# Patient Record
Sex: Male | Born: 1950
Health system: Southern US, Community
[De-identification: ages and names within clinical notes are randomized; demographics above are authoritative.]

## PROBLEM LIST (undated history)

## (undated) DIAGNOSIS — M5416 Radiculopathy, lumbar region: Secondary | ICD-10-CM

## (undated) DIAGNOSIS — G2581 Restless legs syndrome: Secondary | ICD-10-CM

## (undated) DIAGNOSIS — R0600 Dyspnea, unspecified: Secondary | ICD-10-CM

## (undated) DIAGNOSIS — R519 Headache, unspecified: Secondary | ICD-10-CM

## (undated) DIAGNOSIS — R5383 Other fatigue: Secondary | ICD-10-CM

## (undated) DIAGNOSIS — M79659 Pain in unspecified thigh: Secondary | ICD-10-CM

## (undated) DIAGNOSIS — K219 Gastro-esophageal reflux disease without esophagitis: Secondary | ICD-10-CM

## (undated) DIAGNOSIS — J42 Unspecified chronic bronchitis: Secondary | ICD-10-CM

## (undated) DIAGNOSIS — E119 Type 2 diabetes mellitus without complications: Secondary | ICD-10-CM

## (undated) DIAGNOSIS — N419 Inflammatory disease of prostate, unspecified: Secondary | ICD-10-CM

## (undated) DIAGNOSIS — R42 Dizziness and giddiness: Secondary | ICD-10-CM

## (undated) DIAGNOSIS — M7500 Adhesive capsulitis of unspecified shoulder: Secondary | ICD-10-CM

## (undated) DIAGNOSIS — I10 Essential (primary) hypertension: Secondary | ICD-10-CM

## (undated) DIAGNOSIS — J449 Chronic obstructive pulmonary disease, unspecified: Secondary | ICD-10-CM

## (undated) DIAGNOSIS — Z720 Tobacco use: Secondary | ICD-10-CM

## (undated) DIAGNOSIS — C801 Malignant (primary) neoplasm, unspecified: Secondary | ICD-10-CM

## (undated) DIAGNOSIS — S060XAA Concussion with loss of consciousness status unknown, initial encounter: Secondary | ICD-10-CM

## (undated) DIAGNOSIS — S060X9A Concussion with loss of consciousness of unspecified duration, initial encounter: Secondary | ICD-10-CM

## (undated) DIAGNOSIS — J439 Emphysema, unspecified: Secondary | ICD-10-CM

## (undated) DIAGNOSIS — H9190 Unspecified hearing loss, unspecified ear: Secondary | ICD-10-CM

## (undated) DIAGNOSIS — Z972 Presence of dental prosthetic device (complete) (partial): Secondary | ICD-10-CM

## (undated) DIAGNOSIS — M1990 Unspecified osteoarthritis, unspecified site: Secondary | ICD-10-CM

## (undated) DIAGNOSIS — N486 Induration penis plastica: Secondary | ICD-10-CM

## (undated) HISTORY — DX: Unspecified chronic bronchitis: J42

## (undated) HISTORY — DX: Inflammatory disease of prostate, unspecified: N41.9

## (undated) HISTORY — DX: Type 2 diabetes mellitus without complications: E11.9

## (undated) HISTORY — DX: Restless legs syndrome: G25.81

## (undated) HISTORY — DX: Tobacco use: Z72.0

## (undated) HISTORY — DX: Concussion with loss of consciousness status unknown, initial encounter: S06.0XAA

## (undated) HISTORY — DX: Radiculopathy, lumbar region: M54.16

## (undated) HISTORY — DX: Concussion with loss of consciousness of unspecified duration, initial encounter: S06.0X9A

## (undated) HISTORY — DX: Chronic obstructive pulmonary disease, unspecified: J44.9

## (undated) HISTORY — DX: Induration penis plastica: N48.6

## (undated) HISTORY — PX: TONSILLECTOMY: SUR1361

## (undated) HISTORY — DX: Malignant (primary) neoplasm, unspecified: C80.1

## (undated) HISTORY — DX: Pain in unspecified thigh: M79.659

## (undated) HISTORY — PX: COLONOSCOPY: SHX174

## (undated) HISTORY — DX: Gastro-esophageal reflux disease without esophagitis: K21.9

## (undated) HISTORY — PX: EYE SURGERY: SHX253

---

## 1998-12-14 ENCOUNTER — Encounter: Admission: RE | Admit: 1998-12-14 | Discharge: 1998-12-14 | Payer: Self-pay | Admitting: Orthopedic Surgery

## 1998-12-14 ENCOUNTER — Encounter: Payer: Self-pay | Admitting: Orthopedic Surgery

## 1999-10-29 ENCOUNTER — Encounter: Payer: Self-pay | Admitting: Orthopedic Surgery

## 1999-10-29 ENCOUNTER — Encounter: Admission: RE | Admit: 1999-10-29 | Discharge: 1999-10-29 | Payer: Self-pay | Admitting: Orthopedic Surgery

## 2000-12-15 ENCOUNTER — Encounter: Payer: Self-pay | Admitting: *Deleted

## 2000-12-15 ENCOUNTER — Encounter: Admission: RE | Admit: 2000-12-15 | Discharge: 2000-12-15 | Payer: Self-pay | Admitting: *Deleted

## 2003-12-14 ENCOUNTER — Encounter: Admission: RE | Admit: 2003-12-14 | Discharge: 2003-12-14 | Payer: Self-pay | Admitting: Internal Medicine

## 2007-03-17 ENCOUNTER — Encounter: Admission: RE | Admit: 2007-03-17 | Discharge: 2007-03-17 | Payer: Self-pay | Admitting: Internal Medicine

## 2007-06-04 ENCOUNTER — Ambulatory Visit (HOSPITAL_COMMUNITY): Admission: RE | Admit: 2007-06-04 | Discharge: 2007-06-04 | Payer: Self-pay | Admitting: Gastroenterology

## 2007-06-04 ENCOUNTER — Encounter (INDEPENDENT_AMBULATORY_CARE_PROVIDER_SITE_OTHER): Payer: Self-pay | Admitting: Gastroenterology

## 2008-01-15 DIAGNOSIS — Z720 Tobacco use: Secondary | ICD-10-CM

## 2008-01-15 HISTORY — DX: Tobacco use: Z72.0

## 2009-01-14 DIAGNOSIS — N419 Inflammatory disease of prostate, unspecified: Secondary | ICD-10-CM

## 2009-01-14 HISTORY — DX: Inflammatory disease of prostate, unspecified: N41.9

## 2011-01-15 DIAGNOSIS — M5416 Radiculopathy, lumbar region: Secondary | ICD-10-CM

## 2011-01-15 HISTORY — DX: Radiculopathy, lumbar region: M54.16

## 2011-10-15 DIAGNOSIS — M79659 Pain in unspecified thigh: Secondary | ICD-10-CM

## 2011-10-15 HISTORY — DX: Pain in unspecified thigh: M79.659

## 2012-04-24 ENCOUNTER — Other Ambulatory Visit: Payer: Self-pay | Admitting: Orthopaedic Surgery

## 2012-04-24 DIAGNOSIS — M545 Low back pain, unspecified: Secondary | ICD-10-CM

## 2012-05-04 ENCOUNTER — Ambulatory Visit
Admission: RE | Admit: 2012-05-04 | Discharge: 2012-05-04 | Disposition: A | Discharge status: Home / Self Care | Payer: BC Managed Care – PPO | Source: Ambulatory Visit | Attending: Orthopaedic Surgery | Admitting: Orthopaedic Surgery

## 2012-05-04 DIAGNOSIS — M545 Low back pain, unspecified: Secondary | ICD-10-CM

## 2012-05-14 ENCOUNTER — Other Ambulatory Visit: Payer: Self-pay | Admitting: Orthopaedic Surgery

## 2012-05-14 DIAGNOSIS — R9389 Abnormal findings on diagnostic imaging of other specified body structures: Secondary | ICD-10-CM

## 2012-05-15 ENCOUNTER — Other Ambulatory Visit: Payer: Self-pay | Admitting: *Deleted

## 2012-05-15 ENCOUNTER — Encounter: Payer: Self-pay | Admitting: Vascular Surgery

## 2012-05-15 DIAGNOSIS — M79609 Pain in unspecified limb: Secondary | ICD-10-CM

## 2012-05-18 ENCOUNTER — Ambulatory Visit
Admission: RE | Admit: 2012-05-18 | Discharge: 2012-05-18 | Disposition: A | Discharge status: Home / Self Care | Payer: BC Managed Care – PPO | Source: Ambulatory Visit | Attending: Orthopaedic Surgery | Admitting: Orthopaedic Surgery

## 2012-05-18 DIAGNOSIS — R9389 Abnormal findings on diagnostic imaging of other specified body structures: Secondary | ICD-10-CM

## 2012-05-28 ENCOUNTER — Encounter: Payer: BC Managed Care – PPO | Admitting: Vascular Surgery

## 2012-06-26 ENCOUNTER — Encounter: Payer: BC Managed Care – PPO | Admitting: Vascular Surgery

## 2012-07-10 ENCOUNTER — Encounter: Payer: Self-pay | Admitting: Surgery

## 2012-07-13 ENCOUNTER — Encounter (INDEPENDENT_AMBULATORY_CARE_PROVIDER_SITE_OTHER): Payer: BC Managed Care – PPO | Admitting: *Deleted

## 2012-07-13 ENCOUNTER — Ambulatory Visit (INDEPENDENT_AMBULATORY_CARE_PROVIDER_SITE_OTHER): Payer: BC Managed Care – PPO | Admitting: Surgery

## 2012-07-13 ENCOUNTER — Encounter: Payer: Self-pay | Admitting: Surgery

## 2012-07-13 VITALS — BP 135/88 | HR 75 | Resp 16 | Ht 66.0 in | Wt 142.0 lb

## 2012-07-13 DIAGNOSIS — R202 Paresthesia of skin: Secondary | ICD-10-CM

## 2012-07-13 DIAGNOSIS — R209 Unspecified disturbances of skin sensation: Secondary | ICD-10-CM

## 2012-07-13 DIAGNOSIS — I70219 Atherosclerosis of native arteries of extremities with intermittent claudication, unspecified extremity: Secondary | ICD-10-CM | POA: Insufficient documentation

## 2012-07-13 DIAGNOSIS — M79609 Pain in unspecified limb: Secondary | ICD-10-CM | POA: Insufficient documentation

## 2012-07-13 NOTE — Progress Notes (Signed)
[facility] [place]   Patient name: Luis [name] MRN: [id number] DOB: 1950/04/18 Sex: male   Referred by: Dr. Durward Fortes  Reason for referral:  Chief Complaint  Patient presents with  . New Evaluation    Bilateral Thigh pain Right > Left, duration 3 yrs.  Pt. has prussure and tingling bilateral legs, duration 3 yrs.    HISTORY OF PRESENT ILLNESS: The patient comes in today for evaluation of bilateral thigh pain, right greater than left. This has been going on for the past 3 years. The patient states that the pain is worse when he is getting out of a car. It does get better after he walks around. He also complains of bilateral groin pain which comes and goes. He will also get cramps at night. He does not endorse cramps with activity.  The patient does not have open wounds on his legs. He has a history of smoking which has caused emphysema. He quit 3 years ago.  Past Medical History  Diagnosis Date  . Thigh pain     Bilateral Thigh  . COPD (chronic obstructive pulmonary disease)     Past Surgical History  Procedure Laterality Date  . Tonsillectomy      History   Social History  . Marital Status: Married    Spouse Name: N/A    Number of Children: N/A  . Years of Education: N/A   Occupational History  . Not on file.   Social History Main Topics  . Smoking status: Former Smoker    Quit date: 01/14/2009  . Smokeless tobacco: Never Used  . Alcohol Use: Yes  . Drug Use: No  . Sexually Active: Not on file   Other Topics Concern  . Not on file   Social History Narrative  . No narrative on file    Family History  Problem Relation Age of Onset  . Diabetes Mother   . Hyperlipidemia Father   . Hypertension Father     Allergies as of 07/13/2012  . (Not on File)    No current outpatient prescriptions on file prior to visit.   No current facility-administered medications on file prior to visit.     REVIEW OF SYSTEMS: Cardiovascular: No  chest pain, chest pressure, palpitations, orthopnea, or dyspnea on exertion. No claudication or rest pain,  No history of DVT or phlebitis. Pulmonary: No productive cough, asthma or wheezing. Neurologic: No weakness, paresthesias, aphasia, or amaurosis. No dizziness. Hematologic: No bleeding problems or clotting disorders. Musculoskeletal: No joint pain or joint swelling. Gastrointestinal: No blood in stool or hematemesis Genitourinary: No dysuria or hematuria. Psychiatric:: No history of major depression. Integumentary: No rashes or ulcers. Constitutional: No fever or chills.  PHYSICAL EXAMINATION: General: The patient appears their stated age.  Vital signs are BP 135/88  Pulse 75  Resp 16  Ht 5\' 6"  (1.676 m)  Wt 142 lb (64.411 kg)  BMI 22.93 kg/m2  SpO2 97% HEENT:  No gross abnormalities Pulmonary: Respirations are non-labored Abdomen: Soft and non-tender. I do not feel an aortic aneurysm Musculoskeletal: There are no major deformities.   Neurologic: No focal weakness or paresthesias are detected, Skin: There are no ulcer or rashes noted. Psychiatric: The patient has normal affect. Cardiovascular: There is a regular rate and rhythm without significant murmur appreciated. No carotid bruits. Palpable bilateral pedal and bilateral femoral pulses  Diagnostic Studies: ABIs were performed today which showed greater than 1 bilaterally with triphasic waveforms   Assessment:  Bilateral thigh pain  Plan: I discussed with the patient that his symptoms do not correlate with findings from arterial insufficiency. In addition he has palpable pedal pulses as well as ankle-brachial indices which are normal. I told him that I do not feel that his symptoms are related to arterial insufficiency. He will followup with Dr. Durward Fortes to further evaluate etiology of his thigh complaints.     Eldridge Abrahams, M.D. [facility] [place] Office: [phone] Pager:   [phone]

## 2013-02-18 ENCOUNTER — Encounter: Payer: Self-pay | Admitting: Cardiology

## 2014-01-12 ENCOUNTER — Other Ambulatory Visit (INDEPENDENT_AMBULATORY_CARE_PROVIDER_SITE_OTHER): Payer: Self-pay

## 2014-01-12 DIAGNOSIS — G8929 Other chronic pain: Secondary | ICD-10-CM

## 2014-01-12 DIAGNOSIS — R1031 Right lower quadrant pain: Principal | ICD-10-CM

## 2014-01-17 ENCOUNTER — Ambulatory Visit
Admission: RE | Admit: 2014-01-17 | Discharge: 2014-01-17 | Disposition: A | Discharge status: Home / Self Care | Payer: BC Managed Care – PPO | Source: Ambulatory Visit | Attending: Surgery | Admitting: Surgery

## 2014-01-17 DIAGNOSIS — R1031 Right lower quadrant pain: Principal | ICD-10-CM

## 2014-01-17 DIAGNOSIS — G8929 Other chronic pain: Secondary | ICD-10-CM

## 2014-01-17 MED ORDER — IOHEXOL 300 MG/ML  SOLN
100.0000 mL | Freq: Once | INTRAMUSCULAR | Status: AC | PRN
  Administered 2014-01-17: 100 mL via INTRAVENOUS

## 2015-05-24 DIAGNOSIS — S61402A Unspecified open wound of left hand, initial encounter: Secondary | ICD-10-CM | POA: Diagnosis not present

## 2015-05-24 DIAGNOSIS — Z23 Encounter for immunization: Secondary | ICD-10-CM | POA: Diagnosis not present

## 2015-08-31 DIAGNOSIS — Z0000 Encounter for general adult medical examination without abnormal findings: Secondary | ICD-10-CM | POA: Diagnosis not present

## 2015-08-31 DIAGNOSIS — R634 Abnormal weight loss: Secondary | ICD-10-CM | POA: Diagnosis not present

## 2015-08-31 DIAGNOSIS — F17200 Nicotine dependence, unspecified, uncomplicated: Secondary | ICD-10-CM | POA: Diagnosis not present

## 2015-08-31 DIAGNOSIS — R197 Diarrhea, unspecified: Secondary | ICD-10-CM | POA: Diagnosis not present

## 2015-08-31 DIAGNOSIS — M79659 Pain in unspecified thigh: Secondary | ICD-10-CM | POA: Diagnosis not present

## 2015-08-31 DIAGNOSIS — J449 Chronic obstructive pulmonary disease, unspecified: Secondary | ICD-10-CM | POA: Diagnosis not present

## 2015-08-31 DIAGNOSIS — Z125 Encounter for screening for malignant neoplasm of prostate: Secondary | ICD-10-CM | POA: Diagnosis not present

## 2015-08-31 DIAGNOSIS — I739 Peripheral vascular disease, unspecified: Secondary | ICD-10-CM | POA: Diagnosis not present

## 2015-08-31 DIAGNOSIS — Z23 Encounter for immunization: Secondary | ICD-10-CM | POA: Diagnosis not present

## 2016-03-15 DIAGNOSIS — S30861A Insect bite (nonvenomous) of abdominal wall, initial encounter: Secondary | ICD-10-CM | POA: Diagnosis not present

## 2016-03-15 DIAGNOSIS — H9202 Otalgia, left ear: Secondary | ICD-10-CM | POA: Diagnosis not present

## 2016-06-28 DIAGNOSIS — J029 Acute pharyngitis, unspecified: Secondary | ICD-10-CM | POA: Diagnosis not present

## 2016-06-28 DIAGNOSIS — J0100 Acute maxillary sinusitis, unspecified: Secondary | ICD-10-CM | POA: Diagnosis not present

## 2016-07-05 DIAGNOSIS — J441 Chronic obstructive pulmonary disease with (acute) exacerbation: Secondary | ICD-10-CM | POA: Diagnosis not present

## 2016-07-16 ENCOUNTER — Ambulatory Visit
Admission: RE | Admit: 2016-07-16 | Discharge: 2016-07-16 | Disposition: A | Discharge status: Home / Self Care | Payer: BLUE CROSS/BLUE SHIELD | Source: Ambulatory Visit | Attending: Internal Medicine | Admitting: Internal Medicine

## 2016-07-16 ENCOUNTER — Other Ambulatory Visit: Payer: Self-pay | Admitting: Internal Medicine

## 2016-07-16 DIAGNOSIS — R05 Cough: Secondary | ICD-10-CM

## 2016-07-16 DIAGNOSIS — R432 Parageusia: Secondary | ICD-10-CM | POA: Diagnosis not present

## 2016-07-16 DIAGNOSIS — R630 Anorexia: Secondary | ICD-10-CM | POA: Diagnosis not present

## 2016-07-16 DIAGNOSIS — J90 Pleural effusion, not elsewhere classified: Secondary | ICD-10-CM | POA: Diagnosis not present

## 2016-07-16 DIAGNOSIS — R059 Cough, unspecified: Secondary | ICD-10-CM

## 2016-07-16 DIAGNOSIS — R61 Generalized hyperhidrosis: Secondary | ICD-10-CM | POA: Diagnosis not present

## 2016-07-16 DIAGNOSIS — R5383 Other fatigue: Secondary | ICD-10-CM | POA: Diagnosis not present

## 2016-07-16 DIAGNOSIS — R300 Dysuria: Secondary | ICD-10-CM | POA: Diagnosis not present

## 2016-07-16 DIAGNOSIS — R634 Abnormal weight loss: Secondary | ICD-10-CM | POA: Diagnosis not present

## 2016-07-24 ENCOUNTER — Other Ambulatory Visit: Payer: Self-pay | Admitting: Nurse Practitioner

## 2016-07-24 DIAGNOSIS — R634 Abnormal weight loss: Secondary | ICD-10-CM

## 2016-07-24 DIAGNOSIS — R5383 Other fatigue: Secondary | ICD-10-CM | POA: Diagnosis not present

## 2016-07-24 DIAGNOSIS — R630 Anorexia: Secondary | ICD-10-CM | POA: Diagnosis not present

## 2016-08-02 ENCOUNTER — Ambulatory Visit
Admission: RE | Admit: 2016-08-02 | Discharge: 2016-08-02 | Disposition: A | Discharge status: Home / Self Care | Payer: BLUE CROSS/BLUE SHIELD | Source: Ambulatory Visit | Attending: Nurse Practitioner | Admitting: Nurse Practitioner

## 2016-08-02 DIAGNOSIS — R634 Abnormal weight loss: Secondary | ICD-10-CM | POA: Diagnosis not present

## 2017-04-10 DIAGNOSIS — J069 Acute upper respiratory infection, unspecified: Secondary | ICD-10-CM | POA: Diagnosis not present

## 2017-08-19 DIAGNOSIS — Z0000 Encounter for general adult medical examination without abnormal findings: Secondary | ICD-10-CM | POA: Diagnosis not present

## 2017-08-19 DIAGNOSIS — R030 Elevated blood-pressure reading, without diagnosis of hypertension: Secondary | ICD-10-CM | POA: Diagnosis not present

## 2017-12-03 DIAGNOSIS — Z23 Encounter for immunization: Secondary | ICD-10-CM | POA: Diagnosis not present

## 2018-04-06 DIAGNOSIS — Z23 Encounter for immunization: Secondary | ICD-10-CM | POA: Diagnosis not present

## 2018-04-06 DIAGNOSIS — Z136 Encounter for screening for cardiovascular disorders: Secondary | ICD-10-CM | POA: Diagnosis not present

## 2018-04-06 DIAGNOSIS — J449 Chronic obstructive pulmonary disease, unspecified: Secondary | ICD-10-CM | POA: Diagnosis not present

## 2018-04-06 DIAGNOSIS — Z1211 Encounter for screening for malignant neoplasm of colon: Secondary | ICD-10-CM | POA: Diagnosis not present

## 2018-04-06 DIAGNOSIS — R634 Abnormal weight loss: Secondary | ICD-10-CM | POA: Diagnosis not present

## 2018-04-06 DIAGNOSIS — I739 Peripheral vascular disease, unspecified: Secondary | ICD-10-CM | POA: Diagnosis not present

## 2018-04-06 DIAGNOSIS — F17200 Nicotine dependence, unspecified, uncomplicated: Secondary | ICD-10-CM | POA: Diagnosis not present

## 2018-04-06 DIAGNOSIS — G2581 Restless legs syndrome: Secondary | ICD-10-CM | POA: Diagnosis not present

## 2018-04-06 DIAGNOSIS — Z13220 Encounter for screening for lipoid disorders: Secondary | ICD-10-CM | POA: Diagnosis not present

## 2018-04-06 DIAGNOSIS — Z125 Encounter for screening for malignant neoplasm of prostate: Secondary | ICD-10-CM | POA: Diagnosis not present

## 2018-04-06 DIAGNOSIS — Z0000 Encounter for general adult medical examination without abnormal findings: Secondary | ICD-10-CM | POA: Diagnosis not present

## 2018-05-22 DIAGNOSIS — H2513 Age-related nuclear cataract, bilateral: Secondary | ICD-10-CM | POA: Diagnosis not present

## 2018-06-13 DIAGNOSIS — B029 Zoster without complications: Secondary | ICD-10-CM

## 2018-06-13 HISTORY — DX: Zoster without complications: B02.9

## 2018-06-25 DIAGNOSIS — H2511 Age-related nuclear cataract, right eye: Secondary | ICD-10-CM | POA: Diagnosis not present

## 2018-06-25 DIAGNOSIS — J449 Chronic obstructive pulmonary disease, unspecified: Secondary | ICD-10-CM | POA: Diagnosis not present

## 2018-07-02 ENCOUNTER — Other Ambulatory Visit: Payer: Self-pay

## 2018-07-02 NOTE — Discharge Instructions (Signed)

## 2018-07-03 ENCOUNTER — Other Ambulatory Visit: Payer: Self-pay

## 2018-07-03 ENCOUNTER — Other Ambulatory Visit
Admission: RE | Admit: 2018-07-03 | Discharge: 2018-07-03 | Disposition: A | Discharge status: Home / Self Care | Payer: Medicare Other | Source: Ambulatory Visit | Attending: Ophthalmology | Admitting: Ophthalmology

## 2018-07-03 DIAGNOSIS — Z1159 Encounter for screening for other viral diseases: Secondary | ICD-10-CM | POA: Diagnosis not present

## 2018-07-04 LAB — NOVEL CORONAVIRUS, NAA (HOSP ORDER, SEND-OUT TO REF LAB; TAT 18-24 HRS): SARS-CoV-2, NAA: NOT DETECTED

## 2018-07-08 ENCOUNTER — Ambulatory Visit
Admission: RE | Admit: 2018-07-08 | Discharge: 2018-07-08 | Disposition: A | Discharge status: Home / Self Care | Payer: Medicare Other | Attending: Ophthalmology | Admitting: Ophthalmology

## 2018-07-08 ENCOUNTER — Ambulatory Visit: Payer: Medicare Other | Admitting: Anesthesiology

## 2018-07-08 ENCOUNTER — Encounter
Admission: RE | Disposition: A | Discharge status: Home / Self Care | Payer: Self-pay | Source: Home / Self Care | Attending: Ophthalmology

## 2018-07-08 DIAGNOSIS — Z87891 Personal history of nicotine dependence: Secondary | ICD-10-CM | POA: Insufficient documentation

## 2018-07-08 DIAGNOSIS — J439 Emphysema, unspecified: Secondary | ICD-10-CM | POA: Diagnosis not present

## 2018-07-08 DIAGNOSIS — H2511 Age-related nuclear cataract, right eye: Secondary | ICD-10-CM | POA: Insufficient documentation

## 2018-07-08 DIAGNOSIS — H25811 Combined forms of age-related cataract, right eye: Secondary | ICD-10-CM | POA: Diagnosis not present

## 2018-07-08 HISTORY — DX: Headache, unspecified: R51.9

## 2018-07-08 HISTORY — PX: CATARACT EXTRACTION W/PHACO: SHX586

## 2018-07-08 HISTORY — DX: Unspecified osteoarthritis, unspecified site: M19.90

## 2018-07-08 HISTORY — DX: Presence of dental prosthetic device (complete) (partial): Z97.2

## 2018-07-08 SURGERY — PHACOEMULSIFICATION, CATARACT, WITH IOL INSERTION
Anesthesia: Monitor Anesthesia Care | Site: Eye | Laterality: Right

## 2018-07-08 MED ORDER — TETRACAINE HCL 0.5 % OP SOLN
1.0000 [drp] | OPHTHALMIC | Status: DC | PRN
  Administered 2018-07-08 (×3): 1 [drp] via OPHTHALMIC

## 2018-07-08 MED ORDER — MOXIFLOXACIN HCL 0.5 % OP SOLN
1.0000 [drp] | OPHTHALMIC | Status: DC | PRN
  Administered 2018-07-08 (×3): 1 [drp] via OPHTHALMIC

## 2018-07-08 MED ORDER — EPINEPHRINE PF 1 MG/ML IJ SOLN
INTRAOCULAR | Status: DC | PRN
  Administered 2018-07-08: 63 mL via OPHTHALMIC

## 2018-07-08 MED ORDER — CEFUROXIME OPHTHALMIC INJECTION 1 MG/0.1 ML
INJECTION | OPHTHALMIC | Status: DC | PRN
  Administered 2018-07-08: 0.1 mL via INTRACAMERAL

## 2018-07-08 MED ORDER — LACTATED RINGERS IV SOLN: INTRAVENOUS | Status: DC

## 2018-07-08 MED ORDER — NA HYALUR & NA CHOND-NA HYALUR 0.4-0.35 ML IO KIT
PACK | INTRAOCULAR | Status: DC | PRN
  Administered 2018-07-08: 1 mL via INTRAOCULAR

## 2018-07-08 MED ORDER — FENTANYL CITRATE (PF) 100 MCG/2ML IJ SOLN
INTRAMUSCULAR | Status: DC | PRN
  Administered 2018-07-08: 50 ug via INTRAVENOUS

## 2018-07-08 MED ORDER — MIDAZOLAM HCL 2 MG/2ML IJ SOLN
INTRAMUSCULAR | Status: DC | PRN
  Administered 2018-07-08: 2 mg via INTRAVENOUS

## 2018-07-08 MED ORDER — ARMC OPHTHALMIC DILATING DROPS
1.0000 "application " | OPHTHALMIC | Status: DC | PRN
  Administered 2018-07-08 (×3): 1 via OPHTHALMIC

## 2018-07-08 MED ORDER — BRIMONIDINE TARTRATE-TIMOLOL 0.2-0.5 % OP SOLN
OPHTHALMIC | Status: DC | PRN
  Administered 2018-07-08: 1 [drp] via OPHTHALMIC

## 2018-07-08 MED ORDER — LIDOCAINE HCL (PF) 2 % IJ SOLN
INTRAOCULAR | Status: DC | PRN
  Administered 2018-07-08: 2 mL

## 2018-07-08 SURGICAL SUPPLY — 21 items
CANNULA ANT/CHMB 27G (MISCELLANEOUS) ×1 IMPLANT
CANNULA ANT/CHMB 27GA (MISCELLANEOUS) ×2 IMPLANT
GLOVE SURG LX 7.5 STRW (GLOVE) ×1
GLOVE SURG LX STRL 7.5 STRW (GLOVE) ×1 IMPLANT
GLOVE SURG TRIUMPH 8.0 PF LTX (GLOVE) ×2 IMPLANT
GOWN STRL REUS W/ TWL LRG LVL3 (GOWN DISPOSABLE) ×2 IMPLANT
GOWN STRL REUS W/TWL LRG LVL3 (GOWN DISPOSABLE) ×2
LENS IOL ACRSF IQ ULTRA 18.5 (Intraocular Lens) IMPLANT
LENS IOL ACRYSOF IQ 18.5 (Intraocular Lens) ×2 IMPLANT
MARKER SKIN DUAL TIP RULER LAB (MISCELLANEOUS) ×2 IMPLANT
NDL FILTER BLUNT 18X1 1/2 (NEEDLE) ×1 IMPLANT
NEEDLE FILTER BLUNT 18X 1/2SAF (NEEDLE) ×1
NEEDLE FILTER BLUNT 18X1 1/2 (NEEDLE) ×1 IMPLANT
PACK CATARACT BRASINGTON (MISCELLANEOUS) ×2 IMPLANT
PACK EYE AFTER SURG (MISCELLANEOUS) ×2 IMPLANT
PACK OPTHALMIC (MISCELLANEOUS) ×2 IMPLANT
SYR 3ML LL SCALE MARK (SYRINGE) ×2 IMPLANT
SYR 5ML LL (SYRINGE) ×2 IMPLANT
SYR TB 1ML LUER SLIP (SYRINGE) ×2 IMPLANT
WATER STERILE IRR 500ML POUR (IV SOLUTION) ×2 IMPLANT
WIPE NON LINTING 3.25X3.25 (MISCELLANEOUS) ×2 IMPLANT

## 2018-07-08 NOTE — Anesthesia Preprocedure Evaluation (Addendum)
Anesthesia Evaluation  Patient identified by MRN, date of birth, ID band Patient awake    Reviewed: Allergy & Precautions, NPO status   Airway Mallampati: II  TM Distance: >3 FB     Dental   Pulmonary COPD, former smoker,    breath sounds clear to auscultation       Cardiovascular negative cardio ROS   Rhythm:Regular Rate:Normal     Neuro/Psych  Headaches, RLS Hx concussion    GI/Hepatic GERD  ,  Endo/Other    Renal/GU      Musculoskeletal  (+) Arthritis ,   Abdominal   Peds  Hematology   Anesthesia Other Findings   Reproductive/Obstetrics                             Anesthesia Physical Anesthesia Plan  ASA: II  Anesthesia Plan: MAC   Post-op Pain Management:    Induction: Intravenous  PONV Risk Score and Plan:   Airway Management Planned: Natural Airway and Nasal Cannula  Additional Equipment:   Intra-op Plan:   Post-operative Plan:   Informed Consent: I have reviewed the patients History and Physical, chart, labs and discussed the procedure including the risks, benefits and alternatives for the proposed anesthesia with the patient or authorized representative who has indicated his/her understanding and acceptance.       Plan Discussed with: CRNA  Anesthesia Plan Comments:        Anesthesia Quick Evaluation

## 2018-07-08 NOTE — Op Note (Signed)
LOCATION:  [place]   PREOPERATIVE DIAGNOSIS:    Nuclear sclerotic cataract right eye. H25.11   POSTOPERATIVE DIAGNOSIS:  Nuclear sclerotic cataract right eye.     PROCEDURE:  Phacoemusification with posterior chamber intraocular lens placement of the right eye   LENS:   Implant Name Type Inv. Item Serial No. Manufacturer Lot No. LRB No. Used Action  LENS IOL ACRYSOF IQ 18.5 - [id number] Intraocular Lens LENS IOL ACRYSOF IQ 18.5 [id number] ALCON  Right 1 Implanted        ULTRASOUND TIME: 10 % of 0 minutes, 58 seconds.  CDE 5.9   SURGEON:  Wyonia Hough, MD   ANESTHESIA:  Topical with tetracaine drops and 2% Xylocaine jelly, augmented with 1% preservative-free intracameral lidocaine.    COMPLICATIONS:  None.   DESCRIPTION OF PROCEDURE:  The patient was identified in the holding room and transported to the operating room and placed in the supine position under the operating microscope.  The right eye was identified as the operative eye and it was prepped and draped in the usual sterile ophthalmic fashion.   A 1 millimeter clear-corneal paracentesis was made at the 12:00 position.  0.5 ml of preservative-free 1% lidocaine was injected into the anterior chamber. The anterior chamber was filled with Viscoat viscoelastic.  A 2.4 millimeter keratome was used to make a near-clear corneal incision at the 9:00 position.  A curvilinear capsulorrhexis was made with a cystotome and capsulorrhexis forceps.  Balanced salt solution was used to hydrodissect and hydrodelineate the nucleus.   Phacoemulsification was then used in stop and chop fashion to remove the lens nucleus and epinucleus.  The remaining cortex was then removed using the irrigation and aspiration handpiece. Provisc was then placed into the capsular bag to distend it for lens placement.  A lens was then injected into the capsular bag.  The remaining viscoelastic was aspirated.   Wounds were hydrated with balanced  salt solution.  The anterior chamber was inflated to a physiologic pressure with balanced salt solution.  No wound leaks were noted. Cefuroxime 0.1 ml of a 10mg /ml solution was injected into the anterior chamber for a dose of 1 mg of intracameral antibiotic at the completion of the case.   Timolol and Brimonidine drops were applied to the eye.  The patient was taken to the recovery room in stable condition without complications of anesthesia or surgery.   Charise Leinbach 07/08/2018, 10:12 AM

## 2018-07-08 NOTE — H&P (Signed)

## 2018-07-08 NOTE — Transfer of Care (Signed)
Immediate Anesthesia Transfer of Care Note  Patient: Luis [name]  Procedure(s) Performed: CATARACT EXTRACTION PHACO AND INTRAOCULAR LENS PLACEMENT (IOC) RIGHT (Right Eye)  Patient Location: PACU  Anesthesia Type: MAC  Level of Consciousness: awake, alert  and patient cooperative  Airway and Oxygen Therapy: Patient Spontanous Breathing and Patient connected to supplemental oxygen  Post-op Assessment: Post-op Vital signs reviewed, Patient's Cardiovascular Status Stable, Respiratory Function Stable, Patent Airway and No signs of Nausea or vomiting  Post-op Vital Signs: Reviewed and stable  Complications: No apparent anesthesia complications

## 2018-07-08 NOTE — Anesthesia Postprocedure Evaluation (Signed)
Anesthesia Post Note  Patient: Luis [name]  Procedure(s) Performed: CATARACT EXTRACTION PHACO AND INTRAOCULAR LENS PLACEMENT (IOC) RIGHT (Right Eye)  Patient location during evaluation: PACU Anesthesia Type: MAC Level of consciousness: awake and alert Pain management: pain level controlled Vital Signs Assessment: post-procedure vital signs reviewed and stable Respiratory status: spontaneous breathing, nonlabored ventilation, respiratory function stable and patient connected to nasal cannula oxygen Cardiovascular status: stable and blood pressure returned to baseline Postop Assessment: no apparent nausea or vomiting Anesthetic complications: no    Veda Canning

## 2018-07-08 NOTE — Anesthesia Procedure Notes (Signed)
Procedure Name: MAC Performed by: Keyara Ent M, CRNA Pre-anesthesia Checklist: Timeout performed, Patient being monitored, Suction available, Emergency Drugs available and Patient identified Patient Re-evaluated:Patient Re-evaluated prior to induction Oxygen Delivery Method: Nasal cannula       

## 2018-07-09 ENCOUNTER — Encounter: Payer: Self-pay | Admitting: Ophthalmology

## 2018-07-16 DIAGNOSIS — H2512 Age-related nuclear cataract, left eye: Secondary | ICD-10-CM | POA: Diagnosis not present

## 2018-07-21 NOTE — Discharge Instructions (Signed)

## 2018-07-23 ENCOUNTER — Encounter: Payer: Self-pay | Admitting: *Deleted

## 2018-07-23 ENCOUNTER — Other Ambulatory Visit: Payer: Self-pay

## 2018-07-24 ENCOUNTER — Other Ambulatory Visit
Admission: RE | Admit: 2018-07-24 | Discharge: 2018-07-24 | Disposition: A | Discharge status: Home / Self Care | Payer: Medicare Other | Source: Ambulatory Visit | Attending: Ophthalmology | Admitting: Ophthalmology

## 2018-07-24 DIAGNOSIS — Z01812 Encounter for preprocedural laboratory examination: Secondary | ICD-10-CM | POA: Diagnosis not present

## 2018-07-24 DIAGNOSIS — Z1159 Encounter for screening for other viral diseases: Secondary | ICD-10-CM | POA: Insufficient documentation

## 2018-07-25 LAB — SARS CORONAVIRUS 2 (TAT 6-24 HRS): SARS Coronavirus 2: NEGATIVE

## 2018-07-29 ENCOUNTER — Ambulatory Visit
Admission: RE | Admit: 2018-07-29 | Discharge: 2018-07-29 | Disposition: A | Discharge status: Home / Self Care | Payer: Medicare Other | Attending: Ophthalmology | Admitting: Ophthalmology

## 2018-07-29 ENCOUNTER — Ambulatory Visit: Payer: Medicare Other | Admitting: Anesthesiology

## 2018-07-29 ENCOUNTER — Encounter
Admission: RE | Disposition: A | Discharge status: Home / Self Care | Payer: Self-pay | Source: Home / Self Care | Attending: Ophthalmology

## 2018-07-29 ENCOUNTER — Other Ambulatory Visit: Payer: Self-pay

## 2018-07-29 DIAGNOSIS — J439 Emphysema, unspecified: Secondary | ICD-10-CM | POA: Diagnosis not present

## 2018-07-29 DIAGNOSIS — R51 Headache: Secondary | ICD-10-CM | POA: Insufficient documentation

## 2018-07-29 DIAGNOSIS — M1990 Unspecified osteoarthritis, unspecified site: Secondary | ICD-10-CM | POA: Diagnosis not present

## 2018-07-29 DIAGNOSIS — H2512 Age-related nuclear cataract, left eye: Secondary | ICD-10-CM | POA: Insufficient documentation

## 2018-07-29 DIAGNOSIS — K219 Gastro-esophageal reflux disease without esophagitis: Secondary | ICD-10-CM | POA: Insufficient documentation

## 2018-07-29 DIAGNOSIS — Z881 Allergy status to other antibiotic agents status: Secondary | ICD-10-CM | POA: Insufficient documentation

## 2018-07-29 DIAGNOSIS — H25812 Combined forms of age-related cataract, left eye: Secondary | ICD-10-CM | POA: Diagnosis not present

## 2018-07-29 DIAGNOSIS — Z87891 Personal history of nicotine dependence: Secondary | ICD-10-CM | POA: Insufficient documentation

## 2018-07-29 DIAGNOSIS — Z8669 Personal history of other diseases of the nervous system and sense organs: Secondary | ICD-10-CM | POA: Insufficient documentation

## 2018-07-29 HISTORY — PX: CATARACT EXTRACTION W/PHACO: SHX586

## 2018-07-29 SURGERY — PHACOEMULSIFICATION, CATARACT, WITH IOL INSERTION
Anesthesia: Topical | Site: Eye | Laterality: Left

## 2018-07-29 MED ORDER — ARMC OPHTHALMIC DILATING DROPS
1.0000 "application " | OPHTHALMIC | Status: DC | PRN
  Administered 2018-07-29 (×3): 1 via OPHTHALMIC

## 2018-07-29 MED ORDER — LIDOCAINE HCL (PF) 2 % IJ SOLN
INTRAOCULAR | Status: DC | PRN
  Administered 2018-07-29: 2 mL

## 2018-07-29 MED ORDER — MOXIFLOXACIN HCL 0.5 % OP SOLN
1.0000 [drp] | OPHTHALMIC | Status: DC | PRN
  Administered 2018-07-29 (×3): 1 [drp] via OPHTHALMIC

## 2018-07-29 MED ORDER — CEFUROXIME OPHTHALMIC INJECTION 1 MG/0.1 ML
INJECTION | OPHTHALMIC | Status: DC | PRN
  Administered 2018-07-29: 0.1 mL via INTRACAMERAL

## 2018-07-29 MED ORDER — TETRACAINE HCL 0.5 % OP SOLN
1.0000 [drp] | OPHTHALMIC | Status: DC | PRN
  Administered 2018-07-29 (×3): 1 [drp] via OPHTHALMIC

## 2018-07-29 MED ORDER — NA HYALUR & NA CHOND-NA HYALUR 0.4-0.35 ML IO KIT
PACK | INTRAOCULAR | Status: DC | PRN
  Administered 2018-07-29: 1 mL via INTRAOCULAR

## 2018-07-29 MED ORDER — MIDAZOLAM HCL 2 MG/2ML IJ SOLN
INTRAMUSCULAR | Status: DC | PRN
  Administered 2018-07-29: 2 mg via INTRAVENOUS

## 2018-07-29 MED ORDER — BRIMONIDINE TARTRATE-TIMOLOL 0.2-0.5 % OP SOLN
OPHTHALMIC | Status: DC | PRN
  Administered 2018-07-29: 1 [drp] via OPHTHALMIC

## 2018-07-29 MED ORDER — FENTANYL CITRATE (PF) 100 MCG/2ML IJ SOLN
INTRAMUSCULAR | Status: DC | PRN
  Administered 2018-07-29 (×2): 50 ug via INTRAVENOUS

## 2018-07-29 MED ORDER — EPINEPHRINE PF 1 MG/ML IJ SOLN
INTRAOCULAR | Status: DC | PRN
  Administered 2018-07-29: 67 mL via OPHTHALMIC

## 2018-07-29 SURGICAL SUPPLY — 19 items
CANNULA ANT/CHMB 27GA (MISCELLANEOUS) ×2 IMPLANT
GLOVE SURG LX 7.5 STRW (GLOVE) ×1
GLOVE SURG LX STRL 7.5 STRW (GLOVE) ×1 IMPLANT
GLOVE SURG TRIUMPH 8.0 PF LTX (GLOVE) ×2 IMPLANT
GOWN STRL REUS W/ TWL LRG LVL3 (GOWN DISPOSABLE) ×2 IMPLANT
GOWN STRL REUS W/TWL LRG LVL3 (GOWN DISPOSABLE) ×2
LENS IOL ACRSF IQ ULTRA 19.0 (Intraocular Lens) ×1 IMPLANT
LENS IOL ACRYSOF IQ 19.0 (Intraocular Lens) ×2 IMPLANT
MARKER SKIN DUAL TIP RULER LAB (MISCELLANEOUS) ×2 IMPLANT
NEEDLE FILTER BLUNT 18X 1/2SAF (NEEDLE) ×1
NEEDLE FILTER BLUNT 18X1 1/2 (NEEDLE) ×1 IMPLANT
PACK CATARACT BRASINGTON (MISCELLANEOUS) ×2 IMPLANT
PACK EYE AFTER SURG (MISCELLANEOUS) ×2 IMPLANT
PACK OPTHALMIC (MISCELLANEOUS) ×2 IMPLANT
SYR 3ML LL SCALE MARK (SYRINGE) ×2 IMPLANT
SYR 5ML LL (SYRINGE) ×2 IMPLANT
SYR TB 1ML LUER SLIP (SYRINGE) ×2 IMPLANT
WATER STERILE IRR 500ML POUR (IV SOLUTION) ×2 IMPLANT
WIPE NON LINTING 3.25X3.25 (MISCELLANEOUS) ×2 IMPLANT

## 2018-07-29 NOTE — H&P (Signed)

## 2018-07-29 NOTE — Anesthesia Preprocedure Evaluation (Signed)
Anesthesia Evaluation  Patient identified by MRN, date of birth, ID band Patient awake    Reviewed: Allergy & Precautions, H&P , NPO status , Patient's Chart, lab work & pertinent test results  History of Anesthesia Complications Negative for: history of anesthetic complications  Airway Mallampati: II   Neck ROM: full    Dental no notable dental hx.    Pulmonary COPD, former smoker,    Pulmonary exam normal breath sounds clear to auscultation       Cardiovascular negative cardio ROS Normal cardiovascular exam Rhythm:regular Rate:Normal     Neuro/Psych  Headaches,    GI/Hepatic Neg liver ROS, GERD  ,  Endo/Other  negative endocrine ROS  Renal/GU negative Renal ROS  negative genitourinary   Musculoskeletal   Abdominal   Peds  Hematology negative hematology ROS (+)   Anesthesia Other Findings   Reproductive/Obstetrics                             Anesthesia Physical Anesthesia Plan  ASA: II  Anesthesia Plan:    Post-op Pain Management:    Induction:   PONV Risk Score and Plan:   Airway Management Planned:   Additional Equipment:   Intra-op Plan:   Post-operative Plan:   Informed Consent: I have reviewed the patients History and Physical, chart, labs and discussed the procedure including the risks, benefits and alternatives for the proposed anesthesia with the patient or authorized representative who has indicated his/her understanding and acceptance.       Plan Discussed with:   Anesthesia Plan Comments:         Anesthesia Quick Evaluation

## 2018-07-29 NOTE — Op Note (Signed)
LOCATION:  [place]   PREOPERATIVE DIAGNOSIS:  Nuclear sclerotic cataract of the left eye.  H25.12  POSTOPERATIVE DIAGNOSIS:  Nuclear sclerotic cataract of the left eye.   PROCEDURE:  Phacoemulsification with Toric posterior chamber intraocular lens placement of the left eye.   LENS:  Implant Name Type Inv. Item Serial No. Manufacturer Lot No. LRB No. Used Action  LENS IOL ACRYSOF IQ 19.0 - [id number] Intraocular Lens LENS IOL ACRYSOF IQ 19.0 [id number] ALCON  Left 1 Implanted   [id number] 19.0 Toric intraocular lens with 1.5 diopters of cylindrical power with axis orientation at 172 degrees.   ULTRASOUND TIME: 10 % of 0 minutes, 51 seconds.  CDE 5.3   SURGEON:  Wyonia Hough, MD   ANESTHESIA:  Topical with tetracaine drops and 2% Xylocaine jelly, augmented with 1% preservative-free intracameral lidocaine.  COMPLICATIONS:  None.   DESCRIPTION OF PROCEDURE:  The patient was identified in the holding room and transported to the operating suite and placed in the supine position under the operating microscope.  The left eye was identified as the operative eye, and it was prepped and draped in the usual sterile ophthalmic fashion.    A clear-corneal paracentesis incision was made at the 1:30 position.  0.5 ml of preservative-free 1% lidocaine was injected into the anterior chamber. The anterior chamber was filled with Viscoat.  A 2.4 millimeter near clear corneal incision was then made at the 10:30 position.  A cystotome and capsulorrhexis forceps were then used to make a curvilinear capsulorrhexis.  Hydrodissection and hydrodelineation were then performed using balanced salt solution.   Phacoemulsification was then used in stop and chop fashion to remove the lens, nucleus and epinucleus.  The remaining cortex was aspirated using the irrigation and aspiration handpiece.  Provisc viscoelastic was then placed into the capsular bag to distend it for lens placement.  The Verion  digital marker was used to align the implant at the intended axis.   A 19.0 diopter lens was then injected into the capsular bag.  It was rotated clockwise until the axis marks on the lens were approximately 15 degrees in the counterclockwise direction to the intended alignment.  The viscoelastic was aspirated from the eye using the irrigation aspiration handpiece.  Then, a Koch spatula through the sideport incision was used to rotate the lens in a clockwise direction until the axis markings of the intraocular lens were lined up with the Verion alignment.  Balanced salt solution was then used to hydrate the wounds. Cefuroxime 0.1 ml of a 10mg /ml solution was injected into the anterior chamber for a dose of 1 mg of intracameral antibiotic at the completion of the case.    The eye was noted to have a physiologic pressure and there was no wound leak noted.   Timolol and Brimonidine drops were applied to the eye.  The patient was taken to the recovery room in stable condition having had no complications of anesthesia or surgery.  Graceland Wachter 07/29/2018, 1:54 PM

## 2018-07-29 NOTE — Transfer of Care (Signed)
Immediate Anesthesia Transfer of Care Note  Patient: Luis [name]  Procedure(s) Performed: CATARACT EXTRACTION PHACO AND INTRAOCULAR LENS PLACEMENT (IOC) LEFT TORIC LENS (Left Eye)  Patient Location: PACU  Anesthesia Type: No value filed.  Level of Consciousness: awake, alert  and patient cooperative  Airway and Oxygen Therapy: Patient Spontanous Breathing and Patient connected to supplemental oxygen  Post-op Assessment: Post-op Vital signs reviewed, Patient's Cardiovascular Status Stable, Respiratory Function Stable, Patent Airway and No signs of Nausea or vomiting  Post-op Vital Signs: Reviewed and stable  Complications: No apparent anesthesia complications

## 2018-07-29 NOTE — Anesthesia Postprocedure Evaluation (Signed)
Anesthesia Post Note  Patient: Luis [name]  Procedure(s) Performed: CATARACT EXTRACTION PHACO AND INTRAOCULAR LENS PLACEMENT (IOC) LEFT TORIC LENS (Left Eye)  Patient location during evaluation: PACU Anesthesia Type: MAC Level of consciousness: awake and alert Pain management: pain level controlled Vital Signs Assessment: post-procedure vital signs reviewed and stable Respiratory status: spontaneous breathing Cardiovascular status: stable Anesthetic complications: no    Jaci Standard, III,  Jaanvi Fizer D

## 2018-07-30 ENCOUNTER — Encounter: Payer: Self-pay | Admitting: Ophthalmology

## 2018-09-02 DIAGNOSIS — M7502 Adhesive capsulitis of left shoulder: Secondary | ICD-10-CM | POA: Diagnosis not present

## 2018-09-08 DIAGNOSIS — M7502 Adhesive capsulitis of left shoulder: Secondary | ICD-10-CM | POA: Diagnosis not present

## 2018-09-22 DIAGNOSIS — M7502 Adhesive capsulitis of left shoulder: Secondary | ICD-10-CM | POA: Diagnosis not present

## 2018-09-25 DIAGNOSIS — M7502 Adhesive capsulitis of left shoulder: Secondary | ICD-10-CM | POA: Diagnosis not present

## 2018-10-05 DIAGNOSIS — M7502 Adhesive capsulitis of left shoulder: Secondary | ICD-10-CM | POA: Diagnosis not present

## 2018-10-07 DIAGNOSIS — M7502 Adhesive capsulitis of left shoulder: Secondary | ICD-10-CM | POA: Diagnosis not present

## 2018-10-12 DIAGNOSIS — M7502 Adhesive capsulitis of left shoulder: Secondary | ICD-10-CM | POA: Diagnosis not present

## 2018-10-14 DIAGNOSIS — M7502 Adhesive capsulitis of left shoulder: Secondary | ICD-10-CM | POA: Diagnosis not present

## 2018-11-09 DIAGNOSIS — Z23 Encounter for immunization: Secondary | ICD-10-CM | POA: Diagnosis not present

## 2019-04-01 DIAGNOSIS — Z1211 Encounter for screening for malignant neoplasm of colon: Secondary | ICD-10-CM | POA: Diagnosis not present

## 2019-04-01 DIAGNOSIS — R109 Unspecified abdominal pain: Secondary | ICD-10-CM | POA: Diagnosis not present

## 2019-04-01 DIAGNOSIS — R634 Abnormal weight loss: Secondary | ICD-10-CM | POA: Diagnosis not present

## 2019-04-01 DIAGNOSIS — Z79899 Other long term (current) drug therapy: Secondary | ICD-10-CM | POA: Diagnosis not present

## 2019-04-01 DIAGNOSIS — R972 Elevated prostate specific antigen [PSA]: Secondary | ICD-10-CM | POA: Diagnosis not present

## 2019-04-01 DIAGNOSIS — R05 Cough: Secondary | ICD-10-CM | POA: Diagnosis not present

## 2019-04-01 DIAGNOSIS — J029 Acute pharyngitis, unspecified: Secondary | ICD-10-CM | POA: Diagnosis not present

## 2019-04-01 DIAGNOSIS — J449 Chronic obstructive pulmonary disease, unspecified: Secondary | ICD-10-CM | POA: Diagnosis not present

## 2019-04-01 DIAGNOSIS — R519 Headache, unspecified: Secondary | ICD-10-CM | POA: Diagnosis not present

## 2019-04-01 DIAGNOSIS — I739 Peripheral vascular disease, unspecified: Secondary | ICD-10-CM | POA: Diagnosis not present

## 2019-04-01 DIAGNOSIS — F17201 Nicotine dependence, unspecified, in remission: Secondary | ICD-10-CM | POA: Diagnosis not present

## 2019-04-15 DIAGNOSIS — C801 Malignant (primary) neoplasm, unspecified: Secondary | ICD-10-CM

## 2019-04-15 HISTORY — DX: Malignant (primary) neoplasm, unspecified: C80.1

## 2019-04-21 ENCOUNTER — Other Ambulatory Visit: Payer: Self-pay | Admitting: Internal Medicine

## 2019-04-21 DIAGNOSIS — R519 Headache, unspecified: Secondary | ICD-10-CM

## 2019-04-21 DIAGNOSIS — F17201 Nicotine dependence, unspecified, in remission: Secondary | ICD-10-CM

## 2019-04-21 DIAGNOSIS — G44009 Cluster headache syndrome, unspecified, not intractable: Secondary | ICD-10-CM

## 2019-04-21 DIAGNOSIS — G8929 Other chronic pain: Secondary | ICD-10-CM

## 2019-04-26 ENCOUNTER — Ambulatory Visit
Admission: RE | Admit: 2019-04-26 | Discharge: 2019-04-26 | Disposition: A | Discharge status: Home / Self Care | Payer: Medicare Other | Source: Ambulatory Visit | Attending: Internal Medicine | Admitting: Internal Medicine

## 2019-04-26 DIAGNOSIS — R972 Elevated prostate specific antigen [PSA]: Secondary | ICD-10-CM | POA: Diagnosis not present

## 2019-04-26 DIAGNOSIS — R519 Headache, unspecified: Secondary | ICD-10-CM

## 2019-04-26 DIAGNOSIS — Z0001 Encounter for general adult medical examination with abnormal findings: Secondary | ICD-10-CM | POA: Diagnosis not present

## 2019-04-26 DIAGNOSIS — I739 Peripheral vascular disease, unspecified: Secondary | ICD-10-CM | POA: Diagnosis not present

## 2019-04-26 DIAGNOSIS — R634 Abnormal weight loss: Secondary | ICD-10-CM | POA: Diagnosis not present

## 2019-04-26 DIAGNOSIS — G8929 Other chronic pain: Secondary | ICD-10-CM

## 2019-04-26 DIAGNOSIS — R05 Cough: Secondary | ICD-10-CM | POA: Diagnosis not present

## 2019-04-26 DIAGNOSIS — Z1211 Encounter for screening for malignant neoplasm of colon: Secondary | ICD-10-CM | POA: Diagnosis not present

## 2019-04-26 DIAGNOSIS — Z1389 Encounter for screening for other disorder: Secondary | ICD-10-CM | POA: Diagnosis not present

## 2019-04-26 DIAGNOSIS — Z8042 Family history of malignant neoplasm of prostate: Secondary | ICD-10-CM | POA: Diagnosis not present

## 2019-04-26 DIAGNOSIS — Z87891 Personal history of nicotine dependence: Secondary | ICD-10-CM | POA: Diagnosis not present

## 2019-04-26 DIAGNOSIS — J449 Chronic obstructive pulmonary disease, unspecified: Secondary | ICD-10-CM | POA: Diagnosis not present

## 2019-04-26 DIAGNOSIS — F17201 Nicotine dependence, unspecified, in remission: Secondary | ICD-10-CM

## 2019-05-03 DIAGNOSIS — R634 Abnormal weight loss: Secondary | ICD-10-CM | POA: Diagnosis not present

## 2019-05-03 DIAGNOSIS — J449 Chronic obstructive pulmonary disease, unspecified: Secondary | ICD-10-CM | POA: Diagnosis not present

## 2019-05-03 DIAGNOSIS — Z8042 Family history of malignant neoplasm of prostate: Secondary | ICD-10-CM | POA: Diagnosis not present

## 2019-05-03 DIAGNOSIS — R05 Cough: Secondary | ICD-10-CM | POA: Diagnosis not present

## 2019-05-03 DIAGNOSIS — I700 Atherosclerosis of aorta: Secondary | ICD-10-CM | POA: Diagnosis not present

## 2019-05-03 DIAGNOSIS — R972 Elevated prostate specific antigen [PSA]: Secondary | ICD-10-CM | POA: Diagnosis not present

## 2019-05-03 DIAGNOSIS — R911 Solitary pulmonary nodule: Secondary | ICD-10-CM | POA: Diagnosis not present

## 2019-05-03 DIAGNOSIS — R519 Headache, unspecified: Secondary | ICD-10-CM | POA: Diagnosis not present

## 2019-05-03 DIAGNOSIS — F17201 Nicotine dependence, unspecified, in remission: Secondary | ICD-10-CM | POA: Diagnosis not present

## 2019-05-05 ENCOUNTER — Ambulatory Visit: Payer: Medicare Other

## 2019-05-06 ENCOUNTER — Other Ambulatory Visit: Payer: Self-pay

## 2019-05-06 ENCOUNTER — Ambulatory Visit (INDEPENDENT_AMBULATORY_CARE_PROVIDER_SITE_OTHER): Payer: Medicare Other | Admitting: Internal Medicine

## 2019-05-06 ENCOUNTER — Encounter: Payer: Self-pay | Admitting: Internal Medicine

## 2019-05-06 ENCOUNTER — Institutional Professional Consult (permissible substitution): Payer: Medicare Other | Admitting: Internal Medicine

## 2019-05-06 VITALS — BP 134/80 | HR 69 | Temp 98.5°F | Ht 66.0 in | Wt 133.6 lb

## 2019-05-06 DIAGNOSIS — J449 Chronic obstructive pulmonary disease, unspecified: Secondary | ICD-10-CM | POA: Diagnosis not present

## 2019-05-06 DIAGNOSIS — R911 Solitary pulmonary nodule: Secondary | ICD-10-CM | POA: Diagnosis not present

## 2019-05-06 MED ORDER — STIOLTO RESPIMAT 2.5-2.5 MCG/ACT IN AERS: 2.0000 | INHALATION_SPRAY | Freq: Every day | RESPIRATORY_TRACT | 0 refills | Status: DC

## 2019-05-06 MED ORDER — STIOLTO RESPIMAT 2.5-2.5 MCG/ACT IN AERS: 2.0000 | INHALATION_SPRAY | Freq: Every day | RESPIRATORY_TRACT | 11 refills | Status: DC

## 2019-05-06 NOTE — Patient Instructions (Addendum)
Stiolto 2 puffs each am - first thing in am - think of it like  high octane fuel   PFTs as soon as we can schedule them - take the stiolto the am of the test   We will call to schedule a PET scan and see if your need lung surgery ([place]) .

## 2019-05-06 NOTE — Assessment & Plan Note (Addendum)
Quit smoking 2011 -  See LDSCT  04/26/19 :  RUL  10.5 spiculated is suspicious > PET ordered   CT results reviewed with pt >>> Too small for PET or bx, not suspicious enough for excisional bx > really only option for now is follow the Fleischner society guidelines as rec by radiology>  Either 3 m f/u or PET now with the latter not as sensitive at the smaller size and false negatives a concern/ reviewed.  Discussed in detail all the  indications, usual  risks and alternatives  relative to the benefits with patient who agrees to proceed with w/u with PET first but in meanwhile work on his copd to improve his chances of being a surgical candidate if need be          Each maintenance medication was reviewed in detail including emphasizing most importantly the difference between maintenance and prns and under what circumstances the prns are to be triggered using an action plan format where appropriate.  Total time for H and P, chart review, counseling, teaching device and generating customized AVS unique to this office visit / charting = 60 min

## 2019-05-06 NOTE — Progress Notes (Signed)
Luis [name], male    DOB: 1950-05-01, 68 y.o.   MRN: [id number]   Brief patient profile:  23 yowm quit smoking 2011 with doe then and about the same since on advair helped some then quit due to [facility] and back to baseline doe since but referred to PUlmonary clinic in [place] as first avail consult slot at request of Dr Mertha Finders for new SPN on LCSCT / expedited w/u req by pt's wife who did not come to ov with pt per Covid 19 restrictions.      History of Present Illness  05/06/2019  Pulmonary/ 1st office eval/Tige Meas  Chief Complaint  Patient presents with  . Pulmonary Consult    Referred by Dr. [name] for eval of pulmonary nodules.   Dyspnea:  MMRC2 = can't walk a nl pace on a flat grade s sob but does fine slow and flat / lifting is a problem / stops at top of steps to catch breath Cough: rattles each am white mucus , never bloody  Sleep: flat/ on back one pillow SABA use: none   No obvious day to day or daytime variability or assoc  purulent sputum or mucus plugs or hemoptysis or cp or chest tightness, subjective wheeze or overt sinus or hb symptoms.   Sleeping fine flat without nocturnal    exacerbation  of respiratory  c/o's or need for noct saba. Also denies any obvious fluctuation of symptoms with weather or environmental changes or other aggravating or alleviating factors except as outlined above   No unusual exposure hx or h/o childhood pna/ asthma or knowledge of premature birth.  Current Allergies, Complete Past Medical History, Past Surgical History, Family History, and Social History were reviewed in [employer] record.  ROS  The following are not active complaints unless bolded Hoarseness, sore throat, dysphagia, dental problems, itching, sneezing,  nasal congestion or discharge of excess mucus or purulent secretions, ear ache,   fever, chills, sweats, unintended wt loss or wt gain, classically pleuritic or exertional cp,  orthopnea  pnd or arm/hand swelling  or leg swelling, presyncope, palpitations, abdominal pain, anorexia, nausea, vomiting, diarrhea  or change in bowel habits or change in bladder habits, change in stools or change in urine, dysuria, hematuria,  rash, arthralgias, visual complaints, headache, numbness, weakness or ataxia or problems with walking or coordination,  change in mood or  memory.           Past Medical History:  Diagnosis Date  . Arthritis    through out body  . Chronic bronchitis ([place])   . Concussion   . COPD (chronic obstructive pulmonary disease) ([place])   . GERD (gastroesophageal reflux disease)   . Headache    alot of days, related to spine issues  . Lumbar radiculopathy 2013  . Peyronie's disease   . Prostatitis 2011   Dr. Gaynelle Arabian  . RLS (restless legs syndrome)   . Shingles 06/13/2018   shingles on back, finished with prednisone, stills has scabs and pain  . Thigh pain    Bilateral Thigh  . Thigh pain 10/13   bilateral  . Tobacco abuse 2010   still uses electronic cig/ emphysema, SOB easily  . Wears partial dentures    upper    Outpatient Medications Prior to Visit  Medication Sig Dispense Refill  . Cholecalciferol (VITAMIN D-3) 5000 UNIT/ML LIQD Place under the tongue.    . vitamin B-12 (CYANOCOBALAMIN) 500 MCG tablet Take 500 mcg by mouth daily.    [name]  Multiple Vitamin (MULTIVITAMIN) tablet Take 1 tablet by mouth daily.           Objective:     BP 134/80 (BP Location: Left Arm, Cuff Size: Normal)   Pulse 69   Temp 98.5 F (36.9 C) (Temporal)   Ht 5\' 6"  (1.676 m)   Wt 133 lb 9.6 oz (60.6 kg)   SpO2 97% Comment: on RA  BMI 21.56 kg/m   SpO2: 97 %(on RA)    HEENT : pt wearing mask not removed for exam due to covid -19 concerns.    NECK :  without JVD/Nodes/TM/ nl carotid upstrokes bilaterally   LUNGS: no acc muscle use,  Mod barrel  contour chest wall with bilateral  Distant bs s audible wheeze and  without cough on insp or exp maneuvers and mod   Hyperresonant  to  percussion bilaterally     CV:  RRR  no s3 or murmur or increase in P2, and no edema   ABD:  soft and nontender with pos mid insp Hoover's  in the supine position. No bruits or organomegaly appreciated, bowel sounds nl  MS:     ext warm without deformities, calf tenderness, cyanosis or clubbing No obvious joint restrictions   SKIN: warm and dry without lesions    NEURO:  alert, approp, nl sensorium with  no motor or cerebellar deficits apparent.        I personally reviewed images and agree with radiology impression as follows:   Chest LDSCT   04/26/19 1. Spiculated right upper lobe nodule measures at least 10.5 mm. Lung-RADS 4A, suspicious. Follow up low-dose chest CT without contrast in 3 months (please use the following order, "CT CHEST LCS NODULE FOLLOW-UP W/O CM") is recommended. Alternatively, PET may be considered when there is a solid component 69mm or larger. These results will be called to the ordering clinician or representative by the Radiologist Assistant, and communication documented in the PACS or [employer]. 2. Aortic atherosclerosis (ICD10-I70.0). Coronary artery calcification. 3.  Emphysema (ICD10-J43.9).      Assessment   COPD GOLD ? / Group B  Stopped smoking 2011  - 05/06/2019  After extensive coaching inhaler device,  effectiveness =    90% with smi > start stiolto 2 puffs each am   Pt is Group B in terms of symptom/risk and laba/lama therefore appropriate rx at this point >>>  stiolto or bevespi best options since already has a chronic cough and dpi's might aggravate this.    Needs pfts while on stiolto to see if eligible for RULobectomy if indicated.    Discussed stiolto being the equivalent of high octane fuel - needs to keep filling the tank daily, not prn  Advised:  formulary restrictions will be an ongoing challenge for the forseable future and I would be happy to pick an alternative if the pt will first  provide me a  list of them -  pt  will need to return here for training for any new device that is required eg dpi vs hfa vs respimat.    In the meantime we can always provide samples so that the patient never runs out of any needed respiratory medications.       Solitary pulmonary nodule on lung CT Quit smoking 2011 -  See LDSCT  04/26/19 :  RUL  10.5 spiculated is suspicious > PET ordered   CT results reviewed with pt >>> Too small for PET or bx, not suspicious enough for excisional  bx > really only option for now is follow the Fleischner society guidelines as rec by radiology>  Either 3 m f/u or PET now with the latter not as sensitive at the smaller size and false negatives a concern/ reviewed.  Discussed in detail all the  indications, usual  risks and alternatives  relative to the benefits with patient who agrees to proceed with w/u with PET first but in meanwhile work on his copd to improve his chances of being a surgical candidate if need be          Each maintenance medication was reviewed in detail including emphasizing most importantly the difference between maintenance and prns and under what circumstances the prns are to be triggered using an action plan format where appropriate.  Total time for H and P, chart review, counseling, teaching device and generating customized AVS unique to this office visit / charting = 60 min              Christinia Gully, MD 05/06/2019

## 2019-05-06 NOTE — Assessment & Plan Note (Addendum)
Stopped smoking 2011  - 05/06/2019  After extensive coaching inhaler device,  effectiveness =    90% with smi > start stiolto 2 puffs each am   Pt is Group B in terms of symptom/risk and laba/lama therefore appropriate rx at this point >>>  stiolto or bevespi best options since already has a chronic cough and dpi's might aggravate this.    Needs pfts while on stiolto to see if eligible for RULobectomy if indicated.    Discussed stiolto being the equivalent of high octane fuel - needs to keep filling the tank daily, not prn  Advised:  formulary restrictions will be an ongoing challenge for the forseable future and I would be happy to pick an alternative if the pt will first  provide me a list of them -  pt  will need to return here for training for any new device that is required eg dpi vs hfa vs respimat.    In the meantime we can always provide samples so that the patient never runs out of any needed respiratory medications.

## 2019-05-10 ENCOUNTER — Telehealth: Payer: Self-pay | Admitting: Internal Medicine

## 2019-05-10 DIAGNOSIS — R911 Solitary pulmonary nodule: Secondary | ICD-10-CM

## 2019-05-10 NOTE — Telephone Encounter (Signed)
Called and spoke with patient and his wife and told them that the order had been placed and that the [facility] would call them to get it scheduled. Expressed understanding. Nothing further needed at this time.

## 2019-05-17 ENCOUNTER — Other Ambulatory Visit: Payer: Self-pay | Admitting: Internal Medicine

## 2019-05-17 ENCOUNTER — Other Ambulatory Visit: Payer: Self-pay

## 2019-05-17 ENCOUNTER — Ambulatory Visit
Admission: RE | Admit: 2019-05-17 | Discharge: 2019-05-17 | Disposition: A | Discharge status: Home / Self Care | Payer: Medicare Other | Source: Ambulatory Visit | Attending: Internal Medicine | Admitting: Internal Medicine

## 2019-05-17 DIAGNOSIS — I2510 Atherosclerotic heart disease of native coronary artery without angina pectoris: Secondary | ICD-10-CM | POA: Diagnosis not present

## 2019-05-17 DIAGNOSIS — R911 Solitary pulmonary nodule: Secondary | ICD-10-CM | POA: Diagnosis not present

## 2019-05-17 DIAGNOSIS — K5730 Diverticulosis of large intestine without perforation or abscess without bleeding: Secondary | ICD-10-CM | POA: Diagnosis not present

## 2019-05-17 DIAGNOSIS — N400 Enlarged prostate without lower urinary tract symptoms: Secondary | ICD-10-CM | POA: Diagnosis not present

## 2019-05-17 DIAGNOSIS — R918 Other nonspecific abnormal finding of lung field: Secondary | ICD-10-CM | POA: Diagnosis not present

## 2019-05-17 DIAGNOSIS — I700 Atherosclerosis of aorta: Secondary | ICD-10-CM | POA: Insufficient documentation

## 2019-05-17 DIAGNOSIS — J439 Emphysema, unspecified: Secondary | ICD-10-CM | POA: Diagnosis not present

## 2019-05-17 LAB — GLUCOSE, CAPILLARY: Glucose-Capillary: 89 mg/dL (ref 70–99)

## 2019-05-17 MED ORDER — FLUDEOXYGLUCOSE F - 18 (FDG) INJECTION
6.9000 | Freq: Once | INTRAVENOUS | Status: AC | PRN
  Administered 2019-05-17: 7.45 via INTRAVENOUS

## 2019-05-18 ENCOUNTER — Other Ambulatory Visit (HOSPITAL_COMMUNITY)
Admission: RE | Admit: 2019-05-18 | Discharge: 2019-05-18 | Disposition: A | Discharge status: Home / Self Care | Payer: Medicare Other | Source: Ambulatory Visit | Attending: Internal Medicine | Admitting: Internal Medicine

## 2019-05-18 DIAGNOSIS — Z01812 Encounter for preprocedural laboratory examination: Secondary | ICD-10-CM | POA: Insufficient documentation

## 2019-05-18 DIAGNOSIS — Z20822 Contact with and (suspected) exposure to covid-19: Secondary | ICD-10-CM | POA: Insufficient documentation

## 2019-05-18 LAB — SARS CORONAVIRUS 2 (TAT 6-24 HRS): SARS Coronavirus 2: NEGATIVE

## 2019-05-21 ENCOUNTER — Ambulatory Visit (INDEPENDENT_AMBULATORY_CARE_PROVIDER_SITE_OTHER): Payer: Medicare Other | Admitting: Internal Medicine

## 2019-05-21 ENCOUNTER — Other Ambulatory Visit: Payer: Self-pay

## 2019-05-21 DIAGNOSIS — J449 Chronic obstructive pulmonary disease, unspecified: Secondary | ICD-10-CM

## 2019-05-21 LAB — PULMONARY FUNCTION TEST
DL/VA % pred: 100 %
DL/VA: 4.19 ml/min/mmHg/L
DLCO cor % pred: 104 %
DLCO cor: 24.31 ml/min/mmHg
DLCO unc % pred: 104 %
DLCO unc: 24.31 ml/min/mmHg
FEF 25-75 Post: 0.77 L/sec
FEF 25-75 Pre: 0.47 L/sec
FEF2575-%Change-Post: 63 %
FEF2575-%Pred-Post: 33 %
FEF2575-%Pred-Pre: 20 %
FEV1-%Change-Post: 28 %
FEV1-%Pred-Post: 43 %
FEV1-%Pred-Pre: 34 %
FEV1-Post: 1.26 L
FEV1-Pre: 0.98 L
FEV1FVC-%Change-Post: 7 %
FEV1FVC-%Pred-Pre: 52 %
FEV6-%Change-Post: 23 %
FEV6-%Pred-Post: 81 %
FEV6-%Pred-Pre: 66 %
FEV6-Post: 2.99 L
FEV6-Pre: 2.42 L
FEV6FVC-%Change-Post: 3 %
FEV6FVC-%Pred-Post: 105 %
FEV6FVC-%Pred-Pre: 102 %
FVC-%Change-Post: 19 %
FVC-%Pred-Post: 77 %
FVC-%Pred-Pre: 64 %
FVC-Post: 3 L
FVC-Pre: 2.52 L
Post FEV1/FVC ratio: 42 %
Post FEV6/FVC ratio: 100 %
Pre FEV1/FVC ratio: 39 %
Pre FEV6/FVC Ratio: 96 %
RV % pred: 226 %
RV: 4.91 L
TLC % pred: 133 %
TLC: 8.3 L

## 2019-05-21 NOTE — Progress Notes (Signed)
Full PFT performed today. °

## 2019-05-24 ENCOUNTER — Encounter: Payer: Self-pay | Admitting: Thoracic Surgery (Cardiothoracic Vascular Surgery)

## 2019-05-25 ENCOUNTER — Other Ambulatory Visit: Payer: Self-pay

## 2019-05-25 ENCOUNTER — Encounter: Payer: Self-pay | Admitting: Thoracic Surgery (Cardiothoracic Vascular Surgery)

## 2019-05-25 ENCOUNTER — Institutional Professional Consult (permissible substitution) (INDEPENDENT_AMBULATORY_CARE_PROVIDER_SITE_OTHER): Payer: Medicare Other | Admitting: Thoracic Surgery (Cardiothoracic Vascular Surgery)

## 2019-05-25 VITALS — BP 170/89 | HR 67 | Temp 97.9°F | Resp 20 | Ht 66.0 in | Wt 133.0 lb

## 2019-05-25 DIAGNOSIS — R911 Solitary pulmonary nodule: Secondary | ICD-10-CM | POA: Diagnosis not present

## 2019-05-25 NOTE — H&P (View-Only) (Signed)
PCP is [name], MD Referring Provider is Tanda Rockers, MD  Chief Complaint  Patient presents with  . Lung Lesion    Surgical eval, PET Scan 05/17/19, Chest CT 04/26/19, PFT's 05/21/19    HPI: Luis [name] is sent for consultation regarding a right upper lobe lung nodule.  Luis [name] is a 69 year old man with a past medical history significant for tobacco abuse, COPD, chronic bronchitis, reflux, lumbar radiculopathy, restless legs, and arthritis.  He recently saw Dr. Inda Merlin.  A low-dose CT scan was ordered for lung cancer screening due to his history of tobacco abuse.  It showed a spiculated right upper lobe lung nodule.  He was referred to Dr. Melvyn Novas.  A PET/CT showed the nodule was hypermetabolic with an SUV of 6.4  He quit smoking in 2011.  He smoked 2 packs/day for 40 years prior to that.  He denies any chest pain, pressure, or tightness at rest or with exertion.  He does have some reflux.  He has a cough which is occasionally productive of clear sputum.  He denies wheezing.  He complained about the pricing of Stiolto and is unable to afford that medication.  He has chronic headaches which have not changed recently.  He does get short of breath with exertion.  He is a little difficult to pin down exactly but can walk up a flight of stairs without stopping.  He can walk a block on level ground without stopping.  Zubrod Score: At the time of surgery this patient's most appropriate activity status/level should be described as: []     0    Normal activity, no symptoms []     1    Restricted in physical strenuous activity but ambulatory, able to do out light work [x]     2    Ambulatory and capable of self care, unable to do work activities, up and about >50 % of waking hours                              []     3    Only limited self care, in bed greater than 50% of waking hours []     4    Completely disabled, no self care, confined to bed or chair []     5    Moribund  Past Medical History:   Diagnosis Date  . Arthritis    through out body  . Chronic bronchitis ([place])   . Concussion   . COPD (chronic obstructive pulmonary disease) ([place])   . GERD (gastroesophageal reflux disease)   . Headache    alot of days, related to spine issues  . Lumbar radiculopathy 2013  . Peyronie's disease   . Prostatitis 2011   Dr. Gaynelle Arabian  . RLS (restless legs syndrome)   . Shingles 06/13/2018   shingles on back, finished with prednisone, stills has scabs and pain  . Thigh pain    Bilateral Thigh  . Thigh pain 10/13   bilateral  . Tobacco abuse 2010   still uses electronic cig/ emphysema, SOB easily  . Wears partial dentures    upper    Past Surgical History:  Procedure Laterality Date  . CATARACT EXTRACTION W/PHACO Right 07/08/2018   Procedure: CATARACT EXTRACTION PHACO AND INTRAOCULAR LENS PLACEMENT ([place]) RIGHT;  Surgeon: Leandrew Koyanagi, MD;  Location: [facility];  Service: Ophthalmology;  Laterality: Right;  . CATARACT EXTRACTION W/PHACO Left 07/29/2018   Procedure: CATARACT EXTRACTION  PHACO AND INTRAOCULAR LENS PLACEMENT ([place]) LEFT TORIC LENS;  Surgeon: Leandrew Koyanagi, MD;  Location: [place] [facility];  Service: Ophthalmology;  Laterality: Left;  . COLONOSCOPY    . TONSILLECTOMY      Family History  Problem Relation Age of Onset  . Diabetes Mother   . Hyperlipidemia Father   . Hypertension Father     Social History Social History   Tobacco Use  . Smoking status: Former Smoker    Packs/day: 2.00    Years: 40.00    Pack years: 80.00    Types: Cigarettes    Quit date: 01/14/2009    Years since quitting: 10.3  . Smokeless tobacco: Former Network engineer Use Topics  . Alcohol use: Yes    Alcohol/week: 12.0 standard drinks    Types: 12 Cans of beer per week  . Drug use: No    Current Outpatient Medications  Medication Sig Dispense Refill  . Cholecalciferol (VITAMIN D-3) 5000 UNIT/ML LIQD Place under the tongue.    . Tiotropium  Bromide-Olodaterol (STIOLTO RESPIMAT) 2.5-2.5 MCG/ACT AERS Inhale 2 puffs into the lungs daily. 4 g 11  . vitamin B-12 (CYANOCOBALAMIN) 500 MCG tablet Take 500 mcg by mouth daily.     No current facility-administered medications for this visit.    Allergies  Allergen Reactions  . Doxycycline Itching and Other (See Comments)    Itching/ redness    Review of Systems  Constitutional: Negative for activity change, appetite change, chills, fever and unexpected weight change.  HENT: Negative for trouble swallowing and voice change.   Eyes: Negative for visual disturbance.  Respiratory: Positive for cough and shortness of breath. Negative for wheezing.   Cardiovascular: Negative for chest pain and leg swelling.  Gastrointestinal: Positive for abdominal pain (Reflux).  Genitourinary: Positive for frequency and urgency.  Musculoskeletal: Positive for arthralgias and joint swelling.       Leg cramps  Neurological: Positive for headaches. Negative for syncope and weakness.  Hematological: Negative for adenopathy. Bruises/bleeds easily (On arms).  All other systems reviewed and are negative.   BP (!) 170/89   Pulse 67   Temp 97.9 F (36.6 C) (Skin)   Resp 20   Ht 5\' 6"  (1.676 m)   Wt 133 lb (60.3 kg)   SpO2 98% Comment: RA  BMI 21.47 kg/m  Physical Exam Vitals reviewed.  Constitutional:      General: He is not in acute distress.    Appearance: Normal appearance.  HENT:     Head: Normocephalic and atraumatic.  Eyes:     General: No scleral icterus.    Extraocular Movements: Extraocular movements intact.  Neck:     Vascular: No carotid bruit.  Cardiovascular:     Rate and Rhythm: Normal rate and regular rhythm.     Heart sounds: Normal heart sounds. No murmur. No friction rub. No gallop.   Pulmonary:     Effort: No respiratory distress.     Breath sounds: No wheezing or rales.     Comments: Diminished breath sounds bilaterally Abdominal:     General: There is no distension.      Palpations: Abdomen is soft.     Tenderness: There is no abdominal tenderness.  Musculoskeletal:     Cervical back: Neck supple.  Lymphadenopathy:     Cervical: No cervical adenopathy.  Skin:    General: Skin is warm and dry.  Neurological:     General: No focal deficit present.     Mental Status: He  is alert and oriented to person, place, and time.     Cranial Nerves: No cranial nerve deficit.     Motor: No weakness.    Diagnostic Tests: CT CHEST WITHOUT CONTRAST LOW-DOSE FOR LUNG CANCER SCREENING  TECHNIQUE: Multidetector CT imaging of the chest was performed following the standard protocol without IV contrast.  COMPARISON:  None.  FINDINGS: Cardiovascular: Atherosclerotic calcification of the aorta and coronary arteries. Heart size normal. No pericardial effusion.  Mediastinum/Nodes: No pathologically enlarged mediastinal or axillary lymph nodes. Hilar regions are difficult to definitively evaluate without IV contrast but appear grossly unremarkable. Esophagus is unremarkable.  Lungs/Pleura: Centrilobular emphysema. There is a spiculated nodule in right upper lobe which measures at least 10.5 mm (3/97). A non solid nodule in the posterior left upper lobe measures 8.6 mm (3/70). No pleural fluid. Airway is unremarkable.  Upper Abdomen: Low-attenuation lesion in the dome of the liver measures 1.3 cm and is likely a cyst. Visualized portions of the liver, gallbladder and adrenal glands are otherwise unremarkable. Low-attenuation lesions in the kidneys measure up to 3.3 cm on the right, incompletely imaged. Visualized portions of the spleen, pancreas, stomach and bowel are grossly unremarkable.  Musculoskeletal: No worrisome lytic or sclerotic lesions.  IMPRESSION: 1. Spiculated right upper lobe nodule measures at least 10.5 mm. Lung-RADS 4A, suspicious. Follow up low-dose chest CT without contrast in 3 months (please use the following order, "CT CHEST  LCS NODULE FOLLOW-UP W/O CM") is recommended. Alternatively, PET may be considered when there is a solid component 35mm or larger. These results will be called to the ordering clinician or representative by the Radiologist Assistant, and communication documented in the PACS or [employer]. 2. Aortic atherosclerosis (ICD10-I70.0). Coronary artery calcification. 3.  Emphysema (ICD10-J43.9).   Electronically Signed   By: Luis Picket M.D.   On: 04/26/2019 13:17 NUCLEAR MEDICINE PET SKULL BASE TO THIGH  TECHNIQUE: 7.5 mCi F-18 FDG was injected intravenously. Full-ring PET imaging was performed from the skull base to thigh after the radiotracer. CT data was obtained and used for attenuation correction and anatomic localization.  Fasting blood glucose: 89 mg/dl  COMPARISON:  04/26/2019 screening chest CT.  FINDINGS: Mediastinal blood pool activity: SUV max 1.9  Liver activity: SUV max NA  NECK: No hypermetabolic lymph nodes in the neck.  Incidental CT findings: none  CHEST:  Hypermetabolic spiculated irregular 2.1 x 1.2 cm right upper lobe solid pulmonary nodule (series 3/image 92) with max SUV 6.4. No additional hypermetabolic pulmonary findings. Posterior left upper lobe indistinct 0.8 cm subsolid nodule along the major fissure (series 3/image 77) is not associated with significant FDG uptake (max SUV 0.7).  No enlarged or hypermetabolic axillary, mediastinal or hilar lymph nodes.  Incidental CT findings: Moderate centrilobular and paraseptal emphysema. No acute consolidative airspace disease. Left anterior descending coronary atherosclerosis. Mildly atherosclerotic nonaneurysmal thoracic aorta.  ABDOMEN/PELVIS: No abnormal hypermetabolic activity within the liver, pancreas, adrenal glands, or spleen. No hypermetabolic lymph nodes in the abdomen or pelvis.  Incidental CT findings: Simple 1.0 cm left liver dome cyst. Granulomatous splenic  calcifications. Atherosclerotic nonaneurysmal abdominal aorta. Simple bilateral renal cysts, largest 4.6 cm in the medial lower right kidney. Mildly enlarged prostate. Moderate sigmoid diverticulosis.  SKELETON: No focal hypermetabolic activity to suggest skeletal metastasis.  Incidental CT findings: none  IMPRESSION: 1. Hypermetabolic spiculated irregular 2.1 x 1.2 cm right upper lobe solid pulmonary nodule, compatible with primary bronchogenic carcinoma. 2. No hypermetabolic thoracic adenopathy or distant metastatic disease. 3. PET-CT stage IA (  T1c N0 M0). 4. Posterior left upper lobe indistinct 0.8 cm subsolid pulmonary nodule along the major fissure, without significant FDG uptake, warranting surveillance on future follow-up chest CT studies. 5. Chronic findings include: Aortic Atherosclerosis (ICD10-I70.0) and Emphysema (ICD10-J43.9). Coronary atherosclerosis. Moderate sigmoid diverticulosis. Mild prostatomegaly.   Electronically Signed   By: Luis Sorrel M.D.   On: 05/17/2019 12:50 I personally reviewed the CT and PET/CT images and concur with the findings noted above  Pulmonary function testing 05/21/2019 FVC 2.52 (64%)  FEV1 0.98 (34%) FEV1 1.26 (43%) postbronchodilator TLC 8.30 (133%) RV 4.91 (226%) DLCO 24.31 (104%)  Impression: Luis [name] is a 69 year old man with a past medical history significant for tobacco abuse, COPD, chronic bronchitis, reflux, lumbar radiculopathy, restless legs, and arthritis.  He has an 80-pack-year history of smoking prior to quitting 10 years ago.  He recently was found to have a spiculated right upper lobe lung nodule on a low-dose CT for lung cancer screening.  On PET CT there is a 2.1 x 1.2 cm nodule with a SUV of 6.4.  This most likely is a new primary bronchogenic carcinoma.  Given his age, smoking history, appearance of the nodule, and PET findings this has to be considered lung cancer unless it can be proven otherwise.  We  discussed the possibility of attempting to biopsy the nodule bronchoscopically.  A positive biopsy would be an indication for resection.  A negative biopsy would not rule out the possibility of cancer to the possibility of sampling error so would not preclude the need for surgical resection.  We discussed the options of treating the nodule surgically or with radiation.  We discussed the relative advantages and disadvantages of those 2 approaches.  He strongly prefers surgical resection.  His pulmonary function testing is acceptable for right upper lobectomy.  I recommended we proceed with robotic right upper lobectomy for definitive diagnosis and treatment.  I described the proposed operation to Mr. and Luis [name].  They understand the general nature of the procedure including the incisions to be used, the amount of lung to be resected, the need for general anesthesia, the use of drains to postoperatively, the expected hospital stay, and the overall recovery.  I informed him of the indications, risks, benefits, and alternatives.  They understand the risks include, but not limited to death, MI, DVT, PE, bleeding, possible need for transfusion, infection, prolonged air leak, cardiac arrhythmias, neuropathic pain, as well as possibility of other unforeseeable complications.  He accepts the risk and wishes to proceed with surgery.  COPD-he has been using Stiolto but cannot afford the medication.  I recommended he contact Dr. Gustavus Bryant office to discuss alternatives.  Left upper lobe lung nodule-too small to characterize.  Will need continued follow-up.  I discussed this issue with Mr. and Luis [name].  Plan: Robotic right upper lobectomy on Thursday, 06/17/2019  [name], MD [facility] [phone]

## 2019-05-25 NOTE — Progress Notes (Signed)
PCP is [name], MD Referring Provider is Tanda Rockers, MD  Chief Complaint  Patient presents with  . Lung Lesion    Surgical eval, PET Scan 05/17/19, Chest CT 04/26/19, PFT's 05/21/19    HPI: Mr. Cullens is sent for consultation regarding a right upper lobe lung nodule.  Levert Heslop is a 69 year old man with a past medical history significant for tobacco abuse, COPD, chronic bronchitis, reflux, lumbar radiculopathy, restless legs, and arthritis.  He recently saw Dr. Inda Merlin.  A low-dose CT scan was ordered for lung cancer screening due to his history of tobacco abuse.  It showed a spiculated right upper lobe lung nodule.  He was referred to Dr. Melvyn Novas.  A PET/CT showed the nodule was hypermetabolic with an SUV of 6.4  He quit smoking in 2011.  He smoked 2 packs/day for 40 years prior to that.  He denies any chest pain, pressure, or tightness at rest or with exertion.  He does have some reflux.  He has a cough which is occasionally productive of clear sputum.  He denies wheezing.  He complained about the pricing of Stiolto and is unable to afford that medication.  He has chronic headaches which have not changed recently.  He does get short of breath with exertion.  He is a little difficult to pin down exactly but can walk up a flight of stairs without stopping.  He can walk a block on level ground without stopping.  Zubrod Score: At the time of surgery this patient's most appropriate activity status/level should be described as: []     0    Normal activity, no symptoms []     1    Restricted in physical strenuous activity but ambulatory, able to do out light work [x]     2    Ambulatory and capable of self care, unable to do work activities, up and about >50 % of waking hours                              []     3    Only limited self care, in bed greater than 50% of waking hours []     4    Completely disabled, no self care, confined to bed or chair []     5    Moribund  Past Medical History:   Diagnosis Date  . Arthritis    through out body  . Chronic bronchitis ([place])   . Concussion   . COPD (chronic obstructive pulmonary disease) ([place])   . GERD (gastroesophageal reflux disease)   . Headache    alot of days, related to spine issues  . Lumbar radiculopathy 2013  . Peyronie's disease   . Prostatitis 2011   Dr. Gaynelle Arabian  . RLS (restless legs syndrome)   . Shingles 06/13/2018   shingles on back, finished with prednisone, stills has scabs and pain  . Thigh pain    Bilateral Thigh  . Thigh pain 10/13   bilateral  . Tobacco abuse 2010   still uses electronic cig/ emphysema, SOB easily  . Wears partial dentures    upper    Past Surgical History:  Procedure Laterality Date  . CATARACT EXTRACTION W/PHACO Right 07/08/2018   Procedure: CATARACT EXTRACTION PHACO AND INTRAOCULAR LENS PLACEMENT ([place]) RIGHT;  Surgeon: Leandrew Koyanagi, MD;  Location: [place];  Service: Ophthalmology;  Laterality: Right;  . CATARACT EXTRACTION W/PHACO Left 07/29/2018   Procedure: CATARACT EXTRACTION  PHACO AND INTRAOCULAR LENS PLACEMENT ([place]) LEFT TORIC LENS;  Surgeon: Leandrew Koyanagi, MD;  Location: [place];  Service: Ophthalmology;  Laterality: Left;  . COLONOSCOPY    . TONSILLECTOMY      Family History  Problem Relation Age of Onset  . Diabetes Mother   . Hyperlipidemia Father   . Hypertension Father     Social History Social History   Tobacco Use  . Smoking status: Former Smoker    Packs/day: 2.00    Years: 40.00    Pack years: 80.00    Types: Cigarettes    Quit date: 01/14/2009    Years since quitting: 10.3  . Smokeless tobacco: Former Network engineer Use Topics  . Alcohol use: Yes    Alcohol/week: 12.0 standard drinks    Types: 12 Cans of beer per week  . Drug use: No    Current Outpatient Medications  Medication Sig Dispense Refill  . Cholecalciferol (VITAMIN D-3) 5000 UNIT/ML LIQD Place under the tongue.    . Tiotropium  Bromide-Olodaterol (STIOLTO RESPIMAT) 2.5-2.5 MCG/ACT AERS Inhale 2 puffs into the lungs daily. 4 g 11  . vitamin B-12 (CYANOCOBALAMIN) 500 MCG tablet Take 500 mcg by mouth daily.     No current facility-administered medications for this visit.    Allergies  Allergen Reactions  . Doxycycline Itching and Other (See Comments)    Itching/ redness    Review of Systems  Constitutional: Negative for activity change, appetite change, chills, fever and unexpected weight change.  HENT: Negative for trouble swallowing and voice change.   Eyes: Negative for visual disturbance.  Respiratory: Positive for cough and shortness of breath. Negative for wheezing.   Cardiovascular: Negative for chest pain and leg swelling.  Gastrointestinal: Positive for abdominal pain (Reflux).  Genitourinary: Positive for frequency and urgency.  Musculoskeletal: Positive for arthralgias and joint swelling.       Leg cramps  Neurological: Positive for headaches. Negative for syncope and weakness.  Hematological: Negative for adenopathy. Bruises/bleeds easily (On arms).  All other systems reviewed and are negative.   BP (!) 170/89   Pulse 67   Temp 97.9 F (36.6 C) (Skin)   Resp 20   Ht 5\' 6"  (1.676 m)   Wt 133 lb (60.3 kg)   SpO2 98% Comment: RA  BMI 21.47 kg/m  Physical Exam Vitals reviewed.  Constitutional:      General: He is not in acute distress.    Appearance: Normal appearance.  HENT:     Head: Normocephalic and atraumatic.  Eyes:     General: No scleral icterus.    Extraocular Movements: Extraocular movements intact.  Neck:     Vascular: No carotid bruit.  Cardiovascular:     Rate and Rhythm: Normal rate and regular rhythm.     Heart sounds: Normal heart sounds. No murmur. No friction rub. No gallop.   Pulmonary:     Effort: No respiratory distress.     Breath sounds: No wheezing or rales.     Comments: Diminished breath sounds bilaterally Abdominal:     General: There is no distension.      Palpations: Abdomen is soft.     Tenderness: There is no abdominal tenderness.  Musculoskeletal:     Cervical back: Neck supple.  Lymphadenopathy:     Cervical: No cervical adenopathy.  Skin:    General: Skin is warm and dry.  Neurological:     General: No focal deficit present.     Mental Status: He  is alert and oriented to person, place, and time.     Cranial Nerves: No cranial nerve deficit.     Motor: No weakness.    Diagnostic Tests: CT CHEST WITHOUT CONTRAST LOW-DOSE FOR LUNG CANCER SCREENING  TECHNIQUE: Multidetector CT imaging of the chest was performed following the standard protocol without IV contrast.  COMPARISON:  None.  FINDINGS: Cardiovascular: Atherosclerotic calcification of the aorta and coronary arteries. Heart size normal. No pericardial effusion.  Mediastinum/Nodes: No pathologically enlarged mediastinal or axillary lymph nodes. Hilar regions are difficult to definitively evaluate without IV contrast but appear grossly unremarkable. Esophagus is unremarkable.  Lungs/Pleura: Centrilobular emphysema. There is a spiculated nodule in right upper lobe which measures at least 10.5 mm (3/97). A non solid nodule in the posterior left upper lobe measures 8.6 mm (3/70). No pleural fluid. Airway is unremarkable.  Upper Abdomen: Low-attenuation lesion in the dome of the liver measures 1.3 cm and is likely a cyst. Visualized portions of the liver, gallbladder and adrenal glands are otherwise unremarkable. Low-attenuation lesions in the kidneys measure up to 3.3 cm on the right, incompletely imaged. Visualized portions of the spleen, pancreas, stomach and bowel are grossly unremarkable.  Musculoskeletal: No worrisome lytic or sclerotic lesions.  IMPRESSION: 1. Spiculated right upper lobe nodule measures at least 10.5 mm. Lung-RADS 4A, suspicious. Follow up low-dose chest CT without contrast in 3 months (please use the following order, "CT CHEST  LCS NODULE FOLLOW-UP W/O CM") is recommended. Alternatively, PET may be considered when there is a solid component 14mm or larger. These results will be called to the ordering clinician or representative by the Radiologist Assistant, and communication documented in the PACS or [employer]. 2. Aortic atherosclerosis (ICD10-I70.0). Coronary artery calcification. 3.  Emphysema (ICD10-J43.9).   Electronically Signed   By: Lorin Picket M.D.   On: 04/26/2019 13:17 NUCLEAR MEDICINE PET SKULL BASE TO THIGH  TECHNIQUE: 7.5 mCi F-18 FDG was injected intravenously. Full-ring PET imaging was performed from the skull base to thigh after the radiotracer. CT data was obtained and used for attenuation correction and anatomic localization.  Fasting blood glucose: 89 mg/dl  COMPARISON:  04/26/2019 screening chest CT.  FINDINGS: Mediastinal blood pool activity: SUV max 1.9  Liver activity: SUV max NA  NECK: No hypermetabolic lymph nodes in the neck.  Incidental CT findings: none  CHEST:  Hypermetabolic spiculated irregular 2.1 x 1.2 cm right upper lobe solid pulmonary nodule (series 3/image 92) with max SUV 6.4. No additional hypermetabolic pulmonary findings. Posterior left upper lobe indistinct 0.8 cm subsolid nodule along the major fissure (series 3/image 77) is not associated with significant FDG uptake (max SUV 0.7).  No enlarged or hypermetabolic axillary, mediastinal or hilar lymph nodes.  Incidental CT findings: Moderate centrilobular and paraseptal emphysema. No acute consolidative airspace disease. Left anterior descending coronary atherosclerosis. Mildly atherosclerotic nonaneurysmal thoracic aorta.  ABDOMEN/PELVIS: No abnormal hypermetabolic activity within the liver, pancreas, adrenal glands, or spleen. No hypermetabolic lymph nodes in the abdomen or pelvis.  Incidental CT findings: Simple 1.0 cm left liver dome cyst. Granulomatous splenic  calcifications. Atherosclerotic nonaneurysmal abdominal aorta. Simple bilateral renal cysts, largest 4.6 cm in the medial lower right kidney. Mildly enlarged prostate. Moderate sigmoid diverticulosis.  SKELETON: No focal hypermetabolic activity to suggest skeletal metastasis.  Incidental CT findings: none  IMPRESSION: 1. Hypermetabolic spiculated irregular 2.1 x 1.2 cm right upper lobe solid pulmonary nodule, compatible with primary bronchogenic carcinoma. 2. No hypermetabolic thoracic adenopathy or distant metastatic disease. 3. PET-CT stage IA (  T1c N0 M0). 4. Posterior left upper lobe indistinct 0.8 cm subsolid pulmonary nodule along the major fissure, without significant FDG uptake, warranting surveillance on future follow-up chest CT studies. 5. Chronic findings include: Aortic Atherosclerosis (ICD10-I70.0) and Emphysema (ICD10-J43.9). Coronary atherosclerosis. Moderate sigmoid diverticulosis. Mild prostatomegaly.   Electronically Signed   By: Ilona Sorrel M.D.   On: 05/17/2019 12:50 I personally reviewed the CT and PET/CT images and concur with the findings noted above  Pulmonary function testing 05/21/2019 FVC 2.52 (64%)  FEV1 0.98 (34%) FEV1 1.26 (43%) postbronchodilator TLC 8.30 (133%) RV 4.91 (226%) DLCO 24.31 (104%)  Impression: Darcell Sabino is a 69 year old man with a past medical history significant for tobacco abuse, COPD, chronic bronchitis, reflux, lumbar radiculopathy, restless legs, and arthritis.  He has an 80-pack-year history of smoking prior to quitting 10 years ago.  He recently was found to have a spiculated right upper lobe lung nodule on a low-dose CT for lung cancer screening.  On PET CT there is a 2.1 x 1.2 cm nodule with a SUV of 6.4.  This most likely is a new primary bronchogenic carcinoma.  Given his age, smoking history, appearance of the nodule, and PET findings this has to be considered lung cancer unless it can be proven otherwise.  We  discussed the possibility of attempting to biopsy the nodule bronchoscopically.  A positive biopsy would be an indication for resection.  A negative biopsy would not rule out the possibility of cancer to the possibility of sampling error so would not preclude the need for surgical resection.  We discussed the options of treating the nodule surgically or with radiation.  We discussed the relative advantages and disadvantages of those 2 approaches.  He strongly prefers surgical resection.  His pulmonary function testing is acceptable for right upper lobectomy.  I recommended we proceed with robotic right upper lobectomy for definitive diagnosis and treatment.  I described the proposed operation to Mr. and Mrs. Graffam.  They understand the general nature of the procedure including the incisions to be used, the amount of lung to be resected, the need for general anesthesia, the use of drains to postoperatively, the expected hospital stay, and the overall recovery.  I informed him of the indications, risks, benefits, and alternatives.  They understand the risks include, but not limited to death, MI, DVT, PE, bleeding, possible need for transfusion, infection, prolonged air leak, cardiac arrhythmias, neuropathic pain, as well as possibility of other unforeseeable complications.  He accepts the risk and wishes to proceed with surgery.  COPD-he has been using Stiolto but cannot afford the medication.  I recommended he contact Dr. Gustavus Bryant office to discuss alternatives.  Left upper lobe lung nodule-too small to characterize.  Will need continued follow-up.  I discussed this issue with Mr. and Mrs. Mortellaro.  Plan: Robotic right upper lobectomy on Thursday, 06/17/2019  [name], MD [facility] [phone]

## 2019-05-26 ENCOUNTER — Encounter: Payer: Self-pay | Admitting: *Deleted

## 2019-05-26 ENCOUNTER — Other Ambulatory Visit: Payer: Self-pay | Admitting: *Deleted

## 2019-05-26 DIAGNOSIS — R911 Solitary pulmonary nodule: Secondary | ICD-10-CM

## 2019-05-31 ENCOUNTER — Telehealth: Payer: Self-pay | Admitting: Internal Medicine

## 2019-05-31 NOTE — Telephone Encounter (Signed)
Spoke with patient. He stated that the Stiolto inhaler is too expensive for him. He stated that he has [facility] and had signed for the basic plan because at the time of signup, he did not have any health issues.   I advised him that we would need a copy of his formulary to see what is covered. He verbalized understanding.   [facility] and requested a copy of the formulary. Will keep message open until it has been received.

## 2019-06-03 NOTE — Telephone Encounter (Signed)
Checked Luis [name]'s inbox, formulary has not been received as of today.

## 2019-06-07 ENCOUNTER — Institutional Professional Consult (permissible substitution): Payer: Medicare Other | Admitting: Internal Medicine

## 2019-06-08 DIAGNOSIS — R911 Solitary pulmonary nodule: Secondary | ICD-10-CM | POA: Diagnosis not present

## 2019-06-08 DIAGNOSIS — F17201 Nicotine dependence, unspecified, in remission: Secondary | ICD-10-CM | POA: Diagnosis not present

## 2019-06-08 DIAGNOSIS — J449 Chronic obstructive pulmonary disease, unspecified: Secondary | ICD-10-CM | POA: Diagnosis not present

## 2019-06-08 DIAGNOSIS — R05 Cough: Secondary | ICD-10-CM | POA: Diagnosis not present

## 2019-06-08 NOTE — Telephone Encounter (Signed)
ATC pt, there was no answer and I could not leave a message. Will try back.

## 2019-06-08 NOTE — Telephone Encounter (Signed)
I have not seen this. Not able to check this morning as I am out of the office for the rest of the wk.

## 2019-06-08 NOTE — Telephone Encounter (Signed)
This is actually a [facility] pt, my apologies. Has this been received?

## 2019-06-09 NOTE — Telephone Encounter (Signed)
LMTCB x1 for pt.  

## 2019-06-09 NOTE — Telephone Encounter (Signed)
Patient is returning phone call. Patient phone number is [phone].

## 2019-06-10 NOTE — Telephone Encounter (Signed)
LMTCB x1 for pt.  

## 2019-06-11 NOTE — Telephone Encounter (Signed)
Spoke with pt. Advised him that we would need his drug formulary. States that he will call his insurance company today and get this information.

## 2019-06-11 NOTE — Progress Notes (Signed)
[id number] - [place], [place] - [address] [address] Phone: [phone] Fax: [phone]      Your procedure is scheduled on Thursday, 06/17/2019.  Report to [facility] Main Entrance "A" at 06:00 A.M., and check in at the Admitting office.  Call this number if you have problems the morning of surgery:  [phone]  Call [phone] if you have any questions prior to your surgery date Monday-Friday 8am-4pm    Remember:  Do not eat or drink after midnight the night before your surgery   Take these medicines the morning of surgery with A SIP OF WATER: Tiptropium Bromide-Olodaterol (Stiolto Resimat) inhaler  As of today, STOP taking any Aspirin (unless otherwise instructed by your surgeon) and Aspirin containing products, Aleve, Naproxen, Ibuprofen, Motrin, Advil, Goody's, BC's, all herbal medications, fish oil, and all vitamins.                      Do not wear jewelry            Do not wear lotions, powders, colognes, or deodorant.            Men may shave face and neck.            Do not bring valuables to the hospital.            [facility] is not responsible for any belongings or valuables.  Do NOT Smoke (Tobacco/Vapping) or drink Alcohol 24 hours prior to your procedure  If you use a CPAP at night, you may bring all equipment for your overnight stay.   Contacts, glasses, dentures or bridgework may not be worn into surgery.      For patients admitted to the hospital, discharge time will be determined by your treatment team.   Patients discharged the day of surgery will not be allowed to drive home, and someone needs to stay with them for 24 hours.    Special instructions:   [place]- Preparing For Surgery  Before surgery, you can play an important role. Because skin is not sterile, your skin needs to be as free of germs as possible. You can reduce the number of germs on your skin by washing with CHG (chlorahexidine gluconate) Soap  before surgery.  CHG is an antiseptic cleaner which kills germs and bonds with the skin to continue killing germs even after washing.    Oral Hygiene is also important to reduce your risk of infection.  Remember - BRUSH YOUR TEETH THE MORNING OF SURGERY WITH YOUR REGULAR TOOTHPASTE  Please do not use if you have an allergy to CHG or antibacterial soaps. If your skin becomes reddened/irritated stop using the CHG.  Do not shave (including legs and underarms) for at least 48 hours prior to first CHG shower. It is OK to shave your face.  Please follow these instructions carefully.   1. Shower the NIGHT BEFORE SURGERY and the MORNING OF SURGERY with CHG Soap.   2. If you chose to wash your hair, wash your hair first as usual with your normal shampoo.  3. After you shampoo, rinse your hair and body thoroughly to remove the shampoo.  4. Use CHG as you would any other liquid soap. You can apply CHG directly to the skin and wash gently with a scrungie or a clean washcloth.   5. Apply the CHG Soap to your body ONLY FROM THE NECK DOWN.  Do not use on open  wounds or open sores. Avoid contact with your eyes, ears, mouth and genitals (private parts). Wash Face and genitals (private parts)  with your normal soap.   6. Wash thoroughly, paying special attention to the area where your surgery will be performed.  7. Thoroughly rinse your body with warm water from the neck down.  8. DO NOT shower/wash with your normal soap after using and rinsing off the CHG Soap.  9. Pat yourself dry with a CLEAN TOWEL.  10. Wear CLEAN PAJAMAS to bed the night before surgery, wear comfortable clothes the morning of surgery  11. Place CLEAN SHEETS on your bed the night of your first shower and DO NOT SLEEP WITH PETS.   Day of Surgery: Shower with CHG Soap as directed. Do not apply any deodorants/lotions.  Please wear clean clothes to the hospital/surgery center.   Remember to brush your teeth WITH YOUR REGULAR  TOOTHPASTE.   Please read over the following fact sheets that you were given.

## 2019-06-15 ENCOUNTER — Other Ambulatory Visit: Payer: Self-pay

## 2019-06-15 ENCOUNTER — Ambulatory Visit (HOSPITAL_COMMUNITY)
Admission: RE | Admit: 2019-06-15 | Discharge: 2019-06-15 | Disposition: A | Discharge status: Home / Self Care | Payer: Medicare Other | Source: Ambulatory Visit | Attending: Thoracic Surgery (Cardiothoracic Vascular Surgery) | Admitting: Thoracic Surgery (Cardiothoracic Vascular Surgery)

## 2019-06-15 ENCOUNTER — Other Ambulatory Visit (HOSPITAL_COMMUNITY)
Admission: RE | Admit: 2019-06-15 | Discharge: 2019-06-15 | Disposition: A | Discharge status: Home / Self Care | Payer: Medicare Other | Source: Ambulatory Visit | Attending: Thoracic Surgery (Cardiothoracic Vascular Surgery) | Admitting: Thoracic Surgery (Cardiothoracic Vascular Surgery)

## 2019-06-15 ENCOUNTER — Encounter (HOSPITAL_COMMUNITY)
Admission: RE | Admit: 2019-06-15 | Discharge: 2019-06-15 | Disposition: A | Discharge status: Home / Self Care | Payer: Medicare Other | Source: Ambulatory Visit | Attending: Thoracic Surgery (Cardiothoracic Vascular Surgery) | Admitting: Thoracic Surgery (Cardiothoracic Vascular Surgery)

## 2019-06-15 ENCOUNTER — Encounter (HOSPITAL_COMMUNITY): Payer: Self-pay

## 2019-06-15 DIAGNOSIS — J449 Chronic obstructive pulmonary disease, unspecified: Secondary | ICD-10-CM | POA: Diagnosis not present

## 2019-06-15 DIAGNOSIS — R911 Solitary pulmonary nodule: Secondary | ICD-10-CM

## 2019-06-15 DIAGNOSIS — Z20822 Contact with and (suspected) exposure to covid-19: Secondary | ICD-10-CM | POA: Insufficient documentation

## 2019-06-15 DIAGNOSIS — Z01818 Encounter for other preprocedural examination: Secondary | ICD-10-CM | POA: Diagnosis not present

## 2019-06-15 HISTORY — DX: Emphysema, unspecified: J43.9

## 2019-06-15 HISTORY — DX: Essential (primary) hypertension: I10

## 2019-06-15 HISTORY — DX: Dyspnea, unspecified: R06.00

## 2019-06-15 LAB — URINALYSIS, ROUTINE W REFLEX MICROSCOPIC
Bilirubin Urine: NEGATIVE
Glucose, UA: NEGATIVE mg/dL
Hgb urine dipstick: NEGATIVE
Ketones, ur: NEGATIVE mg/dL
Leukocytes,Ua: NEGATIVE
Nitrite: NEGATIVE
Protein, ur: NEGATIVE mg/dL
Specific Gravity, Urine: 1.015 (ref 1.005–1.030)
pH: 7 (ref 5.0–8.0)

## 2019-06-15 LAB — COMPREHENSIVE METABOLIC PANEL
ALT: 15 U/L (ref 0–44)
AST: 15 U/L (ref 15–41)
Albumin: 4.1 g/dL (ref 3.5–5.0)
Alkaline Phosphatase: 44 U/L (ref 38–126)
Anion gap: 12 (ref 5–15)
BUN: 15 mg/dL (ref 8–23)
CO2: 24 mmol/L (ref 22–32)
Calcium: 9.3 mg/dL (ref 8.9–10.3)
Chloride: 99 mmol/L (ref 98–111)
Creatinine, Ser: 1.17 mg/dL (ref 0.61–1.24)
GFR calc Af Amer: >60 mL/min (ref >60)
GFR calc non Af Amer: >60 mL/min (ref >60)
Glucose, Bld: 93 mg/dL (ref 70–99)
Potassium: 4.3 mmol/L (ref 3.5–5.1)
Sodium: 135 mmol/L (ref 135–145)
Total Bilirubin: 0.6 mg/dL (ref 0.3–1.2)
Total Protein: 6.4 g/dL — ABNORMAL LOW (ref 6.5–8.1)

## 2019-06-15 LAB — BLOOD GAS, ARTERIAL
Acid-Base Excess: 2.6 mmol/L — ABNORMAL HIGH (ref 0.0–2.0)
Bicarbonate: 26.6 mmol/L (ref 20.0–28.0)
Drawn by: 421801
FIO2: 21
O2 Saturation: 97.6 %
Patient temperature: 37
pCO2 arterial: 41 mmHg (ref 32.0–48.0)
pH, Arterial: 7.428 (ref 7.350–7.450)
pO2, Arterial: 95.1 mmHg (ref 83.0–108.0)

## 2019-06-15 LAB — CBC
HCT: 47.8 % (ref 39.0–52.0)
Hemoglobin: 16.6 g/dL (ref 13.0–17.0)
MCH: 30.6 pg (ref 26.0–34.0)
MCHC: 34.7 g/dL (ref 30.0–36.0)
MCV: 88 fL (ref 80.0–100.0)
Platelets: 234 10*3/uL (ref 150–400)
RBC: 5.43 MIL/uL (ref 4.22–5.81)
RDW: 12.4 % (ref 11.5–15.5)
WBC: 9.7 10*3/uL (ref 4.0–10.5)
nRBC: 0 % (ref 0.0–0.2)

## 2019-06-15 LAB — PROTIME-INR
INR: 1 (ref 0.8–1.2)
Prothrombin Time: 12.4 seconds (ref 11.4–15.2)

## 2019-06-15 LAB — APTT: aPTT: 32 seconds (ref 24–36)

## 2019-06-15 LAB — SURGICAL PCR SCREEN
MRSA, PCR: NEGATIVE
Staphylococcus aureus: NEGATIVE

## 2019-06-15 LAB — ABO/RH: ABO/RH(D): A NEG

## 2019-06-15 LAB — SARS CORONAVIRUS 2 (TAT 6-24 HRS): SARS Coronavirus 2: NEGATIVE

## 2019-06-15 NOTE — Progress Notes (Signed)
PCP - Dr. [name] Cardiologist - patient denies  PPM/ICD - n/a Device Orders -  Rep Notified -   Chest x-ray - 06/15/2019 EKG - 06/15/2019 Stress Test - patient denies ECHO - patient denies Cardiac Cath - patient denies  Sleep Study - patient denies CPAP -   Fasting Blood Sugar - n/a Checks Blood Sugar _____ times a day  Blood Thinner Instructions: n/a Aspirin Instructions: n/a  ERAS Protcol - n/a PRE-SURGERY Ensure or G2-   COVID TEST- 06/15/2019 prior to PAT appointment   Anesthesia review: n/a  Patient denies shortness of breath, fever, cough and chest pain at PAT appointment   All instructions explained to the patient, with a verbal understanding of the material. Patient agrees to go over the instructions while at home for a better understanding. Patient also instructed to self quarantine after being tested for COVID-19. The opportunity to ask questions was provided.

## 2019-06-16 ENCOUNTER — Encounter (HOSPITAL_COMMUNITY): Payer: Self-pay | Admitting: Thoracic Surgery (Cardiothoracic Vascular Surgery)

## 2019-06-16 NOTE — Anesthesia Preprocedure Evaluation (Addendum)
Anesthesia Evaluation  Patient identified by MRN, date of birth, ID band Patient awake    Reviewed: Allergy & Precautions, NPO status , Patient's Chart, lab work & pertinent test results  Airway Mallampati: I  TM Distance: >3 FB Neck ROM: Full    Dental  (+) Edentulous Upper, Dental Advisory Given   Pulmonary COPD, Patient abstained from smoking., former smoker,     + decreased breath sounds      Cardiovascular hypertension, Pt. on medications + Peripheral Vascular Disease   Rhythm:Regular Rate:Normal     Neuro/Psych  Headaches,  Neuromuscular disease negative psych ROS   GI/Hepatic Neg liver ROS, GERD  ,  Endo/Other  negative endocrine ROS  Renal/GU negative Renal ROS     Musculoskeletal  (+) Arthritis ,   Abdominal Normal abdominal exam  (+)   Peds  Hematology negative hematology ROS (+)   Anesthesia Other Findings   Reproductive/Obstetrics                            Anesthesia Physical Anesthesia Plan  ASA: III  Anesthesia Plan: General   Post-op Pain Management:    Induction: Intravenous  PONV Risk Score and Plan: 3 and Ondansetron, Dexamethasone and Midazolam  Airway Management Planned: Double Lumen EBT  Additional Equipment: Arterial line, CVP and Ultrasound Guidance Line Placement  Intra-op Plan:   Post-operative Plan: Extubation in OR  Informed Consent: I have reviewed the patients History and Physical, chart, labs and discussed the procedure including the risks, benefits and alternatives for the proposed anesthesia with the patient or authorized representative who has indicated his/her understanding and acceptance.     Dental advisory given  Plan Discussed with: CRNA  Anesthesia Plan Comments:        Anesthesia Quick Evaluation

## 2019-06-17 ENCOUNTER — Inpatient Hospital Stay (HOSPITAL_COMMUNITY)
Admission: RE | Admit: 2019-06-17 | Discharge: 2019-07-06 | DRG: 164 | Disposition: A | Discharge status: Home Health | Payer: Medicare Other | Attending: Thoracic Surgery (Cardiothoracic Vascular Surgery) | Admitting: Thoracic Surgery (Cardiothoracic Vascular Surgery)

## 2019-06-17 ENCOUNTER — Inpatient Hospital Stay (HOSPITAL_COMMUNITY): Payer: Medicare Other | Admitting: Certified Registered Nurse Anesthetist

## 2019-06-17 ENCOUNTER — Other Ambulatory Visit: Payer: Self-pay

## 2019-06-17 ENCOUNTER — Encounter (HOSPITAL_COMMUNITY)
Admission: RE | Disposition: A | Discharge status: Home Health | Payer: Self-pay | Source: Home / Self Care | Attending: Thoracic Surgery (Cardiothoracic Vascular Surgery)

## 2019-06-17 ENCOUNTER — Inpatient Hospital Stay (HOSPITAL_COMMUNITY): Payer: Medicare Other

## 2019-06-17 ENCOUNTER — Encounter (HOSPITAL_COMMUNITY): Payer: Self-pay | Admitting: Thoracic Surgery (Cardiothoracic Vascular Surgery)

## 2019-06-17 ENCOUNTER — Encounter: Payer: Self-pay | Admitting: Internal Medicine

## 2019-06-17 DIAGNOSIS — J432 Centrilobular emphysema: Secondary | ICD-10-CM | POA: Diagnosis present

## 2019-06-17 DIAGNOSIS — T8182XA Emphysema (subcutaneous) resulting from a procedure, initial encounter: Secondary | ICD-10-CM | POA: Diagnosis not present

## 2019-06-17 DIAGNOSIS — J449 Chronic obstructive pulmonary disease, unspecified: Secondary | ICD-10-CM | POA: Diagnosis not present

## 2019-06-17 DIAGNOSIS — I739 Peripheral vascular disease, unspecified: Secondary | ICD-10-CM | POA: Diagnosis present

## 2019-06-17 DIAGNOSIS — Z4682 Encounter for fitting and adjustment of non-vascular catheter: Secondary | ICD-10-CM | POA: Diagnosis not present

## 2019-06-17 DIAGNOSIS — M5416 Radiculopathy, lumbar region: Secondary | ICD-10-CM | POA: Diagnosis present

## 2019-06-17 DIAGNOSIS — Z87891 Personal history of nicotine dependence: Secondary | ICD-10-CM

## 2019-06-17 DIAGNOSIS — I10 Essential (primary) hypertension: Secondary | ICD-10-CM | POA: Diagnosis not present

## 2019-06-17 DIAGNOSIS — J439 Emphysema, unspecified: Secondary | ICD-10-CM | POA: Diagnosis not present

## 2019-06-17 DIAGNOSIS — G2581 Restless legs syndrome: Secondary | ICD-10-CM | POA: Diagnosis present

## 2019-06-17 DIAGNOSIS — J95811 Postprocedural pneumothorax: Secondary | ICD-10-CM | POA: Diagnosis not present

## 2019-06-17 DIAGNOSIS — J90 Pleural effusion, not elsewhere classified: Secondary | ICD-10-CM

## 2019-06-17 DIAGNOSIS — R0602 Shortness of breath: Secondary | ICD-10-CM

## 2019-06-17 DIAGNOSIS — D62 Acute posthemorrhagic anemia: Secondary | ICD-10-CM | POA: Diagnosis not present

## 2019-06-17 DIAGNOSIS — Z881 Allergy status to other antibiotic agents status: Secondary | ICD-10-CM | POA: Diagnosis not present

## 2019-06-17 DIAGNOSIS — J9571 Accidental puncture and laceration of a respiratory system organ or structure during a respiratory system procedure: Secondary | ICD-10-CM | POA: Diagnosis not present

## 2019-06-17 DIAGNOSIS — K219 Gastro-esophageal reflux disease without esophagitis: Secondary | ICD-10-CM | POA: Diagnosis present

## 2019-06-17 DIAGNOSIS — R911 Solitary pulmonary nodule: Secondary | ICD-10-CM | POA: Diagnosis not present

## 2019-06-17 DIAGNOSIS — J9382 Other air leak: Secondary | ICD-10-CM | POA: Diagnosis not present

## 2019-06-17 DIAGNOSIS — T797XXA Traumatic subcutaneous emphysema, initial encounter: Secondary | ICD-10-CM

## 2019-06-17 DIAGNOSIS — C3411 Malignant neoplasm of upper lobe, right bronchus or lung: Principal | ICD-10-CM | POA: Diagnosis present

## 2019-06-17 DIAGNOSIS — C771 Secondary and unspecified malignant neoplasm of intrathoracic lymph nodes: Secondary | ICD-10-CM | POA: Diagnosis not present

## 2019-06-17 DIAGNOSIS — J942 Hemothorax: Secondary | ICD-10-CM

## 2019-06-17 DIAGNOSIS — Z972 Presence of dental prosthetic device (complete) (partial): Secondary | ICD-10-CM | POA: Diagnosis not present

## 2019-06-17 DIAGNOSIS — Z09 Encounter for follow-up examination after completed treatment for conditions other than malignant neoplasm: Secondary | ICD-10-CM

## 2019-06-17 DIAGNOSIS — M1990 Unspecified osteoarthritis, unspecified site: Secondary | ICD-10-CM | POA: Diagnosis present

## 2019-06-17 DIAGNOSIS — Z902 Acquired absence of lung [part of]: Secondary | ICD-10-CM

## 2019-06-17 DIAGNOSIS — J939 Pneumothorax, unspecified: Secondary | ICD-10-CM | POA: Diagnosis not present

## 2019-06-17 DIAGNOSIS — Z20822 Contact with and (suspected) exposure to covid-19: Secondary | ICD-10-CM | POA: Diagnosis present

## 2019-06-17 DIAGNOSIS — J9811 Atelectasis: Secondary | ICD-10-CM | POA: Diagnosis not present

## 2019-06-17 DIAGNOSIS — C3490 Malignant neoplasm of unspecified part of unspecified bronchus or lung: Secondary | ICD-10-CM | POA: Diagnosis not present

## 2019-06-17 DIAGNOSIS — Z9689 Presence of other specified functional implants: Secondary | ICD-10-CM

## 2019-06-17 DIAGNOSIS — R918 Other nonspecific abnormal finding of lung field: Secondary | ICD-10-CM | POA: Diagnosis not present

## 2019-06-17 HISTORY — DX: Adhesive capsulitis of unspecified shoulder: M75.00

## 2019-06-17 HISTORY — DX: Unspecified hearing loss, unspecified ear: H91.90

## 2019-06-17 HISTORY — PX: INTERCOSTAL NERVE BLOCK: SHX5021

## 2019-06-17 LAB — POCT I-STAT 7, (LYTES, BLD GAS, ICA,H+H)
Acid-base deficit: 3 mmol/L — ABNORMAL HIGH (ref 0.0–2.0)
Bicarbonate: 26.1 mmol/L (ref 20.0–28.0)
Calcium, Ion: 1.2 mmol/L (ref 1.15–1.40)
HCT: 42 % (ref 39.0–52.0)
Hemoglobin: 14.3 g/dL (ref 13.0–17.0)
O2 Saturation: 100 %
Potassium: 4.5 mmol/L (ref 3.5–5.1)
Sodium: 139 mmol/L (ref 135–145)
TCO2: 28 mmol/L (ref 22–32)
pCO2 arterial: 62.7 mmHg — ABNORMAL HIGH (ref 32.0–48.0)
pH, Arterial: 7.227 — ABNORMAL LOW (ref 7.350–7.450)
pO2, Arterial: 273 mmHg — ABNORMAL HIGH (ref 83.0–108.0)

## 2019-06-17 LAB — PREPARE RBC (CROSSMATCH)

## 2019-06-17 SURGERY — LOBECTOMY, LUNG, ROBOT-ASSISTED, USING VATS
Anesthesia: General | Site: Chest | Laterality: Right

## 2019-06-17 MED ORDER — FENTANYL CITRATE (PF) 100 MCG/2ML IJ SOLN
INTRAMUSCULAR | Status: AC
  Filled 2019-06-17: qty 2

## 2019-06-17 MED ORDER — LEVALBUTEROL HCL 0.63 MG/3ML IN NEBU
0.6300 mg | INHALATION_SOLUTION | Freq: Three times a day (TID) | RESPIRATORY_TRACT | Status: DC
  Administered 2019-06-17: 0.63 mg via RESPIRATORY_TRACT
  Filled 2019-06-17: qty 3

## 2019-06-17 MED ORDER — MEPERIDINE HCL 25 MG/ML IJ SOLN: 6.2500 mg | INTRAMUSCULAR | Status: DC | PRN

## 2019-06-17 MED ORDER — VALSARTAN-HYDROCHLOROTHIAZIDE 80-12.5 MG PO TABS: 1.0000 | ORAL_TABLET | Freq: Every day | ORAL | Status: DC

## 2019-06-17 MED ORDER — SODIUM CHLORIDE 0.9 % IV SOLN: INTRAVENOUS | Status: DC | PRN

## 2019-06-17 MED ORDER — KETOROLAC TROMETHAMINE 15 MG/ML IJ SOLN
15.0000 mg | Freq: Four times a day (QID) | INTRAMUSCULAR | Status: AC
  Administered 2019-06-17 – 2019-06-19 (×8): 15 mg via INTRAVENOUS
  Filled 2019-06-17 (×8): qty 1

## 2019-06-17 MED ORDER — ACETAMINOPHEN 10 MG/ML IV SOLN
INTRAVENOUS | Status: AC
  Filled 2019-06-17: qty 100

## 2019-06-17 MED ORDER — ALBUMIN HUMAN 5 % IV SOLN: INTRAVENOUS | Status: DC | PRN

## 2019-06-17 MED ORDER — CEFAZOLIN SODIUM-DEXTROSE 2-4 GM/100ML-% IV SOLN
2.0000 g | Freq: Three times a day (TID) | INTRAVENOUS | Status: AC
  Administered 2019-06-17 (×2): 2 g via INTRAVENOUS
  Filled 2019-06-17 (×2): qty 100

## 2019-06-17 MED ORDER — PHENYLEPHRINE HCL (PRESSORS) 10 MG/ML IV SOLN
INTRAVENOUS | Status: DC | PRN
Start: 2019-06-17 — End: 2019-06-17
  Administered 2019-06-17 (×3): 80 ug via INTRAVENOUS

## 2019-06-17 MED ORDER — PROPOFOL 10 MG/ML IV BOLUS
INTRAVENOUS | Status: DC | PRN
  Administered 2019-06-17: 120 mg via INTRAVENOUS

## 2019-06-17 MED ORDER — FENTANYL CITRATE (PF) 250 MCG/5ML IJ SOLN
INTRAMUSCULAR | Status: AC
  Filled 2019-06-17: qty 5

## 2019-06-17 MED ORDER — PHENYLEPHRINE HCL-NACL 10-0.9 MG/250ML-% IV SOLN
INTRAVENOUS | Status: DC | PRN
  Administered 2019-06-17: 25 ug/min via INTRAVENOUS

## 2019-06-17 MED ORDER — PROMETHAZINE HCL 25 MG/ML IJ SOLN: 6.2500 mg | INTRAMUSCULAR | Status: DC | PRN

## 2019-06-17 MED ORDER — ENOXAPARIN SODIUM 40 MG/0.4ML ~~LOC~~ SOLN
40.0000 mg | Freq: Every day | SUBCUTANEOUS | Status: DC
  Administered 2019-06-18 – 2019-07-05 (×18): 40 mg via SUBCUTANEOUS
  Filled 2019-06-17 (×18): qty 0.4

## 2019-06-17 MED ORDER — HEMOSTATIC AGENTS (NO CHARGE) OPTIME
TOPICAL | Status: DC | PRN
  Administered 2019-06-17: 2 via TOPICAL

## 2019-06-17 MED ORDER — ACETAMINOPHEN 160 MG/5ML PO SOLN: 1000.0000 mg | Freq: Four times a day (QID) | ORAL | Status: AC

## 2019-06-17 MED ORDER — PANTOPRAZOLE SODIUM 40 MG PO TBEC
40.0000 mg | DELAYED_RELEASE_TABLET | Freq: Every day | ORAL | Status: DC
  Administered 2019-06-17 – 2019-07-06 (×19): 40 mg via ORAL
  Filled 2019-06-17 (×19): qty 1

## 2019-06-17 MED ORDER — ORAL CARE MOUTH RINSE: 15.0000 mL | Freq: Once | OROMUCOSAL | Status: AC

## 2019-06-17 MED ORDER — CHLORHEXIDINE GLUCONATE 0.12 % MT SOLN
OROMUCOSAL | Status: AC
  Administered 2019-06-17: 15 mL via OROMUCOSAL
  Filled 2019-06-17: qty 15

## 2019-06-17 MED ORDER — ROCURONIUM BROMIDE 10 MG/ML (PF) SYRINGE
PREFILLED_SYRINGE | INTRAVENOUS | Status: DC | PRN
  Administered 2019-06-17 (×3): 20 mg via INTRAVENOUS
  Administered 2019-06-17: 50 mg via INTRAVENOUS

## 2019-06-17 MED ORDER — ACETAMINOPHEN 500 MG PO TABS
1000.0000 mg | ORAL_TABLET | Freq: Four times a day (QID) | ORAL | Status: AC
  Administered 2019-06-17 – 2019-06-22 (×19): 1000 mg via ORAL
  Filled 2019-06-17 (×20): qty 2

## 2019-06-17 MED ORDER — LACTATED RINGERS IV SOLN: INTRAVENOUS | Status: DC

## 2019-06-17 MED ORDER — ACETAMINOPHEN 160 MG/5ML PO SOLN: 325.0000 mg | Freq: Once | ORAL | Status: DC | PRN

## 2019-06-17 MED ORDER — SODIUM CHLORIDE 0.9 % IV SOLN
INTRAVENOUS | Status: AC | PRN
  Administered 2019-06-17: 1000 mL via INTRAMUSCULAR

## 2019-06-17 MED ORDER — SUGAMMADEX SODIUM 200 MG/2ML IV SOLN
INTRAVENOUS | Status: DC | PRN
  Administered 2019-06-17: 200 mg via INTRAVENOUS

## 2019-06-17 MED ORDER — LACTATED RINGERS IV SOLN: Freq: Once | INTRAVENOUS | Status: DC

## 2019-06-17 MED ORDER — SODIUM CHLORIDE 0.9% IV SOLUTION: Freq: Once | INTRAVENOUS | Status: DC

## 2019-06-17 MED ORDER — ACETAMINOPHEN 10 MG/ML IV SOLN
1000.0000 mg | Freq: Once | INTRAVENOUS | Status: DC | PRN
  Administered 2019-06-17: 1000 mg via INTRAVENOUS

## 2019-06-17 MED ORDER — BISACODYL 5 MG PO TBEC
10.0000 mg | DELAYED_RELEASE_TABLET | Freq: Every day | ORAL | Status: DC
  Administered 2019-06-19 – 2019-06-28 (×5): 10 mg via ORAL
  Filled 2019-06-17 (×12): qty 2

## 2019-06-17 MED ORDER — CEFAZOLIN SODIUM-DEXTROSE 2-4 GM/100ML-% IV SOLN
2.0000 g | INTRAVENOUS | Status: AC
  Administered 2019-06-17: 2 g via INTRAVENOUS

## 2019-06-17 MED ORDER — SODIUM CHLORIDE 0.9 % IV SOLN: INTRAVENOUS | Status: DC

## 2019-06-17 MED ORDER — IRBESARTAN 150 MG PO TABS
75.0000 mg | ORAL_TABLET | Freq: Every day | ORAL | Status: DC
  Administered 2019-06-18 – 2019-06-30 (×13): 75 mg via ORAL
  Filled 2019-06-17 (×13): qty 1

## 2019-06-17 MED ORDER — SUCCINYLCHOLINE CHLORIDE 200 MG/10ML IV SOSY
PREFILLED_SYRINGE | INTRAVENOUS | Status: DC | PRN
  Administered 2019-06-17: 120 mg via INTRAVENOUS

## 2019-06-17 MED ORDER — ALBUTEROL SULFATE HFA 108 (90 BASE) MCG/ACT IN AERS
INHALATION_SPRAY | RESPIRATORY_TRACT | Status: DC | PRN
  Administered 2019-06-17 (×2): 2 via RESPIRATORY_TRACT

## 2019-06-17 MED ORDER — CHLORHEXIDINE GLUCONATE 0.12 % MT SOLN: 15.0000 mL | Freq: Once | OROMUCOSAL | Status: AC

## 2019-06-17 MED ORDER — ONDANSETRON HCL 4 MG/2ML IJ SOLN
INTRAMUSCULAR | Status: DC | PRN
  Administered 2019-06-17: 4 mg via INTRAVENOUS

## 2019-06-17 MED ORDER — PROPOFOL 10 MG/ML IV BOLUS
INTRAVENOUS | Status: AC
  Filled 2019-06-17: qty 40

## 2019-06-17 MED ORDER — MIDAZOLAM HCL 5 MG/5ML IJ SOLN
INTRAMUSCULAR | Status: DC | PRN
  Administered 2019-06-17: 2 mg via INTRAVENOUS

## 2019-06-17 MED ORDER — HYDROCHLOROTHIAZIDE 12.5 MG PO CAPS
12.5000 mg | ORAL_CAPSULE | Freq: Every day | ORAL | Status: DC
  Administered 2019-06-18 – 2019-06-30 (×13): 12.5 mg via ORAL
  Filled 2019-06-17 (×13): qty 1

## 2019-06-17 MED ORDER — LACTATED RINGERS IV SOLN: INTRAVENOUS | Status: DC | PRN

## 2019-06-17 MED ORDER — ONDANSETRON HCL 4 MG/2ML IJ SOLN
4.0000 mg | Freq: Four times a day (QID) | INTRAMUSCULAR | Status: DC | PRN
  Administered 2019-06-22: 4 mg via INTRAVENOUS
  Filled 2019-06-17: qty 2

## 2019-06-17 MED ORDER — BUPIVACAINE HCL (PF) 0.5 % IJ SOLN
INTRAMUSCULAR | Status: AC
  Filled 2019-06-17: qty 30

## 2019-06-17 MED ORDER — OXYCODONE HCL 5 MG PO TABS
5.0000 mg | ORAL_TABLET | ORAL | Status: DC | PRN
  Administered 2019-06-18 – 2019-06-26 (×9): 10 mg via ORAL
  Administered 2019-06-26: 5 mg via ORAL
  Administered 2019-06-26 – 2019-06-30 (×10): 10 mg via ORAL
  Administered 2019-06-30: 5 mg via ORAL
  Filled 2019-06-17 (×8): qty 2
  Filled 2019-06-17: qty 1
  Filled 2019-06-17 (×2): qty 2
  Filled 2019-06-17: qty 1
  Filled 2019-06-17 (×10): qty 2

## 2019-06-17 MED ORDER — 0.9 % SODIUM CHLORIDE (POUR BTL) OPTIME
TOPICAL | Status: DC | PRN
  Administered 2019-06-17: 2000 mL

## 2019-06-17 MED ORDER — FENTANYL CITRATE (PF) 100 MCG/2ML IJ SOLN
25.0000 ug | INTRAMUSCULAR | Status: DC | PRN
  Administered 2019-06-17: 50 ug via INTRAVENOUS

## 2019-06-17 MED ORDER — LEVALBUTEROL HCL 0.63 MG/3ML IN NEBU: 0.6300 mg | INHALATION_SOLUTION | Freq: Four times a day (QID) | RESPIRATORY_TRACT | Status: DC | PRN

## 2019-06-17 MED ORDER — TRAMADOL HCL 50 MG PO TABS
50.0000 mg | ORAL_TABLET | Freq: Four times a day (QID) | ORAL | Status: DC | PRN
  Administered 2019-06-19: 50 mg via ORAL
  Administered 2019-06-19: 100 mg via ORAL
  Administered 2019-06-19: 50 mg via ORAL
  Administered 2019-06-20 (×2): 100 mg via ORAL
  Administered 2019-06-20: 50 mg via ORAL
  Administered 2019-06-21 – 2019-06-24 (×6): 100 mg via ORAL
  Administered 2019-06-24: 50 mg via ORAL
  Filled 2019-06-17: qty 2
  Filled 2019-06-17: qty 1
  Filled 2019-06-17: qty 2
  Filled 2019-06-17: qty 1
  Filled 2019-06-17 (×5): qty 2
  Filled 2019-06-17: qty 1
  Filled 2019-06-17: qty 2
  Filled 2019-06-17: qty 1
  Filled 2019-06-17: qty 2

## 2019-06-17 MED ORDER — MIDAZOLAM HCL 2 MG/2ML IJ SOLN
INTRAMUSCULAR | Status: AC
  Filled 2019-06-17: qty 2

## 2019-06-17 MED ORDER — ACETAMINOPHEN 325 MG PO TABS: 325.0000 mg | ORAL_TABLET | Freq: Once | ORAL | Status: DC | PRN

## 2019-06-17 MED ORDER — SODIUM CHLORIDE FLUSH 0.9 % IV SOLN
INTRAVENOUS | Status: DC | PRN
  Administered 2019-06-17: 100 mL

## 2019-06-17 MED ORDER — DEXAMETHASONE SODIUM PHOSPHATE 10 MG/ML IJ SOLN
INTRAMUSCULAR | Status: DC | PRN
  Administered 2019-06-17: 5 mg via INTRAVENOUS

## 2019-06-17 MED ORDER — FENTANYL CITRATE (PF) 250 MCG/5ML IJ SOLN
INTRAMUSCULAR | Status: DC | PRN
  Administered 2019-06-17 (×3): 50 ug via INTRAVENOUS
  Administered 2019-06-17: 100 ug via INTRAVENOUS

## 2019-06-17 MED ORDER — SENNOSIDES-DOCUSATE SODIUM 8.6-50 MG PO TABS
1.0000 | ORAL_TABLET | Freq: Every day | ORAL | Status: DC
  Administered 2019-06-19 – 2019-06-29 (×9): 1 via ORAL
  Filled 2019-06-17 (×12): qty 1

## 2019-06-17 SURGICAL SUPPLY — 124 items
APPLIER CLIP ROT 10 11.4 M/L (STAPLE)
BLADE CLIPPER SURG (BLADE) IMPLANT
BLADE SURG SZ11 CARB STEEL (BLADE) ×1 IMPLANT
BNDG COHESIVE 6X5 TAN STRL LF (GAUZE/BANDAGES/DRESSINGS) IMPLANT
CANISTER SUCT 3000ML PPV (MISCELLANEOUS) ×3 IMPLANT
CANNULA REDUC XI 12-8 STAPL (CANNULA) ×4
CANNULA REDUCER 12-8 DVNC XI (CANNULA) ×2 IMPLANT
CATH THORACIC 28FR (CATHETERS) IMPLANT
CATH THORACIC 28FR RT ANG (CATHETERS) IMPLANT
CATH THORACIC 36FR (CATHETERS) IMPLANT
CATH THORACIC 36FR RT ANG (CATHETERS) IMPLANT
CLIP APPLIE ROT 10 11.4 M/L (STAPLE) IMPLANT
CLIP VESOCCLUDE MED 6/CT (CLIP) IMPLANT
CNTNR URN SCR LID CUP LEK RST (MISCELLANEOUS) ×5 IMPLANT
CONN ST 1/4X3/8  BEN (MISCELLANEOUS) ×2
CONN ST 1/4X3/8 BEN (MISCELLANEOUS) IMPLANT
CONN Y 3/8X3/8X3/8  BEN (MISCELLANEOUS) ×2
CONN Y 3/8X3/8X3/8 BEN (MISCELLANEOUS) IMPLANT
CONT SPEC 4OZ STRL OR WHT (MISCELLANEOUS) ×36
DEFOGGER SCOPE WARMER CLEARIFY (MISCELLANEOUS) ×2 IMPLANT
DERMABOND ADVANCED (GAUZE/BANDAGES/DRESSINGS) ×1
DERMABOND ADVANCED .7 DNX12 (GAUZE/BANDAGES/DRESSINGS) ×1 IMPLANT
DRAIN CHANNEL 28F RND 3/8 FF (WOUND CARE) ×1 IMPLANT
DRAIN CHANNEL 32F RND 10.7 FF (WOUND CARE) IMPLANT
DRAPE ARM DVNC X/XI (DISPOSABLE) ×4 IMPLANT
DRAPE COLUMN DVNC XI (DISPOSABLE) ×1 IMPLANT
DRAPE CV SPLIT W-CLR ANES SCRN (DRAPES) ×2 IMPLANT
DRAPE DA VINCI XI ARM (DISPOSABLE) ×8
DRAPE DA VINCI XI COLUMN (DISPOSABLE) ×2
DRAPE INCISE IOBAN 66X45 STRL (DRAPES) ×1 IMPLANT
DRAPE ORTHO SPLIT 77X108 STRL (DRAPES) ×2
DRAPE SURG ORHT 6 SPLT 77X108 (DRAPES) ×1 IMPLANT
DRAPE WARM FLUID 44X44 (DRAPES) ×2 IMPLANT
ELECT BLADE 6.5 EXT (BLADE) IMPLANT
ELECT REM PT RETURN 9FT ADLT (ELECTROSURGICAL) ×2
ELECTRODE REM PT RTRN 9FT ADLT (ELECTROSURGICAL) ×1 IMPLANT
GAUZE KITTNER 4X5 RF (MISCELLANEOUS) ×5 IMPLANT
GAUZE SPONGE 4X4 12PLY STRL (GAUZE/BANDAGES/DRESSINGS) ×2 IMPLANT
GLOVE BIOGEL PI IND STRL 6.5 (GLOVE) IMPLANT
GLOVE BIOGEL PI INDICATOR 6.5 (GLOVE) ×1
GLOVE SURG SS PI 6.5 STRL IVOR (GLOVE) ×1 IMPLANT
GLOVE TRIUMPH SURG SIZE 7.5 (KITS) ×4 IMPLANT
GOWN STRL NON-REIN LRG LVL3 (GOWN DISPOSABLE) ×1 IMPLANT
GOWN STRL REUS W/ TWL LRG LVL3 (GOWN DISPOSABLE) ×2 IMPLANT
GOWN STRL REUS W/ TWL XL LVL3 (GOWN DISPOSABLE) ×3 IMPLANT
GOWN STRL REUS W/TWL 2XL LVL3 (GOWN DISPOSABLE) ×3 IMPLANT
GOWN STRL REUS W/TWL LRG LVL3 (GOWN DISPOSABLE) ×6
GOWN STRL REUS W/TWL XL LVL3 (GOWN DISPOSABLE) ×4
HEMOSTAT SURGICEL 2X14 (HEMOSTASIS) ×5 IMPLANT
IRRIGATION STRYKERFLOW (MISCELLANEOUS) ×1 IMPLANT
IRRIGATOR STRYKERFLOW (MISCELLANEOUS) ×2
KIT BASIN OR (CUSTOM PROCEDURE TRAY) ×2 IMPLANT
KIT SUCTION CATH 14FR (SUCTIONS) IMPLANT
KIT TURNOVER KIT B (KITS) ×2 IMPLANT
LOOP VESSEL SUPERMAXI WHITE (MISCELLANEOUS) IMPLANT
NDL HYPO 25GX1X1/2 BEV (NEEDLE) ×1 IMPLANT
NDL SPNL 22GX3.5 QUINCKE BK (NEEDLE) ×1 IMPLANT
NEEDLE HYPO 25GX1X1/2 BEV (NEEDLE) ×2 IMPLANT
NEEDLE SPNL 22GX3.5 QUINCKE BK (NEEDLE) ×2 IMPLANT
NS IRRIG 1000ML POUR BTL (IV SOLUTION) ×4 IMPLANT
PACK CHEST (CUSTOM PROCEDURE TRAY) ×2 IMPLANT
PAD ARMBOARD 7.5X6 YLW CONV (MISCELLANEOUS) ×4 IMPLANT
RELOAD STAPLE 45 2.5 WHT DVNC (STAPLE) IMPLANT
RELOAD STAPLE 45 3.5 BLU DVNC (STAPLE) IMPLANT
RELOAD STAPLE 45 4.3 GRN DVNC (STAPLE) IMPLANT
RELOAD STAPLER 2.5X45 WHT DVNC (STAPLE) ×2 IMPLANT
RELOAD STAPLER 3.5X45 BLU DVNC (STAPLE) ×12 IMPLANT
RELOAD STAPLER 4.3X45 GRN DVNC (STAPLE) ×2 IMPLANT
SCISSORS LAP 5X35 DISP (ENDOMECHANICALS) IMPLANT
SEAL CANN UNIV 5-8 DVNC XI (MISCELLANEOUS) ×2 IMPLANT
SEAL XI 5MM-8MM UNIVERSAL (MISCELLANEOUS) ×4
SEALANT PROGEL (MISCELLANEOUS) IMPLANT
SEALANT SURG COSEAL 4ML (VASCULAR PRODUCTS) IMPLANT
SEALANT SURG COSEAL 8ML (VASCULAR PRODUCTS) IMPLANT
SEALER SYNCHRO 8 IS4000 DV (MISCELLANEOUS) ×2
SEALER SYNCHRO 8 IS4000 DVNC (MISCELLANEOUS) IMPLANT
SET TUBE SMOKE EVAC HIGH FLOW (TUBING) ×2 IMPLANT
SHEARS HARMONIC HDI 20CM (ELECTROSURGICAL) IMPLANT
SOLUTION ELECTROLUBE (MISCELLANEOUS) ×1 IMPLANT
SPONGE INTESTINAL PEANUT (DISPOSABLE) IMPLANT
SPONGE TONSIL TAPE 1 RFD (DISPOSABLE) IMPLANT
STAPLER 45 DA VINCI SURE FORM (STAPLE) ×2
STAPLER 45 SUREFORM CVD (STAPLE) ×2
STAPLER 45 SUREFORM CVD DVNC (STAPLE) IMPLANT
STAPLER 45 SUREFORM DVNC (STAPLE) IMPLANT
STAPLER CANNULA SEAL DVNC XI (STAPLE) ×2 IMPLANT
STAPLER CANNULA SEAL XI (STAPLE) ×4
STAPLER RELOAD 2.5X45 WHITE (STAPLE) ×4
STAPLER RELOAD 2.5X45 WHT DVNC (STAPLE) ×2
STAPLER RELOAD 3.5X45 BLU DVNC (STAPLE) ×12
STAPLER RELOAD 3.5X45 BLUE (STAPLE) ×24
STAPLER RELOAD 4.3X45 GREEN (STAPLE) ×4
STAPLER RELOAD 4.3X45 GRN DVNC (STAPLE) ×2
SUT PDS AB 3-0 SH 27 (SUTURE) IMPLANT
SUT PROLENE 4 0 RB 1 (SUTURE)
SUT PROLENE 4-0 RB1 .5 CRCL 36 (SUTURE) IMPLANT
SUT SILK  1 MH (SUTURE) ×4
SUT SILK 1 MH (SUTURE) ×1 IMPLANT
SUT SILK 1 TIES 10X30 (SUTURE) ×2 IMPLANT
SUT SILK 2 0 SH (SUTURE) ×1 IMPLANT
SUT SILK 2 0SH CR/8 30 (SUTURE) IMPLANT
SUT SILK 3 0 SH 30 (SUTURE) IMPLANT
SUT SILK 3 0SH CR/8 30 (SUTURE) IMPLANT
SUT VIC AB 1 CTX 36 (SUTURE)
SUT VIC AB 1 CTX36XBRD ANBCTR (SUTURE) IMPLANT
SUT VIC AB 2-0 CTX 36 (SUTURE) IMPLANT
SUT VIC AB 3-0 MH 27 (SUTURE) IMPLANT
SUT VIC AB 3-0 X1 27 (SUTURE) ×2 IMPLANT
SUT VICRYL 0 TIES 12 18 (SUTURE) ×2 IMPLANT
SUT VICRYL 0 UR6 27IN ABS (SUTURE) ×4 IMPLANT
SUT VICRYL 2 TP 1 (SUTURE) IMPLANT
SYR 20ML ECCENTRIC (SYRINGE) ×1 IMPLANT
SYR 30ML LL (SYRINGE) ×3 IMPLANT
SYSTEM RETRIEVAL ANCHOR 12 (MISCELLANEOUS) ×1 IMPLANT
SYSTEM SAHARA CHEST DRAIN ATS (WOUND CARE) ×2 IMPLANT
TAPE CLOTH 4X10 WHT NS (GAUZE/BANDAGES/DRESSINGS) ×2 IMPLANT
TAPE CLOTH SURG 4X10 WHT LF (GAUZE/BANDAGES/DRESSINGS) ×1 IMPLANT
TIP APPLICATOR SPRAY EXTEND 16 (VASCULAR PRODUCTS) IMPLANT
TOWEL GREEN STERILE (TOWEL DISPOSABLE) ×2 IMPLANT
TRAY FOLEY MTR SLVR 16FR STAT (SET/KITS/TRAYS/PACK) ×2 IMPLANT
TRAY WAYNE PNEUMOTHORAX 14X18 (TRAY / TRAY PROCEDURE) ×1 IMPLANT
TROCAR BLADELESS 15MM (ENDOMECHANICALS) IMPLANT
TROCAR XCEL 12X100 BLDLESS (ENDOMECHANICALS) ×2 IMPLANT
WATER STERILE IRR 1000ML POUR (IV SOLUTION) ×2 IMPLANT

## 2019-06-17 NOTE — Anesthesia Procedure Notes (Signed)
Central Venous Catheter Insertion Performed by: Effie Berkshire, MD, anesthesiologist Start/End6/03/2019 7:10 AM, 06/17/2019 7:18 AM Patient location: Pre-op. Preanesthetic checklist: patient identified, IV checked, site marked, risks and benefits discussed, surgical consent, monitors and equipment checked, pre-op evaluation, timeout performed and anesthesia consent Position: Trendelenburg Lidocaine 1% used for infiltration and patient sedated Hand hygiene performed , maximum sterile barriers used  and Seldinger technique used Catheter size: 8 Fr Total catheter length 16. Central line was placed.Double lumen Procedure performed using ultrasound guided technique. Ultrasound Notes:anatomy identified, needle tip was noted to be adjacent to the nerve/plexus identified, no ultrasound evidence of intravascular and/or intraneural injection and image(s) printed for medical record Attempts: 1 Following insertion, dressing applied, line sutured and Biopatch. Post procedure assessment: blood return through all ports  Patient tolerated the procedure well with no immediate complications.

## 2019-06-17 NOTE — Anesthesia Procedure Notes (Signed)
Arterial Line Insertion Start/End6/03/2019 7:10 AM, 06/17/2019 7:15 AM Performed by: Glynda Jaeger, CRNA, CRNA  Preanesthetic checklist: patient identified, IV checked, site marked, risks and benefits discussed, surgical consent, monitors and equipment checked, pre-op evaluation, timeout performed and anesthesia consent Patient sedated Left, radial was placed Catheter size: 20 G Hand hygiene performed  and maximum sterile barriers used  Allen's test indicative of satisfactory collateral circulation Attempts: 1 Procedure performed without using ultrasound guided technique. Following insertion, dressing applied and Biopatch. Patient tolerated the procedure well with no immediate complications.

## 2019-06-17 NOTE — Anesthesia Postprocedure Evaluation (Signed)
Anesthesia Post Note  Patient: Luis [name]  Procedure(s) Performed: XI ROBOTIC ASSISTED THORASCOPY-RIGHT UPPER LOBECTOMY (Right Chest) INTERCOSTAL NERVE BLOCK (Right Chest)     Patient location during evaluation: PACU Anesthesia Type: General Level of consciousness: awake and alert Pain management: pain level controlled Vital Signs Assessment: post-procedure vital signs reviewed and stable Respiratory status: spontaneous breathing, nonlabored ventilation, respiratory function stable and patient connected to nasal cannula oxygen Cardiovascular status: blood pressure returned to baseline and stable Postop Assessment: no apparent nausea or vomiting Anesthetic complications: no    Last Vitals:  Vitals:   06/17/19 1405 06/17/19 1430  BP: 114/63 115/67  Pulse: 71 80  Resp: 10 10  Temp: (!) 35.9 C 36.5 C  SpO2: 98% 96%    Last Pain:  Vitals:   06/17/19 1405  TempSrc:   PainSc: Asleep           Last temp 36.1      Effie Berkshire

## 2019-06-17 NOTE — Op Note (Signed)
NAME: Luis [name], Luis [name] MEDICAL RECORD [id number] ACCOUNT [id number] DATE OF BIRTH:02-28-1950 FACILITY: [facility] LOCATION: [facility] PHYSICIAN:Kilo Eshelman Chaya Jan, MD  OPERATIVE REPORT  DATE OF PROCEDURE:  06/17/2019  PREOPERATIVE DIAGNOSIS:  Right upper lobe lung nodule, suspected non-small cell lung cancer.  POSTOPERATIVE DIAGNOSIS:  Nonsmall cell carcinoma, right upper lobe, clinical stage IA (T1, N0).  PROCEDURES:   1.  Xi robotic-assisted right upper lobectomy.  2.  Lymph node dissection.  3.  Intercostal nerve blocks levels 3 through 10.  SURGEON:  Modesto Charon, MD  ASSISTANT:  Jadene Pierini, PA-C  ANESTHESIA:  General.  FINDINGS:  Frozen section revealed nonsmall cell carcinoma.  Bronchial margin was free of tumor.  Severe emphysematous changes in the lung.  Incomplete fissures.  Benign appearing lymph nodes.  CLINICAL NOTE:  Luis [name] is a 69 year old gentleman with a history of tobacco abuse and COPD, who had a lung nodule found on a low-dose CT done for lung cancer screening.  This was a highly suspicious nodule.  A PET CT showed the nodule was  hypermetabolic with an SUV of 6.4.  He was referred for possible surgical resection.  His pulmonary function testing did show adequate function to tolerate a lobectomy, but he understood he would have significant limitations postoperatively.  The patient  was advised of other alternatives and wished to pursue surgery.  The indications, risks, benefits and alternatives were discussed in detail with the patient.  He accepted the risks and agreed to proceed.  OPERATIVE NOTE:  Luis [name] was brought to the preoperative holding area on 06/17/2019.  Anesthesia placed a central venous catheter and an arterial blood pressure monitoring line.  He was taken to the operating room, anesthetized and intubated with a  double lumen endotracheal tube.  Intravenous antibiotics were administered.  Sequential compression devices were placed on the  calves for DVT prophylaxis.  A Foley catheter was placed.  He was placed in a left lateral decubitus position and the right  chest was prepped and draped in the usual sterile fashion.  Single-lung ventilation of the left lung was initiated and was tolerated well throughout the procedure.  A timeout was performed.  A solution containing 20 mL of liposomal bupivacaine, 30 mL of 0.5% bupivacaine and 50 mL of saline was prepared.  This was used for injection of the local at the incisions as well as for the intercostal nerve blocks.  An  incision was made in the 8th interspace in the mid axillary line.  An 8 mm port was inserted.  The thoracoscope was advanced into the chest.  Twelve mm ports were placed 5 cm anterior and posterior to this.  The anterior most port was placed in the 7th  interspace.  An additional 8 mm port was placed more posteriorly for the retraction arm.  A 12 mm assistant port was placed in the 10th interspace centered between the 2 anterior ports.  Intercostal nerve blocks were performed from the 3rd to the 10th  interspaces.  Ten mL of the bupivacaine solution was injected at each level into a subpleural plane.  The robot was deployed and the camera was docked.  Targeting was performed.  The remaining ports were docked.  The instruments were inserted with thoracoscopic visualization.  The lung was retracted superiorly and the inferior pulmonary ligament was  divided with bipolar cautery.  All lymph nodes that were encountered during the dissection were removed and sent as separate specimens for permanent pathology.  All appeared grossly benign.  The  pleural reflection was divided at the hilum posteriorly and  level 7 and level 11 nodes were removed.  The pleural reflection was divided superiorly.  Level 10 node was removed.  The pleural reflection was divided over the mediastinum superior to the azygos vein and level 4R and 2R nodes were removed.  Inspecting the fissure, the fissure was  incomplete.  Initially, an attempt was made to dissect into the major fissure and identify the pulmonary artery from that direction.  Then a 45 mm stapler was fired twice between the middle and lower lobes.  Even  then, it became clear that it was going to be very difficult to identify the pulmonary artery in the fissure without creating significant air leaks.  The decision then was made to proceed with the lobectomy, working from superiorly to inferiorly.  The  lung was retracted posteriorly.  The pleural reflection was divided at the hilum anteriorly.  The superior pulmonary vein was identified.  The middle lobe branch was identified and preserved.  The upper lobe branches were dissected out, encircled and  divided with the robotic stapler using a vascular cartridge.  This exposed the main pulmonary artery.  There was a very small branch, which appeared to be the only posterior ascending branch.  This vessel was too small to divide with the stapler and a  SynchroSeal device was used to divide this vessel as it was less than 5 mm in diameter.  Level 10 and 12 nodes then were removed and a large common trunk to the anterior and apical segments was identified.  This was dissected out.  Once an instrument  could be passed behind it, a vessel loop was passed and the vascular stapler then was placed across this branch, closed and fired.  The minor fissure was incomplete.  This was completed with sequential firings of the robotic stapler using blue  cartridges.  This was divided to the level of the main pulmonary artery.  The upper lobe bronchus then was identified.  It was encircled and the stapler was placed across the upper lobe bronchus and closed.  A green cartridge was used for the bronchus.   A test inflation showed good aeration of the lower and middle lobes.  The stapler was fired, transecting the bronchus.  The lobectomy then was completed with sequential firings of the robotic stapler to divide the  major fissure between the superior  segment and the upper lobe using a combination of green and blue cartridges.  There was a small tear in the lower lobe that was due to the initial trocar placement.  This was stapled and the middle lobe was tacked to the lower lobe by firing the stapler  as well.  The upper lobe was placed into an endoscopic retrieval bag and pulled down inferiorly into the chest.  The robot was undocked.  The anterior 8th interspace incision was lengthened slightly and the specimen was removed.  It was sent for frozen  section of the mass and the margin.  The nodule turned out to be a nonsmall cell carcinoma.  The bronchial margin was negative for tumor.  The chest was copiously irrigated with warm saline.  A test inflation at 30 cm of water revealed no leakage from  the bronchial stump.  There was some leakage around the staple lines, particularly along the superior segmental staple line.  A 28-French Blake drain was placed through the original port incision, directed to the apex and secured with a 1 silk  suture.  A  14-French pigtail catheter was placed more anteriorly using modified Seldinger technique but with direct visualization with the scope and it was placed apically as well.  Both were secured with 0 silk sutures.  Dual-lung ventilation was resumed.  The  remaining incisions were closed with 0 Vicryl fascial sutures and 3-0 Vicryl subcuticular sutures.  Dermabond was applied.  Chest tubes were placed to a Pleur-evac on waterseal.  The patient then was extubated in the operating room and taken to the  [place] [facility] in good condition.  VN/NUANCE  D:06/17/2019 T:06/17/2019 JOB:[id number]

## 2019-06-17 NOTE — Interval H&P Note (Signed)
History and Physical Interval Note:  06/17/2019 7:56 AM  Luis [name]  has presented today for surgery, with the diagnosis of RUL LUNG NODULE.  The various methods of treatment have been discussed with the patient and family. After consideration of risks, benefits and other options for treatment, the patient has consented to  Procedure(s): XI ROBOTIC ASSISTED THORASCOPY-RIGHT UPPER LOBECTOMY (Right) as a surgical intervention.  The patient's history has been reviewed, patient examined, no change in status, stable for surgery.  I have reviewed the patient's chart and labs.  Questions were answered to the patient's satisfaction.     [name]

## 2019-06-17 NOTE — Anesthesia Procedure Notes (Signed)
Procedure Name: Intubation Date/Time: 06/17/2019 8:16 AM Performed by: Glynda Jaeger, CRNA Pre-anesthesia Checklist: Patient identified, Patient being monitored, Timeout performed, Emergency Drugs available and Suction available Patient Re-evaluated:Patient Re-evaluated prior to induction Oxygen Delivery Method: Circle System Utilized Preoxygenation: Pre-oxygenation with 100% oxygen Induction Type: IV induction Laryngoscope Size: Mac and 4 Grade View: Grade I Tube type: Oral Endobronchial tube: Double lumen EBT and 39 Fr Number of attempts: 1 Airway Equipment and Method: Stylet Placement Confirmation: ETT inserted through vocal cords under direct vision,  positive ETCO2 and breath sounds checked- equal and bilateral Tube secured with: Tape Dental Injury: Teeth and Oropharynx as per pre-operative assessment

## 2019-06-17 NOTE — Transfer of Care (Signed)
Immediate Anesthesia Transfer of Care Note  Patient: Luis [name]  Procedure(s) Performed: XI ROBOTIC ASSISTED THORASCOPY-RIGHT UPPER LOBECTOMY (Right Chest) INTERCOSTAL NERVE BLOCK (Right Chest)  Patient Location: PACU  Anesthesia Type:General  Level of Consciousness: awake, alert , patient cooperative and responds to stimulation  Airway & Oxygen Therapy: Patient Spontanous Breathing and Patient connected to face mask oxygen  Post-op Assessment: Report given to RN, Post -op Vital signs reviewed and stable and Patient moving all extremities X 4  Post vital signs: Reviewed and stable  Last Vitals:  Vitals Value Taken Time  BP 128/110 06/17/19 1235  Temp    Pulse 73 06/17/19 1242  Resp 14 06/17/19 1242  SpO2 100 % 06/17/19 1242  Vitals shown include unvalidated device data.  Last Pain:  Vitals:   06/17/19 0641  TempSrc:   PainSc: 0-No pain      Patients Stated Pain Goal: 3 (02/25/15 3567)  Complications: No apparent anesthesia complications

## 2019-06-17 NOTE — Brief Op Note (Signed)
06/17/2019  11:50 AM  PATIENT:  Luis [name]  69 y.o. male  PRE-OPERATIVE DIAGNOSIS:  RIGHT UPPER LOBE LUNG NODULE  POST-OPERATIVE DIAGNOSIS:  RIGHT UPPER LOBE LUNG NODULE  PROCEDURE:  Procedure(s): XI ROBOTIC ASSISTED THORASCOPY-RIGHT UPPER LOBECTOMY (Right) INTERCOSTAL NERVE BLOCK (Right)  SURGEON:  Surgeon(s) and Role:    * [name], MD - Primary  PHYSICIAN ASSISTANT: Keyontay Stolz PA-C  ANESTHESIA:   general  EBL:  50 mL   BLOOD ADMINISTERED:none  DRAINS: 1 BLAKE DRAIN, 1 PIGTAIL    LOCAL MEDICATIONS USED:  EXPAREL  SPECIMEN:  Source of Specimen:  RUL, MULTIPLE LN'S  DISPOSITION OF SPECIMEN:  PATHOLOGY  COUNTS:  YES  TOURNIQUET:  * No tourniquets in log *  DICTATION: .Other Dictation: Dictation Number PENDING  PLAN OF CARE: Admit to inpatient   PATIENT DISPOSITION:  PACU - hemodynamically stable.   Delay start of Pharmacological VTE agent (>24hrs) due to surgical blood loss or risk of bleeding: yes  COMPLICATIONS: NO KNOWN

## 2019-06-18 ENCOUNTER — Inpatient Hospital Stay (HOSPITAL_COMMUNITY): Payer: Medicare Other

## 2019-06-18 ENCOUNTER — Encounter (HOSPITAL_COMMUNITY): Payer: Self-pay | Admitting: Certified Registered Nurse Anesthetist

## 2019-06-18 LAB — BLOOD GAS, ARTERIAL
Acid-Base Excess: 0.6 mmol/L (ref 0.0–2.0)
Bicarbonate: 25.1 mmol/L (ref 20.0–28.0)
Drawn by: 41422
FIO2: 24
O2 Saturation: 97.6 %
Patient temperature: 36.6
pCO2 arterial: 42.5 mmHg (ref 32.0–48.0)
pH, Arterial: 7.387 (ref 7.350–7.450)
pO2, Arterial: 92 mmHg (ref 83.0–108.0)

## 2019-06-18 LAB — CBC
HCT: 36.3 % — ABNORMAL LOW (ref 39.0–52.0)
Hemoglobin: 12.6 g/dL — ABNORMAL LOW (ref 13.0–17.0)
MCH: 31.2 pg (ref 26.0–34.0)
MCHC: 34.7 g/dL (ref 30.0–36.0)
MCV: 89.9 fL (ref 80.0–100.0)
Platelets: 185 10*3/uL (ref 150–400)
RBC: 4.04 MIL/uL — ABNORMAL LOW (ref 4.22–5.81)
RDW: 12.5 % (ref 11.5–15.5)
WBC: 11.3 10*3/uL — ABNORMAL HIGH (ref 4.0–10.5)
nRBC: 0 % (ref 0.0–0.2)

## 2019-06-18 LAB — BASIC METABOLIC PANEL
Anion gap: 6 (ref 5–15)
BUN: 17 mg/dL (ref 8–23)
CO2: 26 mmol/L (ref 22–32)
Calcium: 7.7 mg/dL — ABNORMAL LOW (ref 8.9–10.3)
Chloride: 102 mmol/L (ref 98–111)
Creatinine, Ser: 1.17 mg/dL (ref 0.61–1.24)
GFR calc Af Amer: >60 mL/min (ref >60)
GFR calc non Af Amer: >60 mL/min (ref >60)
Glucose, Bld: 97 mg/dL (ref 70–99)
Potassium: 4.1 mmol/L (ref 3.5–5.1)
Sodium: 134 mmol/L — ABNORMAL LOW (ref 135–145)

## 2019-06-18 MED ORDER — MORPHINE SULFATE (PF) 2 MG/ML IV SOLN
2.0000 mg | INTRAVENOUS | Status: DC | PRN
  Administered 2019-06-18 – 2019-06-26 (×6): 2 mg via INTRAVENOUS
  Filled 2019-06-18 (×6): qty 1

## 2019-06-18 MED ORDER — LEVALBUTEROL HCL 0.63 MG/3ML IN NEBU
0.6300 mg | INHALATION_SOLUTION | Freq: Four times a day (QID) | RESPIRATORY_TRACT | Status: DC | PRN
  Administered 2019-06-25 – 2019-06-30 (×5): 0.63 mg via RESPIRATORY_TRACT
  Filled 2019-06-18 (×6): qty 3

## 2019-06-18 MED ORDER — LEVALBUTEROL HCL 0.63 MG/3ML IN NEBU
0.6300 mg | INHALATION_SOLUTION | Freq: Four times a day (QID) | RESPIRATORY_TRACT | Status: DC
  Administered 2019-06-18 (×3): 0.63 mg via RESPIRATORY_TRACT
  Filled 2019-06-18 (×3): qty 3

## 2019-06-18 MED ORDER — LEVALBUTEROL HCL 0.63 MG/3ML IN NEBU: 0.6300 mg | INHALATION_SOLUTION | Freq: Three times a day (TID) | RESPIRATORY_TRACT | Status: DC

## 2019-06-18 MED ORDER — SODIUM CHLORIDE 0.9% FLUSH
10.0000 mL | Freq: Two times a day (BID) | INTRAVENOUS | Status: DC
  Administered 2019-06-18 – 2019-06-30 (×24): 10 mL

## 2019-06-18 MED ORDER — LEVALBUTEROL HCL 0.63 MG/3ML IN NEBU
0.6300 mg | INHALATION_SOLUTION | Freq: Three times a day (TID) | RESPIRATORY_TRACT | Status: DC
  Administered 2019-06-19 – 2019-06-21 (×6): 0.63 mg via RESPIRATORY_TRACT
  Filled 2019-06-18 (×7): qty 3

## 2019-06-18 MED ORDER — SODIUM CHLORIDE 0.9% FLUSH: 10.0000 mL | INTRAVENOUS | Status: DC | PRN

## 2019-06-18 MED ORDER — CHLORHEXIDINE GLUCONATE CLOTH 2 % EX PADS
6.0000 | MEDICATED_PAD | Freq: Every day | CUTANEOUS | Status: DC
  Administered 2019-06-18 – 2019-06-20 (×3): 6 via TOPICAL

## 2019-06-18 NOTE — Discharge Summary (Signed)
Physician Discharge Summary  [name] ID: Luis [name] MRN: [id number] DOB/AGE: [birth year] 68 y.o.  Admit date: 06/17/2019 Discharge date: 07/06/2019  Admission Diagnoses: Right upper lobe lung mass  Discharge Diagnoses:  1. S/p robotic assisted RUL, LN dissection, intercostal nerve block 2. Non small cell carcinoma RUL  [name] Active Problem List   Diagnosis Date Noted  . S/P lobectomy of lung 06/17/2019  . COPD GOLD 3 with reversibility 05/06/2019  . Solitary pulmonary nodule on lung CT 05/06/2019  . Atherosclerosis of native arteries of Luis extremities with intermittent claudication 07/13/2012  . Pain in limb-Bilateral thigh 07/13/2012  . Tingling-Bilateral leg 07/13/2012   HPI: at time of consultation  Luis [name] is sent for consultation regarding a right upper lobe lung nodule.  Luis [name] is a 69 year old man with a past medical history significant for tobacco abuse, COPD, chronic bronchitis, reflux, lumbar radiculopathy, restless legs, and arthritis.  Luis [name] recently saw Dr. Inda Merlin.  A low-dose CT scan was ordered for lung cancer screening due to his history of tobacco abuse.  It showed a spiculated right upper lobe lung nodule.  Luis [name] was referred to Dr. Melvyn Novas.  A PET/CT showed Luis nodule was hypermetabolic with an SUV of 6.4  Luis [name] quit smoking in 2011.  Luis [name] smoked 2 packs/day for 40 years prior to that.  Luis [name] denies any chest pain, pressure, or tightness at rest or with exertion.  Luis [name] does have some reflux.  Luis [name] has a cough which is occasionally productive of clear sputum.  Luis [name] denies wheezing.  Luis [name] complained about Luis pricing of Stiolto and is unable to afford that medication.  Luis [name] has chronic headaches which have not changed recently.  Luis [name] does get short of breath with exertion.  Luis [name] is a little difficult to pin down exactly but can walk up a flight of stairs without stopping.  Luis [name] can walk a block on level ground without stopping.  Luis [name] and his studies were reviewed and evaluated by  Dr. Roxan Hockey who recommended admission electively for robotic assisted surgical resection.  Luis [name] was admitted this hospitalization for Luis procedure.  PATHOLOGY: FINAL MICROSCOPIC DIAGNOSIS:   A. LUNG, RIGHT UPPER LOBE, LOBECTOMY:  - Invasive poorly differentiated adenocarcinoma, 1.9 cm.  - No visceral pleura invasion identified.  - Lymphovascular invasion is present.  - Margins of resection are not involved.  - See oncology table.   B. LYMPH NODE, LEVEL 9R #1, BIOPSY:  - One lymph node, negative for carcinoma (0/1).   C. LYMPH NODE, LEVEL 9R #2, BIOPSY:  - One lymph node, negative for carcinoma (0/1).   D. LYMPH NODE, LEVEL 11R, BIOPSY:  - One lymph node, negative for carcinoma (0/1).   E. LYMPH NODE, LEVEL 7, BIOPSY:  - One lymph node, negative for carcinoma (0/1).   F. LYMPH NODE, LEVEL 7 #2, BIOPSY:  - One lymph node, negative for carcinoma (0/1).   G. LYMPH NODE, LEVEL 7 #3, BIOPSY:  - One lymph node, negative for carcinoma (0/1).   H. LYMPH NODE, LEVEL 10R, BIOPSY:  - One lymph node, negative for carcinoma (0/1).   I. LYMPH NODE, LEVEL 4R, BIOPSY:  - One lymph node, negative for carcinoma (0/1).   J. LYMPH NODE, LEVEL 4R #2, BIOPSY:  - One lymph node, negative for carcinoma (0/1).   K. LYMPH NODE, LEVEL 2R, BIOPSY:  - One lymph node, negative for carcinoma (0/1).   L. LYMPH NODE, LEVEL 10R #2, BIOPSY:  - Fibroadipose tissue.  - No distinct nodal  tissue present for evaluation.   M. LYMPH NODE, LEVEL 10R #3, BIOPSY:  - Metastatic carcinoma in (1) of (1) lymph node.   N. LYMPH NODE, LEVEL 10R #4, BIOPSY:  - Metastatic carcinoma in (1) of (1) lymph node.   O. LYMPH NODE, LEVEL 10R #5, BIOPSY:  - One lymph node, negative for carcinoma (0/1).   P. LYMPH NODE, LEVEL 12R, BIOPSY:  - One lymph node, negative for carcinoma (0/1).   Q. LYMPH NODE, LEVEL 12R #2, BIOPSY:  - One lymph node, negative for carcinoma (0/1).   R. LYMPH NODE, LEVEL 12R #3, BIOPSY:  -  One lymph node, negative for carcinoma (0/1).   TNM Code: pT1b, pN1   Discharged Condition: stable  Hospital Course: Luis [name] was admitted electively and on 06/17/2019 taken Luis operating room where Luis [name] underwent Luis below described procedure.  Luis [name] tolerated well and was taken to Luis postanesthesia care unit in stable condition.  Postoperative hospital course:  Overall ,Luis [name] has done well.  On postoperative day #1 Luis [name] did have a large air leak in chest x-ray revealed a moderate sized pneumothorax.  Luis [name] was placed on suction at that time.  Luis [name] has required aggressive pulmonary toilet as well as nebulizers for significant longstanding COPD. I was notified Luis evening of 06/04 that there was audible leaking from around right chest tube. Xeroform dressing was applied and 4x4s applied and leak stopped. Luis [name] was given both Reglan and a laxative for specific complaints, which helped. Chest x rays remained stable and air leak did resolve. Chest tube was removed on 06/07. Pigtail's suction was gradually decreased. Luis [name] continued to have an air leak. Pigtail was placed to water seal on 06/09. Follow up chest xray showed new right basilar pneumothorax and slight increase in small apical pneumothorax. Pigtail was placed back to suction. Luis [name] was still requiring several liters of oxygen via [place] and may need oxygen at discharge. It was decided to do a bronchoscopy and place inter bronchial valves because of Luis persistent air leak and this was done on 06/11. Luis [name] had an air leak. Suction was gradually decreased over Luis next few days and chest x rays remained stable;however, on 06/14 around 5 pm, [name] had increased subcutaneous emphysema bilateral neck, chest, arms so suction was increased from -10 to -20 cm. Follow up chest x ray showed no pneumothorax and increased bilateral subcutaneous emphysema. Chest tube was again placed to water seal on 06/15. [name] had increased subcutaneous emphysema and CXR showed right apical  and basilar pneumothorax. Chest tube placed back to suction. Dr. Roxan Hockey discussed Luis need for right VATS and repair of air leak. This surgery was done on 07/01/2019. Chest tube was to water seal and Luis [name] had no air leak on 06/18. Luis [name] was unable to urinate initially. Luis [name] was given IVF and bolus and this resolved. Pigtail chest tube was removed on 06/21. Follow up chest x ray showed no pneumothorax and atelectasis right base. PA/LAT CXR on 06/22 showed persistent patchy opacity RLL (some improvement in aeration), no pneumothorax. Luis nurse checked his oxygenation on room air and with ambulation and Luis [name] will not need oxygen at discharge. Per Dr. Roxan Hockey, Luis [name] will be given Ativan to take PRN. Luis [name] is felt surgically stable for discharge today.  Consults: None  Significant Diagnostic Studies: Routine postop serial labs and chest x-rays.  Treatments: surgery:  OPERATIVE REPORT  DATE OF PROCEDURE:  06/17/2019  PREOPERATIVE DIAGNOSIS:  Right upper lobe lung nodule, suspected non-small cell lung  cancer.  POSTOPERATIVE DIAGNOSIS:  Nonsmall cell carcinoma, right upper lobe, clinical stage IA (T1, N0).  PROCEDURES:   1.  Xi robotic-assisted right upper lobectomy.  2.  Lymph node dissection.  3.  Intercostal nerve blocks at levels 3 through 10.  SURGEON:  Modesto Charon, MD  ASSISTANT:  Jadene Pierini  ANESTHESIA:  General.  VIDEO BRONCHOSCOPY WITH INSERTION OF INTERBRONCHIAL VALVE (IBV) (N/A) x 2 by Dr. Roxan Hockey on 06/25/2019.  1.  Right video-assisted thoracoscopy for stapling of air leak.   2.  Intercostal nerve blocks at levels 3 through 10 by Dr. Roxan Hockey on 07/01/2019.  Discharge Exam: Blood pressure 124/80, pulse (!) 102, temperature 98.8 F (37.1 C), temperature source Oral, resp. rate 18, height 5\' 6"  (1.676 m), weight 60.3 kg, SpO2 94 %.   Cardiovascular: RRR Pulmonary: Stable subcutaneous emphysema bilater neck, arms, and chest  Abdomen: Soft, non tender, bowel  sounds present. Extremities: No lower extremity edema. Wounds: Dressing is clean and dry   Disposition: Discharge disposition: 01-Home or Self Care       Allergies as of 07/06/2019      Reactions   Doxycycline Itching, Other (See Comments)   Itching/ redness      Medication List    TAKE these medications   guaiFENesin 600 MG 12 hr tablet Commonly known as: MUCINEX Take 1 tablet (600 mg total) by mouth 2 (two) times daily as needed for cough or to loosen phlegm.   LORazepam 0.5 MG tablet Commonly known as: ATIVAN Take 1 tablet (0.5 mg total) by mouth 2 (two) times daily as needed for anxiety or sleep.   Stiolto Respimat 2.5-2.5 MCG/ACT Aers Generic drug: Tiotropium Bromide-Olodaterol Inhale 2 puffs into Luis lungs daily. Notes to [name]: 07/07/19 Morning   traMADol 50 MG tablet Commonly known as: ULTRAM Take 1 tablet (50 mg total) by mouth every 6 (six) hours as needed for moderate pain.   valsartan-hydrochlorothiazide 80-12.5 MG tablet Commonly known as: DIOVAN-HCT Take 1 tablet by mouth daily. Notes to [name]: 07/07/19 Morning   vitamin B-12 500 MCG tablet Commonly known as: CYANOCOBALAMIN Take 500 mcg by mouth daily. Notes to [name]: 07/07/19 Morning   Vitamin D 125 MCG (5000 UT) Caps Take 5,000 Units by mouth daily. Notes to [name]: 07/07/19 Morning            Durable Medical Equipment  (From admission, onward)         Start     Ordered   07/06/19 1010  For home use only DME Walker rolling  Once       Question Answer Comment  Walker: With [place] Wheels   [name] needs a walker to treat with Luis following condition Weakness      07/06/19 1010   07/06/19 0946  For home use only DME 4 wheeled rolling walker with seat  Once       Question:  [name] needs a walker to treat with Luis following condition  Answer:  Unsteady gait   07/06/19 0945   07/06/19 0719  For home use only DME oxygen  Once       Question Answer Comment  Length of Need 6 Months    Mode or (Route) Nasal cannula   Liters per Minute 2   Frequency Continuous (stationary and portable oxygen unit needed)   Oxygen delivery system Gas      07/06/19 0719          Follow-up Information    [name], MD. Daphane Shepherd  on 07/20/2019.   Specialty: Cardiothoracic Surgery Why: PA/LAT CXR to be taken (at [facility] which is in Luis same building as Dr. Leonarda Salon office) on 07/07/2019 at 1:15 pm;Appointment time is 1:45 pm Contact information: [address] [place] [address] [place] Follow up.   Why: Luis office will call to schedule physical therapy visits Contact information: [address] [place], [place] [address] [phone]              Signed: Arnoldo Lenis 07/06/2019, 1:37 PM

## 2019-06-18 NOTE — Significant Event (Addendum)
Rapid Response Event Note  Overview: Called to see pt as a second set of eyes. Pt is POD1 from VATS with RUL lobectomy. Pt's CTs were placed to water seal yesterday and, this AM, pt's PCXR showed large PTX. CTs were then placed back to sx. Per RN, pt developed SOB with audible air leak heard around CT site. RN paged MD and orders to place xeroform around CT site.   Initial Focused Assessment: Pt laying in bed with eyes open, in no visible distress. Pt is alert and oriented, c/o of SOB. He says this is better than earlier. L lungs sound diminished t/o. R upper lung sounds with crackles(s/p RUL lobectomy) and R lower lung sounds diminished. Skin warm and dry. R CT sites checked. R 14 Fr CT with xeroform, gauze and tape around site. R 28 Fr CT with gauze and tape around site. Sites unremarkable-no air leak heard from either site. There is a small amount SQ air felt on R lateral chest. Ueh[place] CT drain checked. Drain is connected to 20 cm sx. 4-5 chamber air leak is  present. CTs are draining serosang drainage. T-98.6, HR-88, BP-135/73, RR-17, SpO2-99% on 2L .   Interventions: No RRT inteventions Plan of Care (if not transferred): Spoke with patient and wife about plan of care and concerns. Questions answered and support given. Pt instructed to call RN if SOB gets worse or with any other concerns. Continue to monitor pt. Call RRT if further assistance needed.  Event Summary:  Called: 1919 Arrived: 1935 Ended: 2000  Ramses Klecka Anderson

## 2019-06-18 NOTE — Plan of Care (Signed)

## 2019-06-18 NOTE — Discharge Instructions (Signed)
[facility] office [phone]  (surgeon)  Robot-Assisted Thoracic Surgery, Care After This sheet gives you information about how to care for yourself after your procedure. Your health care provider may also give you more specific instructions. If you have problems or questions, contact your health care provider. What can I expect after the procedure? After the procedure, it is common to have:  Some pain and aches in the area of your surgical cuts (incisions).  Pain when breathing in (inhaling) and coughing.  Tiredness (fatigue).  Trouble sleeping.  Constipation. Follow these instructions at home: Medicines  Take over-the-counter and prescription medicines only as told by your health care provider.  If you were prescribed an antibiotic medicine, take it as told by your health care provider. Do not stop taking the antibiotic even if you start to feel better.  Talk with your health care provider about safe and effective ways to manage pain after your procedure. Pain management should fit your specific health needs.  Take prescription pain medicine before pain becomes severe. Relieving and controlling your pain will make breathing easier for you. Activity  Return to your normal activities as told by your health care provider. Ask your health care provider what activities are safe for you.  Do not lift anything that is heavier than 10 lb (4.5 kg), or the limit that you are told, until your health care provider says that it is safe.  Avoid sitting for a long time without moving. Get up and move around one or more times every few hours. Bathing  Do not take baths, swim, or use a hot tub until your health care provider approves. You may take showers. Incision care  Follow instructions from your health care provider about how to take care of your incision(s). Make sure you: ? Wash your hands with soap and water before you change your bandage (dressing). If soap and water are not available,  use hand sanitizer. ? Change your dressing as told by your health care provider. ? Leave stitches (sutures), skin glue, or adhesive strips in place. These skin closures may need to stay in place for 2 weeks or longer. If adhesive strip edges start to loosen and curl up, you may trim the loose edges. Do not remove adhesive strips completely unless your health care provider tells you to do that.  Check your incision area every day for signs of infection. Check for: ? Redness, swelling, or pain. ? Fluid or blood. ? Warmth. ? Pus or a bad smell. Driving  Ask your health care provider when it is safe for you to drive.  Do not drive or use heavy machinery while taking prescription pain medicine. Eating and drinking  Follow instructions from your health care provider about eating or drinking restrictions. These will vary depending on what procedure you had. Your health care provider may recommend: ? A liquid diet or soft diet for the first few days. ? Meals that are smaller and more frequent. ? A diet of fruits, vegetables, whole grains, and low-fat proteins. ? Limiting foods that are high in processed sugar and fat, including fried and sweet foods. Pneumonia prevention   Do not use any products that contain nicotine or tobacco, such as cigarettes and e-cigarettes. If you need help quitting, ask your health care provider.  Avoid secondhand smoke.  Do deep breathing exercises and cough regularly as directed. This helps to clear mucus and prevent pneumonia. If it hurts to cough, try one of these methods to ease  your pain when you cough: ? Hold a pillow against your chest. ? Place the palms of both hands over your incisions (use splinting).  Use an incentive spirometer as directed. This device measures how much air your lungs are getting with each breath. Using this will improve your breathing.  Do pulmonary rehabilitation as directed. This is a program that includes exercise, education, and  support. General instructions  Wear compression stockings as told by your health care provider. These stockings help to prevent blood clots and reduce swelling in your legs.  If you have a drainage tube: ? Follow instructions from your health care provider about how to take care of it. ? Do not travel by airplane after your tube is removed until your health care provider tells you it is safe.  To prevent or treat constipation while you are taking prescription pain medicine, your health care provider may recommend that you: ? Drink enough fluid to keep your urine pale yellow. ? Take over-the-counter or prescription medicines. ? Eat foods that are high in fiber, such as fresh fruits and vegetables, whole grains, and beans. ? Limit foods that are high in fat and processed sugars, such as fried and sweet foods.  Keep all follow-up visits as told by your health care provider. This is important. Contact a health care provider if:  You have redness, swelling, or pain around an incision.  You have fluid or blood coming from an incision.  An incision feels warm to the touch.  You have pus or a bad smell coming from an incision.  You have a fever.  You cannot eat or drink without vomiting.  Your prescription pain medicine is not controlling your pain. Get help right away if:  You have chest pain.  Your heart is beating quickly.  You have trouble breathing.  You have trouble speaking.  You are confused.  You feel weak or dizzy, or you faint. These symptoms may represent a serious problem that is an emergency. Do not wait to see if the symptoms will go away. Get medical help right away. Call your local emergency services (911 in the [place] Do not drive yourself to the hospital. Summary  Talk with your health care provider about safe and effective ways to manage pain after your procedure. Pain management should fit your specific health needs.  Return to your normal activities as  told by your health care provider. Ask your health care provider what activities are safe for you.  Do deep breathing exercises and cough regularly as directed. This helps to clear mucus and prevent pneumonia. If it hurts to cough, ease pain by holding a pillow against your chest or by placing the palms of both hands over your incisions (splinting). This information is not intended to replace advice given to you by your health care provider. Make sure you discuss any questions you have with your health care provider. Document Revised: 10/30/2018 Document Reviewed: 05/06/2016 Elsevier Patient Education  [place].

## 2019-06-18 NOTE — Progress Notes (Signed)
RN rounded on patient around 1730, stated he was having trouble breathing. RN listened and heard diminished sounds on the left and crackles on the upper right along with the sound of leaking air. Respiratory called to assess and give breathing treatment. Tacy Dura PA also paged at 1757 and instructed RN to wrap chest tube in xeroform, place gauze and make a pressure dressing. RN reassessed at 1830 and patient reported some pain but air leak could no longer be heard. RN will continue to monitor.

## 2019-06-18 NOTE — Progress Notes (Addendum)
[address]       [place] [address]             [phone]      1 Day Post-Op Procedure(s) (LRB): XI ROBOTIC ASSISTED THORASCOPY-RIGHT UPPER LOBECTOMY (Right) INTERCOSTAL NERVE BLOCK (Right) Subjective: Minimal pain, some SOB  Objective: Vital signs in last 24 hours: Temp:  [96.5 F (35.8 C)-98.3 F (36.8 C)] 97.9 F (36.6 C) (06/04 0510) Pulse Rate:  [69-98] 76 (06/04 0510) Cardiac Rhythm: Normal sinus rhythm (06/03 1900) Resp:  [10-20] 13 (06/04 0510) BP: (106-149)/(63-110) 109/66 (06/04 0510) SpO2:  [95 %-100 %] 97 % (06/04 0510)  Hemodynamic parameters for last 24 hours:    Intake/Output from previous day: 06/03 0701 - 06/04 0700 In: 3645.3 [P.O.:240; I.V.:1992.8; IV Piggyback:732.5] Out: 290 [Urine:200; Blood:50; Chest Tube:40] Intake/Output this shift: No intake/output data recorded.  General appearance: alert, cooperative and no distress Heart: regular rate and rhythm Lungs: mildly dim on right Abdomen: benign Extremities: PAS in place Incis - minor serosang drainage    Lab Results: Recent Labs    06/15/19 1341 06/15/19 1341 06/17/19 1225 06/18/19 0500  WBC 9.7  --   --  11.3*  HGB 16.6   < > 14.3 12.6*  HCT 47.8   < > 42.0 36.3*  PLT 234  --   --  185   < > = values in this interval not displayed.   BMET:  Recent Labs    06/15/19 1341 06/15/19 1341 06/17/19 1225 06/18/19 0500  NA 135   < > 139 134*  K 4.3   < > 4.5 4.1  CL 99  --   --  102  CO2 24  --   --  26  GLUCOSE 93  --   --  97  BUN 15  --   --  17  CREATININE 1.17  --   --  1.17  CALCIUM 9.3  --   --  7.7*   < > = values in this interval not displayed.    PT/INR:  Recent Labs    06/15/19 1341  LABPROT 12.4  INR 1.0   ABG    Component Value Date/Time   PHART 7.387 06/18/2019 0500   HCO3 25.1 06/18/2019 0500   TCO2 28 06/17/2019 1225   ACIDBASEDEF 3.0 (H) 06/17/2019 1225   O2SAT 97.6 06/18/2019 0500   CBG (last 3)  No results for  input(s): GLUCAP in the last 72 hours.  Meds Scheduled Meds: . acetaminophen  1,000 mg Oral Q6H   Or  . acetaminophen (TYLENOL) oral liquid 160 mg/5 mL  1,000 mg Oral Q6H  . bisacodyl  10 mg Oral Daily  . enoxaparin (LOVENOX) injection  40 mg Subcutaneous Daily  . irbesartan  75 mg Oral Daily   And  . hydrochlorothiazide  12.5 mg Oral Daily  . ketorolac  15 mg Intravenous Q6H  . pantoprazole  40 mg Oral Daily  . senna-docusate  1 tablet Oral QHS   Continuous Infusions: . sodium chloride    . sodium chloride 100 mL/hr at 06/17/19 1515   PRN Meds:.Place/Maintain arterial line **AND** sodium chloride, levalbuterol, ondansetron (ZOFRAN) IV, oxyCODONE, traMADol  Xrays DG Chest Port 1 View  Result Date: 06/17/2019 CLINICAL DATA:  Right upper lobectomy EXAM: PORTABLE CHEST 1 VIEW COMPARISON:  06/15/2019 FINDINGS: Interval postsurgical changes from right upper lobectomy with volume loss within the right hemithorax. Right apical chest tube and pigtail drainage catheters are in  place. A right IJ central venous catheter terminates at the level of the mid SVC. Heart size is normal. Atelectatic change in the right mid lung. No discernible pneumothorax. Left lung is clear. IMPRESSION: Postsurgical changes from right upper lobectomy. No discernible pneumothorax. Electronically Signed   By: Davina Poke D.O.   On: 06/17/2019 14:59    Assessment/Plan: S/P Procedure(s) (LRB): XI ROBOTIC ASSISTED THORASCOPY-RIGHT UPPER LOBECTOMY (Right) INTERCOSTAL NERVE BLOCK (Right)  1 VSS, afebrile 2 sats ok on 1 liter 3 CT- + air leak- will place to 20 suction as large pntx on CXR 4 decrease IVF rate 5 expected ABL anemia- monitor 6 normal renal fxn   LOS: 1 day    John Giovanni PA-C Pager  06/18/2019 Complains of shortness of breath - "can't get a deep breath" and pain after CT placed to suction. O2 sat 99-100%. Will give IV morphine Change Xopenex to a standing order Ambulate later today SCD +  enoxaparin for DVT prophylaxis  Remo Lipps C. Roxan Hockey, MD [facility] [phone]

## 2019-06-19 ENCOUNTER — Inpatient Hospital Stay (HOSPITAL_COMMUNITY): Payer: Medicare Other

## 2019-06-19 LAB — CBC
HCT: 36.7 % — ABNORMAL LOW (ref 39.0–52.0)
Hemoglobin: 12.4 g/dL — ABNORMAL LOW (ref 13.0–17.0)
MCH: 30.5 pg (ref 26.0–34.0)
MCHC: 33.8 g/dL (ref 30.0–36.0)
MCV: 90.2 fL (ref 80.0–100.0)
Platelets: 153 10*3/uL (ref 150–400)
RBC: 4.07 MIL/uL — ABNORMAL LOW (ref 4.22–5.81)
RDW: 12.7 % (ref 11.5–15.5)
WBC: 10.7 10*3/uL — ABNORMAL HIGH (ref 4.0–10.5)
nRBC: 0 % (ref 0.0–0.2)

## 2019-06-19 LAB — COMPREHENSIVE METABOLIC PANEL
ALT: 10 U/L (ref 0–44)
AST: 16 U/L (ref 15–41)
Albumin: 2.9 g/dL — ABNORMAL LOW (ref 3.5–5.0)
Alkaline Phosphatase: 29 U/L — ABNORMAL LOW (ref 38–126)
Anion gap: 7 (ref 5–15)
BUN: 13 mg/dL (ref 8–23)
CO2: 29 mmol/L (ref 22–32)
Calcium: 7.9 mg/dL — ABNORMAL LOW (ref 8.9–10.3)
Chloride: 101 mmol/L (ref 98–111)
Creatinine, Ser: 1.09 mg/dL (ref 0.61–1.24)
GFR calc Af Amer: >60 mL/min (ref >60)
GFR calc non Af Amer: >60 mL/min (ref >60)
Glucose, Bld: 99 mg/dL (ref 70–99)
Potassium: 4.5 mmol/L (ref 3.5–5.1)
Sodium: 137 mmol/L (ref 135–145)
Total Bilirubin: 0.7 mg/dL (ref 0.3–1.2)
Total Protein: 4.9 g/dL — ABNORMAL LOW (ref 6.5–8.1)

## 2019-06-19 NOTE — Progress Notes (Addendum)
      [address]       [place] [address]             [phone]       2 Days Post-Op Procedure(s) (LRB): XI ROBOTIC ASSISTED THORASCOPY-RIGHT UPPER LOBECTOMY (Right) INTERCOSTAL NERVE BLOCK (Right)  Subjective: Events of early last evening noted (audible leaking around chest tube) and Xeroform gauze placed. Patient states his breathing is not real good (despite good oxygenation).  Objective: Vital signs in last 24 hours: Temp:  [98.2 F (36.8 C)-98.6 F (37 C)] 98.3 F (36.8 C) (06/05 0706) Pulse Rate:  [73-99] 73 (06/05 0706) Cardiac Rhythm: Normal sinus rhythm (06/05 0730) Resp:  [12-20] 14 (06/05 0706) BP: (110-135)/(64-80) 114/72 (06/05 0706) SpO2:  [94 %-100 %] 94 % (06/05 0746) FiO2 (%):  [28 %] 28 % (06/05 0746)     Intake/Output from previous day: 06/04 0701 - 06/05 0700 In: 49 [P.O.:580] Out: 980 [Urine:500; Chest Tube:480]   Physical Exam:  Cardiovascular: RRR Pulmonary: Clear on the left and coarse on the right;some subcutaneous emphysema right lateral chest wall Abdomen: Soft, non tender, bowel sounds present. Extremities: No lower extremity edema. Wounds: Dressing is clean and dry Chest Tube: to suction, ++ air leak  Lab Results: CBC: Recent Labs    06/18/19 0500 06/19/19 0358  WBC 11.3* 10.7*  HGB 12.6* 12.4*  HCT 36.3* 36.7*  PLT 185 153   BMET:  Recent Labs    06/18/19 0500 06/19/19 0358  NA 134* 137  K 4.1 4.5  CL 102 101  CO2 26 29  GLUCOSE 97 99  BUN 17 13  CREATININE 1.17 1.09  CALCIUM 7.7* 7.9*    PT/INR: No results for input(s): LABPROT, INR in the last 72 hours. ABG:  INR: Will add last result for INR, ABG once components are confirmed Will add last 4 CBG results once components are confirmed  Assessment/Plan:  1. CV - SR 2.  Pulmonary - History of COPD. On 2 liters of oxygen via [place]. Chest tube with 480 last 24 hours;110 cc since 7 am. Chest tube is to suction and there is a ++ leak. CXR this  am shows improvement in aeration, ? Trace apical pneumothorax, and right chest wall emphysema. Encourage incentive spirometer. Xopenex PRN. Patient has Tiotropium Bromide-Olodaterol inhaler so will ask nurse to give daily am starting 06/06. Await final pathology. 3. Expected ABL anemia-H and H this am stable at 12.4 and 36.7 4. Remove central line;no need for labs to be drawn  Sharalyn Ink ZimmermanPA-C 06/19/2019,10:16 AM [phone]  Patient examined and today's chest x-ray image personally reviewed We will leave both chest tubes in place because of moderate air leak and persistent significant fluid drainage from large tube. Pain fairly well controlled Toradol  patient examined and medical record reviewed,agree with above note. Tharon Aquas Trigt III 06/19/2019

## 2019-06-19 NOTE — Plan of Care (Signed)

## 2019-06-20 ENCOUNTER — Inpatient Hospital Stay (HOSPITAL_COMMUNITY): Payer: Medicare Other

## 2019-06-20 MED ORDER — METOCLOPRAMIDE HCL 5 MG/ML IJ SOLN
10.0000 mg | Freq: Four times a day (QID) | INTRAMUSCULAR | Status: AC
  Administered 2019-06-20 – 2019-06-21 (×5): 10 mg via INTRAVENOUS
  Filled 2019-06-20 (×5): qty 2

## 2019-06-20 MED ORDER — GUAIFENESIN ER 600 MG PO TB12
600.0000 mg | ORAL_TABLET | Freq: Two times a day (BID) | ORAL | Status: DC
  Administered 2019-06-20 – 2019-07-06 (×32): 600 mg via ORAL
  Filled 2019-06-20 (×32): qty 1

## 2019-06-20 MED ORDER — LACTULOSE 10 GM/15ML PO SOLN
20.0000 g | Freq: Once | ORAL | Status: AC
  Administered 2019-06-20: 20 g via ORAL
  Filled 2019-06-20: qty 30

## 2019-06-20 NOTE — Plan of Care (Signed)

## 2019-06-20 NOTE — Progress Notes (Addendum)
      [address]       [place] [address]             [phone]       3 Days Post-Op Procedure(s) (LRB): XI ROBOTIC ASSISTED THORASCOPY-RIGHT UPPER LOBECTOMY (Right) INTERCOSTAL NERVE BLOCK (Right)  Subjective: Patient feels like lungs are "full", wants laxative.  Objective: Vital signs in last 24 hours: Temp:  [98 F (36.7 C)-98.4 F (36.9 C)] 98.3 F (36.8 C) (06/06 0709) Pulse Rate:  [78-87] 78 (06/06 0751) Cardiac Rhythm: Normal sinus rhythm (06/06 0805) Resp:  [14-20] 16 (06/06 0751) BP: (121-147)/(70-90) 145/90 (06/06 0751) SpO2:  [94 %-100 %] 100 % (06/06 0751) FiO2 (%):  [28 %] 28 % (06/05 1448)     Intake/Output from previous day: 06/05 0701 - 06/06 0700 In: 1160 [P.O.:1160] Out: 880 [Urine:600; Chest Tube:280]   Physical Exam:  Cardiovascular: RRR Pulmonary: Clear on the left and coarse on the right;some subcutaneous emphysema right lateral chest wall Abdomen: Soft, non tender, bowel sounds present. Extremities: No lower extremity edema. Wounds: Dressing is clean and dry (not leaking around chest tube) Chest Tube: to suction, ++ air leak  Lab Results: CBC: Recent Labs    06/18/19 0500 06/19/19 0358  WBC 11.3* 10.7*  HGB 12.6* 12.4*  HCT 36.3* 36.7*  PLT 185 153   BMET:  Recent Labs    06/18/19 0500 06/19/19 0358  NA 134* 137  K 4.1 4.5  CL 102 101  CO2 26 29  GLUCOSE 97 99  BUN 17 13  CREATININE 1.17 1.09  CALCIUM 7.7* 7.9*    PT/INR: No results for input(s): LABPROT, INR in the last 72 hours. ABG:  INR: Will add last result for INR, ABG once components are confirmed Will add last 4 CBG results once components are confirmed  Assessment/Plan:  1. CV - SR 2.  Pulmonary - History of COPD. On 4 liters of oxygen via [place]. Chest tube with 280 last 24 hoursChest tube is to suction and there is a ++ leak. CXR this am appears stable (? Trace right apical pneumothorax, stable right lateral subcutaneous emphysema).  Chest tubes to remain for now.Encourage incentive spirometer. Xopenex PRN, Mucinex., Tiotropium every am Bromide-Olodaterol inhaler (take prior to surgery) so will ask nurse to give daily am starting 06/06. Await final pathology. 3. Expected ABL anemia-H and H yesterday stable at 12.4 and 36.7 4. Regaln, laxative   Donielle M ZimmermanPA-C 06/20/2019,8:51 AM [phone]  Right upper lobe resection with history of COPD chest tube remains because of moderate air leak and significant serosanguineous drainage patient starting to ambulate more  patient examined and medical record reviewed,agree with above note. Tharon Aquas Trigt III 06/20/2019

## 2019-06-21 ENCOUNTER — Inpatient Hospital Stay (HOSPITAL_COMMUNITY): Payer: Medicare Other

## 2019-06-21 LAB — TYPE AND SCREEN
ABO/RH(D): A NEG
Antibody Screen: NEGATIVE
Unit division: 0
Unit division: 0

## 2019-06-21 LAB — BPAM RBC
Blood Product Expiration Date: 202106142359
Blood Product Expiration Date: 202106152359
Unit Type and Rh: 600
Unit Type and Rh: 600

## 2019-06-21 NOTE — Progress Notes (Signed)
Blake drain discontinued without difficulty.  Patient tolerated well.  Pigtail to 20 of suction

## 2019-06-21 NOTE — Progress Notes (Signed)
Luis [name] changed without difficulty

## 2019-06-21 NOTE — Progress Notes (Addendum)
      [address]       [place] [address]             [phone]       4 Days Post-Op Procedure(s) (LRB): XI ROBOTIC ASSISTED THORASCOPY-RIGHT UPPER LOBECTOMY (Right) INTERCOSTAL NERVE BLOCK (Right)  Subjective: Patient feeling better this am.  Objective: Vital signs in last 24 hours: Temp:  [97.8 F (36.6 C)-98.4 F (36.9 C)] 98.3 F (36.8 C) (06/07 0703) Pulse Rate:  [78-89] 89 (06/07 0703) Cardiac Rhythm: Normal sinus rhythm (06/07 0529) Resp:  [15-20] 17 (06/07 0703) BP: (135-158)/(78-90) 155/85 (06/07 0703) SpO2:  [92 %-100 %] 99 % (06/07 0703)     Intake/Output from previous day: 06/06 0701 - 06/07 0700 In: 1040 [P.O.:1040] Out: 1190 [Urine:900; Chest Tube:290]   Physical Exam:  Cardiovascular: RRR Pulmonary: Clear on the left and coarse on the right;some subcutaneous emphysema right lateral chest wall Abdomen: Soft, non tender, bowel sounds present. Extremities: No lower extremity edema. Wounds: Dressing is clean and dry  Chest Tube: to suction, + air leak that worsens with cough  Lab Results: CBC: Recent Labs    06/19/19 0358  WBC 10.7*  HGB 12.4*  HCT 36.7*  PLT 153   BMET:  Recent Labs    06/19/19 0358  NA 137  K 4.5  CL 101  CO2 29  GLUCOSE 99  BUN 13  CREATININE 1.09  CALCIUM 7.9*    PT/INR: No results for input(s): LABPROT, INR in the last 72 hours. ABG:  INR: Will add last result for INR, ABG once components are confirmed Will add last 4 CBG results once components are confirmed  Assessment/Plan:  1. CV - SR 2.  Pulmonary - History of COPD. On 4 liters of oxygen via [place]. Chest tube with 290 last 24 hours.Chest tube is to suction and there is a + leak that worsens with cough. CXR this am appears stable (stable right lateral subcutaneous emphysema). Chest tubes to remain for now. Encourage incentive spirometer. Xopenex PRN, Mucinex., Tiotropium every am Bromide-Olodaterol inhaler (take prior to surgery) so  will ask nurse to give daily am starting 06/06. Await final pathology. 3. Expected ABL anemia-H and H yesterday stable at 12.4 and 36.7   Donielle M ZimmermanPA-C 06/21/2019,7:18 AM [phone]  Patient seen and examined, agree with above Will dc Blake drain and keep pigtail to suction today  Remo Lipps C. Roxan Hockey, MD [facility] [phone]

## 2019-06-22 ENCOUNTER — Encounter: Payer: Self-pay | Admitting: Thoracic Surgery (Cardiothoracic Vascular Surgery)

## 2019-06-22 ENCOUNTER — Inpatient Hospital Stay (HOSPITAL_COMMUNITY): Payer: Medicare Other

## 2019-06-22 LAB — SURGICAL PATHOLOGY

## 2019-06-22 NOTE — Progress Notes (Addendum)
[address]       [place] [address]             [phone]      5 Days Post-Op Procedure(s) (LRB): XI ROBOTIC ASSISTED THORASCOPY-RIGHT UPPER LOBECTOMY (Right) INTERCOSTAL NERVE BLOCK (Right) Subjective: C/o nausea  Objective: Vital signs in last 24 hours: Temp:  [97.7 F (36.5 C)-98.9 F (37.2 C)] 97.9 F (36.6 C) (06/08 0722) Pulse Rate:  [89-99] 99 (06/08 0722) Cardiac Rhythm: Normal sinus rhythm (06/07 1900) Resp:  [14-20] 14 (06/08 0722) BP: (147-177)/(88-93) 163/88 (06/08 0722) SpO2:  [97 %-99 %] 98 % (06/08 0722)  Hemodynamic parameters for last 24 hours:    Intake/Output from previous day: 06/07 0701 - 06/08 0700 In: 480 [P.O.:480] Out: 1125 [Urine:850; Chest Tube:275] Intake/Output this shift: No intake/output data recorded.  General appearance: alert, cooperative and no distress Heart: regular rate and rhythm and tachy Lungs: clear to auscultation bilaterally Abdomen: benign Extremities: no edema or calf tenderness Wound: incis without signs of infection  Lab Results: No results for input(s): WBC, HGB, HCT, PLT in the last 72 hours. BMET: No results for input(s): NA, K, CL, CO2, GLUCOSE, BUN, CREATININE, CALCIUM in the last 72 hours.  PT/INR: No results for input(s): LABPROT, INR in the last 72 hours. ABG    Component Value Date/Time   PHART 7.387 06/18/2019 0500   HCO3 25.1 06/18/2019 0500   TCO2 28 06/17/2019 1225   ACIDBASEDEF 3.0 (H) 06/17/2019 1225   O2SAT 97.6 06/18/2019 0500   CBG (last 3)  No results for input(s): GLUCAP in the last 72 hours.  Meds Scheduled Meds: . acetaminophen  1,000 mg Oral Q6H   Or  . acetaminophen (TYLENOL) oral liquid 160 mg/5 mL  1,000 mg Oral Q6H  . bisacodyl  10 mg Oral Daily  . enoxaparin (LOVENOX) injection  40 mg Subcutaneous Daily  . guaiFENesin  600 mg Oral BID  . irbesartan  75 mg Oral Daily   And  . hydrochlorothiazide  12.5 mg Oral Daily  . pantoprazole  40 mg Oral  Daily  . senna-docusate  1 tablet Oral QHS  . sodium chloride flush  10-40 mL Intracatheter Q12H   Continuous Infusions: . sodium chloride 10 mL/hr at 06/18/19 0734   PRN Meds:.levalbuterol, morphine injection, ondansetron (ZOFRAN) IV, oxyCODONE, sodium chloride flush, traMADol  Xrays DG CHEST PORT 1 VIEW  Result Date: 06/21/2019 CLINICAL DATA:  Follow-up after right lung surgery. EXAM: PORTABLE CHEST 1 VIEW COMPARISON:  06/20/2019 FINDINGS: Postoperative changes on the right are stable. Two right-sided chest tubes also unchanged. There is no pneumothorax. Mild linear opacities at left lung base are noted consistent with atelectasis similar to the previous day's study. No new lung abnormalities. Right lateral chest wall subcutaneous emphysema is again noted. IMPRESSION: 1. No change from the most recent prior exam. 2. Stable positioning of right-sided chest tubes.  No pneumothorax Electronically Signed   By: Lajean Manes M.D.   On: 06/21/2019 08:11    Assessment/Plan: S/P Procedure(s) (LRB): XI ROBOTIC ASSISTED THORASCOPY-RIGHT UPPER LOBECTOMY (Right) INTERCOSTAL NERVE BLOCK (Right)   1 some HTN and tachycardia, may be a little dry, feels"stressed". Encouraged him to drink more water as is only drinking [employer] 2 sats ok on 2 liters- cont pulm Tx/Rx 3 + air leak, CXR shows sytable post-op changes without PNTX- cont CT to suction , hopefully wont need further procedures(eg valves) 4 no new labs. Will recheck in am   LOS:  5 days    Luis [name] 06/22/2019 Patient seen and examined, agree with above Will decrease suction to 10 cm Unclear what is causing nausea at this point, has had BM, no new medication added  Remo Lipps C. Roxan Hockey, MD [facility] [phone]

## 2019-06-22 NOTE — Plan of Care (Signed)

## 2019-06-22 NOTE — Progress Notes (Signed)
Patient ambulated 162ft in the hallway, O2 remained 93-95% on room air, patient stated that he needed to stop to "catch my breath" stated that he felt "short of breath". Patient now in room on 2 L of O2 with chest tube on suction.

## 2019-06-22 NOTE — Plan of Care (Signed)

## 2019-06-23 ENCOUNTER — Inpatient Hospital Stay (HOSPITAL_COMMUNITY): Payer: Medicare Other

## 2019-06-23 LAB — BASIC METABOLIC PANEL
Anion gap: 9 (ref 5–15)
BUN: 9 mg/dL (ref 8–23)
CO2: 30 mmol/L (ref 22–32)
Calcium: 8.7 mg/dL — ABNORMAL LOW (ref 8.9–10.3)
Chloride: 97 mmol/L — ABNORMAL LOW (ref 98–111)
Creatinine, Ser: 0.95 mg/dL (ref 0.61–1.24)
GFR calc Af Amer: >60 mL/min (ref >60)
GFR calc non Af Amer: >60 mL/min (ref >60)
Glucose, Bld: 107 mg/dL — ABNORMAL HIGH (ref 70–99)
Potassium: 3.9 mmol/L (ref 3.5–5.1)
Sodium: 136 mmol/L (ref 135–145)

## 2019-06-23 LAB — CBC
HCT: 37.9 % — ABNORMAL LOW (ref 39.0–52.0)
Hemoglobin: 13.1 g/dL (ref 13.0–17.0)
MCH: 30.5 pg (ref 26.0–34.0)
MCHC: 34.6 g/dL (ref 30.0–36.0)
MCV: 88.3 fL (ref 80.0–100.0)
Platelets: 238 10*3/uL (ref 150–400)
RBC: 4.29 MIL/uL (ref 4.22–5.81)
RDW: 12.3 % (ref 11.5–15.5)
WBC: 8.5 10*3/uL (ref 4.0–10.5)
nRBC: 0 % (ref 0.0–0.2)

## 2019-06-23 MED ORDER — POTASSIUM CHLORIDE CRYS ER 20 MEQ PO TBCR
30.0000 meq | EXTENDED_RELEASE_TABLET | Freq: Once | ORAL | Status: AC
  Administered 2019-06-23: 30 meq via ORAL
  Filled 2019-06-23: qty 1

## 2019-06-23 NOTE — Plan of Care (Signed)

## 2019-06-23 NOTE — Progress Notes (Signed)
Patient c/o swelling on his scrotum and perineal area. It wasn't that before. Notified PA Tessa regarding this matter at 1400. Continue to monitor it and elevated scrotum with towel. Rechecked swelling of scrotum which was bigger than earlier. Notified Dr. Roxan Hockey regarding this matter. Dr. Roxan Hockey checked patient. Continue to monitor with elevating scrotum. HS [employer]

## 2019-06-23 NOTE — Progress Notes (Signed)
      [address]       [name],Luis [name]             [phone]       CTSP for scrotal swelling  Mr. Aloia noted scrotal swelling this afternoon. No pain or tenderness, just the sensation of swelling  On exam scrtoum is swollen but not tender or erythematous.  Elevate, ice, follow  Remo Lipps C. Roxan Hockey, MD [facility] [phone]

## 2019-06-23 NOTE — Progress Notes (Addendum)
      [address]       [place] [address]             [phone]       6 Days Post-Op Procedure(s) (LRB): XI ROBOTIC ASSISTED THORASCOPY-RIGHT UPPER LOBECTOMY (Right) INTERCOSTAL NERVE BLOCK (Right)  Subjective: Patient denies nausea this am.  Objective: Vital signs in last 24 hours: Temp:  [97.6 F (36.4 C)-98.8 F (37.1 C)] 97.6 F (36.4 C) (06/09 0404) Pulse Rate:  [95-114] 114 (06/09 0640) Cardiac Rhythm: Sinus tachycardia (06/08 1900) Resp:  [14-20] 19 (06/09 0640) BP: (121-163)/(78-89) 147/89 (06/09 0404) SpO2:  [92 %-99 %] 96 % (06/09 0640)     Intake/Output from previous day: 06/08 0701 - 06/09 0700 In: 480 [P.O.:480] Out: 1885 [Urine:1815; Chest Tube:70]   Physical Exam:  Cardiovascular: RRR Pulmonary: Mostly clear (rub on right with pigtail in place) Abdomen: Soft, non tender, bowel sounds present. Extremities: No lower extremity edema. Wounds: Dressing is clean and dry  Chest Tube: to suction, ++ air leak  Lab Results: CBC: Recent Labs    06/23/19 0211  WBC 8.5  HGB 13.1  HCT 37.9*  PLT 238   BMET:  Recent Labs    06/23/19 0211  NA 136  K 3.9  CL 97*  CO2 30  GLUCOSE 107*  BUN 9  CREATININE 0.95  CALCIUM 8.7*    PT/INR: No results for input(s): LABPROT, INR in the last 72 hours. ABG:  INR: Will add last result for INR, ABG once components are confirmed Will add last 4 CBG results once components are confirmed  Assessment/Plan:  1. CV - ST. On Irbesartan 75 mg daily 2.  Pulmonary - History of COPD. On 2 liters of oxygen via [place]. Chest tube with 70 last 24 hoursChest tube is to suction and there is a ++ leak. CXR this am appears stable (small, right apical pneumothorax, stable right lateral subcutaneous emphysema). Hope to place pigtail to water seal soon. Encourage incentive spirometer. Xopenex PRN, Mucinex., Tiotropium every am Bromide-Olodaterol inhaler (take prior to surgery) so will ask nurse to give daily  am starting 06/06.  3. Expected ABL anemia-H and H this am 13.1 and 37.9 4. 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 Roxan Hockey, MD [facility] [phone]

## 2019-06-23 NOTE — Plan of Care (Signed)

## 2019-06-24 ENCOUNTER — Inpatient Hospital Stay (HOSPITAL_COMMUNITY): Payer: Medicare Other

## 2019-06-24 NOTE — H&P (View-Only) (Signed)
      [address]       [place] [address]             [phone]       7 Days Post-Op Procedure(s) (LRB): XI ROBOTIC ASSISTED THORASCOPY-RIGHT UPPER LOBECTOMY (Right) INTERCOSTAL NERVE BLOCK (Right)  Subjective: Patient with complaints of increased "swelling" anterior/lateral chest And scrotum  Objective: Vital signs in last 24 hours: Temp:  [98.3 F (36.8 C)-99.5 F (37.5 C)] 98.3 F (36.8 C) (06/10 0357) Pulse Rate:  [73-115] 73 (06/09 2000) Cardiac Rhythm: Sinus tachycardia (06/09 1900) Resp:  [13-20] 20 (06/10 0617) BP: (136-140)/(86-92) 136/90 (06/09 1919) SpO2:  [90 %-95 %] 95 % (06/10 0357)     Intake/Output from previous day: 06/09 0701 - 06/10 0700 In: 250 [P.O.:240; I.V.:10] Out: 140 [Urine:100; Chest Tube:40]   Physical Exam:  Cardiovascular: RRR Pulmonary: Mostly clear on the left and diminished on right. Subcutaneous emphysema chest wall Abdomen: Soft, non tender, bowel sounds present. Extremities: No lower extremity edema. Wounds: Dressing is clean and dry  Chest Tube: to water seal, ++ + air leak with cough  Lab Results: CBC: Recent Labs    06/23/19 0211  WBC 8.5  HGB 13.1  HCT 37.9*  PLT 238   BMET:  Recent Labs    06/23/19 0211  NA 136  K 3.9  CL 97*  CO2 30  GLUCOSE 107*  BUN 9  CREATININE 0.95  CALCIUM 8.7*    PT/INR: No results for input(s): LABPROT, INR in the last 72 hours. ABG:  INR: Will add last result for INR, ABG once components are confirmed Will add last 4 CBG results once components are confirmed  Assessment/Plan:  1. CV - ST. On Irbesartan 75 mg daily 2.  Pulmonary - History of COPD. On 2 liters of oxygen via [place]. Chest tube with 40 last 24 hours. Chest tube is to water seal and there is a +++ leak with cough. CXR this am appears to show right apical/basilar pneumothorax, stable right lateral subcutaneous emphysema). I put patient on 15 cm suction and has good oxygenation. Encourage  incentive spirometer. Xopenex PRN, Mucinex., Tiotropium every am Bromide-Olodaterol inhaler  to give daily am starting 06/06.    Donielle M ZimmermanPA-C 06/24/2019,7:19 AM [phone]  Patient seen and examined, agree with above Had to go back on suction due to increased SQ emphysema, pneumothorax. At this point I think the best option would be to place intrabronchial valves to try to stop the air leak or at least decrease it enough that he can go home with a tube on waterseal.  I described the proposed procedure to Mr. and Mrs. Spofford.  They understand this would be done in the operating room under general anesthesia.  They understand that the valves would need to be removed in 6 to 8 weeks.  I informed him of the indications, risk, benefits, and alternatives.  They understand the risks include, but not limited to failure to improve air leak, MI, DVT, PE, bleeding, as well as other unforeseeable complications.  He accepts the risk and wished to proceed.  We will plan for bronchoscopy for IBV placement tomorrow morning  Remo Lipps C. Roxan Hockey, MD [facility] [phone]

## 2019-06-24 NOTE — Plan of Care (Signed)

## 2019-06-24 NOTE — Progress Notes (Addendum)
      Luis [name]       [place] [address]             [phone]       7 Days Post-Op Procedure(s) (LRB): XI ROBOTIC ASSISTED THORASCOPY-RIGHT UPPER LOBECTOMY (Right) INTERCOSTAL NERVE BLOCK (Right)  Subjective: Patient with complaints of increased "swelling" anterior/lateral chest And scrotum  Objective: Vital signs in last 24 hours: Temp:  [98.3 F (36.8 C)-99.5 F (37.5 C)] 98.3 F (36.8 C) (06/10 0357) Pulse Rate:  [73-115] 73 (06/09 2000) Cardiac Rhythm: Sinus tachycardia (06/09 1900) Resp:  [13-20] 20 (06/10 0617) BP: (136-140)/(86-92) 136/90 (06/09 1919) SpO2:  [90 %-95 %] 95 % (06/10 0357)     Intake/Output from previous day: 06/09 0701 - 06/10 0700 In: 250 [P.O.:240; I.V.:10] Out: 140 [Urine:100; Chest Tube:40]   Physical Exam:  Cardiovascular: RRR Pulmonary: Mostly clear on the left and diminished on right. Subcutaneous emphysema chest wall Abdomen: Soft, non tender, bowel sounds present. Extremities: No lower extremity edema. Wounds: Dressing is clean and dry  Chest Tube: to water seal, ++ + air leak with cough  Lab Results: CBC: Recent Labs    06/23/19 0211  WBC 8.5  HGB 13.1  HCT 37.9*  PLT 238   BMET:  Recent Labs    06/23/19 0211  NA 136  K 3.9  CL 97*  CO2 30  GLUCOSE 107*  BUN 9  CREATININE 0.95  CALCIUM 8.7*    PT/INR: No results for input(s): LABPROT, INR in the last 72 hours. ABG:  INR: Will add last result for INR, ABG once components are confirmed Will add last 4 CBG results once components are confirmed  Assessment/Plan:  1. CV - ST. On Irbesartan 75 mg daily 2.  Pulmonary - History of COPD. On 2 liters of oxygen via [place]. Chest tube with 40 last 24 hours. Chest tube is to water seal and there is a +++ leak with cough. CXR this am appears to show right apical/basilar pneumothorax, stable right lateral subcutaneous emphysema). I put patient on 15 cm suction and has good oxygenation. Encourage  incentive spirometer. Xopenex PRN, Mucinex., Tiotropium every am Bromide-Olodaterol inhaler  to give daily am starting 06/06.    Donielle M ZimmermanPA-C 06/24/2019,7:19 AM [phone]  Patient seen and examined, agree with above Had to go back on suction due to increased SQ emphysema, pneumothorax. At this point I think the best option would be to place intrabronchial valves to try to stop the air leak or at least decrease it enough that he can go home with a tube on waterseal.  I described the proposed procedure to Mr. and Mrs. Moylan.  They understand this would be done in the operating room under general anesthesia.  They understand that the valves would need to be removed in 6 to 8 weeks.  I informed him of the indications, risk, benefits, and alternatives.  They understand the risks include, but not limited to failure to improve air leak, MI, DVT, PE, bleeding, as well as other unforeseeable complications.  He accepts the risk and wished to proceed.  We will plan for bronchoscopy for IBV placement tomorrow morning  Remo Lipps C. Roxan Hockey, MD [facility] [phone]

## 2019-06-24 NOTE — Anesthesia Preprocedure Evaluation (Addendum)
Anesthesia Evaluation    Reviewed: Allergy & Precautions, Patient's Chart, lab work & pertinent test results  History of Anesthesia Complications Negative for: history of anesthetic complications  Airway Mallampati: II  TM Distance: >3 FB Neck ROM: Full    Dental  (+) Dental Advisory Given, Chipped   Pulmonary COPD,  COPD inhaler, Current Smoker and Patient abstained from smoking.,   S/p RUL lobectomy    Pulmonary exam normal        Cardiovascular hypertension, Pt. on medications + Peripheral Vascular Disease  Normal cardiovascular exam     Neuro/Psych  Headaches, negative psych ROS   GI/Hepatic Neg liver ROS, GERD  Controlled,  Endo/Other  negative endocrine ROS  Renal/GU negative Renal ROS     Musculoskeletal  (+) Arthritis ,   Abdominal   Peds  Hematology negative hematology ROS (+)   Anesthesia Other Findings Covid neg 6/1  Reproductive/Obstetrics                            Anesthesia Physical Anesthesia Plan  ASA: III  Anesthesia Plan: General   Post-op Pain Management:    Induction: Intravenous  PONV Risk Score and Plan: 2 and Treatment may vary due to age or medical condition, Ondansetron and Dexamethasone  Airway Management Planned: Oral ETT  Additional Equipment: None  Intra-op Plan:   Post-operative Plan: Extubation in OR  Informed Consent: I have reviewed the patients History and Physical, chart, labs and discussed the procedure including the risks, benefits and alternatives for the proposed anesthesia with the patient or authorized representative who has indicated his/her understanding and acceptance.     Dental advisory given  Plan Discussed with: CRNA and Anesthesiologist  Anesthesia Plan Comments:        Anesthesia Quick Evaluation

## 2019-06-25 ENCOUNTER — Encounter (HOSPITAL_COMMUNITY)
Admission: RE | Disposition: A | Discharge status: Home Health | Payer: Self-pay | Source: Home / Self Care | Attending: Thoracic Surgery (Cardiothoracic Vascular Surgery)

## 2019-06-25 ENCOUNTER — Inpatient Hospital Stay (HOSPITAL_COMMUNITY): Payer: Medicare Other

## 2019-06-25 ENCOUNTER — Inpatient Hospital Stay (HOSPITAL_COMMUNITY): Payer: Medicare Other | Admitting: Certified Registered Nurse Anesthetist

## 2019-06-25 DIAGNOSIS — J9382 Other air leak: Secondary | ICD-10-CM

## 2019-06-25 HISTORY — PX: VIDEO BRONCHOSCOPY WITH INSERTION OF INTERBRONCHIAL VALVE (IBV): SHX6178

## 2019-06-25 SURGERY — BRONCHOSCOPY, FLEXIBLE, WITH INTRABRONCHIAL VALVE INSERTION
Anesthesia: General | Site: Chest | Laterality: Right

## 2019-06-25 MED ORDER — ROCURONIUM BROMIDE 10 MG/ML (PF) SYRINGE
PREFILLED_SYRINGE | INTRAVENOUS | Status: DC | PRN
  Administered 2019-06-25: 40 mg via INTRAVENOUS

## 2019-06-25 MED ORDER — ONDANSETRON HCL 4 MG/2ML IJ SOLN
INTRAMUSCULAR | Status: DC | PRN
  Administered 2019-06-25: 4 mg via INTRAVENOUS

## 2019-06-25 MED ORDER — DEXAMETHASONE SODIUM PHOSPHATE 10 MG/ML IJ SOLN
INTRAMUSCULAR | Status: DC | PRN
  Administered 2019-06-25: 10 mg via INTRAVENOUS

## 2019-06-25 MED ORDER — SUCCINYLCHOLINE CHLORIDE 200 MG/10ML IV SOSY
PREFILLED_SYRINGE | INTRAVENOUS | Status: DC | PRN
  Administered 2019-06-25: 100 mg via INTRAVENOUS

## 2019-06-25 MED ORDER — FENTANYL CITRATE (PF) 250 MCG/5ML IJ SOLN
INTRAMUSCULAR | Status: DC | PRN
  Administered 2019-06-25: 100 ug via INTRAVENOUS

## 2019-06-25 MED ORDER — PHENYLEPHRINE HCL-NACL 10-0.9 MG/250ML-% IV SOLN
INTRAVENOUS | Status: DC | PRN
  Administered 2019-06-25: 50 ug/min via INTRAVENOUS

## 2019-06-25 MED ORDER — ONDANSETRON HCL 4 MG/2ML IJ SOLN: 4.0000 mg | Freq: Once | INTRAMUSCULAR | Status: DC | PRN

## 2019-06-25 MED ORDER — SUGAMMADEX SODIUM 200 MG/2ML IV SOLN
INTRAVENOUS | Status: DC | PRN
  Administered 2019-06-25: 300 mg via INTRAVENOUS

## 2019-06-25 MED ORDER — PROPOFOL 10 MG/ML IV BOLUS
INTRAVENOUS | Status: DC | PRN
  Administered 2019-06-25: 90 mg via INTRAVENOUS

## 2019-06-25 MED ORDER — FENTANYL CITRATE (PF) 100 MCG/2ML IJ SOLN: 25.0000 ug | INTRAMUSCULAR | Status: DC | PRN

## 2019-06-25 MED ORDER — OXYCODONE HCL 5 MG/5ML PO SOLN: 5.0000 mg | Freq: Once | ORAL | Status: DC | PRN

## 2019-06-25 MED ORDER — LACTATED RINGERS IV SOLN: INTRAVENOUS | Status: DC | PRN

## 2019-06-25 MED ORDER — PHENYLEPHRINE 40 MCG/ML (10ML) SYRINGE FOR IV PUSH (FOR BLOOD PRESSURE SUPPORT)
PREFILLED_SYRINGE | INTRAVENOUS | Status: DC | PRN
  Administered 2019-06-25 (×2): 120 ug via INTRAVENOUS
  Administered 2019-06-25: 40 ug via INTRAVENOUS
  Administered 2019-06-25: 80 ug via INTRAVENOUS

## 2019-06-25 MED ORDER — LIDOCAINE 2% (20 MG/ML) 5 ML SYRINGE
INTRAMUSCULAR | Status: DC | PRN
  Administered 2019-06-25: 60 mg via INTRAVENOUS

## 2019-06-25 MED ORDER — OXYCODONE HCL 5 MG PO TABS: 5.0000 mg | ORAL_TABLET | Freq: Once | ORAL | Status: DC | PRN

## 2019-06-25 SURGICAL SUPPLY — 42 items
ADAPTER VALVE BIOPSY EBUS (MISCELLANEOUS) IMPLANT
ADPTR VALVE BIOPSY EBUS (MISCELLANEOUS)
BLADE CLIPPER SURG (BLADE) ×2 IMPLANT
CANISTER SUCT 3000ML PPV (MISCELLANEOUS) ×2 IMPLANT
CATH BALLN 4FR (CATHETERS) ×1 IMPLANT
CATH EMB 5FR 80CM (CATHETERS) ×1 IMPLANT
CATH LOADER DEPLOYMENT HUD (CATHETERS) ×1 IMPLANT
CNTNR URN SCR LID CUP LEK RST (MISCELLANEOUS) ×1 IMPLANT
CONT SPEC 4OZ STRL OR WHT (MISCELLANEOUS) ×2
COVER BACK TABLE 60X90IN (DRAPES) ×2 IMPLANT
FILTER STRAW FLUID ASPIR (MISCELLANEOUS) IMPLANT
FORCEPS BIOP RJ4 1.8 (CUTTING FORCEPS) IMPLANT
GAUZE SPONGE 4X4 12PLY STRL (GAUZE/BANDAGES/DRESSINGS) ×2 IMPLANT
GLOVE SURG SIGNA 7.5 PF LTX (GLOVE) ×2 IMPLANT
GOWN STRL REUS W/ TWL LRG LVL3 (GOWN DISPOSABLE) IMPLANT
GOWN STRL REUS W/ TWL XL LVL3 (GOWN DISPOSABLE) ×1 IMPLANT
GOWN STRL REUS W/TWL LRG LVL3 (GOWN DISPOSABLE) ×2
GOWN STRL REUS W/TWL XL LVL3 (GOWN DISPOSABLE) ×2
KIT AIRWAY SIZING HUD (KITS) ×1 IMPLANT
KIT CLEAN ENDO COMPLIANCE (KITS) ×2 IMPLANT
KIT TURNOVER KIT B (KITS) ×2 IMPLANT
MARKER SKIN DUAL TIP RULER LAB (MISCELLANEOUS) ×2 IMPLANT
NS IRRIG 1000ML POUR BTL (IV SOLUTION) ×2 IMPLANT
OIL SILICONE PENTAX (PARTS (SERVICE/REPAIRS)) ×1 IMPLANT
PAD ARMBOARD 7.5X6 YLW CONV (MISCELLANEOUS) ×4 IMPLANT
STOPCOCK 4 WAY LG BORE MALE ST (IV SETS) ×2 IMPLANT
STOPCOCK MORSE 400PSI 3WAY (MISCELLANEOUS) ×2 IMPLANT
SYR 10ML LL (SYRINGE) ×2 IMPLANT
SYR 20ML ECCENTRIC (SYRINGE) ×2 IMPLANT
SYR 3ML LL SCALE MARK (SYRINGE) ×1 IMPLANT
TOWEL GREEN STERILE (TOWEL DISPOSABLE) ×2 IMPLANT
TOWEL GREEN STERILE FF (TOWEL DISPOSABLE) ×2 IMPLANT
TOWEL NATURAL 4PK STERILE (DISPOSABLE) ×2 IMPLANT
TRAP SPECIMEN MUCUS 40CC (MISCELLANEOUS) ×2 IMPLANT
TUBE CONNECTING 20X1/4 (TUBING) ×2 IMPLANT
UNDERPAD 30X36 HEAVY ABSORB (UNDERPADS AND DIAPERS) ×2 IMPLANT
VALVE BIOPSY  SINGLE USE (MISCELLANEOUS) ×2
VALVE BIOPSY SINGLE USE (MISCELLANEOUS) ×1 IMPLANT
VALVE IN CARTRIDGE 7MM HUD (Valve) ×1 IMPLANT
VALVE IN CARTRIDGE 9MM HUD (Valve) ×1 IMPLANT
VALVE SUCTION BRONCHIO DISP (MISCELLANEOUS) ×2 IMPLANT
WATER STERILE IRR 1000ML POUR (IV SOLUTION) ×2 IMPLANT

## 2019-06-25 NOTE — Brief Op Note (Signed)
06/25/2019  8:31 AM  PATIENT:  Luis [name]  69 y.o. male  PRE-OPERATIVE DIAGNOSIS:  Persistent Airleak  POST-OPERATIVE DIAGNOSIS:  Persistent Airleak  PROCEDURE:  Procedure(s): VIDEO BRONCHOSCOPY WITH INSERTION OF INTERBRONCHIAL VALVE (IBV) (N/A) x 2  SURGEON:  Surgeon(s) and Role:    * [name], MD - Primary  PHYSICIAN ASSISTANT:   ASSISTANTS: none   ANESTHESIA:   general  EBL:  0 mL   BLOOD ADMINISTERED:none  DRAINS: none   LOCAL MEDICATIONS USED:  NONE  SPECIMEN:  No Specimen  DISPOSITION OF SPECIMEN:  N/A  COUNTS:  NO endo  TOURNIQUET:  * No tourniquets in log *  DICTATION: .Other Dictation: Dictation Number -  PLAN OF CARE: already inpatient  PATIENT DISPOSITION:  PACU - hemodynamically stable.   Delay start of Pharmacological VTE agent (>24hrs) due to surgical blood loss or risk of bleeding: no

## 2019-06-25 NOTE — Transfer of Care (Signed)
Immediate Anesthesia Transfer of Care Note  Patient: Luis [name]  Procedure(s) Performed: VIDEO BRONCHOSCOPY WITH INSERTION OF INTERBRONCHIAL VALVE (IBV); Size 7 IBV into Right Loer Lobe (RLL) Superior Segment, Size 9 IBV into RLL Basilar Segment (Right Chest)  Patient Location: PACU  Anesthesia Type:General  Level of Consciousness: drowsy and patient cooperative  Airway & Oxygen Therapy: Patient Spontanous Breathing and Patient connected to face mask oxygen  Post-op Assessment: Report given to RN and Post -op Vital signs reviewed and stable  Post vital signs: Reviewed and stable  Last Vitals:  Vitals Value Taken Time  BP 127/77 06/25/19 0837  Temp    Pulse 98 06/25/19 0839  Resp 20 06/25/19 0839  SpO2 100 % 06/25/19 0839  Vitals shown include unvalidated device data.  Last Pain:  Vitals:   06/25/19 0442  TempSrc: Oral  PainSc: 0-No pain      Patients Stated Pain Goal: 2 (32/44/01 0272)  Complications: No complications documented.

## 2019-06-25 NOTE — Anesthesia Postprocedure Evaluation (Signed)
Anesthesia Post Note  Patient: Luis [name]  Procedure(s) Performed: VIDEO BRONCHOSCOPY WITH INSERTION OF INTERBRONCHIAL VALVE (IBV); Size 7 IBV into Right Loer Lobe (RLL) Superior Segment, Size 9 IBV into RLL Basilar Segment (Right Chest)     Patient location during evaluation: PACU Anesthesia Type: General Level of consciousness: awake and alert Pain management: pain level controlled Vital Signs Assessment: post-procedure vital signs reviewed and stable Respiratory status: spontaneous breathing, nonlabored ventilation, respiratory function stable and patient connected to nasal cannula oxygen Cardiovascular status: blood pressure returned to baseline and stable Postop Assessment: no apparent nausea or vomiting Anesthetic complications: no   No complications documented.  Last Vitals:  Vitals:   06/25/19 0910 06/25/19 0930  BP: 115/84 127/79  Pulse: 99 97  Resp: 19 15  Temp:  36.9 C  SpO2: 92% 93%    Last Pain:  Vitals:   06/25/19 0930  TempSrc: Oral  PainSc: 0-No pain                 Audry Pili

## 2019-06-25 NOTE — Anesthesia Procedure Notes (Addendum)
Procedure Name: Intubation Date/Time: 06/25/2019 7:47 AM Performed by: Bryson Corona, CRNA Pre-anesthesia Checklist: Patient identified, Emergency Drugs available, Suction available and Patient being monitored Patient Re-evaluated:Patient Re-evaluated prior to induction Oxygen Delivery Method: Circle System Utilized Preoxygenation: Pre-oxygenation with 100% oxygen Induction Type: IV induction Ventilation: Oral airway inserted - appropriate to patient size and Two handed mask ventilation required Laryngoscope Size: Mac and 4 Grade View: Grade I Tube type: Oral Tube size: 8.5 mm Number of attempts: 1 Airway Equipment and Method: Stylet and Oral airway Placement Confirmation: ETT inserted through vocal cords under direct vision,  positive ETCO2 and breath sounds checked- equal and bilateral Secured at: 22 cm Tube secured with: Tape Dental Injury: Teeth and Oropharynx as per pre-operative assessment  Comments: Intubation performed by [facility]

## 2019-06-25 NOTE — Interval H&P Note (Signed)
History and Physical Interval Note:  06/25/2019 7:25 AM  Luis [name]  has presented today for surgery, with the diagnosis of airleak.  The various methods of treatment have been discussed with the patient and family. After consideration of risks, benefits and other options for treatment, the patient has consented to  Procedure(s): VIDEO BRONCHOSCOPY WITH INSERTION OF INTERBRONCHIAL VALVE (IBV) (N/A) as a surgical intervention.  The patient's history has been reviewed, patient examined, no change in status, stable for surgery.  I have reviewed the patient's chart and labs.  Questions were answered to the patient's satisfaction.     [name]

## 2019-06-25 NOTE — Op Note (Signed)
NAME: AWS, SHERE MEDICAL RECORD [id number] ACCOUNT [id number] DATE OF BIRTH:05/05/1950 FACILITY: [facility] LOCATION: [facility] PHYSICIAN:Tilmon Wisehart Chaya Jan, MD  OPERATIVE REPORT  DATE OF PROCEDURE:  06/25/2019  PREOPERATIVE DIAGNOSIS:  Persistent air leak after lobectomy.  POSTOPERATIVE DIAGNOSIS:  Persistent air leak after lobectomy.  PROCEDURE:  Bronchoscopy for intrabronchial valve placement x2.  SURGEON:  Modesto Charon, MD  ASSISTANT:  None.  ANESTHESIA:  General.  FINDINGS:  Resolution of air leak after occlusion of basilar and superior segmental lower lobe bronchi.  Bronchial stump well healed.  CLINICAL NOTE:  Mr. Ng is a 69 year old gentleman who had recently undergone a robotic-assisted right upper lobectomy.  He had a persistent air leak that would not tolerate waterseal.  He was advised to undergo bronchoscopy for intrabronchial valve  placement.  The indications, risks, benefits, and alternatives were discussed in detail with the patient.  He understood and accepted the risks and agreed to proceed.  He did understand that he would need a procedure in the future to remove the valves.  OPERATIVE NOTE:  Mr. Fullwood was brought to the operating room on 06/25/2019.  He had induction of general anesthesia and was intubated.  A timeout was performed.  Flexible fiberoptic bronchoscopy was performed via the endotracheal tube.  On the left,  there was normal endobronchial anatomy with no endobronchial lesions to the level of the subsegmental bronchi.  On the right, the right upper lobe bronchial stump appeared to be healing well.  Distally, there were no endobronchial lesions to the level of  the subsegmental bronchi.  A balloon catheter was advanced through the scope.  Occlusion of the bronchus intermedius resulted in complete resolution of the air leak over a couple of minutes.  Occlusion of the middle lobe bronchus had no effect.    Occlusion of the basilar bronchus and  the superior segmental bronchus both resulted in a decrease in the air leak, but not complete resolution.  The sizing balloon was used to select the appropriate valve size.  A 7 mm valve was deployed in the superior  segmental bronchus with good seating.  There was a decrease, but not elimination of the air leak.  A 9 mm valve then was deployed at the origin of the basilar segmental bronchi.  We then waited approximately 2 minutes and there was complete resolution  of the air leak.  The bronchoscope was removed.  The patient was extubated in the operating room and taken to the [facility] in good condition.  VN/NUANCE  D:06/25/2019 T:06/25/2019 JOB:[id number]

## 2019-06-26 ENCOUNTER — Inpatient Hospital Stay (HOSPITAL_COMMUNITY): Payer: Medicare Other

## 2019-06-26 MED ORDER — MORPHINE SULFATE (PF) 2 MG/ML IV SOLN
2.0000 mg | INTRAVENOUS | Status: DC | PRN
  Administered 2019-06-27 – 2019-07-01 (×6): 2 mg via INTRAVENOUS
  Filled 2019-06-26 (×6): qty 1

## 2019-06-26 MED ORDER — KETOROLAC TROMETHAMINE 15 MG/ML IJ SOLN
15.0000 mg | Freq: Four times a day (QID) | INTRAMUSCULAR | Status: DC
  Administered 2019-06-26 – 2019-06-27 (×4): 15 mg via INTRAVENOUS
  Filled 2019-06-26 (×4): qty 1

## 2019-06-26 NOTE — Progress Notes (Signed)
1 Day Post-Op Procedure(s) (LRB): VIDEO BRONCHOSCOPY WITH INSERTION OF INTERBRONCHIAL VALVE (IBV); Size 7 IBV into Right Loer Lobe (RLL) Superior Segment, Size 9 IBV into RLL Basilar Segment (Right) Subjective: C/o fullness on left side of chest and pain from incision  Objective: Vital signs in last 24 hours: Temp:  [97.5 F (36.4 C)-97.9 F (36.6 C)] 97.9 F (36.6 C) (06/12 0900) Pulse Rate:  [92-121] 94 (06/12 0900) Cardiac Rhythm: Normal sinus rhythm (06/12 0900) Resp:  [19-30] 30 (06/12 0900) BP: (121-151)/(74-92) 127/74 (06/12 0900) SpO2:  [89 %-99 %] 91 % (06/12 0900)  Hemodynamic parameters for last 24 hours:    Intake/Output from previous day: 06/11 0701 - 06/12 0700 In: 600 [I.V.:600] Out: 870 [Urine:800; Chest Tube:70] Intake/Output this shift: Total I/O In: 10 [I.V.:10] Out: -   General appearance: alert, cooperative and mild distress Neurologic: intact Heart: mildly tachy, regular Lungs: wheezes bilaterally + air leak, decreased from prior to procedure  Lab Results: No results for input(s): WBC, HGB, HCT, PLT in the last 72 hours. BMET: No results for input(s): NA, K, CL, CO2, GLUCOSE, BUN, CREATININE, CALCIUM in the last 72 hours.  PT/INR: No results for input(s): LABPROT, INR in the last 72 hours. ABG    Component Value Date/Time   PHART 7.387 06/18/2019 0500   HCO3 25.1 06/18/2019 0500   TCO2 28 06/17/2019 1225   ACIDBASEDEF 3.0 (H) 06/17/2019 1225   O2SAT 97.6 06/18/2019 0500   CBG (last 3)  No results for input(s): GLUCAP in the last 72 hours.  Assessment/Plan: S/P Procedure(s) (LRB): VIDEO BRONCHOSCOPY WITH INSERTION OF INTERBRONCHIAL VALVE (IBV); Size 7 IBV into Right Loer Lobe (RLL) Superior Segment, Size 9 IBV into RLL Basilar Segment (Right) -Persistent air leak after lobectomy, s/p IBV placement  Since IBV placement he has had a sensation of fullness in the left chest. On CXR has RLL atelectasis with over expansion of left lung, which  accounts for symptoms. That usually happens over time rather than in the short term as it did in his case. I think it will dissipate as his body has time to adapt. If not we may have to go back and take valves out. Will give toradol x 48 hours, continue oxycodone and morphine PRN  Luis [name] C. Roxan Hockey, MD [facility] [phone]    LOS: 9 days    Luis [name] 06/26/2019

## 2019-06-27 ENCOUNTER — Inpatient Hospital Stay (HOSPITAL_COMMUNITY): Payer: Medicare Other

## 2019-06-27 LAB — CBC
HCT: 40.3 % (ref 39.0–52.0)
Hemoglobin: 13.8 g/dL (ref 13.0–17.0)
MCH: 30.9 pg (ref 26.0–34.0)
MCHC: 34.2 g/dL (ref 30.0–36.0)
MCV: 90.2 fL (ref 80.0–100.0)
Platelets: 396 10*3/uL (ref 150–400)
RBC: 4.47 MIL/uL (ref 4.22–5.81)
RDW: 12.9 % (ref 11.5–15.5)
WBC: 13 10*3/uL — ABNORMAL HIGH (ref 4.0–10.5)
nRBC: 0 % (ref 0.0–0.2)

## 2019-06-27 LAB — BASIC METABOLIC PANEL
Anion gap: 10 (ref 5–15)
BUN: 35 mg/dL — ABNORMAL HIGH (ref 8–23)
CO2: 29 mmol/L (ref 22–32)
Calcium: 8.7 mg/dL — ABNORMAL LOW (ref 8.9–10.3)
Chloride: 95 mmol/L — ABNORMAL LOW (ref 98–111)
Creatinine, Ser: 1.16 mg/dL (ref 0.61–1.24)
GFR calc Af Amer: >60 mL/min (ref >60)
GFR calc non Af Amer: >60 mL/min (ref >60)
Glucose, Bld: 90 mg/dL (ref 70–99)
Potassium: 4 mmol/L (ref 3.5–5.1)
Sodium: 134 mmol/L — ABNORMAL LOW (ref 135–145)

## 2019-06-27 MED ORDER — LORAZEPAM 1 MG PO TABS
1.0000 mg | ORAL_TABLET | Freq: Four times a day (QID) | ORAL | Status: DC | PRN
  Administered 2019-06-27 – 2019-06-28 (×2): 1 mg via ORAL
  Filled 2019-06-27 (×2): qty 1

## 2019-06-27 NOTE — Progress Notes (Signed)
2 Days Post-Op Procedure(s) (LRB): VIDEO BRONCHOSCOPY WITH INSERTION OF INTERBRONCHIAL VALVE (IBV); Size 7 IBV into Right Loer Lobe (RLL) Superior Segment, Size 9 IBV into RLL Basilar Segment (Right) Subjective: Feels about the same Had left sided CP and SOB after Toradol given this AM- improved with morphine  Objective: Vital signs in last 24 hours: Temp:  [97.7 F (36.5 C)-98.6 F (37 C)] 98.6 F (37 C) (06/13 0741) Pulse Rate:  [93-113] 101 (06/13 0741) Cardiac Rhythm: Sinus tachycardia (06/13 0741) Resp:  [15-24] 15 (06/13 0741) BP: (110-144)/(63-88) 115/69 (06/13 0741) SpO2:  [94 %-99 %] 94 % (06/13 0741)  Hemodynamic parameters for last 24 hours:    Intake/Output from previous day: 06/12 0701 - 06/13 0700 In: 160 [P.O.:150; I.V.:10] Out: 325 [Urine:275; Chest Tube:50] Intake/Output this shift: Total I/O In: 6 [I.V.:6] Out: -   General appearance: alert, cooperative and mild distress Neurologic: intact Heart: tachy, regular Lungs: diminished on right, clear on left + air leak  Lab Results: Recent Labs    06/27/19 0217  WBC 13.0*  HGB 13.8  HCT 40.3  PLT 396   BMET:  Recent Labs    06/27/19 0217  NA 134*  K 4.0  CL 95*  CO2 29  GLUCOSE 90  BUN 35*  CREATININE 1.16  CALCIUM 8.7*    PT/INR: No results for input(s): LABPROT, INR in the last 72 hours. ABG    Component Value Date/Time   PHART 7.387 06/18/2019 0500   HCO3 25.1 06/18/2019 0500   TCO2 28 06/17/2019 1225   ACIDBASEDEF 3.0 (H) 06/17/2019 1225   O2SAT 97.6 06/18/2019 0500   CBG (last 3)  No results for input(s): GLUCAP in the last 72 hours.  Assessment/Plan: S/P Procedure(s) (LRB): VIDEO BRONCHOSCOPY WITH INSERTION OF INTERBRONCHIAL VALVE (IBV); Size 7 IBV into Right Loer Lobe (RLL) Superior Segment, Size 9 IBV into RLL Basilar Segment (Right) -Continues to have discomfort, CXR still shows over expansion of left lung with RLL atelectasis Will keep CT on suction again today-  hopefully can try water seal tomorrow Dc Toradol OOB WBC up slightly- no fever, recheck in AM   LOS: 10 days    [name] 06/27/2019

## 2019-06-28 ENCOUNTER — Encounter (HOSPITAL_COMMUNITY): Payer: Self-pay | Admitting: Thoracic Surgery (Cardiothoracic Vascular Surgery)

## 2019-06-28 ENCOUNTER — Inpatient Hospital Stay (HOSPITAL_COMMUNITY): Payer: Medicare Other

## 2019-06-28 LAB — CBC
HCT: 40.5 % (ref 39.0–52.0)
Hemoglobin: 13.9 g/dL (ref 13.0–17.0)
MCH: 30.7 pg (ref 26.0–34.0)
MCHC: 34.3 g/dL (ref 30.0–36.0)
MCV: 89.4 fL (ref 80.0–100.0)
Platelets: 406 10*3/uL — ABNORMAL HIGH (ref 150–400)
RBC: 4.53 MIL/uL (ref 4.22–5.81)
RDW: 12.7 % (ref 11.5–15.5)
WBC: 12.1 10*3/uL — ABNORMAL HIGH (ref 4.0–10.5)
nRBC: 0 % (ref 0.0–0.2)

## 2019-06-28 MED ORDER — ALPRAZOLAM 0.25 MG PO TABS
0.2500 mg | ORAL_TABLET | Freq: Two times a day (BID) | ORAL | Status: DC | PRN
  Administered 2019-06-28 – 2019-07-02 (×6): 0.25 mg via ORAL
  Filled 2019-06-28 (×6): qty 1

## 2019-06-28 NOTE — Progress Notes (Signed)
      [address]       [place] [address]             [phone]       Called to the bedside today regarding increase in subcutaneous emphysema now extending into bilateral lower arms and into the patient bilateral neck. He does still have some lower pubis subq air and scrotal swelling although this is improving per the patient. He and his wife are also worried about a "donald duck" voice. I increased suction. He has a chest xray scheduled for tomorrow am but I am ordering one for now STAT.  He remains on 2L [place] with good oxygen saturation.   Will review the CXR. Continue chest tube on -20 for now.   Nicholes Rough, PA-C

## 2019-06-28 NOTE — Progress Notes (Addendum)
      [address]       [place] [address]             [phone]       3 Days Post-Op Procedure(s) (LRB): VIDEO BRONCHOSCOPY WITH INSERTION OF INTERBRONCHIAL VALVE (IBV); Size 7 IBV into Right Loer Lobe (RLL) Superior Segment, Size 9 IBV into RLL Basilar Segment (Right)  Subjective: Patient states he had a "long weekend". He states his breathing is fine this am;he has complaints of pain at chest tube site  Objective: Vital signs in last 24 hours: Temp:  [98.2 F (36.8 C)-98.7 F (37.1 C)] 98.4 F (36.9 C) (06/14 0400) Pulse Rate:  [96-135] 135 (06/14 0400) Cardiac Rhythm: Supraventricular tachycardia (06/13 2116) Resp:  [15-21] 19 (06/14 0400) BP: (94-115)/(64-78) 94/67 (06/14 0400) SpO2:  [93 %-98 %] 98 % (06/14 0400)     Intake/Output from previous day: 06/13 0701 - 06/14 0700 In: 456 [P.O.:450; I.V.:6] Out: 350 [Urine:300; Chest Tube:50]   Physical Exam:  Cardiovascular: Slightly tachy Pulmonary: Mostly clear on the left and diminished on right. Subcutaneous emphysema right neck and bilateral chest wall Abdomen: Soft, non tender, bowel sounds present. Extremities: No lower extremity edema. Wounds: Dressing is clean and dry  Chest Tube: to suction, ++ + air leak   Lab Results: CBC: Recent Labs    06/27/19 0217 06/28/19 0227  WBC 13.0* 12.1*  HGB 13.8 13.9  HCT 40.3 40.5  PLT 396 406*   BMET:  Recent Labs    06/27/19 0217  NA 134*  K 4.0  CL 95*  CO2 29  GLUCOSE 90  BUN 35*  CREATININE 1.16  CALCIUM 8.7*    PT/INR: No results for input(s): LABPROT, INR in the last 72 hours. ABG:  INR: Will add last result for INR, ABG once components are confirmed Will add last 4 CBG results once components are confirmed  Assessment/Plan:  1. CV - ST. On Irbesartan 75 mg daily 2.  Pulmonary - History of COPD. On 2 liters of oxygen via [place]. Chest tube with 50 last 24 hours. Chest tube is to suction and there is a +++ leak. CXR this am  appears stable (bilateral neck and chest subcutaneous emphysema). Encourage incentive spirometer. Xopenex PRN, Mucinex., Tiotropium every am Bromide-Olodaterol inhaler  to give daily am starting 06/06.  3. Mild leukocytosis-WBC decreased to 12,100 and remains afebrile.  Donielle M ZimmermanPA-C 06/28/2019,7:23 AM [phone]  Patient seen and examined. Feels and looks a little better this morning Still has an air leak- will decrease suction to 10 cm. Hopefully can try water seal tomorrow.  Revonda Standard Roxan Hockey, MD [facility] [phone]

## 2019-06-29 ENCOUNTER — Inpatient Hospital Stay (HOSPITAL_COMMUNITY): Payer: Medicare Other

## 2019-06-29 NOTE — Plan of Care (Signed)

## 2019-06-29 NOTE — Progress Notes (Addendum)
      [address]       [place] [address]             [phone]       4 Days Post-Op Procedure(s) (LRB): VIDEO BRONCHOSCOPY WITH INSERTION OF INTERBRONCHIAL VALVE (IBV); Size 7 IBV into Right Loer Lobe (RLL) Superior Segment, Size 9 IBV into RLL Basilar Segment (Right)  Subjective: Events of yesterday noted. Patient states breathing is better now than last evening.  Objective: Vital signs in last 24 hours: Temp:  [97.7 F (36.5 C)-99 F (37.2 C)] 98.2 F (36.8 C) (06/15 0405) Pulse Rate:  [31-130] 31 (06/14 1905) Cardiac Rhythm: Normal sinus rhythm (06/15 0000) Resp:  [17-25] 24 (06/14 1905) BP: (94-127)/(62-74) 105/74 (06/14 2312) SpO2:  [94 %-100 %] 97 % (06/14 2312)     Intake/Output from previous day: 06/14 0701 - 06/15 0700 In: 240 [P.O.:240] Out: 660 [Urine:650; Chest Tube:10]   Physical Exam:  Cardiovascular: RRR Pulmonary: Mostly clear on the left and diminished on right. Subcutaneous emphysema bilater neck, arms, and chest wall Abdomen: Soft, non tender, bowel sounds present. Extremities: No lower extremity edema. Wounds: Dressing is clean and dry  Chest Tube: to suction, +++ air leak   Lab Results: CBC: Recent Labs    06/27/19 0217 06/28/19 0227  WBC 13.0* 12.1*  HGB 13.8 13.9  HCT 40.3 40.5  PLT 396 406*   BMET:  Recent Labs    06/27/19 0217  NA 134*  K 4.0  CL 95*  CO2 29  GLUCOSE 90  BUN 35*  CREATININE 1.16  CALCIUM 8.7*    PT/INR: No results for input(s): LABPROT, INR in the last 72 hours. ABG:  INR: Will add last result for INR, ABG once components are confirmed Will add last 4 CBG results once components are confirmed  Assessment/Plan:  1. CV - ST at times. On Irbesartan 75 mg daily 2.  Pulmonary - History of COPD. On 2 liters of oxygen via [place]. Chest tube with 20 last 24 hours. Chest tube is to - 20 cm suction and there is a +++ leak. CXR this am appears stable (bilateral neck and chest subcutaneous  emphysema). Encourage incentive spirometer. Xopenex PRN, Mucinex., Tiotropium every am Bromide-Olodaterol inhaler  to give daily am starting 06/06.    Sharalyn Ink ZimmermanPA-C 06/29/2019,7:12 AM [phone]  Patient seen and examined and examined, agree with above Will try on water seal, if he does not tolerate that we will have to reoperate  Remo Lipps C. Roxan Hockey, MD [facility] [phone]

## 2019-06-30 ENCOUNTER — Inpatient Hospital Stay (HOSPITAL_COMMUNITY): Payer: Medicare Other

## 2019-06-30 LAB — TYPE AND SCREEN
ABO/RH(D): A NEG
Antibody Screen: NEGATIVE

## 2019-06-30 MED ORDER — VANCOMYCIN HCL IN DEXTROSE 1-5 GM/200ML-% IV SOLN
1000.0000 mg | INTRAVENOUS | Status: AC
  Administered 2019-07-01: 1000 mg via INTRAVENOUS
  Filled 2019-06-30 (×2): qty 200

## 2019-06-30 NOTE — H&P (View-Only) (Signed)
      [address]       [place] [address]             [phone]       5 Days Post-Op Procedure(s) (LRB): VIDEO BRONCHOSCOPY WITH INSERTION OF INTERBRONCHIAL VALVE (IBV); Size 7 IBV into Right Loer Lobe (RLL) Superior Segment, Size 9 IBV into RLL Basilar Segment (Right)  Subjective: Patient sounds more nasally and he states he is more swollen.  Objective: Vital signs in last 24 hours: Temp:  [97.6 F (36.4 C)-98.5 F (36.9 C)] 98.5 F (36.9 C) (06/16 0709) Pulse Rate:  [80-109] 80 (06/16 0709) Cardiac Rhythm: Normal sinus rhythm (06/16 0411) Resp:  [14-20] 16 (06/16 0709) BP: (100-131)/(62-83) 119/72 (06/16 0709) SpO2:  [92 %-100 %] 100 % (06/16 0709)     Intake/Output from previous day: 06/15 0701 - 06/16 0700 In: 480 [P.O.:480] Out: 34 [Urine:3; Stool:1; Chest Tube:30]   Physical Exam:  Cardiovascular: RRR Pulmonary: Diminished bilaterally R>L. Increased subcutaneous emphysema bilater neck, arms, and chest  Abdomen: Soft, non tender, bowel sounds present. Extremities: No lower extremity edema. Wounds: Dressing is clean and dry  Chest Tube: on water seal, +++ air leak   Lab Results: CBC: Recent Labs    06/28/19 0227  WBC 12.1*  HGB 13.9  HCT 40.5  PLT 406*   BMET:  No results for input(s): NA, K, CL, CO2, GLUCOSE, BUN, CREATININE, CALCIUM in the last 72 hours.  PT/INR: No results for input(s): LABPROT, INR in the last 72 hours. ABG:  INR: Will add last result for INR, ABG once components are confirmed Will add last 4 CBG results once components are confirmed  Assessment/Plan:  1. CV - ST at times. On Irbesartan 75 mg daily 2.  Pulmonary - History of COPD. On 2 liters of oxygen via [place]. Chest tube with 30 last 24 hours. Chest tube is to water seal and there is a +++ leak. CXR this am appears to show right pneumothorax (apical and basilar) and subcutaneous emphysema. I put him back to 10 cm suction and he has had good oxygenation.  Will make NPO (unsure of timing and will defer to Dr. Roxan Hockey) as likely needs re operation Encourage incentive spirometer. Xopenex PRN, Mucinex., Tiotropium every am Bromide-Olodaterol inhaler  to give daily am starting 06/06.    Donielle M ZimmermanPA-C 06/30/2019,7:27 AM [phone]  Patient seen and examined, agree with above Initially tolerated course of waterseal, but overnight had worsening subcutaneous emphysema with extension up into his face and a pneumothorax on chest x-ray. I discussed the situation with Mr. Marlyn Corporal as well as Mrs. Mckeone.  I recommended that we go back to the operating room tomorrow for left VATS for repair of the air leak.  Whether that involves stapling or glue or some combination will be determined by intraoperative findings.  I informed of the indications, risk, benefits, and the alternatives.  The risks are similar to the risk from his original surgery including but not limited to death, MI, DVT, PE, bleeding, possible need for transfusion, infection, air leak, as well as other unforeseeable complications.  We may have to make an additional incision or connect some month his pre-existing incisions to perform the procedure.  He accepts the risk and agrees to proceed  Remo Lipps C. Roxan Hockey, MD [facility] [phone]

## 2019-06-30 NOTE — Progress Notes (Addendum)
      [address]       [place] [address]             [phone]       5 Days Post-Op Procedure(s) (LRB): VIDEO BRONCHOSCOPY WITH INSERTION OF INTERBRONCHIAL VALVE (IBV); Size 7 IBV into Right Loer Lobe (RLL) Superior Segment, Size 9 IBV into RLL Basilar Segment (Right)  Subjective: Patient sounds more nasally and he states he is more swollen.  Objective: Vital signs in last 24 hours: Temp:  [97.6 F (36.4 C)-98.5 F (36.9 C)] 98.5 F (36.9 C) (06/16 0709) Pulse Rate:  [80-109] 80 (06/16 0709) Cardiac Rhythm: Normal sinus rhythm (06/16 0411) Resp:  [14-20] 16 (06/16 0709) BP: (100-131)/(62-83) 119/72 (06/16 0709) SpO2:  [92 %-100 %] 100 % (06/16 0709)     Intake/Output from previous day: 06/15 0701 - 06/16 0700 In: 480 [P.O.:480] Out: 34 [Urine:3; Stool:1; Chest Tube:30]   Physical Exam:  Cardiovascular: RRR Pulmonary: Diminished bilaterally R>L. Increased subcutaneous emphysema bilater neck, arms, and chest  Abdomen: Soft, non tender, bowel sounds present. Extremities: No lower extremity edema. Wounds: Dressing is clean and dry  Chest Tube: on water seal, +++ air leak   Lab Results: CBC: Recent Labs    06/28/19 0227  WBC 12.1*  HGB 13.9  HCT 40.5  PLT 406*   BMET:  No results for input(s): NA, K, CL, CO2, GLUCOSE, BUN, CREATININE, CALCIUM in the last 72 hours.  PT/INR: No results for input(s): LABPROT, INR in the last 72 hours. ABG:  INR: Will add last result for INR, ABG once components are confirmed Will add last 4 CBG results once components are confirmed  Assessment/Plan:  1. CV - ST at times. On Irbesartan 75 mg daily 2.  Pulmonary - History of COPD. On 2 liters of oxygen via [place]. Chest tube with 30 last 24 hours. Chest tube is to water seal and there is a +++ leak. CXR this am appears to show right pneumothorax (apical and basilar) and subcutaneous emphysema. I put him back to 10 cm suction and he has had good oxygenation.  Will make NPO (unsure of timing and will defer to Dr. Roxan Hockey) as likely needs re operation Encourage incentive spirometer. Xopenex PRN, Mucinex., Tiotropium every am Bromide-Olodaterol inhaler  to give daily am starting 06/06.    Donielle M ZimmermanPA-C 06/30/2019,7:27 AM [phone]  Patient seen and examined, agree with above Initially tolerated course of waterseal, but overnight had worsening subcutaneous emphysema with extension up into his face and a pneumothorax on chest x-ray. I discussed the situation with Mr. Marlyn Corporal as well as Mrs. Kroboth.  I recommended that we go back to the operating room tomorrow for left VATS for repair of the air leak.  Whether that involves stapling or glue or some combination will be determined by intraoperative findings.  I informed of the indications, risk, benefits, and the alternatives.  The risks are similar to the risk from his original surgery including but not limited to death, MI, DVT, PE, bleeding, possible need for transfusion, infection, air leak, as well as other unforeseeable complications.  We may have to make an additional incision or connect some month his pre-existing incisions to perform the procedure.  He accepts the risk and agrees to proceed  Remo Lipps C. Roxan Hockey, MD [facility] [phone]

## 2019-06-30 NOTE — Plan of Care (Signed)

## 2019-07-01 ENCOUNTER — Encounter (HOSPITAL_COMMUNITY): Payer: Self-pay | Admitting: Thoracic Surgery (Cardiothoracic Vascular Surgery)

## 2019-07-01 ENCOUNTER — Inpatient Hospital Stay (HOSPITAL_COMMUNITY): Payer: Medicare Other

## 2019-07-01 ENCOUNTER — Inpatient Hospital Stay (HOSPITAL_COMMUNITY): Payer: Medicare Other | Admitting: Anesthesiology

## 2019-07-01 ENCOUNTER — Encounter (HOSPITAL_COMMUNITY)
Admission: RE | Disposition: A | Discharge status: Home Health | Payer: Self-pay | Source: Home / Self Care | Attending: Thoracic Surgery (Cardiothoracic Vascular Surgery)

## 2019-07-01 ENCOUNTER — Other Ambulatory Visit: Payer: Self-pay | Admitting: *Deleted

## 2019-07-01 DIAGNOSIS — J9382 Other air leak: Secondary | ICD-10-CM

## 2019-07-01 HISTORY — PX: CHEST TUBE INSERTION: SHX231

## 2019-07-01 HISTORY — PX: VIDEO ASSISTED THORACOSCOPY: SHX5073

## 2019-07-01 HISTORY — PX: INTERCOSTAL NERVE BLOCK: SHX5021

## 2019-07-01 LAB — BLOOD GAS, ARTERIAL
Acid-Base Excess: 3 mmol/L — ABNORMAL HIGH (ref 0.0–2.0)
Bicarbonate: 27 mmol/L (ref 20.0–28.0)
FIO2: 28
O2 Saturation: 94.4 %
Patient temperature: 36.5
pCO2 arterial: 41 mmHg (ref 32.0–48.0)
pH, Arterial: 7.432 (ref 7.350–7.450)
pO2, Arterial: 68 mmHg — ABNORMAL LOW (ref 83.0–108.0)

## 2019-07-01 LAB — URINALYSIS, ROUTINE W REFLEX MICROSCOPIC
Bilirubin Urine: NEGATIVE
Glucose, UA: NEGATIVE mg/dL
Hgb urine dipstick: NEGATIVE
Ketones, ur: 20 mg/dL — AB
Leukocytes,Ua: NEGATIVE
Nitrite: NEGATIVE
Protein, ur: NEGATIVE mg/dL
Specific Gravity, Urine: 1.029 (ref 1.005–1.030)
pH: 5 (ref 5.0–8.0)

## 2019-07-01 LAB — GLUCOSE, CAPILLARY
Glucose-Capillary: 101 mg/dL — ABNORMAL HIGH (ref 70–99)
Glucose-Capillary: 126 mg/dL — ABNORMAL HIGH (ref 70–99)

## 2019-07-01 LAB — PROTIME-INR
INR: 1 (ref 0.8–1.2)
Prothrombin Time: 13.2 seconds (ref 11.4–15.2)

## 2019-07-01 LAB — APTT: aPTT: 38 seconds — ABNORMAL HIGH (ref 24–36)

## 2019-07-01 SURGERY — VIDEO ASSISTED THORACOSCOPY
Anesthesia: General | Site: Chest | Laterality: Right

## 2019-07-01 MED ORDER — FENTANYL CITRATE (PF) 100 MCG/2ML IJ SOLN
INTRAMUSCULAR | Status: DC | PRN
  Administered 2019-07-01 (×3): 50 ug via INTRAVENOUS
  Administered 2019-07-01: 100 ug via INTRAVENOUS

## 2019-07-01 MED ORDER — TRAMADOL HCL 50 MG PO TABS
50.0000 mg | ORAL_TABLET | Freq: Four times a day (QID) | ORAL | Status: DC | PRN
  Administered 2019-07-01 – 2019-07-04 (×7): 50 mg via ORAL
  Filled 2019-07-01 (×7): qty 1

## 2019-07-01 MED ORDER — LACTATED RINGERS IV SOLN: INTRAVENOUS | Status: DC

## 2019-07-01 MED ORDER — FENTANYL CITRATE (PF) 100 MCG/2ML IJ SOLN: 50.0000 ug | Freq: Once | INTRAMUSCULAR | Status: AC

## 2019-07-01 MED ORDER — FENTANYL CITRATE (PF) 250 MCG/5ML IJ SOLN
INTRAMUSCULAR | Status: AC
  Filled 2019-07-01: qty 5

## 2019-07-01 MED ORDER — VASOPRESSIN 20 UNIT/ML IV SOLN
INTRAVENOUS | Status: DC | PRN
Start: 2019-07-01 — End: 2019-07-01
  Administered 2019-07-01: 2 [IU] via INTRAVENOUS

## 2019-07-01 MED ORDER — SODIUM CHLORIDE 0.9 % IV SOLN: INTRAVENOUS | Status: DC | PRN

## 2019-07-01 MED ORDER — BISACODYL 5 MG PO TBEC
10.0000 mg | DELAYED_RELEASE_TABLET | Freq: Every day | ORAL | Status: DC
  Administered 2019-07-02 – 2019-07-05 (×3): 10 mg via ORAL
  Filled 2019-07-01 (×3): qty 2

## 2019-07-01 MED ORDER — OXYCODONE HCL 5 MG PO TABS
5.0000 mg | ORAL_TABLET | ORAL | Status: DC | PRN
  Administered 2019-07-01 – 2019-07-05 (×12): 5 mg via ORAL
  Filled 2019-07-01 (×11): qty 1

## 2019-07-01 MED ORDER — CHLORHEXIDINE GLUCONATE CLOTH 2 % EX PADS
6.0000 | MEDICATED_PAD | Freq: Every day | CUTANEOUS | Status: DC
  Administered 2019-07-02 – 2019-07-05 (×4): 6 via TOPICAL

## 2019-07-01 MED ORDER — 0.9 % SODIUM CHLORIDE (POUR BTL) OPTIME
TOPICAL | Status: DC | PRN
  Administered 2019-07-01: 2000 mL
  Administered 2019-07-01 (×2): 1000 mL
  Administered 2019-07-01: 2000 mL
  Administered 2019-07-01: 1000 mL

## 2019-07-01 MED ORDER — KETOROLAC TROMETHAMINE 30 MG/ML IJ SOLN
15.0000 mg | Freq: Four times a day (QID) | INTRAMUSCULAR | Status: AC
  Administered 2019-07-01 – 2019-07-03 (×8): 15 mg via INTRAVENOUS
  Filled 2019-07-01 (×9): qty 1

## 2019-07-01 MED ORDER — PHENYLEPHRINE 40 MCG/ML (10ML) SYRINGE FOR IV PUSH (FOR BLOOD PRESSURE SUPPORT)
PREFILLED_SYRINGE | INTRAVENOUS | Status: DC | PRN
  Administered 2019-07-01: 160 ug via INTRAVENOUS
  Administered 2019-07-01: 80 ug via INTRAVENOUS
  Administered 2019-07-01: 160 ug via INTRAVENOUS

## 2019-07-01 MED ORDER — FENTANYL CITRATE (PF) 100 MCG/2ML IJ SOLN: 25.0000 ug | INTRAMUSCULAR | Status: DC | PRN

## 2019-07-01 MED ORDER — PHENYLEPHRINE 40 MCG/ML (10ML) SYRINGE FOR IV PUSH (FOR BLOOD PRESSURE SUPPORT)
PREFILLED_SYRINGE | INTRAVENOUS | Status: AC
  Filled 2019-07-01: qty 10

## 2019-07-01 MED ORDER — SODIUM CHLORIDE FLUSH 0.9 % IV SOLN
INTRAVENOUS | Status: DC | PRN
  Administered 2019-07-01: 100 mL

## 2019-07-01 MED ORDER — CEFAZOLIN SODIUM-DEXTROSE 2-4 GM/100ML-% IV SOLN
2.0000 g | Freq: Three times a day (TID) | INTRAVENOUS | Status: AC
  Administered 2019-07-01 – 2019-07-02 (×2): 2 g via INTRAVENOUS
  Filled 2019-07-01 (×2): qty 100

## 2019-07-01 MED ORDER — VASOPRESSIN 20 UNIT/ML IV SOLN
INTRAVENOUS | Status: AC
  Filled 2019-07-01: qty 1

## 2019-07-01 MED ORDER — CHLORHEXIDINE GLUCONATE 0.12 % MT SOLN
OROMUCOSAL | Status: AC
  Administered 2019-07-01: 15 mL via OROMUCOSAL
  Filled 2019-07-01: qty 15

## 2019-07-01 MED ORDER — INSULIN ASPART 100 UNIT/ML ~~LOC~~ SOLN
0.0000 [IU] | Freq: Four times a day (QID) | SUBCUTANEOUS | Status: DC
  Administered 2019-07-01 – 2019-07-02 (×3): 2 [IU] via SUBCUTANEOUS

## 2019-07-01 MED ORDER — PROPOFOL 10 MG/ML IV BOLUS
INTRAVENOUS | Status: DC | PRN
  Administered 2019-07-01: 80 mg via INTRAVENOUS

## 2019-07-01 MED ORDER — ONDANSETRON HCL 4 MG/2ML IJ SOLN
INTRAMUSCULAR | Status: DC | PRN
  Administered 2019-07-01: 4 mg via INTRAVENOUS

## 2019-07-01 MED ORDER — DEXAMETHASONE SODIUM PHOSPHATE 10 MG/ML IJ SOLN
INTRAMUSCULAR | Status: AC
  Filled 2019-07-01: qty 1

## 2019-07-01 MED ORDER — ACETAMINOPHEN 500 MG PO TABS
1000.0000 mg | ORAL_TABLET | Freq: Four times a day (QID) | ORAL | Status: DC
  Administered 2019-07-01 – 2019-07-06 (×19): 1000 mg via ORAL
  Filled 2019-07-01 (×20): qty 2

## 2019-07-01 MED ORDER — LIDOCAINE 2% (20 MG/ML) 5 ML SYRINGE
INTRAMUSCULAR | Status: DC | PRN
  Administered 2019-07-01: 60 mg via INTRAVENOUS
  Administered 2019-07-01: 20 mg via INTRAVENOUS

## 2019-07-01 MED ORDER — ACETAMINOPHEN 160 MG/5ML PO SOLN: 1000.0000 mg | Freq: Four times a day (QID) | ORAL | Status: DC

## 2019-07-01 MED ORDER — MIDAZOLAM HCL 2 MG/2ML IJ SOLN
INTRAMUSCULAR | Status: AC
  Administered 2019-07-01: 1 mg via INTRAVENOUS
  Filled 2019-07-01: qty 2

## 2019-07-01 MED ORDER — ROCURONIUM BROMIDE 10 MG/ML (PF) SYRINGE
PREFILLED_SYRINGE | INTRAVENOUS | Status: AC
  Filled 2019-07-01: qty 10

## 2019-07-01 MED ORDER — PROPOFOL 10 MG/ML IV BOLUS
INTRAVENOUS | Status: AC
  Filled 2019-07-01: qty 20

## 2019-07-01 MED ORDER — DEXAMETHASONE SODIUM PHOSPHATE 10 MG/ML IJ SOLN
INTRAMUSCULAR | Status: DC | PRN
  Administered 2019-07-01: 5 mg via INTRAVENOUS

## 2019-07-01 MED ORDER — PHENYLEPHRINE HCL-NACL 10-0.9 MG/250ML-% IV SOLN
INTRAVENOUS | Status: DC | PRN
  Administered 2019-07-01: 50 ug/min via INTRAVENOUS

## 2019-07-01 MED ORDER — ONDANSETRON HCL 4 MG/2ML IJ SOLN: 4.0000 mg | Freq: Four times a day (QID) | INTRAMUSCULAR | Status: DC | PRN

## 2019-07-01 MED ORDER — ROCURONIUM BROMIDE 10 MG/ML (PF) SYRINGE
PREFILLED_SYRINGE | INTRAVENOUS | Status: DC | PRN
  Administered 2019-07-01: 40 mg via INTRAVENOUS
  Administered 2019-07-01: 60 mg via INTRAVENOUS

## 2019-07-01 MED ORDER — ONDANSETRON HCL 4 MG/2ML IJ SOLN
INTRAMUSCULAR | Status: AC
  Filled 2019-07-01: qty 2

## 2019-07-01 MED ORDER — LIDOCAINE 2% (20 MG/ML) 5 ML SYRINGE
INTRAMUSCULAR | Status: AC
  Filled 2019-07-01: qty 5

## 2019-07-01 MED ORDER — BUPIVACAINE HCL (PF) 0.5 % IJ SOLN
INTRAMUSCULAR | Status: AC
  Filled 2019-07-01: qty 30

## 2019-07-01 MED ORDER — POTASSIUM CHLORIDE IN NACL 20-0.9 MEQ/L-% IV SOLN
INTRAVENOUS | Status: DC
  Filled 2019-07-01 (×2): qty 1000

## 2019-07-01 MED ORDER — FENTANYL CITRATE (PF) 100 MCG/2ML IJ SOLN
INTRAMUSCULAR | Status: AC
  Administered 2019-07-01: 50 ug via INTRAVENOUS
  Filled 2019-07-01: qty 2

## 2019-07-01 MED ORDER — BUPIVACAINE LIPOSOME 1.3 % IJ SUSP
20.0000 mL | Freq: Once | INTRAMUSCULAR | Status: DC
  Filled 2019-07-01: qty 20

## 2019-07-01 MED ORDER — CHLORHEXIDINE GLUCONATE 0.12 % MT SOLN: 15.0000 mL | Freq: Once | OROMUCOSAL | Status: AC

## 2019-07-01 MED ORDER — LACTATED RINGERS IV SOLN
INTRAVENOUS | Status: DC | PRN
Start: 2019-07-01 — End: 2019-07-01

## 2019-07-01 MED ORDER — ONDANSETRON HCL 4 MG/2ML IJ SOLN: 4.0000 mg | Freq: Once | INTRAMUSCULAR | Status: DC | PRN

## 2019-07-01 MED ORDER — SENNOSIDES-DOCUSATE SODIUM 8.6-50 MG PO TABS
1.0000 | ORAL_TABLET | Freq: Every day | ORAL | Status: DC
  Administered 2019-07-01 – 2019-07-02 (×2): 1 via ORAL
  Filled 2019-07-01 (×5): qty 1

## 2019-07-01 MED ORDER — ORAL CARE MOUTH RINSE: 15.0000 mL | Freq: Once | OROMUCOSAL | Status: AC

## 2019-07-01 MED ORDER — MIDAZOLAM HCL 2 MG/2ML IJ SOLN: 1.0000 mg | Freq: Once | INTRAMUSCULAR | Status: AC

## 2019-07-01 SURGICAL SUPPLY — 97 items
APPLICATOR COTTON TIP 6 STRL (MISCELLANEOUS) IMPLANT
APPLICATOR COTTON TIP 6IN STRL (MISCELLANEOUS) IMPLANT
APPLIER CLIP ROT 10 11.4 M/L (STAPLE)
BLADE CLIPPER SURG (BLADE) ×1 IMPLANT
CANISTER SUCT 3000ML PPV (MISCELLANEOUS) ×3 IMPLANT
CATH THORACIC 28FR RT ANG (CATHETERS) IMPLANT
CATH THORACIC 36FR (CATHETERS) IMPLANT
CATH THORACIC 36FR RT ANG (CATHETERS) IMPLANT
CLIP APPLIE ROT 10 11.4 M/L (STAPLE) IMPLANT
CLIP VESOCCLUDE MED 6/CT (CLIP) IMPLANT
CNTNR URN SCR LID CUP LEK RST (MISCELLANEOUS) ×2 IMPLANT
CONN Y 3/8X3/8X3/8  BEN (MISCELLANEOUS) ×3
CONN Y 3/8X3/8X3/8 BEN (MISCELLANEOUS) ×1 IMPLANT
CONT SPEC 4OZ STRL OR WHT (MISCELLANEOUS) ×9
COVER SURGICAL LIGHT HANDLE (MISCELLANEOUS) ×1 IMPLANT
DERMABOND ADVANCED (GAUZE/BANDAGES/DRESSINGS) ×2
DERMABOND ADVANCED .7 DNX12 (GAUZE/BANDAGES/DRESSINGS) IMPLANT
DRAIN CHANNEL 28F RND 3/8 FF (WOUND CARE) ×2 IMPLANT
DRAPE CV SPLIT W-CLR ANES SCRN (DRAPES) ×3 IMPLANT
DRAPE ORTHO SPLIT 77X108 STRL (DRAPES) ×3
DRAPE SLUSH/WARMER DISC (DRAPES) ×3 IMPLANT
DRAPE SURG ORHT 6 SPLT 77X108 (DRAPES) ×1 IMPLANT
ELECT BLADE 6.5 EXT (BLADE) ×5 IMPLANT
ELECT REM PT RETURN 9FT ADLT (ELECTROSURGICAL) ×3
ELECTRODE REM PT RTRN 9FT ADLT (ELECTROSURGICAL) ×1 IMPLANT
GAUZE SPONGE 4X4 12PLY STRL (GAUZE/BANDAGES/DRESSINGS) ×3 IMPLANT
GAUZE SPONGE 4X4 12PLY STRL LF (GAUZE/BANDAGES/DRESSINGS) ×2 IMPLANT
GLOVE BIO SURGEON STRL SZ 6.5 (GLOVE) ×1 IMPLANT
GLOVE BIO SURGEONS STRL SZ 6.5 (GLOVE) ×1
GLOVE BIOGEL PI IND STRL 6.5 (GLOVE) IMPLANT
GLOVE BIOGEL PI IND STRL 8 (GLOVE) IMPLANT
GLOVE BIOGEL PI INDICATOR 6.5 (GLOVE) ×6
GLOVE BIOGEL PI INDICATOR 8 (GLOVE) ×4
GLOVE SURG SIGNA 7.5 PF LTX (GLOVE) ×6 IMPLANT
GLOVE TRIUMPH SURG SIZE 7.5 (KITS) ×3 IMPLANT
GOWN STRL REUS W/ TWL LRG LVL3 (GOWN DISPOSABLE) ×2 IMPLANT
GOWN STRL REUS W/ TWL XL LVL3 (GOWN DISPOSABLE) ×1 IMPLANT
GOWN STRL REUS W/TWL LRG LVL3 (GOWN DISPOSABLE) ×9
GOWN STRL REUS W/TWL XL LVL3 (GOWN DISPOSABLE) ×3
HANDLE STAPLE ENDO GIA SHORT (STAPLE) ×2
HEMOSTAT SURGICEL 2X14 (HEMOSTASIS) IMPLANT
IV CATH 22GX1 FEP (IV SOLUTION) IMPLANT
KIT BASIN OR (CUSTOM PROCEDURE TRAY) ×3 IMPLANT
KIT SUCTION CATH 14FR (SUCTIONS) ×3 IMPLANT
KIT TURNOVER KIT B (KITS) ×3 IMPLANT
NDL HYPO 25GX1X1/2 BEV (NEEDLE) ×1 IMPLANT
NDL SPNL 22GX3.5 QUINCKE BK (NEEDLE) IMPLANT
NEEDLE HYPO 25GX1X1/2 BEV (NEEDLE) ×3 IMPLANT
NEEDLE SPNL 22GX3.5 QUINCKE BK (NEEDLE) ×3 IMPLANT
NS IRRIG 1000ML POUR BTL (IV SOLUTION) ×16 IMPLANT
PACK CHEST (CUSTOM PROCEDURE TRAY) ×3 IMPLANT
PAD ARMBOARD 7.5X6 YLW CONV (MISCELLANEOUS) ×6 IMPLANT
POUCH ENDO CATCH II 15MM (MISCELLANEOUS) IMPLANT
POUCH SPECIMEN RETRIEVAL 10MM (ENDOMECHANICALS) IMPLANT
RELOAD STAPLE 45 PURP MED/THCK (STAPLE) IMPLANT
RELOAD TRI 45 ART MED THCK BLK (STAPLE) ×2 IMPLANT
RELOAD TRI 45 ART MED THCK PUR (STAPLE) ×6 IMPLANT
RELOAD TRI 60 ART MED THCK BLK (STAPLE) ×2 IMPLANT
SEALANT PROGEL (MISCELLANEOUS) IMPLANT
SEALANT SURG COSEAL 4ML (VASCULAR PRODUCTS) IMPLANT
SEALANT SURG COSEAL 8ML (VASCULAR PRODUCTS) IMPLANT
SOL ANTI FOG 6CC (MISCELLANEOUS) ×1 IMPLANT
SOLUTION ANTI FOG 6CC (MISCELLANEOUS) ×2
SPECIMEN JAR MEDIUM (MISCELLANEOUS) ×3 IMPLANT
SPONGE INTESTINAL PEANUT (DISPOSABLE) ×4 IMPLANT
SPONGE TONSIL TAPE 1 RFD (DISPOSABLE) ×3 IMPLANT
STAPLER ENDO GIA 12 SHRT THIN (STAPLE) IMPLANT
STAPLER ENDO GIA 12MM SHORT (STAPLE) ×1 IMPLANT
STOPCOCK 4 WAY LG BORE MALE ST (IV SETS) ×1 IMPLANT
SUT PROLENE 4 0 RB 1 (SUTURE) ×3
SUT PROLENE 4-0 RB1 .5 CRCL 36 (SUTURE) IMPLANT
SUT SILK  1 MH (SUTURE) ×6
SUT SILK 1 MH (SUTURE) ×2 IMPLANT
SUT SILK 2 0SH CR/8 30 (SUTURE) IMPLANT
SUT SILK 3 0SH CR/8 30 (SUTURE) IMPLANT
SUT VIC AB 1 CTX 36 (SUTURE) ×3
SUT VIC AB 1 CTX36XBRD ANBCTR (SUTURE) IMPLANT
SUT VIC AB 2-0 CTX 36 (SUTURE) ×2 IMPLANT
SUT VIC AB 2-0 UR6 27 (SUTURE) ×2 IMPLANT
SUT VIC AB 3-0 MH 27 (SUTURE) IMPLANT
SUT VIC AB 3-0 X1 27 (SUTURE) ×3 IMPLANT
SUT VICRYL 2 TP 1 (SUTURE) IMPLANT
SYR 10ML LL (SYRINGE) ×3 IMPLANT
SYR 20ML LL LF (SYRINGE) ×2 IMPLANT
SYR 50ML LL SCALE MARK (SYRINGE) ×3 IMPLANT
SYSTEM SAHARA CHEST DRAIN ATS (WOUND CARE) ×3 IMPLANT
TAPE CLOTH 4X10 WHT NS (GAUZE/BANDAGES/DRESSINGS) ×3 IMPLANT
TAPE CLOTH SURG 4X10 WHT LF (GAUZE/BANDAGES/DRESSINGS) ×2 IMPLANT
TIP APPLICATOR SPRAY EXTEND 16 (VASCULAR PRODUCTS) IMPLANT
TOWEL GREEN STERILE (TOWEL DISPOSABLE) ×3 IMPLANT
TOWEL GREEN STERILE FF (TOWEL DISPOSABLE) ×3 IMPLANT
TRAY FOLEY MTR SLVR 16FR STAT (SET/KITS/TRAYS/PACK) ×3 IMPLANT
TRAY WAYNE PNEUMOTHORAX 14X18 (TRAY / TRAY PROCEDURE) ×2 IMPLANT
TROCAR XCEL BLADELESS 5X75MML (TROCAR) ×5 IMPLANT
TROCAR XCEL NON-BLD 5MMX100MML (ENDOMECHANICALS) IMPLANT
TUBING EXTENTION W/L.L. (IV SETS) ×1 IMPLANT
WATER STERILE IRR 1000ML POUR (IV SOLUTION) ×6 IMPLANT

## 2019-07-01 NOTE — Anesthesia Procedure Notes (Signed)
Central Venous Catheter Insertion Performed by: Catalina Gravel, MD, anesthesiologist Start/End6/17/2021 10:00 AM, 07/01/2019 10:10 AM Preanesthetic checklist: patient identified, IV checked, site marked, risks and benefits discussed, surgical consent, monitors and equipment checked, pre-op evaluation, timeout performed and anesthesia consent Position: Trendelenburg Lidocaine 1% used for infiltration and patient sedated Hand hygiene performed , maximum sterile barriers used  and Seldinger technique used Catheter size: 8 Fr Total catheter length 16. Central line was placed.Double lumen Procedure performed using ultrasound guided technique. Ultrasound Notes:anatomy identified, needle tip was noted to be adjacent to the nerve/plexus identified, no ultrasound evidence of intravascular and/or intraneural injection and image(s) printed for medical record Attempts: 1 Following insertion, line sutured, dressing applied and Biopatch. Post procedure assessment: blood return through all ports, free fluid flow and no air  Patient tolerated the procedure well with no immediate complications.

## 2019-07-01 NOTE — Anesthesia Procedure Notes (Signed)
Arterial Line Insertion Start/End6/17/2021 10:00 AM, 07/01/2019 10:10 AM Performed by: Griffin Dakin, CRNA, CRNA  Patient location: Pre-op. Preanesthetic checklist: patient identified, IV checked, site marked, risks and benefits discussed, surgical consent, monitors and equipment checked, pre-op evaluation and timeout performed Lidocaine 1% used for infiltration Right, radial was placed Catheter size: 20 G Hand hygiene performed , maximum sterile barriers used  and Seldinger technique used Allen's test indicative of satisfactory collateral circulation Attempts: 1 Procedure performed without using ultrasound guided technique. Following insertion, Biopatch and dressing applied. Post procedure assessment: normal  Patient tolerated the procedure well with no immediate complications.

## 2019-07-01 NOTE — Interval H&P Note (Signed)
History and Physical Interval Note:  07/01/2019 10:12 AM  Luis [name]  has presented today for surgery, with the diagnosis of PERSISTANT AIR LEAK.  The various methods of treatment have been discussed with the patient and family. After consideration of risks, benefits and other options for treatment, the patient has consented to  Procedure(s): VIDEO ASSISTED THORACOSCOPY (Right) as a surgical intervention.  The patient's history has been reviewed, patient examined, no change in status, stable for surgery.  I have reviewed the patient's chart and labs.  Questions were answered to the patient's satisfaction.     [name]

## 2019-07-01 NOTE — Op Note (Signed)
NAME: Luis [name], POER MEDICAL RECORD OV:[id number] ACCOUNT [id number] DATE OF BIRTH:1950/08/05 FACILITY: [facility] LOCATION: [facility] PHYSICIAN:Analisse Randle Chaya Jan, MD  OPERATIVE REPORT  DATE OF PROCEDURE:  07/01/2019  PREOPERATIVE DIAGNOSIS:  Persistent air leak after right upper lobectomy.  POSTOPERATIVE DIAGNOSIS:  Persistent air leak after right upper lobectomy.  PROCEDURES:   1.  Right video-assisted thoracoscopy for stapling of bleb.   2.  Intercostal nerve blocks at levels 3 through 10.  SURGEON:  Modesto Charon, MD   ASSISTANT:  Lars Pinks, PA-C  ANESTHESIA:  General.  FINDINGS:  Very small hole (ruptured bleb) in the lateral aspect of right middle lobe. Tiny unruptured bleb adjacent. No air leak post-stapling.  CLINICAL NOTE:  [name] is a 69 year old gentleman who had undergone a robotic right upper lobectomy on 06/17/2019.  He had an air leak postoperatively and was taken back to the operating room for placement of intrabronchial valves in the right lower  lobe on 06/25/2019.  His air leak improved initially, but then worsened again and he was advised to undergo reexploration for attempt to repair the air leaks.  The indications, risks, benefits, and alternatives were discussed in detail with the patient.   He understood and accepted the risks and agreed to proceed.  The patient already had bronchoscopy to confirm good healing of the bronchial stump, so repeat bronchoscopy was not planned for this procedure.  OPERATIVE NOTE:  [name] was brought to the preoperative holding area on 07/01/2019.  Anesthesia established adequate venous access and then also placed an arterial blood pressure monitoring line.  He was taken to the operating room, anesthetized and  intubated with a double lumen endotracheal tube.  Intravenous antibiotics were administered.  A Foley catheter was placed.  Sequential compression devices were placed on the calves for DVT prophylaxis.  He  was placed in a left lateral decubitus position  and the right chest was prepped and draped in the usual sterile fashion.  Single-lung ventilation of the left lung was initiated and was tolerated well throughout the procedure.  After initiating single-lung ventilation, the indwelling pigtail catheter  was removed.  The chest was prepped and draped in the usual sterile fashion.  A timeout was performed.  A solution containing 20 mL of liposomal bupivacaine, 30 mL of 0.5% bupivacaine and 50 mL of saline was prepared.  This was used for local at the incision sites as well as for the intercostal nerve blocks.  The preexisting  incision in the 8th interspace was opened and a 5 mm port was inserted.  The thoracoscope was advanced into the chest.  There was good isolation of the right lung.  There was a small amount of serous fluid and a small amount of fibrinous exudate.  A 4 cm  working incision was made in the 4th interspace anterolaterally.  No rib spreading was performed during the procedure.  Inspection of the lung initially showed no obvious leak site.  The staple lines all appeared to be healing well.  The chest was  filled with warm saline and the right lung was ventilated.  There was no evidence of any air leak from around the bronchial stump, which was not able to be visualized due to the adherent overlying lung.  All the staple lines appeared to be intact with no evidence  of air leakage from those.  Inspection of the middle lobe showed a small, approximately 2 mm, hole in the lateral aspect of the middle lobe that was leaking air and  that was the only identifiable site of an air leak present. This bleb was removed  using a Covidien stapler.  Purple and black cartridges reinforced with pericardium were used.  The bleb was sent for permanent pathology.  The chest was again filled with warm saline and there was no additional air leak, but close inspection of  the middle lobe showed another area where  there was a very small bleb, approximately 3 or 4 mm and this was in close proximity to the previous site of the air leak.  The decision was made to staple this area as well.  This was done again using the  Covidien stapler with a reinforced staple line.  There was no removed specimen with that stapling.  The chest was again copiously irrigated with warm saline.  No air leak was identified with ventilation and bagging of the lung.  It should be noted that  for placement of the staplers, the anterior 8th interspace was opened and the stapler was advanced in and out through that to achieve a good angle on the lung.  A 28-French Blake drain was placed through the midaxillary line port incision and secured  with a #1 silk suture.  A 14-French pigtail catheter was placed just superior to where the previous pigtail catheter had been in place and it was secured with a #1 silk suture as well.  Dual-lung ventilation was resumed.  The 2 remaining incisions were  closed with #1 Vicryl fascial sutures.  The subcutaneous tissue and skin were closed in standard fashion.  Dermabond was applied.  The chest tubes were placed to a Pleur-evac.  The patient was extubated in the operating room and taken to the  [facility] in good condition.  There was no air leak at the time of leaving the operating room.  VN/NUANCE  D:07/01/2019 T:07/01/2019 JOB:[id number]

## 2019-07-01 NOTE — Anesthesia Preprocedure Evaluation (Addendum)
Anesthesia Evaluation  Patient identified by MRN, date of birth, ID band Patient awake    Reviewed: Allergy & Precautions, NPO status , Patient's Chart, lab work & pertinent test results  Airway Mallampati: II  TM Distance: >3 FB Neck ROM: Full    Dental  (+) Chipped, Partial Upper   Pulmonary shortness of breath, COPD,  COPD inhaler, Patient abstained from smoking., former smoker,  Persistent air leak    Pulmonary exam normal breath sounds clear to auscultation       Cardiovascular hypertension, Pt. on medications (-) angina+ Peripheral Vascular Disease  (-) CAD and (-) Past MI  Rhythm:Regular Rate:Tachycardia     Neuro/Psych  Headaches,  Neuromuscular disease negative psych ROS   GI/Hepatic Neg liver ROS, GERD  ,  Endo/Other  negative endocrine ROS  Renal/GU negative Renal ROS     Musculoskeletal  (+) Arthritis , Left frozen shoulder    Abdominal   Peds  Hematology negative hematology ROS (+)   Anesthesia Other Findings   Reproductive/Obstetrics                           Anesthesia Physical Anesthesia Plan  ASA: III  Anesthesia Plan: General   Post-op Pain Management:    Induction: Intravenous  PONV Risk Score and Plan: 3 and Dexamethasone and Ondansetron  Airway Management Planned: Double Lumen EBT  Additional Equipment: Arterial line, CVP and Ultrasound Guidance Line Placement  Intra-op Plan:   Post-operative Plan: Possible Post-op intubation/ventilation  Informed Consent: I have reviewed the patients History and Physical, chart, labs and discussed the procedure including the risks, benefits and alternatives for the proposed anesthesia with the patient or authorized representative who has indicated his/her understanding and acceptance.     Dental advisory given  Plan Discussed with: CRNA  Anesthesia Plan Comments:        Anesthesia Quick Evaluation

## 2019-07-01 NOTE — Progress Notes (Signed)
The proposed treatment discussed in cancer conference 07/01/19 is for discussion purpose only and is not a binding recommendation.  The patient was not physically examined nor present for their treatment options.  Therefore, final treatment plans cannot be decided.    

## 2019-07-01 NOTE — Anesthesia Procedure Notes (Addendum)
Procedure Name: Intubation Date/Time: 07/01/2019 11:18 AM Performed by: Kyung Rudd, CRNA Pre-anesthesia Checklist: Patient identified, Emergency Drugs available, Suction available and Patient being monitored Patient Re-evaluated:Patient Re-evaluated prior to induction Oxygen Delivery Method: Circle system utilized Preoxygenation: Pre-oxygenation with 100% oxygen Induction Type: IV induction Ventilation: Two handed mask ventilation required Laryngoscope Size: Mac and 4 Grade View: Grade I Tube type: Oral Endobronchial tube: Left and Double lumen EBT and 39 Fr Number of attempts: 1 Airway Equipment and Method: Stylet Placement Confirmation: ETT inserted through vocal cords under direct vision,  positive ETCO2 and breath sounds checked- equal and bilateral Tube secured with: Tape Dental Injury: Teeth and Oropharynx as per pre-operative assessment  Comments: AOI by Collene Mares, SRNA with Dr. Gifford Shave supervising.

## 2019-07-01 NOTE — Plan of Care (Signed)

## 2019-07-01 NOTE — Brief Op Note (Signed)
07/01/2019  1:19 PM  PATIENT:  Luis [name]  69 y.o. male  PRE-OPERATIVE DIAGNOSIS:  PERSISTANT AIR LEAK  POST-OPERATIVE DIAGNOSIS:  PERSISTANT AIR LEAK  PROCEDURE: RIGHT VIDEO ASSISTED THORACOSCOPY, RE EXPLORATION, STAPLING of AIR LEAKS RML, INTERCOSTAL NERVE BLOCK   SURGEON:  Surgeon(s) and Role:    [name], MD - Primary  PHYSICIAN ASSISTANT: Lars Pinks PA-C  ANESTHESIA:   general  EBL:  Minimal;per anesthesia record  BLOOD ADMINISTERED:none  DRAINS: 5 Frence Blake drain and pigtail chest tube placed in the right plerual space   LOCAL MEDICATIONS USED:  OTHER Exparel  SPECIMEN:  Source of Specimen:  Small portion of RML  DISPOSITION OF SPECIMEN:  PATHOLOGY  COUNTS CORRECT:  YES  DICTATION: .Dragon Dictation  PLAN OF CARE: Admit to inpatient   PATIENT DISPOSITION:  PACU - hemodynamically stable.   Delay start of Pharmacological VTE agent (>24hrs) due to surgical blood loss or risk of bleeding: no

## 2019-07-01 NOTE — Anesthesia Postprocedure Evaluation (Signed)
Anesthesia Post Note  Patient: Luis [name]  Procedure(s) Performed: VIDEO ASSISTED THORACOSCOPY - REMOVAL OF RIGHT MIDDLE LOBE BLEBS (Right Chest) CHEST TUBE INSERTION (Right Chest) INTERCOSTAL NERVE BLOCK (Right Chest)     Patient location during evaluation: PACU Anesthesia Type: General Level of consciousness: awake and alert, awake and oriented Pain management: pain level controlled Vital Signs Assessment: post-procedure vital signs reviewed and stable Respiratory status: spontaneous breathing, nonlabored ventilation and respiratory function stable Cardiovascular status: blood pressure returned to baseline and stable Postop Assessment: no apparent nausea or vomiting Anesthetic complications: no   No complications documented.  Last Vitals:  Vitals:   07/01/19 1410 07/01/19 1434  BP: 139/76 132/87  Pulse: 91 (!) 103  Resp: 11 20  Temp: 36.4 C (!) 35.9 C  SpO2: 97% 94%    Last Pain:  Vitals:   07/01/19 1434  TempSrc: Oral  PainSc: 2                  Catalina Gravel

## 2019-07-01 NOTE — Transfer of Care (Signed)
Immediate Anesthesia Transfer of Care Note  Patient: COBURN KNAUS  Procedure(s) Performed: VIDEO ASSISTED THORACOSCOPY - REMOVAL OF RIGHT MIDDLE LOBE BLEBS (Right Chest) CHEST TUBE INSERTION (Right Chest) INTERCOSTAL NERVE BLOCK (Right Chest)  Patient Location: PACU  Anesthesia Type:General  Level of Consciousness: drowsy  Airway & Oxygen Therapy: Patient Spontanous Breathing and Patient connected to face mask oxygen  Post-op Assessment: Report given to RN, Post -op Vital signs reviewed and stable and Patient moving all extremities X 4  Post vital signs: Reviewed and stable  Last Vitals:  Vitals Value Taken Time  BP 111/92 07/01/19 1339  Temp    Pulse 96 07/01/19 1344  Resp 18 07/01/19 1344  SpO2 98 % 07/01/19 1344  Vitals shown include unvalidated device data.  Last Pain:  Vitals:   07/01/19 0722  TempSrc:   PainSc: 10-Worst pain ever      Patients Stated Pain Goal: 2 (88/64/84 7207)  Complications: No complications documented.

## 2019-07-01 NOTE — Progress Notes (Signed)
RN was told in report that CVTS changed chest tube suction to -20 from -10. The order in the chart was not changed and is for -10 suction. RN left suction on -20 from report given.

## 2019-07-02 ENCOUNTER — Inpatient Hospital Stay (HOSPITAL_COMMUNITY): Payer: Medicare Other

## 2019-07-02 LAB — CBC
HCT: 36.7 % — ABNORMAL LOW (ref 39.0–52.0)
Hemoglobin: 12.6 g/dL — ABNORMAL LOW (ref 13.0–17.0)
MCH: 31 pg (ref 26.0–34.0)
MCHC: 34.3 g/dL (ref 30.0–36.0)
MCV: 90.2 fL (ref 80.0–100.0)
Platelets: 434 10*3/uL — ABNORMAL HIGH (ref 150–400)
RBC: 4.07 MIL/uL — ABNORMAL LOW (ref 4.22–5.81)
RDW: 12.3 % (ref 11.5–15.5)
WBC: 11 10*3/uL — ABNORMAL HIGH (ref 4.0–10.5)
nRBC: 0 % (ref 0.0–0.2)

## 2019-07-02 LAB — BASIC METABOLIC PANEL
Anion gap: 7 (ref 5–15)
BUN: 20 mg/dL (ref 8–23)
CO2: 27 mmol/L (ref 22–32)
Calcium: 7.8 mg/dL — ABNORMAL LOW (ref 8.9–10.3)
Chloride: 100 mmol/L (ref 98–111)
Creatinine, Ser: 0.99 mg/dL (ref 0.61–1.24)
GFR calc Af Amer: >60 mL/min (ref >60)
GFR calc non Af Amer: >60 mL/min (ref >60)
Glucose, Bld: 112 mg/dL — ABNORMAL HIGH (ref 70–99)
Potassium: 5.3 mmol/L — ABNORMAL HIGH (ref 3.5–5.1)
Sodium: 134 mmol/L — ABNORMAL LOW (ref 135–145)

## 2019-07-02 LAB — GLUCOSE, CAPILLARY
Glucose-Capillary: 121 mg/dL — ABNORMAL HIGH (ref 70–99)
Glucose-Capillary: 131 mg/dL — ABNORMAL HIGH (ref 70–99)

## 2019-07-02 LAB — SURGICAL PATHOLOGY

## 2019-07-02 MED ORDER — SODIUM CHLORIDE 0.9 % IV BOLUS
500.0000 mL | Freq: Once | INTRAVENOUS | Status: AC
  Administered 2019-07-02: 500 mL via INTRAVENOUS

## 2019-07-02 MED ORDER — SODIUM CHLORIDE 0.9 % IV SOLN: INTRAVENOUS | Status: DC

## 2019-07-02 MED ORDER — FUROSEMIDE 10 MG/ML IJ SOLN: 20.0000 mg | Freq: Two times a day (BID) | INTRAMUSCULAR | Status: DC

## 2019-07-02 NOTE — Progress Notes (Signed)
Pt did not produce urine on 6/17 to 6/18 - MD and PA notified the AM of 6/18 - and 500 bolus given and produced 425 urine. He had decrease of urine again. MD notified the afternoon of 6/18 and started back on NS 100, he is producing urine, but small amount.

## 2019-07-02 NOTE — Progress Notes (Signed)
Chest tube, posterior tube, pulled per order, Pt tolerated procedure well, no change in vs or desaturation in oxygen level.  Pigtail remains, per order, to water seal.  Pt instructed to use his incentive spirometer.

## 2019-07-02 NOTE — Evaluation (Signed)
Physical Therapy Evaluation Patient Details Name: Luis [name] MRN: [id number] DOB: December 06, 1950 Today's Date: 07/02/2019   History of Present Illness  69 yo with persistent air leak  after RUL lobectomy on 6/3 s/p bronchoscopy with interbronchial valve 6/11 and 6/17 VATS with removal of RML blebs with chest tube placement. PMhx: COPD, arthritis, RLS  Clinical Impression  Pt pleasant and reports feeling more comfortable in supine. Pt able to transition to standing but only able to tolerate a few feet of gait limited by SOB and drop in SpO2 to 77% on 6L with rise to 85% sitting and back to 100% with increased time and transition to supine. Pt with decreased activity tolerance and function who will benefit from acute therapy to maximize mobility, function and independence.    Follow Up Recommendations Home health PT    Equipment Recommendations  3in1 (PT)    Recommendations for Other Services OT consult     Precautions / Restrictions Precautions Precautions: Other (comment) Precaution Comments: watch sats, chest tube      Mobility  Bed Mobility Overal bed mobility: Needs Assistance Bed Mobility: Supine to Sit;Sit to Supine     Supine to sit: Min guard Sit to supine: Min guard   General bed mobility comments: guarding for lines/chest tube  Transfers Overall transfer level: Needs assistance   Transfers: Sit to/from Stand Sit to Stand: Min guard         General transfer comment: guarding for lines/tubes  Ambulation/Gait Ambulation/Gait assistance: Min guard Gait Distance (Feet): 4 Feet Assistive device: Rolling walker (2 wheeled) Gait Pattern/deviations: Step-through pattern;Decreased stride length;Trunk flexed   Gait velocity interpretation: <1.31 ft/sec, indicative of household ambulator General Gait Details: pt only able to walk 2' forward then back stating unable to catch his breath. pt with SpO2 100% on 3L initially with drop to 77% with good pleth and rise to  6L with increased time in sitting to achieve 85% with return to supine at 6L return to 100%  Stairs            Wheelchair Mobility    Modified Rankin (Stroke Patients Only)       Balance Overall balance assessment: No apparent balance deficits (not formally assessed)                                           Pertinent Vitals/Pain Pain Assessment: 0-10 Pain Score: 4  Pain Location: right chest with movement Pain Descriptors / Indicators: Aching;Guarding Pain Intervention(s): Limited activity within patient's tolerance;Repositioned    Home Living Family/patient expects to be discharged to:: Private residence Living Arrangements: Spouse/significant other Available Help at Discharge: Family;Available 24 hours/day Type of Home: House Home Access: Stairs to enter Entrance Stairs-Rails: Right;Left;Can reach both Entrance Stairs-Number of Steps: 3 Home Layout: One level Home Equipment: Walker - 2 wheels      Prior Function Level of Independence: Independent               Hand Dominance        Extremity/Trunk Assessment   Upper Extremity Assessment Upper Extremity Assessment: Overall WFL for tasks assessed    Lower Extremity Assessment Lower Extremity Assessment: Overall WFL for tasks assessed    Cervical / Trunk Assessment Cervical / Trunk Assessment: Normal;Other exceptions Cervical / Trunk Exceptions: guarding trunk due to tubes and pain  Communication   Communication: No difficulties  Cognition Arousal/Alertness:  Awake/alert Behavior During Therapy: WFL for tasks assessed/performed Overall Cognitive Status: Within Functional Limits for tasks assessed                                        General Comments      Exercises     Assessment/Plan    PT Assessment Patient needs continued PT services  PT Problem List Decreased mobility;Decreased activity tolerance;Cardiopulmonary status limiting activity;Decreased  balance       PT Treatment Interventions DME instruction;Therapeutic exercise;Gait training;Functional mobility training;Therapeutic activities;Patient/family education    PT Goals (Current goals can be found in the Care Plan section)  Acute Rehab PT Goals Patient Stated Goal: be able to walk and breathe PT Goal Formulation: With patient Time For Goal Achievement: 07/16/19 Potential to Achieve Goals: Fair    Frequency Min 3X/week   Barriers to discharge        Co-evaluation               AM-PAC PT "6 Clicks" Mobility  Outcome Measure Help needed turning from your back to your side while in a flat bed without using bedrails?: A Little Help needed moving from lying on your back to sitting on the side of a flat bed without using bedrails?: A Little Help needed moving to and from a bed to a chair (including a wheelchair)?: A Little Help needed standing up from a chair using your arms (e.g., wheelchair or bedside chair)?: A Little Help needed to walk in hospital room?: A Lot Help needed climbing 3-5 steps with a railing? : A Lot 6 Click Score: 16    End of Session   Activity Tolerance: Patient limited by fatigue Patient left: in bed;with call bell/phone within reach;with nursing/sitter in room Nurse Communication: Mobility status PT Visit Diagnosis: Other abnormalities of gait and mobility (R26.89);Difficulty in walking, not elsewhere classified (R26.2)    Time: 8676-7209 PT Time Calculation (min) (ACUTE ONLY): 27 min   Charges:   PT Evaluation $PT Eval Moderate Complexity: 1 Mod          [place], PT Acute Rehabilitation Services Pager: [phone] Office: [phone]   Kamber Vignola B Amritpal Shropshire 07/02/2019, 1:01 PM

## 2019-07-02 NOTE — Progress Notes (Signed)
Pt had not produced urine during the night per the night RN, notified PA.  Received orders for 500 bolus NS - started bolus and approximately half way through the bolus, Pt produced approximately 150-200 ml of urine in his foley catheter. Will continue to monitor Pt for urine output

## 2019-07-02 NOTE — Progress Notes (Signed)
CRITICAL VALUE ALERT  Critical Value:  Radiology - 25% of pneumothorax on right side of chest on CXR  Date & Time Notied:  07/02/2019 9:30  Provider Notified: Barry Brunner and Dr. Blase Mess  Orders Received/Actions taken: No action taken. To continue with orders to remove chest tube and remain on water seal.  Dr. Roxan Hockey had already reviewed the CRX.

## 2019-07-02 NOTE — Progress Notes (Addendum)
      [address]       [place] [address]             [phone]       1 Day Post-Op Procedure(s) (LRB): VIDEO ASSISTED THORACOSCOPY - REMOVAL OF RIGHT MIDDLE LOBE BLEBS (Right) CHEST TUBE INSERTION (Right) INTERCOSTAL NERVE BLOCK (Right)  Subjective: Patient states breathing is ok. He feels pressure to urinate but states he has nurse told him no urine output overnight.  Objective: Vital signs in last 24 hours: Temp:  [96.7 F (35.9 C)-98.6 F (37 C)] 98.6 F (37 C) (06/18 0523) Pulse Rate:  [86-108] 87 (06/18 0523) Cardiac Rhythm: Normal sinus rhythm (06/18 0715) Resp:  [11-20] 15 (06/18 0523) BP: (111-139)/(65-92) 129/74 (06/18 0523) SpO2:  [94 %-98 %] 98 % (06/18 0523)     Intake/Output from previous day: 06/17 0701 - 06/18 0700 In: 3278.9 [I.V.:2878.9; IV Piggyback:400] Out: 410 [Urine:175; Blood:25; Chest Tube:210]   Physical Exam:  Cardiovascular: RRR Pulmonary: Diminished bilaterally, subcutaneous emphysema bilater neck, arms, and chest  Abdomen: Soft, non tender, bowel sounds present. Extremities: No lower extremity edema. Wounds: Dressing is clean and dry  Chest Tube: on water seal,  no air leak   Lab Results: CBC: Recent Labs    07/02/19 0255  WBC 11.0*  HGB 12.6*  HCT 36.7*  PLT 434*   BMET:  Recent Labs    07/02/19 0255  NA 134*  K 5.3*  CL 100  CO2 27  GLUCOSE 112*  BUN 20  CREATININE 0.99  CALCIUM 7.8*    PT/INR:  Recent Labs    07/01/19 0215  LABPROT 13.2  INR 1.0   ABG:  INR: Will add last result for INR, ABG once components are confirmed Will add last 4 CBG results once components are confirmed  Assessment/Plan:  1. CV - ST at times. Will likely restart Irbesartan 75 mg daily 2.  Pulmonary - History of COPD. On 2 liters of oxygen via [place]. Chest tube with 210 last 24 hours. Chest tube is to water seal and there is no air leak. CXR this am appears to show right pneumothorax (apical and basilar) and  subcutaneous emphysema.  Will remove Blake drain. Encourage incentive spirometer. Xopenex PRN, Mucinex., Tiotropium every am Bromide-Olodaterol inhaler to give daily am starting 06/06.  3. Anemia-H and H this am slightly decreased to 12.6 and 36.7 4. Bladder scan showed no residual urine in bladder. Will give IVF bolus and continue low dose fluids this am. Monitor 5. PT to assist with ambulation  Donielle M ZimmermanPA-C 07/02/2019,7:27 AM [phone]  Patient seen and examined, agree with above He has some air rattling in tubing but no leak wit cough or when suction placed to tubing. Still has a lot of volume loss and a space on right side. Will keep pigtail to water seal UO has been sluggish. Normal creatinine. Will give normal saline at 100 ml/hr overnight.  Revonda Standard Roxan Hockey, MD [facility] [phone]

## 2019-07-02 NOTE — Plan of Care (Signed)

## 2019-07-03 ENCOUNTER — Inpatient Hospital Stay (HOSPITAL_COMMUNITY): Payer: Medicare Other

## 2019-07-03 LAB — COMPREHENSIVE METABOLIC PANEL
ALT: 14 U/L (ref 0–44)
AST: 12 U/L — ABNORMAL LOW (ref 15–41)
Albumin: 2.1 g/dL — ABNORMAL LOW (ref 3.5–5.0)
Alkaline Phosphatase: 41 U/L (ref 38–126)
Anion gap: 4 — ABNORMAL LOW (ref 5–15)
BUN: 17 mg/dL (ref 8–23)
CO2: 26 mmol/L (ref 22–32)
Calcium: 7.4 mg/dL — ABNORMAL LOW (ref 8.9–10.3)
Chloride: 105 mmol/L (ref 98–111)
Creatinine, Ser: 0.9 mg/dL (ref 0.61–1.24)
GFR calc Af Amer: >60 mL/min (ref >60)
GFR calc non Af Amer: >60 mL/min (ref >60)
Glucose, Bld: 102 mg/dL — ABNORMAL HIGH (ref 70–99)
Potassium: 4.5 mmol/L (ref 3.5–5.1)
Sodium: 135 mmol/L (ref 135–145)
Total Bilirubin: 0.6 mg/dL (ref 0.3–1.2)
Total Protein: 4.2 g/dL — ABNORMAL LOW (ref 6.5–8.1)

## 2019-07-03 LAB — CBC
HCT: 36 % — ABNORMAL LOW (ref 39.0–52.0)
Hemoglobin: 12.2 g/dL — ABNORMAL LOW (ref 13.0–17.0)
MCH: 31 pg (ref 26.0–34.0)
MCHC: 33.9 g/dL (ref 30.0–36.0)
MCV: 91.4 fL (ref 80.0–100.0)
Platelets: 409 10*3/uL — ABNORMAL HIGH (ref 150–400)
RBC: 3.94 MIL/uL — ABNORMAL LOW (ref 4.22–5.81)
RDW: 12.6 % (ref 11.5–15.5)
WBC: 12.7 10*3/uL — ABNORMAL HIGH (ref 4.0–10.5)
nRBC: 0 % (ref 0.0–0.2)

## 2019-07-03 MED ORDER — FUROSEMIDE 10 MG/ML IJ SOLN
40.0000 mg | Freq: Once | INTRAMUSCULAR | Status: AC
  Administered 2019-07-03: 40 mg via INTRAVENOUS
  Filled 2019-07-03: qty 4

## 2019-07-03 MED ORDER — FLEET ENEMA 7-19 GM/118ML RE ENEM
1.0000 | ENEMA | Freq: Every day | RECTAL | Status: DC | PRN
  Filled 2019-07-03: qty 1

## 2019-07-03 MED ORDER — LORAZEPAM 0.5 MG PO TABS
0.5000 mg | ORAL_TABLET | Freq: Three times a day (TID) | ORAL | Status: DC | PRN
  Administered 2019-07-03 – 2019-07-06 (×7): 0.5 mg via ORAL
  Filled 2019-07-03 (×7): qty 1

## 2019-07-03 NOTE — Progress Notes (Addendum)
[address]       [place] [address]             [phone]      2 Days Post-Op Procedure(s) (LRB): VIDEO ASSISTED THORACOSCOPY - REMOVAL OF RIGHT MIDDLE LOBE BLEBS (Right) CHEST TUBE INSERTION (Right) INTERCOSTAL NERVE BLOCK (Right) Subjective: Biggest issue is that he can't breathe well this morning.   Objective: Vital signs in last 24 hours: Temp:  [97.7 F (36.5 C)-98.7 F (37.1 C)] 98.7 F (37.1 C) (06/19 0752) Pulse Rate:  [89-109] 92 (06/19 0752) Cardiac Rhythm: Normal sinus rhythm (06/19 0752) Resp:  [15-24] 15 (06/19 0752) BP: (114-131)/(73-90) 128/77 (06/19 0752) SpO2:  [95 %-99 %] 98 % (06/19 0752)     Intake/Output from previous day: 06/18 0701 - 06/19 0700 In: 1823.6 [I.V.:1823.6] Out: 1075 [Urine:775; Chest Tube:300] Intake/Output this shift: Total I/O In: 120 [P.O.:120] Out: 280 [Urine:100; Chest Tube:180]  General appearance: alert, cooperative and no distress Heart: regular rate and rhythm, S1, S2 normal, no murmur, click, rub or gallop Lungs: clear to auscultation bilaterally and diminished in the lower lobe Abdomen: soft, non-tender; bowel sounds normal; no masses,  no organomegaly Extremities: extremities normal, atraumatic, no cyanosis or edema Wound: clean and dry  Lab Results: Recent Labs    07/02/19 0255 07/03/19 0305  WBC 11.0* 12.7*  HGB 12.6* 12.2*  HCT 36.7* 36.0*  PLT 434* 409*   BMET:  Recent Labs    07/02/19 0255 07/03/19 0305  NA 134* 135  K 5.3* 4.5  CL 100 105  CO2 27 26  GLUCOSE 112* 102*  BUN 20 17  CREATININE 0.99 0.90  CALCIUM 7.8* 7.4*    PT/INR:  Recent Labs    07/01/19 0215  LABPROT 13.2  INR 1.0   ABG    Component Value Date/Time   PHART 7.432 07/01/2019 0500   HCO3 27.0 07/01/2019 0500   TCO2 28 06/17/2019 1225   ACIDBASEDEF 3.0 (H) 06/17/2019 1225   O2SAT 94.4 07/01/2019 0500   CBG (last 3)  Recent Labs    07/01/19 1803 07/02/19 0005 07/02/19 0525  GLUCAP  126* 131* 121*    Assessment/Plan: S/P Procedure(s) (LRB): VIDEO ASSISTED THORACOSCOPY - REMOVAL OF RIGHT MIDDLE LOBE BLEBS (Right) CHEST TUBE INSERTION (Right) INTERCOSTAL NERVE BLOCK (Right)   1. CV - ST at times. BP well controlled.   2.  Pulmonary - History of COPD. On 2 liters of oxygen via [place]. Chest tube with 180 last 24 hours. Chest tube is to water seal and there is no air leak. CXR this am appears to show right pneumothorax (apical and basilar) and subcutaneous emphysema. Blake removed yesterday. Encourage incentive spirometer. Xopenex PRN, Mucinex., Tiotropium every am Bromide-Olodaterol inhaler to give daily. 3. Anemia-H and H this am slightly decreased to 12.2 and 36.0 4. Bladder scan showed no residual urine in bladder. Will give IVF bolus and continue 150ml/hr normal saline 5. PT to assist with ambulation  Plan: Will cut fluid back to 6ml/hour. Will give 40mg  lasix IV x 1. Needs pulm toilet with mucinex, flutter valve, incentive spirometer. Keep pigtail catheter.    LOS: 16 days    Elgie Collard 07/03/2019   Chart reviewed, patient examined, agree with above. CXR looks stable with no ptx. There is persistent opacification of right mid to lower lung fields which I suspect is atelectasis. He has not been using IS or flutter valve much.  No air leak from pigtail so I  turned it off to patient. Will keep it in and follow up CXR tomorrow.

## 2019-07-03 NOTE — Progress Notes (Signed)
Pt complained pain throughout the day, covered with pain meds Not motivated to get up and walk at all, constantly complaining SOB on moving, oxygen 2l via [place], Saturation maintained at 98. Pt's output increase after lasix IV, chest tube site is CDI, dressing changed, Pig tail catheter is turned off by Dr Cyndia Bent. Pt's wife is in bed side and updated.

## 2019-07-04 ENCOUNTER — Inpatient Hospital Stay (HOSPITAL_COMMUNITY): Payer: Medicare Other

## 2019-07-04 NOTE — Progress Notes (Addendum)
      [address]       [place] [address]             [phone]      3 Days Post-Op Procedure(s) (LRB): VIDEO ASSISTED THORACOSCOPY - REMOVAL OF RIGHT MIDDLE LOBE BLEBS (Right) CHEST TUBE INSERTION (Right) INTERCOSTAL NERVE BLOCK (Right) Subjective: Feels better today and on room air with 96% oxygen saturation. He states he is breathing much better today.   Objective: Vital signs in last 24 hours: Temp:  [98.1 F (36.7 C)-98.7 F (37.1 C)] 98.1 F (36.7 C) (06/20 0730) Pulse Rate:  [77-100] 80 (06/20 0730) Cardiac Rhythm: Normal sinus rhythm (06/20 0730) Resp:  [16-21] 18 (06/20 0730) BP: (114-147)/(54-84) 142/77 (06/20 0730) SpO2:  [91 %-97 %] 91 % (06/20 0730)     Intake/Output from previous day: 06/19 0701 - 06/20 0700 In: 1276 [P.O.:720; I.V.:556] Out: 3231 [Urine:2800; Stool:1; Chest Tube:430] Intake/Output this shift: Total I/O In: 240 [P.O.:240] Out: 100 [Urine:100]  General appearance: alert, cooperative and no distress Heart: regular rate and rhythm, S1, S2 normal, no murmur, click, rub or gallop Lungs: + rhonchi in all fields Abdomen: soft, non-tender; bowel sounds normal; no masses,  no organomegaly Extremities: extremities normal, atraumatic, no cyanosis or edema Wound: clean and dry  Lab Results: Recent Labs    07/02/19 0255 07/03/19 0305  WBC 11.0* 12.7*  HGB 12.6* 12.2*  HCT 36.7* 36.0*  PLT 434* 409*   BMET:  Recent Labs    07/02/19 0255 07/03/19 0305  NA 134* 135  K 5.3* 4.5  CL 100 105  CO2 27 26  GLUCOSE 112* 102*  BUN 20 17  CREATININE 0.99 0.90  CALCIUM 7.8* 7.4*    PT/INR: No results for input(s): LABPROT, INR in the last 72 hours. ABG    Component Value Date/Time   PHART 7.432 07/01/2019 0500   HCO3 27.0 07/01/2019 0500   TCO2 28 06/17/2019 1225   ACIDBASEDEF 3.0 (H) 06/17/2019 1225   O2SAT 94.4 07/01/2019 0500   CBG (last 3)  Recent Labs    07/01/19 1803 07/02/19 0005 07/02/19 0525    GLUCAP 126* 131* 121*    Assessment/Plan: S/P Procedure(s) (LRB): VIDEO ASSISTED THORACOSCOPY - REMOVAL OF RIGHT MIDDLE LOBE BLEBS (Right) CHEST TUBE INSERTION (Right) INTERCOSTAL NERVE BLOCK (Right)  1. CV - NSR in the 80s.BP well controlled.  2. Pulmonary - History of COPD. On 2 liters of oxygen via [place]. Chest tube with 450ml last 24 hours. Chest tube is to water seal and there isno airleak. CXR this am shows improved right opacity. Encourage incentive spirometer. Xopenex PRN, Mucinex., Tiotropium every am Bromide-Olodaterol inhaler to give daily. 3. Anemia-H and H 12.2 and 36.0, stable 4. Great urine output yesterday. Lasix 40mg  x 1 IV and cutting back on fluids to 50cc/hour.  5. PT to assist with ambulation  Plan: Discontinue foley catheter. Discontinue fluids-patient is able to drink on his own. Discontinue central line and place peripheral. Encouraged to ambulate. Keep pigtail in the off position until tomorrow. Might be able to remove the tube tomorrow if CXR stable.    LOS: 17 days    Luis [name] 07/04/2019   Chart reviewed, patient examined, agree with above. CXR shows stable aeration of right lung and no ptx with pigtail clamped for 24 hrs. Will keep it clamped and repeat CXR in am. Hopefully will be able to remove tomorrow. He feels much better today.

## 2019-07-05 ENCOUNTER — Inpatient Hospital Stay (HOSPITAL_COMMUNITY): Payer: Medicare Other

## 2019-07-05 MED ORDER — GUAIFENESIN ER 600 MG PO TB12: 600.0000 mg | ORAL_TABLET | Freq: Two times a day (BID) | ORAL | Status: DC | PRN

## 2019-07-05 MED ORDER — IRBESARTAN 150 MG PO TABS
75.0000 mg | ORAL_TABLET | Freq: Every day | ORAL | Status: DC
  Administered 2019-07-05 – 2019-07-06 (×2): 75 mg via ORAL
  Filled 2019-07-05 (×2): qty 1

## 2019-07-05 MED ORDER — HYDROCHLOROTHIAZIDE 12.5 MG PO CAPS
12.5000 mg | ORAL_CAPSULE | Freq: Every day | ORAL | Status: DC
  Administered 2019-07-05 – 2019-07-06 (×2): 12.5 mg via ORAL
  Filled 2019-07-05 (×2): qty 1

## 2019-07-05 NOTE — Progress Notes (Signed)
Desats to 80's on room air. Placed on o2 2l [place]  With pulse ox- 95 %

## 2019-07-05 NOTE — Plan of Care (Signed)
  Problem: Education: Goal: Knowledge of General Education information will improve Description: Including pain rating scale, medication(s)/side effects and non-pharmacologic comfort measures Outcome: Progressing   Problem: Clinical Measurements: Goal: Ability to maintain clinical measurements within normal limits will improve Outcome: Progressing Goal: Will remain free from infection Outcome: Progressing   Problem: Activity: Goal: Risk for activity intolerance will decrease Outcome: Progressing   Problem: Nutrition: Goal: Adequate nutrition will be maintained Outcome: Progressing   Problem: Pain Managment: Goal: General experience of comfort will improve Outcome: Progressing   Problem: Safety: Goal: Ability to remain free from injury will improve Outcome: Progressing

## 2019-07-05 NOTE — Plan of Care (Signed)

## 2019-07-05 NOTE — TOC Initial Note (Addendum)
Transition of Care [facility]) - Initial/Assessment Note    Patient Details  Name: Luis [name] MRN: [id number] Date of Birth: December 16, 1950  [facility] CM/SW Contact:    Bartholomew Crews, RN Phone Number: [phone] 07/05/2019, 4:13 PM  Clinical Narrative:                  Spoke with patient at the bedside to discuss transition plans. PTA home with spouse who is a retired Therapist, sports. No DME or [place] services at home.   Discussed recommendations for [facility] PT - patient agreeable. [place] referral accepted by [employer]. Patient will need home health orders for PT with Face to Face at discharge.    Patient declines 3N1 at this time.   TOC following for transition needs.   Expected Discharge Plan: [place] Barriers to Discharge: Continued Medical Work up   Patient Goals and CMS Choice   CMS Medicare.gov Compare Post Acute Care list provided to:: Patient Choice offered to / list presented to : Patient  Expected Discharge Plan and Services Expected Discharge Plan: [place] In-house Referral: [facility] Discharge Planning Services: CM Consult Post Acute Care Choice: Home Health, Durable Medical Equipment Living arrangements for the past 2 months: Single Family Home                   DME Agency: NA       HH Arranged: PT          Prior Living Arrangements/Services Living arrangements for the past 2 months: Single Family Home Lives with:: Self, Spouse Patient language and need for interpreter reviewed:: Yes Do you feel safe going back to the place where you live?: Yes      Need for Family Participation in Patient Care: Yes (Comment) Care giver support system in place?: Yes (comment)   Criminal Activity/Legal Involvement Pertinent to Current Situation/Hospitalization: No - Comment as needed  Activities of Daily Living Home Assistive Devices/Equipment: Dentures (specify type) ADL Screening (condition at time of admission) Patient's  cognitive ability adequate to safely complete daily activities?: Yes Is the patient deaf or have difficulty hearing?: Yes Does the patient have difficulty seeing, even when wearing glasses/contacts?: No Does the patient have difficulty concentrating, remembering, or making decisions?: No Patient able to express need for assistance with ADLs?: Yes Does the patient have difficulty dressing or bathing?: No Independently performs ADLs?: Yes (appropriate for developmental age) Does the patient have difficulty walking or climbing stairs?: No Weakness of Legs: None Weakness of Arms/Hands: None  Permission Sought/Granted                  Emotional Assessment Appearance:: Appears stated age Attitude/Demeanor/Rapport: Engaged Affect (typically observed): Accepting Orientation: : Oriented to Self, Oriented to  Time, Oriented to Place, Oriented to Situation Alcohol / Substance Use: Not Applicable Psych Involvement: No (comment)  Admission diagnosis:  S/P lobectomy of lung [Z90.2] Patient Active Problem List   Diagnosis Date Noted  . S/P lobectomy of lung 06/17/2019  . COPD GOLD 3 with reversibility 05/06/2019  . Solitary pulmonary nodule on lung CT 05/06/2019  . Atherosclerosis of native arteries of the extremities with intermittent claudication 07/13/2012  . Pain in limb-Bilateral thigh 07/13/2012  . Tingling-Bilateral leg 07/13/2012   PCP:  [name], MD Pharmacy:   [id number] [place], [place] [address] [place] [address] Phone: [phone] Fax: [phone]     Social  Determinants of Health (SDOH) Interventions    Readmission Risk Interventions No flowsheet data found.

## 2019-07-05 NOTE — Progress Notes (Addendum)
      [address]       [place] [address]             [phone]       4 Days Post-Op Procedure(s) (LRB): VIDEO ASSISTED THORACOSCOPY - REMOVAL OF RIGHT MIDDLE LOBE BLEBS (Right) CHEST TUBE INSERTION (Right) INTERCOSTAL NERVE BLOCK (Right)  Subjective: Patient states he had a pretty good weekend. He has no specific complaint this am.  Objective: Vital signs in last 24 hours: Temp:  [97.7 F (36.5 C)-98.7 F (37.1 C)] 97.7 F (36.5 C) (06/21 0556) Pulse Rate:  [80-101] 83 (06/21 0556) Cardiac Rhythm: Normal sinus rhythm (06/20 1928) Resp:  [16-22] 16 (06/21 0556) BP: (124-148)/(70-87) 138/87 (06/21 0556) SpO2:  [91 %-98 %] 94 % (06/21 0556)     Intake/Output from previous day: 06/20 0701 - 06/21 0700 In: 680 [P.O.:680] Out: 775 [Urine:775]   Physical Exam:  Cardiovascular: RRR Pulmonary: Subcutaneous emphysema bilater neck, arms, and chest  Abdomen: Soft, non tender, bowel sounds present. Extremities: No lower extremity edema. Wounds: Dressing is clean and dry  Chest Tube: on water seal,  no air leak   Lab Results: CBC: Recent Labs    07/03/19 0305  WBC 12.7*  HGB 12.2*  HCT 36.0*  PLT 409*   BMET:  Recent Labs    07/03/19 0305  NA 135  K 4.5  CL 105  CO2 26  GLUCOSE 102*  BUN 17  CREATININE 0.90  CALCIUM 7.4*    PT/INR:  No results for input(s): LABPROT, INR in the last 72 hours. ABG:  INR: Will add last result for INR, ABG once components are confirmed Will add last 4 CBG results once components are confirmed  Assessment/Plan:  1. CV - SR this am. Will restart Irbesartan 75 mg daily 2.  Pulmonary - History of COPD. On 1.5 liters of oxygen via . Chest tube with scant output last 24 hours. Chest tube is to water seal and there is no air leak. CXR this am appears stable.  Hope to remove pigtail. Encourage incentive spirometer. Xopenex PRN, Mucinex.,  3. Anemia-Last H and H stable at 12.2 and 36 4. Bladder scan showed  no residual urine in bladder. Will give IVF bolus and continue low dose fluids this am. Monitor 5. PT to assist with ambulation  Donielle M ZimmermanPA-C 07/05/2019,7:13 AM [phone]  Opened pigtail stopcock and no air leak with repeated cough Dc pigtail. Observe 24 hours before DC May need to go home on O2 short term  Remo Lipps C. Roxan Hockey, MD [facility] [phone]

## 2019-07-05 NOTE — Progress Notes (Signed)
Ambulated to the bathroom, no sob noted.. right chest tube site dressing soaked with pale color drainage. Dressing changed. Continue to monitor.

## 2019-07-05 NOTE — Consult Note (Signed)
   [facility] Inpatient Consult   07/05/2019  Luis [name] September 07, 1950 [id number]  [name] Patient: Medicare Next Gen   A referral request was received today from inpatient Transition of Care [TOC] RNCM for post hospital follow up and support due to length of stay and for care and disease management needs.  Plan:  Will follow for disposition and needs.  Patient's primary care provider [name], MD [facility] is listed to provide the follow up TOC.   [facility] Care Management does not replace or interfere with any services arranged by the inpatient [facility] team for post hospital needs.  For questions, please contact:  Natividad Brood, RN BSN [facility] Liaison  [phone] business mobile phone Toll free office [phone]  Fax number: [phone] [email or web address]

## 2019-07-06 ENCOUNTER — Inpatient Hospital Stay (HOSPITAL_COMMUNITY): Payer: Medicare Other

## 2019-07-06 ENCOUNTER — Other Ambulatory Visit: Payer: Self-pay

## 2019-07-06 MED ORDER — TRAMADOL HCL 50 MG PO TABS: 50.0000 mg | ORAL_TABLET | Freq: Four times a day (QID) | ORAL | 0 refills | Status: DC | PRN

## 2019-07-06 MED ORDER — LORAZEPAM 0.5 MG PO TABS: 0.5000 mg | ORAL_TABLET | Freq: Two times a day (BID) | ORAL | 0 refills | Status: DC | PRN

## 2019-07-06 NOTE — Progress Notes (Signed)
Patient HR went up to 130's while he ambulated, but no desaturation. Patient denied no SOB or dizziness. Notified PA Myron regarding this matter. Patient is okay to go home. Walker was delivered to room. Removed PIV access and received discharge instructions. Pt & wife understood it well. He took all his belongings. HS [employer]

## 2019-07-06 NOTE — Progress Notes (Addendum)
SATURATION QUALIFICATIONS: (This note is used to comply with regulatory documentation for home oxygen)  Patient Saturations on Room Air at Rest = 99%  Patient Saturations on Room Air while Ambulating = 93%  Patient Saturations of 1 Liters of oxygen while Ambulating =100%  Please briefly explain why patient needs home oxygen:  Patient walked briskly 400 feet and sats only dropped to 93%  Patient heart rate increased to 135 while walking and was sustained during that time

## 2019-07-06 NOTE — Progress Notes (Addendum)
      [address]       [place] [address]             [phone]       5 Days Post-Op Procedure(s) (LRB): VIDEO ASSISTED THORACOSCOPY - REMOVAL OF RIGHT MIDDLE LOBE BLEBS (Right) CHEST TUBE INSERTION (Right) INTERCOSTAL NERVE BLOCK (Right)  Subjective: Patient states ativan "helps him breathe better". He has no complaint this am  Objective: Vital signs in last 24 hours: Temp:  [97.7 F (36.5 C)-99.1 F (37.3 C)] 98.3 F (36.8 C) (06/22 0535) Pulse Rate:  [75-97] 84 (06/22 0535) Cardiac Rhythm: Normal sinus rhythm (06/21 1919) Resp:  [13-19] 14 (06/22 0535) BP: (133-150)/(77-89) 133/86 (06/22 0535) SpO2:  [93 %-98 %] 96 % (06/22 0535)     Intake/Output from previous day: 06/21 0701 - 06/22 0700 In: 770 [P.O.:770] Out: 1675 [Urine:1675]   Physical Exam:  Cardiovascular: RRR Pulmonary: Stable subcutaneous emphysema bilater neck, arms, and chest  Abdomen: Soft, non tender, bowel sounds present. Extremities: No lower extremity edema. Wounds: Dressing is clean and dry    Lab Results: CBC: No results for input(s): WBC, HGB, HCT, PLT in the last 72 hours. BMET:  No results for input(s): NA, K, CL, CO2, GLUCOSE, BUN, CREATININE, CALCIUM in the last 72 hours.  PT/INR:  No results for input(s): LABPROT, INR in the last 72 hours. ABG:  INR: Will add last result for INR, ABG once components are confirmed Will add last 4 CBG results once components are confirmed  Assessment/Plan:  1. CV - SR this am. On Irbesartan 75 mg daily. Will restart Divan HCTZ at discharge (as taken prior to surgery). 2.  Pulmonary - History of COPD. On 2 liters of oxygen via . Will ask nurse to document as will likely need oxygen for short time after discharge.  CXR this am appears stable.   Encourage incentive spirometer. Xopenex PRN, Mucinex. 3. Anemia-Last H and H stable at 12.2 and 36 4. PT to assist with ambulation;Home PT ordered 5. Likely discharge once CXR  officially interpreted  Sharalyn Ink [facility] 07/06/2019,7:04 AM [phone]  Patient seen and examined, agree with above Dc home today  Revonda Standard. Roxan Hockey, MD [facility] [phone]

## 2019-07-06 NOTE — Progress Notes (Signed)
Physical Therapy Treatment Patient Details Name: Luis [name] MRN: [id number] DOB: [birth year] Today's Date: 07/06/2019    History of Present Illness 69 yo with persistent air leak  after RUL lobectomy on 6/3 s/p bronchoscopy with interbronchial valve 6/11 and 6/17 VATS with removal of RML blebs with chest tube placement. PMhx: COPD, arthritis, RLS    PT Comments    Pt with significant improvement in mobility and respiratory status this session able to maintain gait on RA and perform long hall ambulation and stairs. Pt reports eager to return home and wife present during session. Pt with noted instability during gait who will benefit from RW for safety initially or assist with gait and educated for OPPT if balance does not improved with increased mobility at D/C. No further acute needs with pt encouraged to continue ambulation with staff supervision.   HR 110-130 spO2 91-96% on RA    Follow Up Recommendations  Outpatient PT     Equipment Recommendations  Rolling walker with 5" wheels    Recommendations for Other Services       Precautions / Restrictions Precautions Precautions: Fall Restrictions Weight Bearing Restrictions: No    Mobility  Bed Mobility Overal bed mobility: Modified Independent             General bed mobility comments: pt able to transition to and from bed without assist  Transfers Overall transfer level: Independent                  Ambulation/Gait Ambulation/Gait assistance: Min guard Gait Distance (Feet): 700 Feet Assistive device: None Gait Pattern/deviations: Step-through pattern;Decreased stride length   Gait velocity interpretation: >2.62 ft/sec, indicative of community ambulatory General Gait Details: pt with 3 periods of unsteady gait needing addtional stepping strategy to recover balance, UE guarding with tactile cues for safety throughout. HR up to 131 with gait with SpO2 >92% on RA with mask throughout   Stairs Stairs:  Yes Stairs assistance: Modified independent (Device/Increase time) Stair Management: One rail Right;Alternating pattern;Forwards Number of Stairs: 3 General stair comments: pt ascend/descended safely with use of rail   Wheelchair Mobility    Modified Rankin (Stroke Patients Only)       Balance Overall balance assessment: Needs assistance   Sitting balance-Leahy Scale: Good       Standing balance-Leahy Scale: Good                              Cognition Arousal/Alertness: Awake/alert Behavior During Therapy: WFL for tasks assessed/performed Overall Cognitive Status: Within Functional Limits for tasks assessed                                        Exercises      General Comments        Pertinent Vitals/Pain Pain Assessment: No/denies pain    Home Living                      Prior Function            PT Goals (current goals can now be found in the care plan section) Progress towards PT goals: Progressing toward goals    Frequency           PT Plan Discharge plan needs to be updated    Co-evaluation  AM-PAC PT "6 Clicks" Mobility   Outcome Measure  Help needed turning from your back to your side while in a flat bed without using bedrails?: None Help needed moving from lying on your back to sitting on the side of a flat bed without using bedrails?: None Help needed moving to and from a bed to a chair (including a wheelchair)?: None Help needed standing up from a chair using your arms (e.g., wheelchair or bedside chair)?: None Help needed to walk in hospital room?: A Little Help needed climbing 3-5 steps with a railing? : None 6 Click Score: 23    End of Session   Activity Tolerance: Patient tolerated treatment well Patient left: in bed;with call bell/phone within reach;with family/visitor present Nurse Communication: Mobility status PT Visit Diagnosis: Other abnormalities of gait and  mobility (R26.89)     Time: 0947-0962 PT Time Calculation (min) (ACUTE ONLY): 11 min  Charges:  $Gait Training: 8-22 mins                     Kourosh Jablonsky P, PT Acute Rehabilitation Services Pager: [phone] Office: [place] 07/06/2019, 11:15 AM

## 2019-07-06 NOTE — Care Management Important Message (Signed)
Important Message  Patient Details  Name: Luis [name] MRN: [id number] Date of Birth: 1950-07-29   Medicare Important Message Given:  Yes  Patient left prior to delivery of IM.  IM mailed to patient address.   Kaiah Hosea 07/06/2019, 2:06 PM

## 2019-07-06 NOTE — TOC Transition Note (Signed)
Transition of Care [facility]) - CM/SW Discharge Note   Patient Details  Name: Luis [name] MRN: [id number] Date of Birth: 1950-08-17  Transition of Care [facility]) CM/SW Contact:  Zenon Mayo, RN Phone Number: 07/06/2019, 10:25 AM   Clinical Narrative:    Patient is for dc today, he will need a rolling walker, NCM made referral to [facility] with [employer], the walker will be brought to patient's room prior to dc.  Also NCM notified Santiago Glad with [facility] of dc today.    Final next level of care: Home w Home Health Services Barriers to Discharge: No Barriers Identified   Patient Goals and CMS Choice   CMS Medicare.gov Compare Post Acute Care list provided to:: Patient Choice offered to / list presented to : Patient  Discharge Placement                       Discharge Plan and Services In-house Referral: [facility] Discharge Planning Services: CM Consult Post Acute Care Choice: Home Health, Durable Medical Equipment          DME Arranged: Walker rolling DME Agency: [employer] Date DME Agency Contacted: 07/06/19 Time DME Agency Contacted: 62 Representative spoke with at DME Agency: zach [place]: PT [place]: [place] ([place]) Date [place]: 07/05/19 Time [place]: [phone] Representative spoke with at [place]: [place] ([place]) Interventions     Readmission Risk Interventions No flowsheet data found.

## 2019-07-07 ENCOUNTER — Ambulatory Visit: Payer: Medicare Other | Admitting: Thoracic Surgery (Cardiothoracic Vascular Surgery)

## 2019-07-07 ENCOUNTER — Encounter: Payer: Self-pay | Admitting: Thoracic Surgery (Cardiothoracic Vascular Surgery)

## 2019-07-08 DIAGNOSIS — K219 Gastro-esophageal reflux disease without esophagitis: Secondary | ICD-10-CM | POA: Diagnosis not present

## 2019-07-08 DIAGNOSIS — J449 Chronic obstructive pulmonary disease, unspecified: Secondary | ICD-10-CM | POA: Diagnosis not present

## 2019-07-08 DIAGNOSIS — Z9181 History of falling: Secondary | ICD-10-CM | POA: Diagnosis not present

## 2019-07-08 DIAGNOSIS — Z87891 Personal history of nicotine dependence: Secondary | ICD-10-CM | POA: Diagnosis not present

## 2019-07-08 DIAGNOSIS — Z483 Aftercare following surgery for neoplasm: Secondary | ICD-10-CM | POA: Diagnosis not present

## 2019-07-08 DIAGNOSIS — Z79891 Long term (current) use of opiate analgesic: Secondary | ICD-10-CM | POA: Diagnosis not present

## 2019-07-08 DIAGNOSIS — C3411 Malignant neoplasm of upper lobe, right bronchus or lung: Secondary | ICD-10-CM | POA: Diagnosis not present

## 2019-07-08 DIAGNOSIS — I70213 Atherosclerosis of native arteries of extremities with intermittent claudication, bilateral legs: Secondary | ICD-10-CM | POA: Diagnosis not present

## 2019-07-08 DIAGNOSIS — M5416 Radiculopathy, lumbar region: Secondary | ICD-10-CM | POA: Diagnosis not present

## 2019-07-08 DIAGNOSIS — D630 Anemia in neoplastic disease: Secondary | ICD-10-CM | POA: Diagnosis not present

## 2019-07-08 DIAGNOSIS — R519 Headache, unspecified: Secondary | ICD-10-CM | POA: Diagnosis not present

## 2019-07-08 DIAGNOSIS — Z902 Acquired absence of lung [part of]: Secondary | ICD-10-CM | POA: Diagnosis not present

## 2019-07-08 DIAGNOSIS — M1990 Unspecified osteoarthritis, unspecified site: Secondary | ICD-10-CM | POA: Diagnosis not present

## 2019-07-08 DIAGNOSIS — G2581 Restless legs syndrome: Secondary | ICD-10-CM | POA: Diagnosis not present

## 2019-07-12 DIAGNOSIS — C3411 Malignant neoplasm of upper lobe, right bronchus or lung: Secondary | ICD-10-CM | POA: Diagnosis not present

## 2019-07-12 DIAGNOSIS — K219 Gastro-esophageal reflux disease without esophagitis: Secondary | ICD-10-CM | POA: Diagnosis not present

## 2019-07-12 DIAGNOSIS — J449 Chronic obstructive pulmonary disease, unspecified: Secondary | ICD-10-CM | POA: Diagnosis not present

## 2019-07-12 DIAGNOSIS — D630 Anemia in neoplastic disease: Secondary | ICD-10-CM | POA: Diagnosis not present

## 2019-07-12 DIAGNOSIS — Z483 Aftercare following surgery for neoplasm: Secondary | ICD-10-CM | POA: Diagnosis not present

## 2019-07-12 DIAGNOSIS — I70213 Atherosclerosis of native arteries of extremities with intermittent claudication, bilateral legs: Secondary | ICD-10-CM | POA: Diagnosis not present

## 2019-07-13 ENCOUNTER — Other Ambulatory Visit: Payer: Self-pay

## 2019-07-14 NOTE — Patient Outreach (Signed)
[facility]) Care Management  07/14/2019  Luis [name] February 27, 1950 [id number]   Initial outreach call:  Reviewed medical record and noted MD office does transition of care.   Placed call to patient and spoke with wife. Wife reports patient is having a hard time with anxiety over his breathing. Reports decreased appetite a well. Lost 10 pounds while in hospital.. Wife verbalizes patient does not like ensure and that it gives him diarrhea and nausea.  Reports patient does not like ativan as it makes him confused. Reports just started on Celexa.  Wife reports PT came on 07/12/2019 and patient worked with PT to improve his strength.  Wife confirms follow up appointments planned and she will provide transportation.   Plan: reviewed [facility] program with wife. Offered suggestions about adding protein powder to smoothies and milkshakes. Reviewed option of carnation instant breakfast as well.  Reviewed medications. Wife interested in program and will plan to mail out packet. Return call to wife in 10 days.  Tomasa Rand, RN, BSN, CEN [facility] [employer] [phone]

## 2019-07-15 DIAGNOSIS — J449 Chronic obstructive pulmonary disease, unspecified: Secondary | ICD-10-CM | POA: Diagnosis not present

## 2019-07-15 DIAGNOSIS — C3411 Malignant neoplasm of upper lobe, right bronchus or lung: Secondary | ICD-10-CM | POA: Diagnosis not present

## 2019-07-15 DIAGNOSIS — I70213 Atherosclerosis of native arteries of extremities with intermittent claudication, bilateral legs: Secondary | ICD-10-CM | POA: Diagnosis not present

## 2019-07-15 DIAGNOSIS — K219 Gastro-esophageal reflux disease without esophagitis: Secondary | ICD-10-CM | POA: Diagnosis not present

## 2019-07-15 DIAGNOSIS — Z483 Aftercare following surgery for neoplasm: Secondary | ICD-10-CM | POA: Diagnosis not present

## 2019-07-15 DIAGNOSIS — D630 Anemia in neoplastic disease: Secondary | ICD-10-CM | POA: Diagnosis not present

## 2019-07-16 ENCOUNTER — Other Ambulatory Visit: Payer: Self-pay | Admitting: Thoracic Surgery (Cardiothoracic Vascular Surgery)

## 2019-07-16 DIAGNOSIS — Z902 Acquired absence of lung [part of]: Secondary | ICD-10-CM

## 2019-07-18 ENCOUNTER — Other Ambulatory Visit: Payer: Self-pay | Admitting: Cardiothoracic Surgery

## 2019-07-18 MED ORDER — TEMAZEPAM 7.5 MG PO CAPS
7.5000 mg | ORAL_CAPSULE | Freq: Every evening | ORAL | 0 refills | Status: DC | PRN
Start: 2019-07-18 — End: 2019-07-27

## 2019-07-19 ENCOUNTER — Encounter: Payer: Self-pay | Admitting: Thoracic Surgery (Cardiothoracic Vascular Surgery)

## 2019-07-20 ENCOUNTER — Encounter: Payer: Self-pay | Admitting: *Deleted

## 2019-07-20 ENCOUNTER — Ambulatory Visit (INDEPENDENT_AMBULATORY_CARE_PROVIDER_SITE_OTHER): Payer: Self-pay | Admitting: Thoracic Surgery (Cardiothoracic Vascular Surgery)

## 2019-07-20 ENCOUNTER — Ambulatory Visit
Admission: RE | Admit: 2019-07-20 | Discharge: 2019-07-20 | Disposition: A | Discharge status: Home / Self Care | Payer: Medicare Other | Source: Ambulatory Visit | Attending: Thoracic Surgery (Cardiothoracic Vascular Surgery) | Admitting: Thoracic Surgery (Cardiothoracic Vascular Surgery)

## 2019-07-20 ENCOUNTER — Encounter: Payer: Self-pay | Admitting: Thoracic Surgery (Cardiothoracic Vascular Surgery)

## 2019-07-20 ENCOUNTER — Other Ambulatory Visit: Payer: Self-pay

## 2019-07-20 VITALS — BP 148/92 | HR 106 | Temp 97.5°F | Resp 20 | Ht 66.0 in | Wt 119.0 lb

## 2019-07-20 DIAGNOSIS — Z902 Acquired absence of lung [part of]: Secondary | ICD-10-CM

## 2019-07-20 DIAGNOSIS — R911 Solitary pulmonary nodule: Secondary | ICD-10-CM

## 2019-07-20 NOTE — Progress Notes (Signed)
[address]       [name]             [phone]      HPI: Luis [name] returns for scheduled follow-up visit  Luis [name] is a 69 year old gentleman with a history of tobacco abuse, COPD, chronic bronchitis, reflux, lumbar radiculopathy, and arthritis.  He was found to have a nodule on a low-dose screening CT.  On PET CT it was hypermetabolic with an SUV of 6.4.  He underwent a robotic right upper lobectomy and node dissection on 06/17/2019.  He had an air leak postoperatively.  He had placement of intrabronchial valves in the lower lobe bronchi on 06/25/2019.  That initially decreased his air leak, but then it increased again.  He was taken back to the operating room on 06/30/2009.  He was found to have a ruptured bleb in the middle lobe which was stapled.  After that his air leak resolved and he finally went home on 07/06/2019.  His primary complaint is hallucinations.  He has been having very vivid dreams at night.  He has not been sleeping well and is usually up several times during the night.  He is been taking tramadol once or twice a day.  He had taken Ativan while in the hospital, which helped at the time but seem to make things worse after discharge.  He is no longer using that.  He was given a prescription of Xanax but is not taking it because he felt like it made his breathing worse.  Dr. Inda Merlin started him on Celexa and that has helped to some degree.  He has been on that for about 4 days now.  Over the weekend Dr. Darcey Nora gave him a prescription for Restoril but that seemed to make things worse and he only took that one time.  Since discharge his appetite has been good but he has been losing weight.    Past Medical History:  Diagnosis Date  . Arthritis    through out body  . Chronic bronchitis ([place])   . Concussion   . COPD (chronic obstructive pulmonary disease) ([place])   . Dyspnea   . Emphysema lung ([place])   . Frozen shoulder    LEFT  . GERD  (gastroesophageal reflux disease)   . Hard of hearing   . Headache    alot of days, related to spine issues  . Hypertension   . Lumbar radiculopathy 2013  . Peyronie's disease   . Prostatitis 2011   Dr. Gaynelle Arabian  . RLS (restless legs syndrome)   . Shingles 06/13/2018   shingles on back, finished with prednisone, stills has scabs and pain  . Thigh pain    Bilateral Thigh  . Thigh pain 10/13   bilateral  . Tobacco abuse 2010   still uses electronic cig/ emphysema, SOB easily  . Wears partial dentures    upper    Current Outpatient Medications  Medication Sig Dispense Refill  . Cholecalciferol (VITAMIN D) 125 MCG (5000 UT) CAPS Take 5,000 Units by mouth daily.    . citalopram (CELEXA) 20 MG tablet Take 20 mg by mouth daily.    [name] guaiFENesin (MUCINEX) 600 MG 12 hr tablet Take 1 tablet (600 mg total) by mouth 2 (two) times daily as needed for cough or to loosen phlegm.    [name] LORazepam (ATIVAN) 0.5 MG tablet Take 1 tablet (0.5 mg total) by mouth 2 (two) times daily as needed for  anxiety or sleep. 28 tablet 0  . Tiotropium Bromide-Olodaterol (STIOLTO RESPIMAT) 2.5-2.5 MCG/ACT AERS Inhale 2 puffs into the lungs daily. 4 g 11  . traMADol (ULTRAM) 50 MG tablet Take 1 tablet (50 mg total) by mouth every 6 (six) hours as needed for moderate pain. 30 tablet 0  . valsartan-hydrochlorothiazide (DIOVAN-HCT) 80-12.5 MG tablet Take 1 tablet by mouth daily.    . vitamin B-12 (CYANOCOBALAMIN) 500 MCG tablet Take 500 mcg by mouth daily.    . temazepam (RESTORIL) 7.5 MG capsule Take 1 capsule (7.5 mg total) by mouth at bedtime as needed for sleep. (Patient not taking: Reported on 07/20/2019) 30 capsule 0   No current facility-administered medications for this visit.    Physical Exam BP (!) 148/92   Pulse (!) 106 Comment: regular  Temp (!) 97.5 F (36.4 C) (Temporal)   Resp 20   Ht 5\' 6"  (1.676 m)   Wt 119 lb (54 kg)   SpO2 94% Comment: RA  BMI 19.21 kg/m  Anxious 69 year old man in no acute  distress Alert and oriented x3 Lungs diminished on the right, otherwise clear Incisions healing well Cardiac mildly tachycardic, regular Peripheral edema  Diagnostic Tests: CHEST - 2 VIEW  COMPARISON:  07/06/2019  FINDINGS: Similar mild elevation of the right hemidiaphragm. There is improved right lung aeration with persistent patchy density in the right lower lung and perihilar architectural distortion. No significant pleural effusion. Rightward mediastinal shift due to volume loss. Left lung is clear apart with background emphysema. No pneumothorax. Small residual right chest wall subcutaneous emphysema. Normal heart size.  IMPRESSION: Improved right lung aeration compared to 07/06/2019 with persistent patchy density.   Electronically Signed   By: Macy Mis M.D.   On: 07/20/2019 15:06 I personally reviewed the chest x-ray images and concur with the findings noted above  Impression: Luis [name] is a 69 year old man with a history of tobacco abuse, COPD, chronic bronchitis, reflux, lumbar radiculopathy, and arthritis.  He was found to have a lung nodule on a low-dose screening CT.  That was hypermetabolic on PET.  He had a robotic right upper lobectomy and node dissection on 06/17/2019.  That was complicated by prolonged air leak requiring IBV placement ultimately reoperation.  He also had some issues with anxiety and confusion in the hospital consistent with a postoperative delirium.  His air leak resolved and he had his chest tube removed prior to discharge.  His chest x-ray today shows improved aeration in the right lung.  He does still have some dyspnea so I think we need to get the Valsalva as soon as possible.  We would typically like to leave those a least 4-6 weeks.  Tentatively plan to remove those on Friday, July 23.  His biggest issue is been ongoing anxiety, confusion, and sleep deprivation.  Medications seem to make this worse rather than better although  Celexa seems to be making some progress with that.  I encouraged him to use Tylenol on a more regular basis only use the tramadol if absolutely necessary.  He should continue the Celexa.  The Restoril seemed to have a paradoxical effect so I recommended he stop that.  He is already stopped the Ativan and Xanax.  We could try something different for sleep like trazodone or try Remeron is a antidepressant if needed but I would prefer to try to minimize his medications for now and see how he does with a little time.  I will check back on him next week  to see how he is doing with that.  He knows not to drive until his hallucinations and anxiety resolved.  Plan: Return in 1 week We will schedule consultation with Dr. Julien Nordmann in our multidisciplinary clinic Tentatively plan for bronchoscopy for IBV removal on Friday, 07/07/2019  [name], MD [facility] [phone]

## 2019-07-20 NOTE — Progress Notes (Signed)
I received referral on [name].  I updated new patient coordinator to call and schedule him to be seen on 7/20 with Cassie.

## 2019-07-21 ENCOUNTER — Other Ambulatory Visit: Payer: Self-pay | Admitting: Thoracic Surgery (Cardiothoracic Vascular Surgery)

## 2019-07-21 ENCOUNTER — Encounter: Payer: Self-pay | Admitting: *Deleted

## 2019-07-21 ENCOUNTER — Other Ambulatory Visit: Payer: Self-pay | Admitting: *Deleted

## 2019-07-21 ENCOUNTER — Telehealth: Payer: Self-pay | Admitting: Physician Assistant

## 2019-07-21 DIAGNOSIS — J9382 Other air leak: Secondary | ICD-10-CM

## 2019-07-21 DIAGNOSIS — Z902 Acquired absence of lung [part of]: Secondary | ICD-10-CM

## 2019-07-21 NOTE — Telephone Encounter (Signed)
Received a new pt referral from Dr. Roxan Hockey for dx of lung cancer. Mr Luis [name] has been scheduled to see Cassie on 7/20 at 130pm with labs at 1pm. I cld and lft the appt date and time on the pt's voicemail. Letter mailed.

## 2019-07-23 DIAGNOSIS — C3411 Malignant neoplasm of upper lobe, right bronchus or lung: Secondary | ICD-10-CM | POA: Diagnosis not present

## 2019-07-23 DIAGNOSIS — I70213 Atherosclerosis of native arteries of extremities with intermittent claudication, bilateral legs: Secondary | ICD-10-CM | POA: Diagnosis not present

## 2019-07-23 DIAGNOSIS — Z483 Aftercare following surgery for neoplasm: Secondary | ICD-10-CM | POA: Diagnosis not present

## 2019-07-23 DIAGNOSIS — K219 Gastro-esophageal reflux disease without esophagitis: Secondary | ICD-10-CM | POA: Diagnosis not present

## 2019-07-23 DIAGNOSIS — D630 Anemia in neoplastic disease: Secondary | ICD-10-CM | POA: Diagnosis not present

## 2019-07-23 DIAGNOSIS — J449 Chronic obstructive pulmonary disease, unspecified: Secondary | ICD-10-CM | POA: Diagnosis not present

## 2019-07-26 ENCOUNTER — Other Ambulatory Visit: Payer: Self-pay

## 2019-07-26 DIAGNOSIS — K219 Gastro-esophageal reflux disease without esophagitis: Secondary | ICD-10-CM | POA: Diagnosis not present

## 2019-07-26 DIAGNOSIS — D630 Anemia in neoplastic disease: Secondary | ICD-10-CM | POA: Diagnosis not present

## 2019-07-26 DIAGNOSIS — I70213 Atherosclerosis of native arteries of extremities with intermittent claudication, bilateral legs: Secondary | ICD-10-CM | POA: Diagnosis not present

## 2019-07-26 DIAGNOSIS — C3411 Malignant neoplasm of upper lobe, right bronchus or lung: Secondary | ICD-10-CM | POA: Diagnosis not present

## 2019-07-26 DIAGNOSIS — Z483 Aftercare following surgery for neoplasm: Secondary | ICD-10-CM | POA: Diagnosis not present

## 2019-07-26 DIAGNOSIS — J449 Chronic obstructive pulmonary disease, unspecified: Secondary | ICD-10-CM | POA: Diagnosis not present

## 2019-07-26 NOTE — Patient Outreach (Signed)
[facility]) Care Management  07/26/2019  Luis [name] [birth year] [id number]   Telephone assessment:  Reviewed medical record and recent MD visits.  Placed call to patient and spoke with wife, who reports patient is doing better. Reports she stopped Tramadol, ativan and xanax.   Denies fever, cough. Reports breathing is better. Patient reports that he has a hard time taking a deep breath.  Wife reports valve scheduled to be removed soon. Wife states Celexa is working and patient has decreased anxiety.  Overall, wife thinks patient is improving.  PLAN: telephone follow up in a week.  Tomasa Rand, RN, BSN, CEN [facility] [employer] [phone]

## 2019-07-27 ENCOUNTER — Encounter: Payer: Self-pay | Admitting: Thoracic Surgery (Cardiothoracic Vascular Surgery)

## 2019-07-27 ENCOUNTER — Ambulatory Visit
Admission: RE | Admit: 2019-07-27 | Discharge: 2019-07-27 | Disposition: A | Discharge status: Home / Self Care | Payer: Medicare Other | Source: Ambulatory Visit | Attending: Thoracic Surgery (Cardiothoracic Vascular Surgery) | Admitting: Thoracic Surgery (Cardiothoracic Vascular Surgery)

## 2019-07-27 ENCOUNTER — Other Ambulatory Visit: Payer: Self-pay

## 2019-07-27 ENCOUNTER — Ambulatory Visit (INDEPENDENT_AMBULATORY_CARE_PROVIDER_SITE_OTHER): Payer: Self-pay | Admitting: Thoracic Surgery (Cardiothoracic Vascular Surgery)

## 2019-07-27 VITALS — BP 150/90 | HR 88 | Temp 97.7°F | Resp 20 | Ht 66.0 in | Wt 121.0 lb

## 2019-07-27 DIAGNOSIS — R911 Solitary pulmonary nodule: Secondary | ICD-10-CM

## 2019-07-27 DIAGNOSIS — Z902 Acquired absence of lung [part of]: Secondary | ICD-10-CM

## 2019-07-27 DIAGNOSIS — J9382 Other air leak: Secondary | ICD-10-CM

## 2019-07-27 NOTE — H&P (View-Only) (Signed)
[address]       [place] [address]             [phone]      HPI: Luis [name] returns for a scheduled follow-up visit  Luis [name] is a 69 year old male with history of tobacco abuse, COPD, chronic bronchitis, reflux, arthritis, and lumbar radiculopathy.  He had a robotic right upper lobectomy on 06/17/2019.  He turned out to have a stage IIb (T1, N1) adenocarcinoma.  His postoperative course was complicated by prolonged air leak.  He had intrabronchial valves placed.  His air leak improved initially but then worsened again and he was taken back to the operating room and found to have a ruptured bleb in the middle lobe.  After that was stapled his air leak resolved.  I saw him in the office last week.  He was having difficulty with vivid dreams, hallucinations, and sleep deprivation.  He had been given a prescription for Restoril but that had a paradoxical effect.  He was started on Celexa by Dr. Inda Merlin.  His insomnia has improved in fact he currently is sleeping "about 15 hours a day."  He is having a minimal incisional pain.  He asked about taking BC powder for that.  His breathing has improved since discharge.  Past Medical History:  Diagnosis Date  . Arthritis    through out body  . Chronic bronchitis ([place])   . Concussion   . COPD (chronic obstructive pulmonary disease) ([place])   . Dyspnea   . Emphysema lung ([place])   . Frozen shoulder    LEFT  . GERD (gastroesophageal reflux disease)   . Hard of hearing   . Headache    alot of days, related to spine issues  . Hypertension   . Lumbar radiculopathy 2013  . Peyronie's disease   . Prostatitis 2011   Dr. Gaynelle Arabian  . RLS (restless legs syndrome)   . Shingles 06/13/2018   shingles on back, finished with prednisone, stills has scabs and pain  . Thigh pain    Bilateral Thigh  . Thigh pain 10/13   bilateral  . Tobacco abuse 2010   still uses electronic cig/ emphysema, SOB easily  . Wears partial  dentures    upper    Current Outpatient Medications  Medication Sig Dispense Refill  . acetaminophen (TYLENOL) 650 MG CR tablet Take 650 mg by mouth 2 (two) times daily as needed for pain.    . Cholecalciferol (VITAMIN D) 125 MCG (5000 UT) CAPS Take 5,000 Units by mouth daily.    . citalopram (CELEXA) 20 MG tablet Take 20 mg by mouth daily.    [name] docusate sodium (COLACE) 100 MG capsule Take 100-200 mg by mouth 2 (two) times daily as needed for mild constipation.    [name] guaiFENesin (MUCINEX) 600 MG 12 hr tablet Take 1 tablet (600 mg total) by mouth 2 (two) times daily as needed for cough or to loosen phlegm.    . hydrocortisone cream 1 % Apply 1 application topically daily. Skin dermatitis    . meloxicam (MOBIC) 7.5 MG tablet Take 7.5 mg by mouth daily as needed for pain.    . pantoprazole (PROTONIX) 20 MG tablet Take 20 mg by mouth every evening.    . Tiotropium Bromide-Olodaterol (STIOLTO RESPIMAT) 2.5-2.5 MCG/ACT AERS Inhale 2 puffs into the lungs daily. 4 g 11  . valsartan-hydrochlorothiazide (DIOVAN-HCT) 80-12.5 MG tablet Take 1 tablet by mouth daily.    [name]  vitamin B-12 (CYANOCOBALAMIN) 1000 MCG tablet Take 2,000 mcg by mouth daily.    . traMADol (ULTRAM) 50 MG tablet Take 1 tablet (50 mg total) by mouth every 6 (six) hours as needed for moderate pain. (Patient not taking: Reported on 07/22/2019) 30 tablet 0   No current facility-administered medications for this visit.    Physical Exam BP (!) 150/90   Pulse 88   Temp 97.7 F (36.5 C) (Skin)   Resp 20   Ht 5\' 6"  (1.676 m)   Wt 121 lb (54.9 kg)   SpO2 97% Comment: RA  BMI 19.33 kg/m  69 year old man in no acute distress Alert and oriented x3 with no focal deficits Lungs diminished on right, clear left Cardiac regular rate and rhythm  Diagnostic Tests: Chest x-ray result pending I reviewed the chest x-ray.  No significant change from last week.  Good postoperative appearance.  Impression: Luis [name] is a 69 year old gentleman  who is now about 5 to 6 weeks out from a robotic right upper lobectomy complicated by an air leak necessitating intrabronchial valves and a second operation to staple a ruptured bleb in the middle lobe.  He was feeling very poorly about a week ago but has made a dramatic improvement over the past week.  He is sleeping better.  His appetite is improving.  He is no longer having vivid dreams or hallucinations.  He asked about using BC powder or other NSAID type medications.  I think he is fine to do that.  I would still prefer that he use acetaminophen primarily and then can supplement with NSAIDs.  I am concerned that if he tries to use NSAIDs all the time he may develop gastric irritation or ulcerations.  He is scheduled to have bronchoscopy for IBV removal next Friday.  He sees Dr. Julien Nordmann next week.  Plan: Follow-up with Dr. Julien Nordmann as scheduled Bronchoscopy for IBV removal next Friday  [name], MD [facility] [phone]

## 2019-07-27 NOTE — Progress Notes (Signed)
[address]       [place] [address]             [phone]      HPI: [name] returns for a scheduled follow-up visit  Luis [name] is a 69 year old male with history of tobacco abuse, COPD, chronic bronchitis, reflux, arthritis, and lumbar radiculopathy.  He had a robotic right upper lobectomy on 06/17/2019.  He turned out to have a stage IIb (T1, N1) adenocarcinoma.  His postoperative course was complicated by prolonged air leak.  He had intrabronchial valves placed.  His air leak improved initially but then worsened again and he was taken back to the operating room and found to have a ruptured bleb in the middle lobe.  After that was stapled his air leak resolved.  I saw him in the office last week.  He was having difficulty with vivid dreams, hallucinations, and sleep deprivation.  He had been given a prescription for Restoril but that had a paradoxical effect.  He was started on Celexa by Dr. Inda Merlin.  His insomnia has improved in fact he currently is sleeping "about 15 hours a day."  He is having a minimal incisional pain.  He asked about taking BC powder for that.  His breathing has improved since discharge.  Past Medical History:  Diagnosis Date  . Arthritis    through out body  . Chronic bronchitis ([place])   . Concussion   . COPD (chronic obstructive pulmonary disease) ([place])   . Dyspnea   . Emphysema lung ([place])   . Frozen shoulder    LEFT  . GERD (gastroesophageal reflux disease)   . Hard of hearing   . Headache    alot of days, related to spine issues  . Hypertension   . Lumbar radiculopathy 2013  . Peyronie's disease   . Prostatitis 2011   Dr. Gaynelle Arabian  . RLS (restless legs syndrome)   . Shingles 06/13/2018   shingles on back, finished with prednisone, stills has scabs and pain  . Thigh pain    Bilateral Thigh  . Thigh pain 10/13   bilateral  . Tobacco abuse 2010   still uses electronic cig/ emphysema, SOB easily  . Wears partial  dentures    upper    Current Outpatient Medications  Medication Sig Dispense Refill  . acetaminophen (TYLENOL) 650 MG CR tablet Take 650 mg by mouth 2 (two) times daily as needed for pain.    . Cholecalciferol (VITAMIN D) 125 MCG (5000 UT) CAPS Take 5,000 Units by mouth daily.    . citalopram (CELEXA) 20 MG tablet Take 20 mg by mouth daily.    [name] docusate sodium (COLACE) 100 MG capsule Take 100-200 mg by mouth 2 (two) times daily as needed for mild constipation.    [name] guaiFENesin (MUCINEX) 600 MG 12 hr tablet Take 1 tablet (600 mg total) by mouth 2 (two) times daily as needed for cough or to loosen phlegm.    . hydrocortisone cream 1 % Apply 1 application topically daily. Skin dermatitis    . meloxicam (MOBIC) 7.5 MG tablet Take 7.5 mg by mouth daily as needed for pain.    . pantoprazole (PROTONIX) 20 MG tablet Take 20 mg by mouth every evening.    . Tiotropium Bromide-Olodaterol (STIOLTO RESPIMAT) 2.5-2.5 MCG/ACT AERS Inhale 2 puffs into the lungs daily. 4 g 11  . valsartan-hydrochlorothiazide (DIOVAN-HCT) 80-12.5 MG tablet Take 1 tablet by mouth daily.    [name]  vitamin B-12 (CYANOCOBALAMIN) 1000 MCG tablet Take 2,000 mcg by mouth daily.    . traMADol (ULTRAM) 50 MG tablet Take 1 tablet (50 mg total) by mouth every 6 (six) hours as needed for moderate pain. (Patient not taking: Reported on 07/22/2019) 30 tablet 0   No current facility-administered medications for this visit.    Physical Exam BP (!) 150/90   Pulse 88   Temp 97.7 F (36.5 C) (Skin)   Resp 20   Ht 5\' 6"  (1.676 m)   Wt 121 lb (54.9 kg)   SpO2 97% Comment: RA  BMI 19.68 kg/m  69 year old man in no acute distress Alert and oriented x3 with no focal deficits Lungs diminished on right, clear left Cardiac regular rate and rhythm  Diagnostic Tests: Chest x-ray result pending I reviewed the chest x-ray.  No significant change from last week.  Good postoperative appearance.  Impression: Luis [name] is a 69 year old gentleman  who is now about 5 to 6 weeks out from a robotic right upper lobectomy complicated by an air leak necessitating intrabronchial valves and a second operation to staple a ruptured bleb in the middle lobe.  He was feeling very poorly about a week ago but has made a dramatic improvement over the past week.  He is sleeping better.  His appetite is improving.  He is no longer having vivid dreams or hallucinations.  He asked about using BC powder or other NSAID type medications.  I think he is fine to do that.  I would still prefer that he use acetaminophen primarily and then can supplement with NSAIDs.  I am concerned that if he tries to use NSAIDs all the time he may develop gastric irritation or ulcerations.  He is scheduled to have bronchoscopy for IBV removal next Friday.  He sees Dr. Julien Nordmann next week.  Plan: Follow-up with Dr. Julien Nordmann as scheduled Bronchoscopy for IBV removal next Friday  [name], MD [facility] [phone]

## 2019-07-28 ENCOUNTER — Encounter: Payer: Self-pay | Admitting: Thoracic Surgery (Cardiothoracic Vascular Surgery)

## 2019-07-28 DIAGNOSIS — Z483 Aftercare following surgery for neoplasm: Secondary | ICD-10-CM | POA: Diagnosis not present

## 2019-07-28 DIAGNOSIS — K219 Gastro-esophageal reflux disease without esophagitis: Secondary | ICD-10-CM | POA: Diagnosis not present

## 2019-07-28 DIAGNOSIS — J449 Chronic obstructive pulmonary disease, unspecified: Secondary | ICD-10-CM | POA: Diagnosis not present

## 2019-07-28 DIAGNOSIS — D630 Anemia in neoplastic disease: Secondary | ICD-10-CM | POA: Diagnosis not present

## 2019-07-28 DIAGNOSIS — I70213 Atherosclerosis of native arteries of extremities with intermittent claudication, bilateral legs: Secondary | ICD-10-CM | POA: Diagnosis not present

## 2019-07-28 DIAGNOSIS — C3411 Malignant neoplasm of upper lobe, right bronchus or lung: Secondary | ICD-10-CM | POA: Diagnosis not present

## 2019-07-30 ENCOUNTER — Encounter: Payer: Self-pay | Admitting: *Deleted

## 2019-08-02 DIAGNOSIS — I70213 Atherosclerosis of native arteries of extremities with intermittent claudication, bilateral legs: Secondary | ICD-10-CM | POA: Diagnosis not present

## 2019-08-02 DIAGNOSIS — Z483 Aftercare following surgery for neoplasm: Secondary | ICD-10-CM | POA: Diagnosis not present

## 2019-08-02 DIAGNOSIS — C3411 Malignant neoplasm of upper lobe, right bronchus or lung: Secondary | ICD-10-CM | POA: Diagnosis not present

## 2019-08-02 DIAGNOSIS — K219 Gastro-esophageal reflux disease without esophagitis: Secondary | ICD-10-CM | POA: Diagnosis not present

## 2019-08-02 DIAGNOSIS — J449 Chronic obstructive pulmonary disease, unspecified: Secondary | ICD-10-CM | POA: Diagnosis not present

## 2019-08-02 DIAGNOSIS — D630 Anemia in neoplastic disease: Secondary | ICD-10-CM | POA: Diagnosis not present

## 2019-08-02 NOTE — Progress Notes (Signed)
[facility] Telephone:([phone]) [phone]   [phone]  CONSULT NOTE  REFERRING PHYSICIAN: Dr. Roxan Hockey  REASON FOR CONSULTATION:  Stage II Non-small cell lung cancer, adenocarcinoma.   HPI KOAH CHISENHALL is a 69 y.o. male with a past medical history significant for tobacco abuse, COPD, chronic bronchitis, reflux, hypertension, restless legs, anxiety, and arthritis is referred to the clinic for evaluation of recently diagnosed stage II non-small cell lung cancer, adenocarcinoma.  The patient's evaluation began in April 2021 when the patient had a low-dose screening CT scan performed by the patient's primary care provider.  The patient also had a CT of the head which was unremarkable. The scan demonstrated a spiculated right upper lobe pulmonary nodule.  The patient was subsequently referred to pulmonologist, Dr. Melvyn Novas, who performed a CT/PET scan which noted hyper metabolic activity with an SUV of 6.4.  The patient was then referred to Dr. Roxan Hockey, from cardiothoracic surgery, who performed a right upper lobectomy on 06/17/2019.  This was complicated by a prolonged air leak. The patient's pathology was consistent with adenocarcinoma. The nodule was 1.9 cm with two +9 R lymph nodes and lymphovascular invasion.  The patient was then subsequently referred to our clinic for evaluation and further recommendations regarding his condition.   Overall, the patient is feeling fairly well without any concerning complaints.  He was experiencing some hallucinations and delirium following his hospitalization and pain medication use which has since resolved.  He denies any fever or chills.  He states that he has had night sweats for the last 3 to 4 weeks but they are starting to become less frequent.  He denies any chest pain or hemoptysis.  He reports baseline shortness of breath with exertion and baseline dry cough secondary to his COPD.  He denies any nausea or vomiting.  He reports  his baseline intermittent diarrhea or constipation.  He has frequent headaches secondary to arthritis in his neck.  He denies any headaches at this time.  The patient reports that he lost about 13 pounds or so due to his hospitalization.   The patient's family history consists of a mother who had diabetes mellitus and end-stage renal disease on dialysis.  The patient's father had a history of colorectal cancer, heart disease, and prostate cancer.  He ultimately passed away in his 90s due to Alzheimer's/dementia.  The patient has a brother who had prostate cancer.  The patient has a sister who is otherwise healthy besides for episodic diverticulitis.  The patient is retired and used to work as a Dealer, mostly industrial.  The patient is married and has 4 children from a prior relationship.  The patient's wife has 2 children from a prior relationship.  The patient is a former smoker, having quit 8 to 10 years ago.  He did vape though for the last 8 to 10 years up until his recent surgery.  He did smoke approximately 45 years averaging 2 packs/day.  Patient denies any drug use.  He did typically drink about a 12 pack of beer per week but does not at this time.   HPI  Past Medical History:  Diagnosis Date  . Arthritis    through out body  . Chronic bronchitis ([place])   . Concussion   . COPD (chronic obstructive pulmonary disease) ([place])   . Dyspnea   . Emphysema lung ([place])   . Frozen shoulder    LEFT  . GERD (gastroesophageal reflux disease)   . Hard of  hearing   . Headache    alot of days, related to spine issues  . Hypertension   . Lumbar radiculopathy 2013  . Peyronie's disease   . Prostatitis 2011   Dr. Gaynelle Arabian  . RLS (restless legs syndrome)   . Shingles 06/13/2018   shingles on back, finished with prednisone, stills has scabs and pain  . Thigh pain    Bilateral Thigh  . Thigh pain 10/13   bilateral  . Tobacco abuse 2010   still uses electronic cig/ emphysema, SOB easily  .  Wears partial dentures    upper    Past Surgical History:  Procedure Laterality Date  . CATARACT EXTRACTION W/PHACO Right 07/08/2018   Procedure: CATARACT EXTRACTION PHACO AND INTRAOCULAR LENS PLACEMENT ([place]) RIGHT;  Surgeon: Leandrew Koyanagi, MD;  Location: [facility];  Service: Ophthalmology;  Laterality: Right;  . CATARACT EXTRACTION W/PHACO Left 07/29/2018   Procedure: CATARACT EXTRACTION PHACO AND INTRAOCULAR LENS PLACEMENT ([place]) LEFT TORIC LENS;  Surgeon: Leandrew Koyanagi, MD;  Location: [facility];  Service: Ophthalmology;  Laterality: Left;  . CHEST TUBE INSERTION Right 07/01/2019   Procedure: CHEST TUBE INSERTION;  Surgeon: [name], MD;  Location: [facility];  Service: Thoracic;  Laterality: Right;  . COLONOSCOPY    . INTERCOSTAL NERVE BLOCK Right 06/17/2019   Procedure: INTERCOSTAL NERVE BLOCK;  Surgeon: [name], MD;  Location: [facility];  Service: Thoracic;  Laterality: Right;  . INTERCOSTAL NERVE BLOCK Right 07/01/2019   Procedure: INTERCOSTAL NERVE BLOCK;  Surgeon: [name], MD;  Location: [facility];  Service: Thoracic;  Laterality: Right;  . TONSILLECTOMY    . VIDEO ASSISTED THORACOSCOPY Right 07/01/2019   Procedure: VIDEO ASSISTED THORACOSCOPY - REMOVAL OF RIGHT MIDDLE LOBE BLEBS;  Surgeon: [name], MD;  Location: [facility];  Service: Thoracic;  Laterality: Right;  [name] VIDEO BRONCHOSCOPY WITH INSERTION OF INTERBRONCHIAL VALVE (IBV) Right 06/25/2019   Procedure: VIDEO BRONCHOSCOPY WITH INSERTION OF INTERBRONCHIAL VALVE (IBV); Size 7 IBV into Right Loer Lobe (RLL) Superior Segment, Size 9 IBV into RLL Basilar Segment;  Surgeon: [name], MD;  Location: [facility];  Service: Thoracic;  Laterality: Right;    Family History  Problem Relation Age of Onset  . Diabetes Mother   . Hyperlipidemia Father   . Hypertension Father     Social History Social History   Tobacco Use  . Smoking status: Former Smoker     Packs/day: 2.00    Years: 40.00    Pack years: 80.00    Types: Cigarettes    Quit date: 01/14/2009    Years since quitting: 10.5  . Smokeless tobacco: Former Network engineer  . Vaping Use: Every day  . Start date: 01/15/1999  . Substances: Nicotine, Flavoring  Substance Use Topics  . Alcohol use: Yes    Alcohol/week: 12.0 standard drinks    Types: 12 Cans of beer per week  . Drug use: No    Allergies  Allergen Reactions  . Doxycycline Itching and Other (See Comments)    Itching/ redness  . Tramadol Other (See Comments)    Hallucination    Current Outpatient Medications  Medication Sig Dispense Refill  . acetaminophen (TYLENOL) 650 MG CR tablet Take 650 mg by mouth 2 (two) times daily as needed for pain.    . Cholecalciferol (VITAMIN D) 125 MCG (5000 UT) CAPS Take 5,000 Units by mouth daily.    . citalopram (CELEXA) 20 MG tablet Take 20 mg by  mouth daily.    [name] docusate sodium (COLACE) 100 MG capsule Take 100-200 mg by mouth 2 (two) times daily as needed for mild constipation.    [name] guaiFENesin (MUCINEX) 600 MG 12 hr tablet Take 1 tablet (600 mg total) by mouth 2 (two) times daily as needed for cough or to loosen phlegm.    . hydrocortisone cream 1 % Apply 1 application topically daily. Skin dermatitis    . meloxicam (MOBIC) 7.5 MG tablet Take 7.5 mg by mouth daily as needed for pain.    . pantoprazole (PROTONIX) 20 MG tablet Take 20 mg by mouth every evening.    . Tiotropium Bromide-Olodaterol (STIOLTO RESPIMAT) 2.5-2.5 MCG/ACT AERS Inhale 2 puffs into the lungs daily. 4 g 11  . traMADol (ULTRAM) 50 MG tablet Take 1 tablet (50 mg total) by mouth every 6 (six) hours as needed for moderate pain. (Patient not taking: Reported on 07/22/2019) 30 tablet 0  . valsartan-hydrochlorothiazide (DIOVAN-HCT) 80-12.5 MG tablet Take 1 tablet by mouth daily.    . vitamin B-12 (CYANOCOBALAMIN) 1000 MCG tablet Take 2,000 mcg by mouth daily.     No current facility-administered medications for this  visit.   REVIEW OF SYSTEMS:   Review of Systems  Constitutional: Positive for night sweats. Negative for appetite change, chills, fatigue, fever and unexpected weight change.  HENT: Negative for mouth sores, nosebleeds, sore throat and trouble swallowing.   Eyes: Negative for eye problems and icterus.  Respiratory: Positive for baseline dry cough and shortness of breath with exertion.  Negative for hemoptysis and wheezing.   Cardiovascular: Negative for chest pain and leg swelling.  Gastrointestinal: Negative for abdominal pain, constipation, diarrhea, nausea and vomiting.  Genitourinary: Negative for bladder incontinence, difficulty urinating, dysuria, frequency and hematuria.   Musculoskeletal: Negative for back pain, gait problem, neck pain and neck stiffness.  Skin: Negative for itching and rash.  Neurological: Positive for chronic headaches. Negative for dizziness, extremity weakness, gait problem, light-headedness and seizures.  Hematological: Negative for adenopathy. Does not bruise/bleed easily.  Psychiatric/Behavioral: Negative for confusion, depression and sleep disturbance. The patient is not nervous/anxious.     PHYSICAL EXAMINATION:  Blood pressure (!) 152/101, pulse 83, temperature 98.1 F (36.7 C), temperature source Temporal, resp. rate 16, weight 129 lb 1.6 oz (58.6 kg), SpO2 97 %.  ECOG PERFORMANCE STATUS: 1  Physical Exam  Constitutional: Oriented to person, place, and time and well-developed, well-nourished, and in no distress.  HENT:  Head: Normocephalic and atraumatic.  Mouth/Throat: Oropharynx is clear and moist. No oropharyngeal exudate.  Eyes: Conjunctivae are normal. Right eye exhibits no discharge. Left eye exhibits no discharge. No scleral icterus.  Neck: Normal range of motion. Neck supple.  Cardiovascular: Normal rate, regular rhythm, normal heart sounds and intact distal pulses.   Pulmonary/Chest: Effort normal. Decreased breath sounds in right lung. No  respiratory distress. No wheezes. No rales.  Abdominal: Soft. Bowel sounds are normal. Exhibits no distension and no mass. There is no tenderness.  Musculoskeletal: Normal range of motion. Exhibits no edema.  Lymphadenopathy:    No cervical adenopathy.  Neurological: Alert and oriented to person, place, and time. Exhibits normal muscle tone. Gait normal. Coordination normal.  Skin: Skin is warm and dry. No rash noted. Not diaphoretic. No erythema. No pallor.  Psychiatric: Mood, memory and judgment normal.  Vitals reviewed.  LABORATORY DATA: Lab Results  Component Value Date   WBC 12.3 (H) 08/03/2019   HGB 14.5 08/03/2019   HCT 42.7 08/03/2019   MCV  88.2 08/03/2019   PLT 257 08/03/2019      Chemistry      Component Value Date/Time   NA 137 08/03/2019 1250   K 4.6 08/03/2019 1250   CL 101 08/03/2019 1250   CO2 29 08/03/2019 1250   BUN 13 08/03/2019 1250   CREATININE 0.92 08/03/2019 1250      Component Value Date/Time   CALCIUM 9.3 08/03/2019 1250   ALKPHOS 74 08/03/2019 1250   AST 13 (L) 08/03/2019 1250   ALT 15 08/03/2019 1250   BILITOT 0.4 08/03/2019 1250       RADIOGRAPHIC STUDIES: DG Chest 1 View  Result Date: 07/05/2019 CLINICAL DATA:  Chest tube.  Pneumothorax. EXAM: CHEST  1 VIEW COMPARISON:  07/04/2019 FINDINGS: Pigtail chest tube on the right unchanged in position. Right lower lobe consolidation and volume loss stable. No pneumothorax. Surgical staples in the right lung. Endobronchial valve the right. Extensive subcutaneous emphysema right greater than left is unchanged. Left lung is hyperinflated and clear. IMPRESSION: Right chest tube in place without pneumothorax. Right lower lobe consolidation and volume loss unchanged. Electronically Signed   By: Franchot Gallo M.D.   On: 07/05/2019 08:50   DG Chest 2 View  Result Date: 07/27/2019 CLINICAL DATA:  Postop VATS and right upper lobectomy 07/01/2019. EXAM: CHEST - 2 VIEW COMPARISON:  Radiograph 07/20/2019 and  07/06/2019.  CT 04/26/2019. FINDINGS: The heart size and mediastinal contours are stable status post right thoracotomy and upper lobe resection. Intrabronchial valves remain in place with stable volume loss and perihilar atelectasis at the right lung base. No pneumothorax. The left lung is clear. No pneumothorax or residual soft tissue emphysema. IMPRESSION: Stable postoperative chest. No acute findings. Electronically Signed   By: Richardean Sale M.D.   On: 07/27/2019 13:00   DG Chest 2 View  Result Date: 07/20/2019 CLINICAL DATA:  Post lobectomy of lung EXAM: CHEST - 2 VIEW COMPARISON:  07/06/2019 FINDINGS: Similar mild elevation of the right hemidiaphragm. There is improved right lung aeration with persistent patchy density in the right lower lung and perihilar architectural distortion. No significant pleural effusion. Rightward mediastinal shift due to volume loss. Left lung is clear apart with background emphysema. No pneumothorax. Small residual right chest wall subcutaneous emphysema. Normal heart size. IMPRESSION: Improved right lung aeration compared to 07/06/2019 with persistent patchy density. Electronically Signed   By: Macy Mis M.D.   On: 07/20/2019 15:06   DG Chest 2 View  Result Date: 07/06/2019 CLINICAL DATA:  Chest tube removal EXAM: CHEST - 2 VIEW COMPARISON:  07/05/2019 FINDINGS: Patchy opacity within the right lung base persists with slightly improved aeration compared to prior. No right-sided pneumothorax is identified. Stable cardiomediastinal contours with rightward mediastinal shift and volume loss in the right hemithorax. Left lung is well expanded, clear. Extensive chest wall subcutaneous emphysema. IMPRESSION: 1. Persistent patchy opacity within the right lung base with slightly improved aeration compared to prior. 2. No pneumothorax identified. Electronically Signed   By: Davina Poke D.O.   On: 07/06/2019 09:46   DG Chest Port 1 View  Result Date: 07/05/2019 CLINICAL  DATA:  Status post right chest tube removal. Previous right upper lobectomy. Previous insertion of right interbronchial valve. EXAM: PORTABLE CHEST 1 VIEW 9:15 a.m. COMPARISON:  07/05/2019 at 6:54 a.m. and 07/04/2019 FINDINGS: The right-sided chest tube has been removed. No pneumothorax. Persistent atelectasis/infiltrate at the right lung base. Left lung is clear.  Heart size and vascularity are normal. Slightly less prominent subcutaneous emphysema in  the chest. No significant bone abnormality. IMPRESSION: 1. No pneumothorax. 2. Persistent atelectasis/infiltrate at the right lung base. Electronically Signed   By: Lorriane Shire M.D.   On: 07/05/2019 10:54    ASSESSMENT: This is a very pleasant 69 year old Caucasian male diagnosed with stage IIb (T1, N1, M0) non-small cell lung cancer, adenocarcinoma.  He presented with a right upper lobe nodule and two positive 9R lymph nodes.  The patient was diagnosed in June 2021.    PLAN: The patient was seen with Dr. Julien Nordmann today.  Dr. Julien Nordmann had a lengthy discussion with the patient and his wife about his current condition and recommendations.  Given the positive lymph nodes, Dr. Julien Nordmann would recommend adjuvant treatment.  Dr. Julien Nordmann discussed the standard of care of treatment with chemotherapy versus enrolling the patient on the alliance/alchemist trial which would involve testing the patient for targetable mutations.  If the patient has a targetable mutation, he will be given the option of enrolling in the trial and pursing treatment with targeted oral chemotherapy with or without the standard of care treatment.  If the patient's molecular studies are negative, and the patient has the option of enrolling in the second arm of the trial which would involve the standard of care plus or minus immunotherapy.  The patient is interested in the clinical trial and has met with the clinical research nurses during his visit today.  We will see the patient back for a  follow-up visit in 2 weeks for evaluation and to review the results of his molecular studies and for more detailed discussion about his current condition and recommended treatment option.  All questions were answered. The patient knows to call the clinic with any problems, questions or concerns. We can certainly see the patient much sooner if necessary.  Thank you so much for allowing me to participate in the care of Haynes Bast. I will continue to follow up the patient with you and assist in his care.  The total time spent in the appointment was 60 minutes.  Disclaimer: This note was dictated with voice recognition software. Similar sounding words can inadvertently be transcribed and may not be corrected upon review.   Ozie Lupe L Maegen Wigle August 03, 2019, 2:55 PM  ADDENDUM: Hematology/Oncology Attending: I had a face-to-face encounter with the patient today.  I recommended his care plan.  This is a very pleasant 69 years old white male who was during evaluation had CT screening of the chest because of his long smoking history.  The scan was performed on 04/26/2019 and it showed a spiculated right upper lobe nodule measuring 1.05 cm.  The patient was seen by Dr. Shyrl Numbers and a PET scan was performed on 05/17/2019 and it showed hypermetabolic spiculated irregular 2.1 x 1.2 cm right upper lobe solid pulmonary nodule compatible with primary bronchogenic carcinoma.  There was no hypermetabolic thoracic adenopathy or distant metastatic disease.  The scan also showed posterior left upper lobe indistinct 0.8 cm subsolid pulmonary nodule along the major fissure without significant FDG uptake but warrants surveillance on future follow-up imaging studies. The patient was referred to Dr. Roxan Hockey and on June 25, 2019 he underwent robotic assisted right upper lobectomy with lymph node dissection.  The final pathology ([id number]) showed invasive poorly differentiated adenocarcinoma measuring 1.9 cm.   There was evidence for lymphovascular invasion but no visceral pleural invasion identified and the resection margin was negative.  There was evidence of metastatic carcinoma in 2 lymph nodes at level 10 R. His  surgery was complicated by air leak which was later fixed by Dr. Roxan Hockey. The patient was referred to [place] today for evaluation and recommendation regarding his condition.  I had a lengthy discussion with the patient and his wife about his condition and treatment options the patient has a stage IIb (T1b, N1, M0) non-small cell lung cancer, adenocarcinoma.  I discussed with the patient his treatment options and I gave him the option of continuous observation and monitoring but also explained to him that there is a survival benefit for adjuvant systemic chemotherapy for patient with a stage IIb.  We also discussed with the patient enrollment in the alchemist clinical trial for molecular testing and if the patient has any actionable mutation he may benefit for treatment with targeted therapy plus/minus the adjuvant systemic chemotherapy versus treatment with adjuvant systemic chemotherapy plus/minus immunotherapy as part of the second portion of the trial. The patient is interested in proceeding with the tissue testing with the first part of the trial.  He will be seen by the clinical research nurse later today for more discussion of this option and to sign a consent. We will see him back for follow-up visit in around 2 weeks for discussion of his treatment options based on the molecular studies and eligibility for the alliance clinical trial. The patient was advised to call immediately if he has any other concerning symptoms in the interval.  Disclaimer: This note was dictated with voice recognition software. Similar sounding words can inadvertently be transcribed and may be missed upon review. Eilleen Kempf, MD 08/03/19 .

## 2019-08-03 ENCOUNTER — Other Ambulatory Visit: Payer: Self-pay | Admitting: *Deleted

## 2019-08-03 ENCOUNTER — Other Ambulatory Visit: Payer: Self-pay

## 2019-08-03 ENCOUNTER — Encounter: Payer: Self-pay | Admitting: Physician Assistant

## 2019-08-03 ENCOUNTER — Encounter: Payer: Self-pay | Admitting: Radiology

## 2019-08-03 ENCOUNTER — Inpatient Hospital Stay: Payer: Medicare Other

## 2019-08-03 ENCOUNTER — Inpatient Hospital Stay: Payer: Medicare Other | Attending: Physician Assistant | Admitting: Physician Assistant

## 2019-08-03 DIAGNOSIS — Z992 Dependence on renal dialysis: Secondary | ICD-10-CM | POA: Insufficient documentation

## 2019-08-03 DIAGNOSIS — C3490 Malignant neoplasm of unspecified part of unspecified bronchus or lung: Secondary | ICD-10-CM | POA: Insufficient documentation

## 2019-08-03 DIAGNOSIS — Z79899 Other long term (current) drug therapy: Secondary | ICD-10-CM | POA: Diagnosis not present

## 2019-08-03 DIAGNOSIS — C3491 Malignant neoplasm of unspecified part of right bronchus or lung: Secondary | ICD-10-CM

## 2019-08-03 DIAGNOSIS — C3411 Malignant neoplasm of upper lobe, right bronchus or lung: Secondary | ICD-10-CM | POA: Diagnosis not present

## 2019-08-03 DIAGNOSIS — Z7189 Other specified counseling: Secondary | ICD-10-CM | POA: Diagnosis not present

## 2019-08-03 DIAGNOSIS — Z902 Acquired absence of lung [part of]: Secondary | ICD-10-CM | POA: Insufficient documentation

## 2019-08-03 DIAGNOSIS — F0280 Dementia in other diseases classified elsewhere without behavioral disturbance: Secondary | ICD-10-CM | POA: Insufficient documentation

## 2019-08-03 DIAGNOSIS — G2581 Restless legs syndrome: Secondary | ICD-10-CM | POA: Insufficient documentation

## 2019-08-03 DIAGNOSIS — N186 End stage renal disease: Secondary | ICD-10-CM | POA: Diagnosis not present

## 2019-08-03 DIAGNOSIS — R911 Solitary pulmonary nodule: Secondary | ICD-10-CM

## 2019-08-03 DIAGNOSIS — Z87891 Personal history of nicotine dependence: Secondary | ICD-10-CM | POA: Insufficient documentation

## 2019-08-03 LAB — CBC WITH DIFFERENTIAL (CANCER CENTER ONLY)
Abs Immature Granulocytes: 0.07 10*3/uL (ref 0.00–0.07)
Basophils Absolute: 0.1 10*3/uL (ref 0.0–0.1)
Basophils Relative: 0 %
Eosinophils Absolute: 0.3 10*3/uL (ref 0.0–0.5)
Eosinophils Relative: 2 %
HCT: 42.7 % (ref 39.0–52.0)
Hemoglobin: 14.5 g/dL (ref 13.0–17.0)
Immature Granulocytes: 1 %
Lymphocytes Relative: 13 %
Lymphs Abs: 1.6 10*3/uL (ref 0.7–4.0)
MCH: 30 pg (ref 26.0–34.0)
MCHC: 34 g/dL (ref 30.0–36.0)
MCV: 88.2 fL (ref 80.0–100.0)
Monocytes Absolute: 0.9 10*3/uL (ref 0.1–1.0)
Monocytes Relative: 8 %
Neutro Abs: 9.4 10*3/uL — ABNORMAL HIGH (ref 1.7–7.7)
Neutrophils Relative %: 76 %
Platelet Count: 257 10*3/uL (ref 150–400)
RBC: 4.84 MIL/uL (ref 4.22–5.81)
RDW: 13.4 % (ref 11.5–15.5)
WBC Count: 12.3 10*3/uL — ABNORMAL HIGH (ref 4.0–10.5)
nRBC: 0 % (ref 0.0–0.2)

## 2019-08-03 LAB — CMP (CANCER CENTER ONLY)
ALT: 15 U/L (ref 0–44)
AST: 13 U/L — ABNORMAL LOW (ref 15–41)
Albumin: 3.4 g/dL — ABNORMAL LOW (ref 3.5–5.0)
Alkaline Phosphatase: 74 U/L (ref 38–126)
Anion gap: 7 (ref 5–15)
BUN: 13 mg/dL (ref 8–23)
CO2: 29 mmol/L (ref 22–32)
Calcium: 9.3 mg/dL (ref 8.9–10.3)
Chloride: 101 mmol/L (ref 98–111)
Creatinine: 0.92 mg/dL (ref 0.61–1.24)
GFR, Est AFR Am: >60 mL/min (ref >60)
GFR, Estimated: >60 mL/min (ref >60)
Glucose, Bld: 91 mg/dL (ref 70–99)
Potassium: 4.6 mmol/L (ref 3.5–5.1)
Sodium: 137 mmol/L (ref 135–145)
Total Bilirubin: 0.4 mg/dL (ref 0.3–1.2)
Total Protein: 6.4 g/dL — ABNORMAL LOW (ref 6.5–8.1)

## 2019-08-03 NOTE — Research (Signed)
ALCHEMIST [id number]: ADJUVANT LUNG CANCER ENRICHMENT MARKER IDENTIFICATION AND SEQUENCING TRIAL  08/03/2019 2:45PM  CONSENT: Met with Luis [name] accompanied by his wife Luis [name] along with Clinical Research Nurse, Foye Spurling, RN in exam room 13 for 30 minutes to discuss the ALCHEMIST study. We reviewedthe ALCHEMIST consent (protocol version date 09/22/2018 [place] Active Date 10/15/2018)andHIPPAform(dated 08/21/2012)with patient in their entirety. Explained the purpose of the study along with potential risks and benefits of participation. Reviewed the study required assessments and timeline for completing these assessments. Informed patient that participation is completely voluntary and he may withdraw consent at any time.Upon completion of review, patient and his wife were offered to ask any questions or express any concerns. They both confirmed understanding and had no further questions. Luis [name] signed and dated the consentand HIPPA form voluntarily. Patient agreed to the optional use of his blood and tissue for future research. Copies of signed/dated consent andHIPPAformwill begiven to patient for his records on his lab appointment date (to be scheduled). Eligibility was confirmed by myself and verified by clinical research nurse, Foye Spurling, RN. Luis [name] and his wife were thanked for their time and I will keep in touch about the next steps. The plan is to also discuss DCP at the next visit. A copy of the DCP consent was given to patient to review for any questions or concerns.   Carol Ada, RT (R)(T) Clinical Research Coordinator 08/03/19 3:27PM

## 2019-08-03 NOTE — Patient Instructions (Signed)
Summary:  -There are two main categories of lung cancer, they are named based on the size of the cancer cell. One is called Non-Small cell lung cancer. The other type is Small Cell Lung Cancer -The sample (biopsy) that they took of your tumor was consistent with a subtype of Non-small cell lung cancer called Adenocarcinoma. This is the most common type of lung cancer.  -We covered a lot of important information at your appointment today regarding what the treatment plan is moving forward. Here are the the main points that were discussed at your office visit with [place] today:  -Because of the lymph nodes were involved, that would put you as a stage IIb. Since it involved the lymph nodes, we would recommend further treatment which can be target therapy (look for a genetic marker), immunotherapy, or chemotherapy.  -Please refer to the picture about the options.  Follow up:  -We will see you back for a follow up visit once we have the results of the genetic studies.   -If you need to reach [place] at any time, the main office number to the cancer center is [phone], when you call, ask to speak to either Cassie's or Dr. Worthy Flank nurse.

## 2019-08-03 NOTE — Progress Notes (Signed)
Your procedure is scheduled on Friday July 23.  Report to [facility] Main Entrance "A" at 05:30 A.M., and check in at the Admitting office.  Call this number if you have problems the morning of surgery: [phone]  Call [phone] if you have any questions prior to your surgery date Monday-Friday 8am-4pm   Remember: Do not eat or drink after midnight the night before your surgery    Take these medicines the morning of surgery with A SIP OF WATER: citalopram (CELEXA)  Tiotropium Bromide-Olodaterol (STIOLTO RESPIMAT) ---- Please bring all inhalers with you the day of surgery.   If needed: acetaminophen (TYLENOL) guaiFENesin (MUCINEX)   As of today, STOP taking any meloxicam (MOBIC), Aspirin (unless otherwise instructed by your surgeon), Aleve, Naproxen, Ibuprofen, Motrin, Advil, Goody's, BC's, all herbal medications, fish oil, and all vitamins.    The Morning of Surgery  Do not wear jewelry.  Do not wear lotions, powders, colognes, or deodorant Men may shave face and neck.  Do not bring valuables to the hospital.  [facility] is not responsible for any belongings or valuables.  If you are a smoker, DO NOT Smoke 24 hours prior to surgery  If you wear a CPAP at night please bring your mask the morning of surgery   Remember that you must have someone to transport you home after your surgery, and remain with you for 24 hours if you are discharged the same day.   Please bring cases for contacts, glasses, hearing aids, dentures or bridgework because it cannot be worn into surgery.    Leave your suitcase in the car.  After surgery it may be brought to your room.  For patients admitted to the hospital, discharge time will be determined by your treatment team.  Patients discharged the day of surgery will not be allowed to drive home.    Special instructions:   [place]- Preparing For Surgery  Before surgery, you can play an important role. Because skin is not  sterile, your skin needs to be as free of germs as possible. You can reduce the number of germs on your skin by washing with CHG (chlorahexidine gluconate) Soap before surgery.  CHG is an antiseptic cleaner which kills germs and bonds with the skin to continue killing germs even after washing.    Oral Hygiene is also important to reduce your risk of infection.  Remember - BRUSH YOUR TEETH THE MORNING OF SURGERY WITH YOUR REGULAR TOOTHPASTE  Please do not use if you have an allergy to CHG or antibacterial soaps. If your skin becomes reddened/irritated stop using the CHG.  Do not shave (including legs and underarms) for at least 48 hours prior to first CHG shower. It is OK to shave your face.  Please follow these instructions carefully.   1. Shower the NIGHT BEFORE SURGERY and the MORNING OF SURGERY with CHG Soap.   2. If you chose to wash your hair and body, wash as usual with your normal shampoo and body-wash/soap.  3. Rinse your hair and body thoroughly to remove the shampoo and soap.  4. Apply CHG directly to the skin (ONLY FROM THE NECK DOWN) and wash gently with a scrungie or a clean washcloth.   5. Do not use on open wounds or open sores. Avoid contact with your eyes, ears, mouth and genitals (private parts). Wash Face and genitals (private parts)  with your normal soap.   6. Wash thoroughly, paying special attention to the area where your surgery  will be performed.  7. Thoroughly rinse your body with warm water from the neck down.  8. DO NOT shower/wash with your normal soap after using and rinsing off the CHG Soap.  9. Pat yourself dry with a CLEAN TOWEL.  10. Wear CLEAN PAJAMAS to bed the night before surgery  11. Place CLEAN SHEETS on your bed the night of your first shower and DO NOT SLEEP WITH PETS.  12. Wear comfortable clothes the morning of surgery.     Day of Surgery:  Please shower the morning of surgery with the CHG soap Do not apply any  deodorants/lotions. Please wear clean clothes to the hospital/surgery center.   Remember to brush your teeth WITH YOUR REGULAR TOOTHPASTE.   Please read over the following fact sheets that you were given.

## 2019-08-04 ENCOUNTER — Encounter (HOSPITAL_COMMUNITY)
Admission: RE | Admit: 2019-08-04 | Discharge: 2019-08-04 | Disposition: A | Discharge status: Home / Self Care | Payer: Medicare Other | Source: Ambulatory Visit | Attending: Internal Medicine | Admitting: Internal Medicine

## 2019-08-04 ENCOUNTER — Other Ambulatory Visit: Payer: Self-pay

## 2019-08-04 ENCOUNTER — Telehealth: Payer: Self-pay | Admitting: Physician Assistant

## 2019-08-04 ENCOUNTER — Ambulatory Visit (HOSPITAL_COMMUNITY)
Admission: RE | Admit: 2019-08-04 | Discharge: 2019-08-04 | Disposition: A | Discharge status: Home / Self Care | Payer: Medicare Other | Source: Ambulatory Visit | Attending: Thoracic Surgery (Cardiothoracic Vascular Surgery) | Admitting: Thoracic Surgery (Cardiothoracic Vascular Surgery)

## 2019-08-04 ENCOUNTER — Other Ambulatory Visit (HOSPITAL_COMMUNITY)
Admission: RE | Admit: 2019-08-04 | Discharge: 2019-08-04 | Disposition: A | Discharge status: Home / Self Care | Payer: Medicare Other | Source: Ambulatory Visit | Attending: Thoracic Surgery (Cardiothoracic Vascular Surgery) | Admitting: Thoracic Surgery (Cardiothoracic Vascular Surgery)

## 2019-08-04 ENCOUNTER — Encounter (HOSPITAL_COMMUNITY): Payer: Self-pay

## 2019-08-04 DIAGNOSIS — J90 Pleural effusion, not elsewhere classified: Secondary | ICD-10-CM | POA: Diagnosis not present

## 2019-08-04 DIAGNOSIS — R0600 Dyspnea, unspecified: Secondary | ICD-10-CM | POA: Diagnosis not present

## 2019-08-04 DIAGNOSIS — J9382 Other air leak: Secondary | ICD-10-CM | POA: Insufficient documentation

## 2019-08-04 DIAGNOSIS — Z01812 Encounter for preprocedural laboratory examination: Secondary | ICD-10-CM | POA: Insufficient documentation

## 2019-08-04 DIAGNOSIS — Z20822 Contact with and (suspected) exposure to covid-19: Secondary | ICD-10-CM | POA: Diagnosis not present

## 2019-08-04 LAB — COMPREHENSIVE METABOLIC PANEL
ALT: 15 U/L (ref 0–44)
AST: 16 U/L (ref 15–41)
Albumin: 3.4 g/dL — ABNORMAL LOW (ref 3.5–5.0)
Alkaline Phosphatase: 64 U/L (ref 38–126)
Anion gap: 9 (ref 5–15)
BUN: 9 mg/dL (ref 8–23)
CO2: 28 mmol/L (ref 22–32)
Calcium: 9 mg/dL (ref 8.9–10.3)
Chloride: 98 mmol/L (ref 98–111)
Creatinine, Ser: 0.98 mg/dL (ref 0.61–1.24)
GFR calc Af Amer: >60 mL/min (ref >60)
GFR calc non Af Amer: >60 mL/min (ref >60)
Glucose, Bld: 151 mg/dL — ABNORMAL HIGH (ref 70–99)
Potassium: 4.2 mmol/L (ref 3.5–5.1)
Sodium: 135 mmol/L (ref 135–145)
Total Bilirubin: 0.7 mg/dL (ref 0.3–1.2)
Total Protein: 6.4 g/dL — ABNORMAL LOW (ref 6.5–8.1)

## 2019-08-04 LAB — SARS CORONAVIRUS 2 (TAT 6-24 HRS): SARS Coronavirus 2: NEGATIVE

## 2019-08-04 LAB — PROTIME-INR
INR: 0.9 (ref 0.8–1.2)
Prothrombin Time: 11.6 seconds (ref 11.4–15.2)

## 2019-08-04 LAB — APTT: aPTT: 31 seconds (ref 24–36)

## 2019-08-04 NOTE — Telephone Encounter (Signed)
Scheduled per los. Called and left msg. Mailed printout  °

## 2019-08-04 NOTE — Patient Outreach (Signed)
[facility]) Care Management  08/04/2019  ANQUAN AZZARELLO 02-08-1950 [id number]   Telephone assessment:  Placed call to patient and spoke with wife who states decreased anxiety and breathing some better.  Patient has preop today for procedure to remove lung valves later in the week.   PLAN: reviewed with wife the need to complete assessments. Will plan to call back tomorrow at a more convenient time.  Tomasa Rand, RN, BSN, CEN [facility] [employer] [phone]

## 2019-08-04 NOTE — Progress Notes (Signed)
PCP - [name], MD Cardiologist - pt denies   Chest x-ray - 08/04/19 EKG - 06/30/19 Stress Test - pt denies ECHO - pt denies Cardiac Cath - pt denies    Blood Thinner Instructions:n/a Aspirin Instructions:n/a    COVID TEST- 08/04/19  Coronavirus Screening  Have you experienced the following symptoms:  Cough yes/no: No Fever (>100.47F)  yes/no: No Runny nose yes/no: No Sore throat yes/no: No Difficulty breathing/shortness of breath  yes/no: No  Have you or a family member traveled in the last 14 days and where? yes/no: No   If the patient indicates "YES" to the above questions, their PAT will be rescheduled to limit the exposure to others and, the surgeon will be notified. THE PATIENT WILL NEED TO BE ASYMPTOMATIC FOR 14 DAYS.   If the patient is not experiencing any of these symptoms, the PAT nurse will instruct them to NOT bring anyone with them to their appointment since they may have these symptoms or traveled as well.   Please remind your patients and families that hospital visitation restrictions are in effect and the importance of the restrictions.     Anesthesia review: n/a  Patient denies shortness of breath, fever, cough and chest pain at PAT appointment   All instructions explained to the patient, with a verbal understanding of the material. Patient agrees to go over the instructions while at home for a better understanding. Patient also instructed to self quarantine after being tested for COVID-19. The opportunity to ask questions was provided.

## 2019-08-05 ENCOUNTER — Other Ambulatory Visit: Payer: Self-pay

## 2019-08-05 NOTE — Anesthesia Preprocedure Evaluation (Addendum)
Anesthesia Evaluation  Patient identified by MRN, date of birth, ID band Patient awake    Reviewed: Allergy & Precautions, NPO status , Patient's Chart, lab work & pertinent test results  Airway Mallampati: II  TM Distance: >3 FB Neck ROM: Full    Dental  (+) Dental Advisory Given, Chipped, Partial Upper   Pulmonary shortness of breath, COPD,  COPD inhaler, former smoker,  Persistent air leak s/p IBV    Pulmonary exam normal breath sounds clear to auscultation       Cardiovascular hypertension, Pt. on medications (-) angina+ Peripheral Vascular Disease  (-) CAD and (-) Past MI Normal cardiovascular exam Rhythm:Regular Rate:Normal     Neuro/Psych  Headaches,  Neuromuscular disease negative psych ROS   GI/Hepatic Neg liver ROS, GERD  Medicated,  Endo/Other  negative endocrine ROS  Renal/GU negative Renal ROS     Musculoskeletal  (+) Arthritis , Left frozen shoulder    Abdominal   Peds  Hematology negative hematology ROS (+)   Anesthesia Other Findings   Reproductive/Obstetrics                            Anesthesia Physical  Anesthesia Plan  ASA: III  Anesthesia Plan: General   Post-op Pain Management:    Induction: Intravenous  PONV Risk Score and Plan: 3 and Dexamethasone, Ondansetron and Treatment may vary due to age or medical condition  Airway Management Planned: Oral ETT  Additional Equipment:   Intra-op Plan:   Post-operative Plan: Extubation in OR  Informed Consent: I have reviewed the patients History and Physical, chart, labs and discussed the procedure including the risks, benefits and alternatives for the proposed anesthesia with the patient or authorized representative who has indicated his/her understanding and acceptance.     Dental advisory given  Plan Discussed with: CRNA  Anesthesia Plan Comments:        Anesthesia Quick Evaluation

## 2019-08-06 ENCOUNTER — Encounter: Payer: Self-pay | Admitting: Radiology

## 2019-08-06 ENCOUNTER — Ambulatory Visit (HOSPITAL_COMMUNITY)
Admission: RE | Admit: 2019-08-06 | Discharge: 2019-08-06 | Disposition: A | Discharge status: Home / Self Care | Payer: Medicare Other | Attending: Thoracic Surgery (Cardiothoracic Vascular Surgery) | Admitting: Thoracic Surgery (Cardiothoracic Vascular Surgery)

## 2019-08-06 ENCOUNTER — Other Ambulatory Visit: Payer: Self-pay

## 2019-08-06 ENCOUNTER — Encounter (HOSPITAL_COMMUNITY)
Admission: RE | Disposition: A | Discharge status: Home / Self Care | Payer: Self-pay | Source: Home / Self Care | Attending: Thoracic Surgery (Cardiothoracic Vascular Surgery)

## 2019-08-06 ENCOUNTER — Ambulatory Visit (HOSPITAL_COMMUNITY): Payer: Medicare Other | Admitting: Anesthesiology

## 2019-08-06 ENCOUNTER — Encounter (HOSPITAL_COMMUNITY): Payer: Self-pay | Admitting: Thoracic Surgery (Cardiothoracic Vascular Surgery)

## 2019-08-06 DIAGNOSIS — K219 Gastro-esophageal reflux disease without esophagitis: Secondary | ICD-10-CM | POA: Diagnosis not present

## 2019-08-06 DIAGNOSIS — Z79899 Other long term (current) drug therapy: Secondary | ICD-10-CM | POA: Insufficient documentation

## 2019-08-06 DIAGNOSIS — D630 Anemia in neoplastic disease: Secondary | ICD-10-CM | POA: Diagnosis not present

## 2019-08-06 DIAGNOSIS — C3411 Malignant neoplasm of upper lobe, right bronchus or lung: Secondary | ICD-10-CM | POA: Diagnosis not present

## 2019-08-06 DIAGNOSIS — Z483 Aftercare following surgery for neoplasm: Secondary | ICD-10-CM | POA: Diagnosis not present

## 2019-08-06 DIAGNOSIS — J9382 Other air leak: Secondary | ICD-10-CM

## 2019-08-06 DIAGNOSIS — R0602 Shortness of breath: Secondary | ICD-10-CM | POA: Diagnosis not present

## 2019-08-06 DIAGNOSIS — Z791 Long term (current) use of non-steroidal anti-inflammatories (NSAID): Secondary | ICD-10-CM | POA: Insufficient documentation

## 2019-08-06 DIAGNOSIS — M1990 Unspecified osteoarthritis, unspecified site: Secondary | ICD-10-CM | POA: Diagnosis not present

## 2019-08-06 DIAGNOSIS — G4700 Insomnia, unspecified: Secondary | ICD-10-CM | POA: Diagnosis not present

## 2019-08-06 DIAGNOSIS — J449 Chronic obstructive pulmonary disease, unspecified: Secondary | ICD-10-CM | POA: Diagnosis not present

## 2019-08-06 DIAGNOSIS — G709 Myoneural disorder, unspecified: Secondary | ICD-10-CM | POA: Insufficient documentation

## 2019-08-06 DIAGNOSIS — Y838 Other surgical procedures as the cause of abnormal reaction of the patient, or of later complication, without mention of misadventure at the time of the procedure: Secondary | ICD-10-CM | POA: Diagnosis not present

## 2019-08-06 DIAGNOSIS — R519 Headache, unspecified: Secondary | ICD-10-CM | POA: Diagnosis not present

## 2019-08-06 DIAGNOSIS — G2581 Restless legs syndrome: Secondary | ICD-10-CM | POA: Diagnosis not present

## 2019-08-06 DIAGNOSIS — I10 Essential (primary) hypertension: Secondary | ICD-10-CM | POA: Diagnosis not present

## 2019-08-06 DIAGNOSIS — J95812 Postprocedural air leak: Secondary | ICD-10-CM | POA: Diagnosis not present

## 2019-08-06 DIAGNOSIS — Z87891 Personal history of nicotine dependence: Secondary | ICD-10-CM | POA: Insufficient documentation

## 2019-08-06 DIAGNOSIS — I739 Peripheral vascular disease, unspecified: Secondary | ICD-10-CM | POA: Diagnosis not present

## 2019-08-06 DIAGNOSIS — C3491 Malignant neoplasm of unspecified part of right bronchus or lung: Secondary | ICD-10-CM

## 2019-08-06 DIAGNOSIS — I70213 Atherosclerosis of native arteries of extremities with intermittent claudication, bilateral legs: Secondary | ICD-10-CM | POA: Diagnosis not present

## 2019-08-06 HISTORY — PX: VIDEO BRONCHOSCOPY WITH INSERTION OF INTERBRONCHIAL VALVE (IBV): SHX6178

## 2019-08-06 SURGERY — BRONCHOSCOPY, FLEXIBLE, WITH INTRABRONCHIAL VALVE INSERTION
Anesthesia: General

## 2019-08-06 MED ORDER — ACETAMINOPHEN 500 MG PO TABS: 1000.0000 mg | ORAL_TABLET | Freq: Once | ORAL | Status: DC

## 2019-08-06 MED ORDER — PROPOFOL 10 MG/ML IV BOLUS
INTRAVENOUS | Status: AC
  Filled 2019-08-06: qty 20

## 2019-08-06 MED ORDER — LACTATED RINGERS IV SOLN: INTRAVENOUS | Status: DC

## 2019-08-06 MED ORDER — 0.9 % SODIUM CHLORIDE (POUR BTL) OPTIME
TOPICAL | Status: DC | PRN
  Administered 2019-08-06: 1000 mL

## 2019-08-06 MED ORDER — ONDANSETRON HCL 4 MG/2ML IJ SOLN: 4.0000 mg | Freq: Once | INTRAMUSCULAR | Status: DC | PRN

## 2019-08-06 MED ORDER — LIDOCAINE 2% (20 MG/ML) 5 ML SYRINGE
INTRAMUSCULAR | Status: DC | PRN
  Administered 2019-08-06: 80 mg via INTRAVENOUS

## 2019-08-06 MED ORDER — ROCURONIUM BROMIDE 10 MG/ML (PF) SYRINGE
PREFILLED_SYRINGE | INTRAVENOUS | Status: DC | PRN
  Administered 2019-08-06: 40 mg via INTRAVENOUS

## 2019-08-06 MED ORDER — DEXAMETHASONE SODIUM PHOSPHATE 10 MG/ML IJ SOLN
INTRAMUSCULAR | Status: DC | PRN
  Administered 2019-08-06: 10 mg via INTRAVENOUS

## 2019-08-06 MED ORDER — MIDAZOLAM HCL 5 MG/5ML IJ SOLN
INTRAMUSCULAR | Status: DC | PRN
  Administered 2019-08-06: 1 mg via INTRAVENOUS

## 2019-08-06 MED ORDER — PHENYLEPHRINE HCL-NACL 10-0.9 MG/250ML-% IV SOLN
INTRAVENOUS | Status: DC | PRN
  Administered 2019-08-06: 50 ug/min via INTRAVENOUS

## 2019-08-06 MED ORDER — ACETAMINOPHEN 500 MG PO TABS
1000.0000 mg | ORAL_TABLET | Freq: Once | ORAL | Status: AC
  Administered 2019-08-06: 1000 mg via ORAL
  Filled 2019-08-06: qty 2

## 2019-08-06 MED ORDER — FENTANYL CITRATE (PF) 250 MCG/5ML IJ SOLN
INTRAMUSCULAR | Status: DC | PRN
  Administered 2019-08-06: 50 ug via INTRAVENOUS

## 2019-08-06 MED ORDER — MIDAZOLAM HCL 2 MG/2ML IJ SOLN
INTRAMUSCULAR | Status: AC
  Filled 2019-08-06: qty 2

## 2019-08-06 MED ORDER — ORAL CARE MOUTH RINSE: 15.0000 mL | Freq: Once | OROMUCOSAL | Status: AC

## 2019-08-06 MED ORDER — ONDANSETRON HCL 4 MG/2ML IJ SOLN
INTRAMUSCULAR | Status: DC | PRN
  Administered 2019-08-06: 4 mg via INTRAVENOUS

## 2019-08-06 MED ORDER — SUGAMMADEX SODIUM 200 MG/2ML IV SOLN
INTRAVENOUS | Status: DC | PRN
  Administered 2019-08-06: 300 mg via INTRAVENOUS

## 2019-08-06 MED ORDER — FENTANYL CITRATE (PF) 100 MCG/2ML IJ SOLN: 25.0000 ug | INTRAMUSCULAR | Status: DC | PRN

## 2019-08-06 MED ORDER — CHLORHEXIDINE GLUCONATE 0.12 % MT SOLN
15.0000 mL | Freq: Once | OROMUCOSAL | Status: AC
  Administered 2019-08-06: 15 mL via OROMUCOSAL
  Filled 2019-08-06: qty 15

## 2019-08-06 MED ORDER — FENTANYL CITRATE (PF) 250 MCG/5ML IJ SOLN
INTRAMUSCULAR | Status: AC
  Filled 2019-08-06: qty 5

## 2019-08-06 MED ORDER — EPINEPHRINE PF 1 MG/ML IJ SOLN
INTRAMUSCULAR | Status: AC
  Filled 2019-08-06: qty 1

## 2019-08-06 MED ORDER — PROPOFOL 10 MG/ML IV BOLUS
INTRAVENOUS | Status: DC | PRN
  Administered 2019-08-06: 100 mg via INTRAVENOUS

## 2019-08-06 SURGICAL SUPPLY — 35 items
ADAPTER VALVE BIOPSY EBUS (MISCELLANEOUS) IMPLANT
ADPTR VALVE BIOPSY EBUS (MISCELLANEOUS)
BLADE CLIPPER SURG (BLADE) IMPLANT
CANISTER SUCT 3000ML PPV (MISCELLANEOUS) ×3 IMPLANT
CNTNR URN SCR LID CUP LEK RST (MISCELLANEOUS) ×1 IMPLANT
CONT SPEC 4OZ STRL OR WHT (MISCELLANEOUS) ×3
COVER BACK TABLE 60X90IN (DRAPES) ×3 IMPLANT
FILTER STRAW FLUID ASPIR (MISCELLANEOUS) IMPLANT
FORCEPS BIOP RJ4 1.8 (CUTTING FORCEPS) IMPLANT
FORCEPS RADIAL JAW LRG 4 PULM (INSTRUMENTS) ×1 IMPLANT
GAUZE SPONGE 4X4 12PLY STRL (GAUZE/BANDAGES/DRESSINGS) ×3 IMPLANT
GLOVE SURG SIGNA 7.5 PF LTX (GLOVE) ×3 IMPLANT
GOWN STRL REUS W/ TWL XL LVL3 (GOWN DISPOSABLE) ×1 IMPLANT
GOWN STRL REUS W/TWL XL LVL3 (GOWN DISPOSABLE) ×3
KIT CLEAN ENDO COMPLIANCE (KITS) ×3 IMPLANT
KIT TURNOVER KIT B (KITS) ×3 IMPLANT
MARKER SKIN DUAL TIP RULER LAB (MISCELLANEOUS) ×3 IMPLANT
NS IRRIG 1000ML POUR BTL (IV SOLUTION) ×3 IMPLANT
OIL SILICONE PENTAX (PARTS (SERVICE/REPAIRS)) IMPLANT
PAD ARMBOARD 7.5X6 YLW CONV (MISCELLANEOUS) ×6 IMPLANT
RADIAL JAW LRG 4 PULMONARY (INSTRUMENTS) ×2
STOPCOCK MORSE 400PSI 3WAY (MISCELLANEOUS) ×3 IMPLANT
SYR 10ML LL (SYRINGE) ×3 IMPLANT
SYR 20ML ECCENTRIC (SYRINGE) ×3 IMPLANT
TOWEL GREEN STERILE (TOWEL DISPOSABLE) ×3 IMPLANT
TOWEL GREEN STERILE FF (TOWEL DISPOSABLE) ×3 IMPLANT
TOWEL NATURAL 4PK STERILE (DISPOSABLE) ×3 IMPLANT
TRAP SPECIMEN MUCUS 40CC (MISCELLANEOUS) ×3 IMPLANT
TUBE CONNECTING 20'X1/4 (TUBING) ×1
TUBE CONNECTING 20X1/4 (TUBING) ×2 IMPLANT
UNDERPAD 30X36 HEAVY ABSORB (UNDERPADS AND DIAPERS) ×3 IMPLANT
VALVE BIOPSY  SINGLE USE (MISCELLANEOUS) ×3
VALVE BIOPSY SINGLE USE (MISCELLANEOUS) ×1 IMPLANT
VALVE SUCTION BRONCHIO DISP (MISCELLANEOUS) ×3 IMPLANT
WATER STERILE IRR 1000ML POUR (IV SOLUTION) ×3 IMPLANT

## 2019-08-06 NOTE — Transfer of Care (Signed)
Immediate Anesthesia Transfer of Care Note  Patient: Luis [name]  Procedure(s) Performed: VIDEO BRONCHOSCOPY WITH REMOVAL OF INTERBRONCHIAL VALVE (IBV) (N/A )  Patient Location: PACU  Anesthesia Type:General  Level of Consciousness: awake, alert  and oriented  Airway & Oxygen Therapy: Patient Spontanous Breathing  Post-op Assessment: Report given to RN, Post -op Vital signs reviewed and stable and Patient moving all extremities X 4  Post vital signs: Reviewed and stable  Last Vitals:  Vitals Value Taken Time  BP 132/80 08/06/19 0805  Temp    Pulse 78 08/06/19 0808  Resp 19 08/06/19 0808  SpO2 96 % 08/06/19 0808  Vitals shown include unvalidated device data.  Last Pain:  Vitals:   08/06/19 0559  TempSrc: Oral  PainSc: 2       Patients Stated Pain Goal: 3 (03/88/82 8003)  Complications: No complications documented.

## 2019-08-06 NOTE — Brief Op Note (Signed)
08/06/2019  8:08 AM  PATIENT:  Luis [name]  69 y.o. male  PRE-OPERATIVE DIAGNOSIS:  IBV for air leak  POST-OPERATIVE DIAGNOSIS:  Removal IBV valve  PROCEDURE:  Procedure(s): VIDEO BRONCHOSCOPY WITH REMOVAL OF INTERBRONCHIAL VALVE (IBV) (N/A) x 2  SURGEON:  Surgeon(s) and Role:    * [name], MD - Primary  PHYSICIAN ASSISTANT:   ASSISTANTS: none   ANESTHESIA:   general  EBL:  minimal  BLOOD ADMINISTERED:none  DRAINS: none   LOCAL MEDICATIONS USED:  NONE  SPECIMEN:  Source of Specimen:  IBV x 2  DISPOSITION OF SPECIMEN:  PATHOLOGY  COUNTS:  NO endo  TOURNIQUET:  * No tourniquets in log *  DICTATION: .Other Dictation: Dictation Number -  PLAN OF CARE: Discharge to home after PACU  PATIENT DISPOSITION:  PACU - hemodynamically stable.   Delay start of Pharmacological VTE agent (>24hrs) due to surgical blood loss or risk of bleeding: not applicable

## 2019-08-06 NOTE — Op Note (Signed)
NAME: Luis [name], Luis [name] MEDICAL RECORD [id number] ACCOUNT [id number] DATE OF BIRTH:[birth year] FACILITY: [facility] LOCATION: [facility] PHYSICIAN:Sayde Lish Chaya Jan, MD  OPERATIVE REPORT  DATE OF PROCEDURE:  08/06/2019  PREOPERATIVE DIAGNOSIS:  Intrabronchial valves placed for persistent air leak after lobectomy.  POSTOPERATIVE DIAGNOSIS:  Intrabronchial valves placed for persistent air leak after lobectomy.  PROCEDURE:  Bronchoscopy for removal of intrabronchial valve.  SURGEON:  Modesto Charon, MD  ASSISTANT:  None  ANESTHESIA:  General.  FINDINGS:  Valves removed without difficulty.  Minimal granulation tissue, minimal bleeding with valve removal.  CLINICAL NOTE:  [name] is a 69 year old gentleman who had undergone a robotic right upper lobectomy previously.  He had a persistent air leak postoperatively and had intrabronchial valves placed in the lower lobe bronchi.  Two valves were placed.  He  ultimately required redo VATS, but his air leak resolved and he now returns for removal of the intrabronchial valves.  Indications, risks, benefits, and alternatives were discussed in detail with the patient.  He accepted the risks and agreed to proceed.  OPERATIVE NOTE:  The patient was brought to the operating room on 08/06/2019.  He had induction of general anesthesia.  A timeout was performed.  Flexible fiberoptic bronchoscopy was performed via the endotracheal tube.  The right upper lobe bronchial  stump was well healed.  The valves were easily visible in the basilar and superior segmental airways.  The remainder of the tracheobronchial tree was within normal limits.  Biopsy forceps was used to grasp the valve in the basilar segmental bronchus and  it was removed with minimal resistance.  Next, the valve was removed from the superior segmental bronchus and also was removed with minimal resistance.  There was minimal granulation tissue and very small amount of blood after removal of  the valves.   This was irrigated with saline.  The patient then was extubated in the operating room and taken to the [place] [facility] in good condition.  CN/NUANCE  D:08/06/2019 T:08/06/2019 JOB:[id number]

## 2019-08-06 NOTE — Anesthesia Procedure Notes (Signed)
Procedure Name: Intubation Date/Time: 08/06/2019 7:42 AM Performed by: Mariea Clonts, CRNA Pre-anesthesia Checklist: Patient identified, Emergency Drugs available, Suction available and Patient being monitored Patient Re-evaluated:Patient Re-evaluated prior to induction Oxygen Delivery Method: Circle System Utilized Preoxygenation: Pre-oxygenation with 100% oxygen Induction Type: IV induction Ventilation: Mask ventilation without difficulty Laryngoscope Size: Miller and 2 Grade View: Grade I Tube type: Oral Tube size: 8.5 mm Number of attempts: 1 Airway Equipment and Method: Stylet and Oral airway Placement Confirmation: ETT inserted through vocal cords under direct vision,  positive ETCO2 and breath sounds checked- equal and bilateral Tube secured with: Tape Dental Injury: Teeth and Oropharynx as per pre-operative assessment

## 2019-08-06 NOTE — Discharge Instructions (Addendum)
Do not drive or engage in heavy physical activity for 24 hours  You may resume normal activities tomorrow  You may drive tomorrow  You may cough up small amounts of blood over the next couple of days  You may use acetaminophen(Tylenol) if needed for discomfort. You may use the over the counter cough medication of your choice if needed  Call [phone] if you develop chest pain, shortness of breath, fever > 101F or cough up more than a tablespoon of blood  My office will contact you with a follow up appointment

## 2019-08-06 NOTE — Anesthesia Postprocedure Evaluation (Signed)
Anesthesia Post Note  Patient: Luis [name]  Procedure(s) Performed: VIDEO BRONCHOSCOPY WITH REMOVAL OF INTERBRONCHIAL VALVE (IBV) (N/A )     Patient location during evaluation: PACU Anesthesia Type: General Level of consciousness: awake and alert, awake and oriented Pain management: pain level controlled Vital Signs Assessment: post-procedure vital signs reviewed and stable Respiratory status: spontaneous breathing, nonlabored ventilation, respiratory function stable and patient connected to nasal cannula oxygen Cardiovascular status: blood pressure returned to baseline and stable Postop Assessment: no apparent nausea or vomiting Anesthetic complications: no   No complications documented.  Last Vitals:  Vitals:   08/06/19 0835 08/06/19 0850  BP: (!) 138/80 126/80  Pulse: 75 73  Resp: 13 16  Temp:  36.4 C  SpO2: 95% 94%    Last Pain:  Vitals:   08/06/19 0850  TempSrc:   PainSc: 0-No pain                 Catalina Gravel

## 2019-08-06 NOTE — Interval H&P Note (Signed)
History and Physical Interval Note:  08/06/2019 7:13 AM  Luis [name]  has presented today for surgery, with the diagnosis of IBV for air leak.  The various methods of treatment have been discussed with the patient and family. After consideration of risks, benefits and other options for treatment, the patient has consented to  Procedure(s): VIDEO BRONCHOSCOPY WITH REMOVAL OF INTERBRONCHIAL VALVE (IBV) (N/A) as a surgical intervention.  The patient's history has been reviewed, patient examined, no change in status, stable for surgery.  I have reviewed the patient's chart and labs.  Questions were answered to the patient's satisfaction.     Luis [name]

## 2019-08-06 NOTE — Patient Outreach (Signed)
[facility]) Care Management  [facility] Care Manager  08/06/2019   Luis [name] July 15, 1950 [id number] Telephone assessment with wife on 08/05/2019 Subjective: Reviewed current assessment information with wife.  Patient to have a procedure on 08/06/2019 to remove lung valve.  Patient is improving according to wife. Continues to be weak. Wife reports patient is eating and drinking better.  Wife states tumor was sent off for genetic testing to determine best treatment plan. Wife reports decrease in anxiety with the celexa.   Objective:  Vitals:   08/05/19 1333  Weight: 125 lb (56.7 kg)  Height: 1.676 m (5' 6" )      Encounter Medications:  Outpatient Encounter Medications as of 08/05/2019  Medication Sig  . acetaminophen (TYLENOL) 650 MG CR tablet Take 650 mg by mouth 2 (two) times daily as needed for pain.  . Cholecalciferol (VITAMIN D) 125 MCG (5000 UT) CAPS Take 5,000 Units by mouth daily.  . citalopram (CELEXA) 20 MG tablet Take 20 mg by mouth daily.  [name] docusate sodium (COLACE) 100 MG capsule Take 100-200 mg by mouth 2 (two) times daily as needed for mild constipation.  [name] guaiFENesin (MUCINEX) 600 MG 12 hr tablet Take 1 tablet (600 mg total) by mouth 2 (two) times daily as needed for cough or to loosen phlegm.  . hydrocortisone cream 1 % Apply 1 application topically daily. Skin dermatitis  . pantoprazole (PROTONIX) 20 MG tablet Take 20 mg by mouth every evening.  . Tiotropium Bromide-Olodaterol (STIOLTO RESPIMAT) 2.5-2.5 MCG/ACT AERS Inhale 2 puffs into the lungs daily.  . valsartan-hydrochlorothiazide (DIOVAN-HCT) 80-12.5 MG tablet Take 1 tablet by mouth daily.  . vitamin B-12 (CYANOCOBALAMIN) 1000 MCG tablet Take 2,000 mcg by mouth daily.  . [DISCONTINUED] meloxicam (MOBIC) 7.5 MG tablet Take 7.5 mg by mouth daily as needed for pain. (Patient not taking: Reported on 08/05/2019)  . [DISCONTINUED] traMADol (ULTRAM) 50 MG tablet Take 1 tablet (50 mg total) by mouth every 6  (six) hours as needed for moderate pain. (Patient not taking: Reported on 07/22/2019)   No facility-administered encounter medications on file as of 08/05/2019.    Functional Status:  In your present state of health, do you have any difficulty performing the following activities: 08/05/2019 08/04/2019  Hearing? Y N  Comment HOH but does not want to have testing done. -  Vision? Y N  Comment readers -  Difficulty concentrating or making decisions? N N  Walking or climbing stairs? N N  Dressing or bathing? N N  Doing errands, shopping? - Scientist, forensic and eating ? N -  Using the Toilet? N -  In the past six months, have you accidently leaked urine? N -  Do you have problems with loss of bowel control? N -  Managing your Medications? Y -  Comment wife manages medications -  Managing your Finances? N -  Housekeeping or managing your Housekeeping? Y -  Comment wife manages -  Some recent data might be hidden    Fall/Depression Screening: No flowsheet data found. PHQ 2/9 Scores 08/05/2019  PHQ - 2 Score 1    Assessment:  (1)New diagnosis of lung cancer. Pending treatment (2) COPD:  on inhalers which are costly.   (3) hard of hearing (4) no advance directives (5) recent long inpatient stay for lung surgery and complications. (6) recent issues with anxiety  Plan:  (1) encouraged patient to keep scheduled follow up appointments.  (2) Referral to pharmacy to for cost of inhalers.  (  3) encouraged hearing testing (4)will mail advanced directive packet (5)MD office does transition of care.  Will continue to follow and offered education and support.  (6) anxiety is decreasing with start of celexa.   Will plan follow up in 1 week.   THN CM Care Plan Problem One     Most Recent Value  Care Plan Problem One Recent prolonged admission for lung cancer and COPD  Role Documenting the Problem One Care Management Coordinator  Care Plan for Problem One Active  THN Long Term Goal   Patient and or caregiver will report no readmissions for the next 60 days.   THN Long Term Goal Start Date 07/13/19  Interventions for Problem One Long Term Goal Reviewed curent progress and outpatient surgery plans.   [facility] CM Short Term Goal #1  Patient and or wife will verbalize decreased anxiety in the next 30 days.   [facility] CM Short Term Goal #1 Start Date 07/14/19  [facility] CM Short Term Goal #1 Met Date 08/06/19  THN CM Short Term Goal #2  Patient and or wife will report improve appetite in the next 30 days   THN CM Short Term Goal #2 Start Date 07/13/19  [facility] CM Short Term Goal #2 Met Date 08/06/19      Tomasa Rand, RN, BSN, CEN [place] Coordinator [phone]

## 2019-08-06 NOTE — Research (Signed)
ALCHEMIST [id number]: ADJUVANT LUNG CANCER ENRICHMENT MARKER IDENTIFICATION AND SEQUENCING TRIAL  08/06/2019 9:00AM  TISSUE BLOCK CONFIRMATION: Requested tissue block after signing consent form on 08/03/19. Tissue was received today, 08/06/19 at 9AM. I registered the patient in OPEN once we received tissue block. Shipping will occur and will be confirmed.   [place] Coordinator 08/06/19 12:54PM

## 2019-08-06 NOTE — Addendum Note (Signed)
Addended by: Thana Ates on: 08/06/2019 04:23 PM   Modules accepted: Orders

## 2019-08-07 ENCOUNTER — Encounter (HOSPITAL_COMMUNITY): Payer: Self-pay | Admitting: Thoracic Surgery (Cardiothoracic Vascular Surgery)

## 2019-08-11 ENCOUNTER — Other Ambulatory Visit: Payer: Self-pay | Admitting: *Deleted

## 2019-08-11 ENCOUNTER — Encounter: Payer: Self-pay | Admitting: Radiology

## 2019-08-11 DIAGNOSIS — Z006 Encounter for examination for normal comparison and control in clinical research program: Secondary | ICD-10-CM

## 2019-08-12 ENCOUNTER — Other Ambulatory Visit: Payer: Self-pay

## 2019-08-13 NOTE — Patient Outreach (Signed)
[place] [facility]) Care Management  08/13/2019  Luis [name] Mar 03, 1950 [id number]   Telephone assessment:  Placed call to patient and spoke with wife who reports procedure went very well with no problems.  Wife reports she has received the advanced directives in the mail.  Reports patient continues to complain that he does not feel good but is unable to be specific.  Wife reports a lot of reflux and MD increased Protonix to twice a day.  Wife reports patient is napping a lot, complains of soreness. Eating and drinking well.    PLAN: reviewed with wife to call primary MD to have patient evaluated for lack of energy and generally not feeling well. Reviewed concern for depression. Reviewed importance of completing advanced directives.  Will plan follow up in 1 week.    [facility] CM Care Plan Problem One     Most Recent Value  Care Plan Problem One Recent prolonged admission for lung cancer and COPD  Role Documenting the Problem One Care Management Coordinator  Care Plan for Problem One Active  THN Long Term Goal  Patient and or caregiver will report no readmissions for the next 60 days.   THN Long Term Goal Start Date 07/13/19  Interventions for Problem One Long Term Goal Reviewed current concerns for lack of energy. reviewed importance of good nutrition and hydration. Enouraged follow up appointment with primary MD.   [facility] CM Short Term Goal #1  Patient and or wife will verbalize decreased anxiety in the next 30 days.   [facility] CM Short Term Goal #1 Start Date 07/14/19  [facility] CM Short Term Goal #1 Met Date 08/06/19  [facility] CM Short Term Goal #2  Patient and or wife will report improve appetite in the next 30 days   [facility]N CM Short Term Goal #2 Start Date 07/13/19  [facility] CM Short Term Goal #2 Met Date 08/06/19       Tomasa Rand, RN, BSN, CEN [place] Coordinator [phone]

## 2019-08-17 ENCOUNTER — Inpatient Hospital Stay: Payer: Medicare Other | Admitting: Internal Medicine

## 2019-08-17 ENCOUNTER — Encounter: Payer: Self-pay | Admitting: Thoracic Surgery (Cardiothoracic Vascular Surgery)

## 2019-08-17 ENCOUNTER — Telehealth: Payer: Self-pay | Admitting: Internal Medicine

## 2019-08-17 NOTE — Telephone Encounter (Signed)
Scheduled appt per 8/2 sch msg - unable to reach pt - left message for patient with appt date and time

## 2019-08-19 ENCOUNTER — Other Ambulatory Visit: Payer: Self-pay

## 2019-08-20 NOTE — Patient Outreach (Signed)
[place] [facility]) Care Management  08/20/2019  Luis [name] 1950/05/27 [id number]   Telephone assessment:  ( phone call on 08/19/2019)  Placed call to patient and wife. ( both on speaker phone) Patient and wife report he is doing great. More active. Has been doing activities with wife.  Continues to take daily naps.   Follow up planned at cancer center in 1 week.  PLAN: reviewed reasons to call MD. Will plan follow up in 3 weeks.  Tomasa Rand, RN, BSN, CEN [facility] [employer] [phone]

## 2019-08-26 ENCOUNTER — Inpatient Hospital Stay: Payer: Medicare Other | Attending: Internal Medicine | Admitting: Internal Medicine

## 2019-08-26 ENCOUNTER — Encounter: Payer: Self-pay | Admitting: *Deleted

## 2019-08-26 ENCOUNTER — Encounter: Payer: Self-pay | Admitting: Internal Medicine

## 2019-08-26 ENCOUNTER — Other Ambulatory Visit: Payer: Self-pay

## 2019-08-26 ENCOUNTER — Inpatient Hospital Stay: Payer: Medicare Other

## 2019-08-26 VITALS — BP 129/96 | HR 93 | Temp 96.9°F | Resp 18 | Ht 66.0 in | Wt 131.4 lb

## 2019-08-26 DIAGNOSIS — C3491 Malignant neoplasm of unspecified part of right bronchus or lung: Secondary | ICD-10-CM | POA: Diagnosis not present

## 2019-08-26 DIAGNOSIS — C3411 Malignant neoplasm of upper lobe, right bronchus or lung: Secondary | ICD-10-CM | POA: Insufficient documentation

## 2019-08-26 DIAGNOSIS — K219 Gastro-esophageal reflux disease without esophagitis: Secondary | ICD-10-CM | POA: Diagnosis not present

## 2019-08-26 DIAGNOSIS — Z5111 Encounter for antineoplastic chemotherapy: Secondary | ICD-10-CM | POA: Diagnosis not present

## 2019-08-26 DIAGNOSIS — J449 Chronic obstructive pulmonary disease, unspecified: Secondary | ICD-10-CM | POA: Diagnosis not present

## 2019-08-26 DIAGNOSIS — M1990 Unspecified osteoarthritis, unspecified site: Secondary | ICD-10-CM | POA: Diagnosis not present

## 2019-08-26 DIAGNOSIS — Z7189 Other specified counseling: Secondary | ICD-10-CM

## 2019-08-26 DIAGNOSIS — I10 Essential (primary) hypertension: Secondary | ICD-10-CM | POA: Diagnosis not present

## 2019-08-26 DIAGNOSIS — Z79899 Other long term (current) drug therapy: Secondary | ICD-10-CM | POA: Insufficient documentation

## 2019-08-26 DIAGNOSIS — B370 Candidal stomatitis: Secondary | ICD-10-CM | POA: Diagnosis not present

## 2019-08-26 DIAGNOSIS — R109 Unspecified abdominal pain: Secondary | ICD-10-CM | POA: Diagnosis not present

## 2019-08-26 DIAGNOSIS — K30 Functional dyspepsia: Secondary | ICD-10-CM | POA: Diagnosis not present

## 2019-08-26 DIAGNOSIS — R066 Hiccough: Secondary | ICD-10-CM | POA: Insufficient documentation

## 2019-08-26 MED ORDER — CYANOCOBALAMIN 1000 MCG/ML IJ SOLN
1000.0000 ug | Freq: Once | INTRAMUSCULAR | Status: AC
  Administered 2019-08-26: 1000 ug via INTRAMUSCULAR

## 2019-08-26 MED ORDER — PROCHLORPERAZINE MALEATE 10 MG PO TABS
10.0000 mg | ORAL_TABLET | Freq: Four times a day (QID) | ORAL | 0 refills | Status: DC | PRN
Start: 2019-08-26 — End: 2019-09-06

## 2019-08-26 MED ORDER — CYANOCOBALAMIN 1000 MCG/ML IJ SOLN
INTRAMUSCULAR | Status: AC
  Filled 2019-08-26: qty 1

## 2019-08-26 MED ORDER — FOLIC ACID 1 MG PO TABS
1.0000 mg | ORAL_TABLET | Freq: Every day | ORAL | 4 refills | Status: DC
Start: 2019-08-26 — End: 2019-11-22

## 2019-08-26 MED ORDER — DEXAMETHASONE 4 MG PO TABS
ORAL_TABLET | ORAL | 0 refills | Status: DC
Start: 2019-08-26 — End: 2019-12-28

## 2019-08-26 NOTE — Research (Signed)
08/26/19 at 4:10pm - DCP-001 study notes- The pt was into the [facility] for his MD appt this afternoon.  The research nurse met with the pt before his MD visit to discuss the DCP study.  The pt and his wife said they have not read the DCP consent form yet.  The research nurse explained to the pt that the DCP-001 is a "one time collection of information to better understand the clinical trial participation process".  The research nurse furthered explained to the pt that "no information collected will be used to identify the patient".  The research nurse stated that a member of the research team would see the pt next week when he comes in for his appointments to see if he wants to participate in the study.  The pt was encouraged to read over the consent form and hipaa form at home.  The pt was thanked for his continued interest in the DCP-001 study.  Brion Aliment RN, BSN, CCRP Clinical Research Nurse 08/26/2019 4:22 PM

## 2019-08-26 NOTE — Progress Notes (Signed)
START ON PATHWAY REGIMEN - Non-Small Cell Lung     A cycle is every 21 days:     Pemetrexed      Cisplatin   **Always confirm dose/schedule in your pharmacy ordering system**  Patient Characteristics: Postoperative without Neoadjuvant Therapy (Pathologic Staging), Stage IIB, Adjuvant Chemotherapy, Nonsquamous Cell Therapeutic Status: Postoperative without Neoadjuvant Therapy (Pathologic Staging) AJCC T Category: pT1b AJCC N Category: pN1 AJCC M Category: cM0 AJCC 8 Stage Grouping: IIB Histology: Nonsquamous Cell Intent of Therapy: Curative Intent, Discussed with Patient

## 2019-08-26 NOTE — Progress Notes (Signed)
[place] [phone]   [phone]  OFFICE PROGRESS NOTE  [name], MD [address] [place]  [id number]  DIAGNOSIS: Stage IIB (T1b, N1, M0) non-small cell lung cancer, invasive poorly differentiated carcinoma diagnosed in June 2021. Molecular studies are negative for EGFR mutation as well as ALK gene translocation.  PD-L1 expression was 50-100%.  This is based on the report from the Alchemist trial  PRIOR THERAPY: Status post right upper lobectomy with lymph node dissection on June 17, 2019 under the care of Dr. Roxan Hockey.  CURRENT THERAPY: Adjuvant systemic chemotherapy with cisplatin 75 mg/M2 and Alimta 500 mg/M2 every 3 weeks.  First dose September 02, 2019.  INTERVAL HISTORY: Luis [name] 69 y.o. male returns to the clinic today for follow-up visit accompanied by his wife.  The patient is feeling fine today with no concerning complaints except for occasional abdominal pain.  He denied having any chest pain, shortness of breath, cough or hemoptysis.  He denied having any fever or chills.  He has no nausea, vomiting, diarrhea or constipation.  He denied having any headache or visual changes.  He has no recent weight loss or night sweats.  He was enrolled in the alchemist clinical trial and the molecular studies showed negative for EGFR mutation as well as ALK gene translocation but he has PD-L1 expression in the range of 50-100%.  The patient is here today for evaluation and recommendation regarding treatment of his condition.  MEDICAL HISTORY: Past Medical History:  Diagnosis Date  . Arthritis    through out body  . Cancer ([place])   . Chronic bronchitis ([place])   . Concussion   . COPD (chronic obstructive pulmonary disease) ([place])   . Dyspnea   . Emphysema lung ([place])   . Frozen shoulder    LEFT  . GERD (gastroesophageal reflux disease)   . Hard of hearing   . Headache    alot of days, related to spine issues  .  Hypertension   . Lumbar radiculopathy 2013  . Peyronie's disease   . Prostatitis 2011   Dr. Gaynelle Arabian  . RLS (restless legs syndrome)   . Shingles 06/13/2018   shingles on back, finished with prednisone, stills has scabs and pain  . Thigh pain    Bilateral Thigh  . Thigh pain 10/13   bilateral  . Tobacco abuse 2010   still uses electronic cig/ emphysema, SOB easily  . Wears partial dentures    upper    ALLERGIES:  is allergic to doxycycline and tramadol.  MEDICATIONS:  Current Outpatient Medications  Medication Sig Dispense Refill  . acetaminophen (TYLENOL) 650 MG CR tablet Take 650 mg by mouth 2 (two) times daily as needed for pain.    . Cholecalciferol (VITAMIN D) 125 MCG (5000 UT) CAPS Take 5,000 Units by mouth daily.    . citalopram (CELEXA) 20 MG tablet Take 20 mg by mouth daily.    [name] docusate sodium (COLACE) 100 MG capsule Take 100-200 mg by mouth 2 (two) times daily as needed for mild constipation.    [name] guaiFENesin (MUCINEX) 600 MG 12 hr tablet Take 1 tablet (600 mg total) by mouth 2 (two) times daily as needed for cough or to loosen phlegm.    . hydrocortisone cream 1 % Apply 1 application topically daily. Skin dermatitis    . pantoprazole (PROTONIX) 20 MG tablet Take 20 mg by mouth every evening.    . Tiotropium Bromide-Olodaterol (  STIOLTO RESPIMAT) 2.5-2.5 MCG/ACT AERS Inhale 2 puffs into the lungs daily. 4 g 11  . valsartan-hydrochlorothiazide (DIOVAN-HCT) 80-12.5 MG tablet Take 1 tablet by mouth daily.    . vitamin B-12 (CYANOCOBALAMIN) 1000 MCG tablet Take 2,000 mcg by mouth daily.     No current facility-administered medications for this visit.    SURGICAL HISTORY:  Past Surgical History:  Procedure Laterality Date  . CATARACT EXTRACTION W/PHACO Right 07/08/2018   Procedure: CATARACT EXTRACTION PHACO AND INTRAOCULAR LENS PLACEMENT ([place]) RIGHT;  Surgeon: Leandrew Koyanagi, MD;  Location: [place];  Service: Ophthalmology;  Laterality: Right;  .  CATARACT EXTRACTION W/PHACO Left 07/29/2018   Procedure: CATARACT EXTRACTION PHACO AND INTRAOCULAR LENS PLACEMENT ([place]) LEFT TORIC LENS;  Surgeon: Leandrew Koyanagi, MD;  Location: [place];  Service: Ophthalmology;  Laterality: Left;  . CHEST TUBE INSERTION Right 07/01/2019   Procedure: CHEST TUBE INSERTION;  Surgeon: [name], MD;  Location: [place];  Service: Thoracic;  Laterality: Right;  . COLONOSCOPY    . EYE SURGERY    . INTERCOSTAL NERVE BLOCK Right 06/17/2019   Procedure: INTERCOSTAL NERVE BLOCK;  Surgeon: [name], MD;  Location: [place];  Service: Thoracic;  Laterality: Right;  . INTERCOSTAL NERVE BLOCK Right 07/01/2019   Procedure: INTERCOSTAL NERVE BLOCK;  Surgeon: [name], MD;  Location: [place];  Service: Thoracic;  Laterality: Right;  . TONSILLECTOMY    . VIDEO ASSISTED THORACOSCOPY Right 07/01/2019   Procedure: VIDEO ASSISTED THORACOSCOPY - REMOVAL OF RIGHT MIDDLE LOBE BLEBS;  Surgeon: [name], MD;  Location: [place];  Service: Thoracic;  Laterality: Right;  [name] VIDEO BRONCHOSCOPY WITH INSERTION OF INTERBRONCHIAL VALVE (IBV) Right 06/25/2019   Procedure: VIDEO BRONCHOSCOPY WITH INSERTION OF INTERBRONCHIAL VALVE (IBV); Size 7 IBV into Right Loer Lobe (RLL) Superior Segment, Size 9 IBV into RLL Basilar Segment;  Surgeon: [name], MD;  Location: [facility];  Service: Thoracic;  Laterality: Right;  [name] VIDEO BRONCHOSCOPY WITH INSERTION OF INTERBRONCHIAL VALVE (IBV) N/A 08/06/2019   Procedure: VIDEO BRONCHOSCOPY WITH REMOVAL OF INTERBRONCHIAL VALVE (IBV);  Surgeon: [name], MD;  Location: [facility];  Service: Thoracic;  Laterality: N/A;    REVIEW OF SYSTEMS:  Constitutional: negative Eyes: negative Ears, nose, mouth, throat, and face: negative Respiratory: negative Cardiovascular: negative Gastrointestinal: positive for abdominal pain Genitourinary:negative Integument/breast: negative Hematologic/lymphatic:  negative Musculoskeletal:negative Neurological: negative Behavioral/Psych: negative Endocrine: negative Allergic/Immunologic: negative   PHYSICAL EXAMINATION: General appearance: alert, cooperative and no distress Head: Normocephalic, without obvious abnormality, atraumatic Neck: no adenopathy, no JVD, supple, symmetrical, trachea midline and thyroid not enlarged, symmetric, no tenderness/mass/nodules Lymph nodes: Cervical, supraclavicular, and axillary nodes normal. Resp: clear to auscultation bilaterally Back: symmetric, no curvature. ROM normal. No CVA tenderness. Cardio: regular rate and rhythm, S1, S2 normal, no murmur, click, rub or gallop GI: soft, non-tender; bowel sounds normal; no masses,  no organomegaly Extremities: extremities normal, atraumatic, no cyanosis or edema Neurologic: Alert and oriented X 3, normal strength and tone. Normal symmetric reflexes. Normal coordination and gait  ECOG PERFORMANCE STATUS: 1 - Symptomatic but completely ambulatory  Blood pressure (!) 129/96, pulse 93, temperature (!) 96.9 F (36.1 C), temperature source Tympanic, resp. rate 18, height 5' 6"  (1.676 m), weight 131 lb 6.4 oz (59.6 kg), SpO2 99 %.  LABORATORY DATA: Lab Results  Component Value Date   WBC 12.3 (H) 08/03/2019   HGB 14.5 08/03/2019   HCT 42.7 08/03/2019   MCV 88.2 08/03/2019   PLT 257  08/03/2019      Chemistry      Component Value Date/Time   NA 135 08/04/2019 1417   K 4.2 08/04/2019 1417   CL 98 08/04/2019 1417   CO2 28 08/04/2019 1417   BUN 9 08/04/2019 1417   CREATININE 0.98 08/04/2019 1417   CREATININE 0.92 08/03/2019 1250      Component Value Date/Time   CALCIUM 9.0 08/04/2019 1417   ALKPHOS 64 08/04/2019 1417   AST 16 08/04/2019 1417   AST 13 (L) 08/03/2019 1250   ALT 15 08/04/2019 1417   ALT 15 08/03/2019 1250   BILITOT 0.7 08/04/2019 1417   BILITOT 0.4 08/03/2019 1250       RADIOGRAPHIC STUDIES: DG Chest 2 View  Result Date:  08/05/2019 CLINICAL DATA:  Dyspnea EXAM: CHEST - 2 VIEW COMPARISON:  07/27/2019 FINDINGS: Right-sided volume loss with moderate sub pulmonic right pleural effusion is again seen and is unchanged. Endobronchial plugs are noted involving the residual right upper and probable lower lobar bronchi. Left lung is clear. No pneumothorax or pleural effusion on the left. Cardiac size is within normal limits. No acute bone abnormality. IMPRESSION: Stable examination with right-sided volume loss and moderate sub pulmonic right pleural effusion. Endobronchial plugs are unchanged. No recurrence pneumothorax. Electronically Signed   By: Fidela Salisbury MD   On: 08/05/2019 22:26   DG Chest 2 View  Result Date: 07/27/2019 CLINICAL DATA:  Postop VATS and right upper lobectomy 07/01/2019. EXAM: CHEST - 2 VIEW COMPARISON:  Radiograph 07/20/2019 and 07/06/2019.  CT 04/26/2019. FINDINGS: The heart size and mediastinal contours are stable status post right thoracotomy and upper lobe resection. Intrabronchial valves remain in place with stable volume loss and perihilar atelectasis at the right lung base. No pneumothorax. The left lung is clear. No pneumothorax or residual soft tissue emphysema. IMPRESSION: Stable postoperative chest. No acute findings. Electronically Signed   By: Richardean Sale M.D.   On: 07/27/2019 13:00    ASSESSMENT AND PLAN: This is a very pleasant 69 years old white male recently diagnosed with stage IIb (T1b, N1, M0) non-small cell lung cancer, adenocarcinoma status post right upper lobectomy with lymph node dissection under the care of Dr. Roxan Hockey on June 17, 2019. The patient had molecular studies on the alchemist clinical trial that showed negative for EGFR mutation as well as ALK gene translocation but PD-L1 expression in the range of 50-100%. I had a lengthy discussion with the patient today about his current condition and treatment options. I gave the patient the option of adjuvant treatment as a  standard of care with cisplatin 75 mg/M2 and Alimta 500 mg/M2 versus enrollment in the alliance clinical trial were The patient will be randomized to the standard treatment of care versus additional immunotherapy. After a discussion with the patient and his wife.  He elected to proceed with the standard treatment because he does not want to continue treatment for the whole year if he randomized to the immunotherapy. I discussed with the patient the adverse effect of this treatment including but not limited to alopecia, myelosuppression, nausea and vomiting, peripheral neuropathy, liver or renal dysfunction. He is expected to start the first cycle of this treatment next week. He will have a chemotherapy education class before the first dose of his treatment. The patient will receive vitamin B12 injection today. I will send prescription for folic acid 1 mg p.o. daily in addition to Compazine 10 mg p.o. every 6 hours as needed for nausea and Decadron 4 mg  p.o. twice daily the day before, day of and day after chemotherapy every 3 weeks to his pharmacy. The patient will come back for follow-up visit in 2 weeks for evaluation and management of any adverse effect of his treatment. He was advised to call immediately if he has any concerning symptoms in the interval. The patient voices understanding of current disease status and treatment options and is in agreement with the current care plan.  All questions were answered. The patient knows to call the clinic with any problems, questions or concerns. We can certainly see the patient much sooner if necessary.  The total time spent in the appointment was 50 minutes.  Disclaimer: This note was dictated with voice recognition software. Similar sounding words can inadvertently be transcribed and may not be corrected upon review.

## 2019-08-26 NOTE — Research (Signed)
08/26/19 at 2:35pm - ALLIANCE [id number] [facility]) study notes- The pt was into the [facility] to see Dr. Julien Nordmann to go over the results of his genetic test results from the ALCHEMIST study.  The pt was given a copy of his signed ALCHEMIST consent and hipaa forms from 08/03/19 for his records.  The pt's results showed that he was negative for ALK and EGFR mutations.  Dr. Julien Nordmann explained to the pt and his wife that his results show that he can only participate in the sub-study, [phone] [facility]).  Dr. Julien Nordmann went over the study briefly with the pt and his wife.  He informed them about the 3 treatment arms.  The research nurse then met with the pt and his wife for 25 minutes going over the study.  The research nurse explained in detail about each of the 3 treatment arms.  The pt was informed that his participation in this sub-study was optional.  The pt stated that he feels that it has taken him "a long time" to get over his surgeries.  He said that he was not interested in treatment beyond 3 months.  He said that he just wanted [facility] because he does not want to be treated for several months after chemo.  The pt did not want to review the potential side effects of each treatment.  He said that he was comfortable receiving the [facility], and he wanted to start next week.  His wife expressed concern about his positive lymph nodes and how long he has been recovering without any treatment.  The wife and pt were happy to hear that he could receive his [facility] treatment next week.  The research nurse offered the pt to take home and read the sub-study consent form.  The pt stated that he would like to help others, but he just wanted to participate on the main study, the ALCHEMIST study.  The nurse thanked the pt for his willingness to consider this sub-study and for his continued participation in the [place] study.  The pt agreed to have his ALCHEMIST research blood drawn next week at his scheduled lab appointment on 09/02/19. Dr. Julien Nordmann was  informed regarding the pt's decision to decline participation in the sub-study, ACCIO.   Brion Aliment RN, BSN, CCRP Clinical Research Nurse 08/26/2019 3:03 PM

## 2019-08-27 ENCOUNTER — Encounter: Payer: Self-pay | Admitting: Radiology

## 2019-08-27 ENCOUNTER — Encounter: Payer: Self-pay | Admitting: Internal Medicine

## 2019-08-27 ENCOUNTER — Telehealth: Payer: Self-pay | Admitting: *Deleted

## 2019-08-27 ENCOUNTER — Telehealth: Payer: Self-pay | Admitting: Internal Medicine

## 2019-08-27 DIAGNOSIS — C3491 Malignant neoplasm of unspecified part of right bronchus or lung: Secondary | ICD-10-CM

## 2019-08-27 NOTE — Research (Signed)
ALCHEMIST [id number]: ADJUVANT LUNG CANCER ENRICHMENT MARKER IDENTIFICATION AND SEQUENCING TRIAL  08/27/2019     1:13PM  TISSUE CONFIRMATION: Tissue results were received via e-mail on 08/20/19 (PDL-1, ALK) and 08/23/19 (EGFR). Results were forwarded to Doristine Johns, RN, Clinical Research nurse and Wilber Bihari, RN, Clinical Research nurse to screen patient for [facility] sub-study. Results were given to Dr. Julien Nordmann by Doristine Johns, RN.   PLAN: Plan is to do the blood collection for ALCHEMIST study at patient's next lab appt on 09/01/19 @ 9:30AM.   Carol Ada RT(R)(T) Clinical Research Coordinator 08/27/19   1:16PM

## 2019-08-27 NOTE — Telephone Encounter (Signed)
Scheduled per 8/12 los. Pt is aware of appt time and date.

## 2019-08-27 NOTE — Progress Notes (Signed)
Pharmacist Chemotherapy Monitoring - Initial Assessment    Anticipated start date: 09/02/2019   Regimen:  . Are orders appropriate based on the patient's diagnosis, regimen, and cycle? Yes . Does the plan date match the patient's scheduled date? Yes . Is the sequencing of drugs appropriate? Yes . Are the premedications appropriate for the patient's regimen? Yes . Prior Authorization for treatment is: Pending o If applicable, is the correct biosimilar selected based on the patient's insurance? not applicable  Organ Function and Labs: [name] Are dose adjustments needed based on the patient's renal function, hepatic function, or hematologic function? Yes . Are appropriate labs ordered prior to the start of patient's treatment? Yes . Other organ system assessment, if indicated: N/A . The following baseline labs, if indicated, have been ordered: cisplatin: K, Mg  Dose Assessment: . Are the drug doses appropriate? Yes . Are the following correct: o Drug concentrations Yes o IV fluid compatible with drug Yes o Administration routes Yes o Timing of therapy Yes . If applicable, does the patient have documented access for treatment and/or plans for port-a-cath placement? yes . If applicable, have lifetime cumulative doses been properly documented and assessed? yes Lifetime Dose Tracking  No doses have been documented on this patient for the following tracked chemicals: Doxorubicin, Epirubicin, Idarubicin, Daunorubicin, Mitoxantrone, Bleomycin, Oxaliplatin, Carboplatin, Liposomal Doxorubicin  o   Toxicity Monitoring/Prevention: . The patient has the following take home antiemetics prescribed: Prochlorperazine . The patient has the following take home medications prescribed: B12 for pemetrexed and folic acid for pemetrexed . Medication allergies and previous infusion related reactions, if applicable, have been reviewed and addressed. Yes . The patient's current medication list has been assessed for  drug-drug interactions with their chemotherapy regimen. no significant drug-drug interactions were identified on review.  Order Review: . Are the treatment plan orders signed? Yes . Is the patient scheduled to see a provider prior to their treatment? Yes  I verify that I have reviewed each item in the above checklist and answered each question accordingly.  Quantrell,Lataya Varnell D 08/27/2019 9:01 AM

## 2019-08-27 NOTE — Telephone Encounter (Signed)
Patients wife called to question if any of the appointments could be consolidated.  Explained why the timing of these appointments are set up the best way.  She stated understanding and no further questions.

## 2019-08-30 ENCOUNTER — Encounter: Payer: Self-pay | Admitting: Radiology

## 2019-08-30 ENCOUNTER — Inpatient Hospital Stay: Payer: Medicare Other

## 2019-08-30 ENCOUNTER — Other Ambulatory Visit: Payer: Self-pay | Admitting: Medical Oncology

## 2019-08-30 ENCOUNTER — Other Ambulatory Visit: Payer: Self-pay

## 2019-08-30 DIAGNOSIS — C3491 Malignant neoplasm of unspecified part of right bronchus or lung: Secondary | ICD-10-CM

## 2019-08-30 NOTE — Research (Signed)
ALCHEMIST [id number]: ADJUVANT LUNG CANCER ENRICHMENT MARKER IDENTIFICATION AND SEQUENCING TRIAL  **LATE ENTRY FROM 08/06/19**  REGISTRATION: After being registered into OPEN, patient was given ID# [id number].  Carol Ada, RT(R)(T) Clinical Research Coordinator

## 2019-08-30 NOTE — Progress Notes (Signed)
[facility] OFFICE PROGRESS NOTE  Luis Huddle, MD [place] [address] [place] [address]  DIAGNOSIS: Stage IIB (T1b, N1, M0) non-small cell lung cancer, invasive poorly differentiated carcinoma diagnosed in June 2021. Molecular studies are negative for EGFR mutation as well as ALK gene translocation.  PD-L1 expression was 50-100%.  This is based on the report from the [facility] trial  PRIOR THERAPY: Status post right upper lobectomy with lymph node dissection on June 17, 2019 under the care of Dr. Roxan Hockey.  CURRENT THERAPY: Adjuvant systemic chemotherapy with cisplatin 75 mg/M2 and Alimta 500 mg/M2 every 3 weeks.  First dose September 02, 2019.   INTERVAL HISTORY: Luis [name] 69 y.o. male returns to the clinic today for a follow-up visit. The patient is feeling well today without any concerning complaints.  The patient is scheduled to receive his first cycle of adjuvant systemic chemotherapy tomorrow.  He denies any recent fever or chills. He reports night sweats for the last several weeks. He did mention that his night sweats have significantly decreased in severity over the last month.  He denies any weight loss. He reports his usual baseline dyspnea on exertion and productive cough which produces clear sputum.  He has some lingering right sided chest pain from his surgery which is healing nicely. He denies any nausea, vomiting, or constipation.  He reports frequent diarrhea for the last 30 years intermittently. He denies any usual diarrhea at this time.  He denies any visual changes. He has a history of chronic headaches and denies any unusual headaches. His staging CT scan of the head was unremarkable.  The patient is here today for evaluation before starting his first cycle of adjuvant treatment.  MEDICAL HISTORY: Past Medical History:  Diagnosis Date  . Arthritis    through out body  . Cancer ([place])   . Chronic bronchitis ([place])   . Concussion   . COPD  (chronic obstructive pulmonary disease) ([place])   . Dyspnea   . Emphysema lung ([place])   . Frozen shoulder    LEFT  . GERD (gastroesophageal reflux disease)   . Hard of hearing   . Headache    alot of days, related to spine issues  . Hypertension   . Lumbar radiculopathy 2013  . Peyronie's disease   . Prostatitis 2011   Dr. Gaynelle Arabian  . RLS (restless legs syndrome)   . Shingles 06/13/2018   shingles on back, finished with prednisone, stills has scabs and pain  . Thigh pain    Bilateral Thigh  . Thigh pain 10/13   bilateral  . Tobacco abuse 2010   still uses electronic cig/ emphysema, SOB easily  . Wears partial dentures    upper    ALLERGIES:  is allergic to doxycycline and tramadol.  MEDICATIONS:  Current Outpatient Medications  Medication Sig Dispense Refill  . acetaminophen (TYLENOL) 650 MG CR tablet Take 650 mg by mouth 2 (two) times daily as needed for pain.    . Cholecalciferol (VITAMIN D) 125 MCG (5000 UT) CAPS Take 5,000 Units by mouth daily.    . citalopram (CELEXA) 20 MG tablet Take 20 mg by mouth daily.    [name] dexamethasone (DECADRON) 4 MG tablet 4 mg p.o. twice daily the day before, day of and day after chemotherapy every 3 weeks. 40 tablet 0  . docusate sodium (COLACE) 100 MG capsule Take 100-200 mg by mouth 2 (two) times daily as needed for mild constipation.    [name]  folic acid (FOLVITE) 1 MG tablet Take 1 tablet (1 mg total) by mouth daily. 30 tablet 4  . guaiFENesin (MUCINEX) 600 MG 12 hr tablet Take 1 tablet (600 mg total) by mouth 2 (two) times daily as needed for cough or to loosen phlegm.    . hydrocortisone cream 1 % Apply 1 application topically daily. Skin dermatitis    . pantoprazole (PROTONIX) 20 MG tablet Take 20 mg by mouth every evening.    . prochlorperazine (COMPAZINE) 10 MG tablet Take 1 tablet (10 mg total) by mouth every 6 (six) hours as needed for nausea or vomiting. 30 tablet 0  . Tiotropium Bromide-Olodaterol (STIOLTO RESPIMAT) 2.5-2.5 MCG/ACT  AERS Inhale 2 puffs into the lungs daily. 4 g 11  . valsartan-hydrochlorothiazide (DIOVAN-HCT) 80-12.5 MG tablet Take 1 tablet by mouth daily.    . vitamin B-12 (CYANOCOBALAMIN) 1000 MCG tablet Take 2,000 mcg by mouth daily.     No current facility-administered medications for this visit.    SURGICAL HISTORY:  Past Surgical History:  Procedure Laterality Date  . CATARACT EXTRACTION W/PHACO Right 07/08/2018   Procedure: CATARACT EXTRACTION PHACO AND INTRAOCULAR LENS PLACEMENT ([place]) RIGHT;  Surgeon: Leandrew Koyanagi, MD;  Location: [facility];  Service: Ophthalmology;  Laterality: Right;  . CATARACT EXTRACTION W/PHACO Left 07/29/2018   Procedure: CATARACT EXTRACTION PHACO AND INTRAOCULAR LENS PLACEMENT ([place]) LEFT TORIC LENS;  Surgeon: Leandrew Koyanagi, MD;  Location: [facility];  Service: Ophthalmology;  Laterality: Left;  . CHEST TUBE INSERTION Right 07/01/2019   Procedure: CHEST TUBE INSERTION;  Surgeon: [name], MD;  Location: [facility];  Service: Thoracic;  Laterality: Right;  . COLONOSCOPY    . EYE SURGERY    . INTERCOSTAL NERVE BLOCK Right 06/17/2019   Procedure: INTERCOSTAL NERVE BLOCK;  Surgeon: [name], MD;  Location: [facility];  Service: Thoracic;  Laterality: Right;  . INTERCOSTAL NERVE BLOCK Right 07/01/2019   Procedure: INTERCOSTAL NERVE BLOCK;  Surgeon: [name], MD;  Location: [facility];  Service: Thoracic;  Laterality: Right;  . TONSILLECTOMY    . VIDEO ASSISTED THORACOSCOPY Right 07/01/2019   Procedure: VIDEO ASSISTED THORACOSCOPY - REMOVAL OF RIGHT MIDDLE LOBE BLEBS;  Surgeon: [name], MD;  Location: [facility];  Service: Thoracic;  Laterality: Right;  [name] VIDEO BRONCHOSCOPY WITH INSERTION OF INTERBRONCHIAL VALVE (IBV) Right 06/25/2019   Procedure: VIDEO BRONCHOSCOPY WITH INSERTION OF INTERBRONCHIAL VALVE (IBV); Size 7 IBV into Right Loer Lobe (RLL) Superior Segment, Size 9 IBV into RLL Basilar Segment;  Surgeon:  [name], MD;  Location: [facility];  Service: Thoracic;  Laterality: Right;  [name] VIDEO BRONCHOSCOPY WITH INSERTION OF INTERBRONCHIAL VALVE (IBV) N/A 08/06/2019   Procedure: VIDEO BRONCHOSCOPY WITH REMOVAL OF INTERBRONCHIAL VALVE (IBV);  Surgeon: [name], MD;  Location[facility];  Service: Thoracic;  Laterality: N/A;    REVIEW OF SYSTEMS:   Review of Systems  Constitutional: Positive for night sweats. Negative for appetite change, chills, fatigue, fever and unexpected weight change.  HENT: Negative for mouth sores, nosebleeds, sore throat and trouble swallowing.   Eyes: Negative for eye problems and icterus.  Respiratory: Positive for baseline dry cough and shortness of breath with exertion.  Negative for hemoptysis and wheezing.   Cardiovascular: Positive for right sided chest discomfort from recent surgery. Negative for leg swelling.  Gastrointestinal: Negative for abdominal pain, constipation, diarrhea, nausea and vomiting.  Genitourinary: Negative for bladder incontinence, difficulty urinating, dysuria, frequency and hematuria.   Musculoskeletal: Negative for  back pain, gait problem, neck pain and neck stiffness.  Skin: Negative for itching and rash.  Neurological: Positive for chronic headaches. Negative for dizziness, extremity weakness, gait problem, light-headedness and seizures.  Hematological: Negative for adenopathy. Does not bruise/bleed easily.  Psychiatric/Behavioral: Negative for confusion, depression and sleep disturbance. The patient is not nervous/anxious.      PHYSICAL EXAMINATION:  Blood pressure 129/89, pulse 92, temperature 97.6 F (36.4 C), temperature source Tympanic, resp. rate 18, height _0  (1.676 m), weight 132 lb 6.4 oz (60.1 kg), SpO2 98 %.  ECOG PERFORMANCE STATUS: 1 - Symptomatic but completely ambulatory  Physical Exam  Constitutional: Oriented to person, place, and time and well-developed, well-nourished, and in no distress.  HENT:   Head: Normocephalic and atraumatic.  Mouth/Throat: Oropharynx is clear and moist. No oropharyngeal exudate.  Eyes: Conjunctivae are normal. Right eye exhibits no discharge. Left eye exhibits no discharge. No scleral icterus.  Neck: Normal range of motion. Neck supple.  Cardiovascular: Normal rate, regular rhythm, normal heart sounds and intact distal pulses.   Pulmonary/Chest: Effort normal. Decreased breath sounds in right upper lobe of lung. No respiratory distress. No wheezes. No rales.  Abdominal: Soft. Bowel sounds are normal. Exhibits no distension and no mass. There is no tenderness.  Musculoskeletal: Normal range of motion. Exhibits no edema.  Lymphadenopathy:    No cervical adenopathy.  Neurological: Alert and oriented to person, place, and time. Exhibits normal muscle tone. Gait normal. Coordination normal.  Skin: Skin is warm and dry. No rash noted. Not diaphoretic. No erythema. No pallor.  Psychiatric: Mood, memory and judgment normal.  Vitals reviewed.  LABORATORY DATA: Lab Results  Component Value Date   WBC 8.4 09/01/2019   HGB 15.3 09/01/2019   HCT 45.3 09/01/2019   MCV 86.8 09/01/2019   PLT 247 09/01/2019      Chemistry      Component Value Date/Time   NA 136 09/01/2019 0919   K 4.5 09/01/2019 0919   CL 101 09/01/2019 0919   CO2 27 09/01/2019 0919   BUN 15 09/01/2019 0919   CREATININE 1.04 09/01/2019 0919      Component Value Date/Time   CALCIUM 9.7 09/01/2019 0919   ALKPHOS 81 09/01/2019 0919   AST 9 (L) 09/01/2019 0919   ALT 6 09/01/2019 0919   BILITOT 0.6 09/01/2019 0919       RADIOGRAPHIC STUDIES:  DG Chest 2 View  Result Date: 08/05/2019 CLINICAL DATA:  Dyspnea EXAM: CHEST - 2 VIEW COMPARISON:  07/27/2019 FINDINGS: Right-sided volume loss with moderate sub pulmonic right pleural effusion is again seen and is unchanged. Endobronchial plugs are noted involving the residual right upper and probable lower lobar bronchi. Left lung is clear. No  pneumothorax or pleural effusion on the left. Cardiac size is within normal limits. No acute bone abnormality. IMPRESSION: Stable examination with right-sided volume loss and moderate sub pulmonic right pleural effusion. Endobronchial plugs are unchanged. No recurrence pneumothorax. Electronically Signed   By: Fidela Salisbury MD   On: 08/05/2019 22:26     ASSESSMENT/PLAN:  This is a very pleasant 69 year old Caucasian male recently diagnosed with stage IIb (T1b, N1, M0) non-small cell lung cancer, adenocarcinoma.  He is status post a right upper lobe lobectomy with lymph node dissection under the care of Dr. Roxan Hockey.  This was performed on June 17, 2019.  He has no actionable mutations.  His PD-L1 expression was estimated to be in the range of 50 to 100% by the molecular studies performed  by the [facility] clinical trial.   The patient is currently undergoing systemic adjuvant chemotherapy with cisplatin 75 mg per metered square and Alimta 500 mg/m.  He is scheduled to receive his first treatment tomorrow.    Labs were reviewed.  Recommend he proceed with cycle #1 tomorrow as scheduled.  We will see him back for follow-up visit in 1 week for evaluation and to manage any adverse side effects of treatment.  He will take his pre-medication with decadron upon returning home today.   The patient was advised to call immediately if he has any concerning symptoms in the interval. The patient voices understanding of current disease status and treatment options and is in agreement with the current care plan. All questions were answered. The patient knows to call the clinic with any problems, questions or concerns. We can certainly see the patient much sooner if necessary       No orders of the defined types were placed in this encounter.    Chrissi Crow L Zykeem Bauserman, PA-C 09/01/19

## 2019-08-31 ENCOUNTER — Encounter: Payer: Self-pay | Admitting: Internal Medicine

## 2019-08-31 NOTE — Progress Notes (Signed)
Received referral message from RN that patient has questions regarding insurance.  Called patient to introduce myself as Arboriculturist and to address his concerns.  Advised patient per referral notes from [place] representative, authorization was not required for either insurance and that the treatment meets medical necessity. Advised him that with this, payment is not guaranteed but he is more than welcome to reach out to his plans to confirm.  I will meet with him at registration 8/18 to provide my card for additional questions or concerns and to discuss the [employer].

## 2019-09-01 ENCOUNTER — Other Ambulatory Visit: Payer: Self-pay

## 2019-09-01 ENCOUNTER — Inpatient Hospital Stay: Payer: Medicare Other

## 2019-09-01 ENCOUNTER — Inpatient Hospital Stay (HOSPITAL_BASED_OUTPATIENT_CLINIC_OR_DEPARTMENT_OTHER): Payer: Medicare Other | Admitting: Physician Assistant

## 2019-09-01 ENCOUNTER — Encounter: Payer: Self-pay | Admitting: Radiology

## 2019-09-01 ENCOUNTER — Encounter: Payer: Self-pay | Admitting: Internal Medicine

## 2019-09-01 VITALS — BP 129/89 | HR 92 | Temp 97.6°F | Resp 18 | Ht 66.0 in | Wt 132.4 lb

## 2019-09-01 DIAGNOSIS — C3411 Malignant neoplasm of upper lobe, right bronchus or lung: Secondary | ICD-10-CM | POA: Diagnosis not present

## 2019-09-01 DIAGNOSIS — Z5111 Encounter for antineoplastic chemotherapy: Secondary | ICD-10-CM

## 2019-09-01 DIAGNOSIS — Z006 Encounter for examination for normal comparison and control in clinical research program: Secondary | ICD-10-CM

## 2019-09-01 DIAGNOSIS — J449 Chronic obstructive pulmonary disease, unspecified: Secondary | ICD-10-CM | POA: Diagnosis not present

## 2019-09-01 DIAGNOSIS — C3491 Malignant neoplasm of unspecified part of right bronchus or lung: Secondary | ICD-10-CM

## 2019-09-01 DIAGNOSIS — M1990 Unspecified osteoarthritis, unspecified site: Secondary | ICD-10-CM | POA: Diagnosis not present

## 2019-09-01 DIAGNOSIS — R109 Unspecified abdominal pain: Secondary | ICD-10-CM | POA: Diagnosis not present

## 2019-09-01 DIAGNOSIS — K219 Gastro-esophageal reflux disease without esophagitis: Secondary | ICD-10-CM | POA: Diagnosis not present

## 2019-09-01 LAB — CBC WITH DIFFERENTIAL (CANCER CENTER ONLY)
Abs Immature Granulocytes: 0.04 10*3/uL (ref 0.00–0.07)
Basophils Absolute: 0.1 10*3/uL (ref 0.0–0.1)
Basophils Relative: 1 %
Eosinophils Absolute: 0.2 10*3/uL (ref 0.0–0.5)
Eosinophils Relative: 3 %
HCT: 45.3 % (ref 39.0–52.0)
Hemoglobin: 15.3 g/dL (ref 13.0–17.0)
Immature Granulocytes: 1 %
Lymphocytes Relative: 20 %
Lymphs Abs: 1.7 10*3/uL (ref 0.7–4.0)
MCH: 29.3 pg (ref 26.0–34.0)
MCHC: 33.8 g/dL (ref 30.0–36.0)
MCV: 86.8 fL (ref 80.0–100.0)
Monocytes Absolute: 0.9 10*3/uL (ref 0.1–1.0)
Monocytes Relative: 11 %
Neutro Abs: 5.5 10*3/uL (ref 1.7–7.7)
Neutrophils Relative %: 64 %
Platelet Count: 247 10*3/uL (ref 150–400)
RBC: 5.22 MIL/uL (ref 4.22–5.81)
RDW: 12.7 % (ref 11.5–15.5)
WBC Count: 8.4 10*3/uL (ref 4.0–10.5)
nRBC: 0 % (ref 0.0–0.2)

## 2019-09-01 LAB — CMP (CANCER CENTER ONLY)
ALT: 6 U/L (ref 0–44)
AST: 9 U/L — ABNORMAL LOW (ref 15–41)
Albumin: 3.7 g/dL (ref 3.5–5.0)
Alkaline Phosphatase: 81 U/L (ref 38–126)
Anion gap: 8 (ref 5–15)
BUN: 15 mg/dL (ref 8–23)
CO2: 27 mmol/L (ref 22–32)
Calcium: 9.7 mg/dL (ref 8.9–10.3)
Chloride: 101 mmol/L (ref 98–111)
Creatinine: 1.04 mg/dL (ref 0.61–1.24)
GFR, Est AFR Am: >60 mL/min (ref >60)
GFR, Estimated: >60 mL/min (ref >60)
Glucose, Bld: 100 mg/dL — ABNORMAL HIGH (ref 70–99)
Potassium: 4.5 mmol/L (ref 3.5–5.1)
Sodium: 136 mmol/L (ref 135–145)
Total Bilirubin: 0.6 mg/dL (ref 0.3–1.2)
Total Protein: 6.7 g/dL (ref 6.5–8.1)

## 2019-09-01 LAB — MAGNESIUM: Magnesium: 2 mg/dL (ref 1.7–2.4)

## 2019-09-01 LAB — RESEARCH LABS

## 2019-09-01 NOTE — Research (Signed)
DCP-001 Use of a Clinical Trial Screening Tool to Address Cancer Health Disparities in the [facility] Program  09/01/19    10:15AM  CONSENT: Informed patient of the DCP-001 study and offered him the opportunity to participate. Informed patient that participation is voluntary and involves a one time consent and collection of demographic variables, with the majority of data collected from his medical record. Noted that no patient identifiers are being reported to the study.  Spent 15 minutes reviewing the consent and authorization forms with the patient in their entirety. Explained the potential benefits and risks of participation in this study. All of his questions were answered and he agreed to participate. The consent form for [facility] 07/07/18, [place] Active 01/19/19 and authorization form dated 03/07/14 were signed and dated by the patient. Copies of the signed documents will be given to patient tomorrow, at his next visit.  Patient was then interviewed by this research nurse and answered the study questions, which took about 5 minutes.  Thanked patient for his time and participation on this study.  Patient meets eligibility and will be enrolled in the DCP-001 study.  Worksheet was completed by patient and patient was enrolled into OPEN, patient ID# [id number].  Carol Ada, RT(R)(T) Clinical Research Coordinator 09/01/19   11:17AM

## 2019-09-01 NOTE — Progress Notes (Signed)
Met with patient at registration to introduce myself as Arboriculturist in person and to give my card.  Discussed one-time $1000 Radio broadcast assistant to assist with personal expenses while going through treatment.  Asked patient if I answered his questions thoroughly regarding insurance on yesterday. He stated I did and was very appreciative.  He has my card for any additional financial questions or concerns.

## 2019-09-02 ENCOUNTER — Other Ambulatory Visit: Payer: Self-pay

## 2019-09-02 ENCOUNTER — Ambulatory Visit: Payer: Medicare Other | Admitting: Internal Medicine

## 2019-09-02 ENCOUNTER — Other Ambulatory Visit: Payer: Medicare Other

## 2019-09-02 ENCOUNTER — Inpatient Hospital Stay: Payer: Medicare Other

## 2019-09-02 VITALS — BP 139/84 | HR 85 | Temp 98.3°F | Resp 16

## 2019-09-02 DIAGNOSIS — Z5111 Encounter for antineoplastic chemotherapy: Secondary | ICD-10-CM | POA: Diagnosis not present

## 2019-09-02 DIAGNOSIS — R109 Unspecified abdominal pain: Secondary | ICD-10-CM | POA: Diagnosis not present

## 2019-09-02 DIAGNOSIS — C3411 Malignant neoplasm of upper lobe, right bronchus or lung: Secondary | ICD-10-CM | POA: Diagnosis not present

## 2019-09-02 DIAGNOSIS — K219 Gastro-esophageal reflux disease without esophagitis: Secondary | ICD-10-CM | POA: Diagnosis not present

## 2019-09-02 DIAGNOSIS — M1990 Unspecified osteoarthritis, unspecified site: Secondary | ICD-10-CM | POA: Diagnosis not present

## 2019-09-02 DIAGNOSIS — J449 Chronic obstructive pulmonary disease, unspecified: Secondary | ICD-10-CM | POA: Diagnosis not present

## 2019-09-02 DIAGNOSIS — C3491 Malignant neoplasm of unspecified part of right bronchus or lung: Secondary | ICD-10-CM

## 2019-09-02 MED ORDER — SODIUM CHLORIDE 0.9 % IV SOLN
75.0000 mg/m2 | Freq: Once | INTRAVENOUS | Status: AC
  Administered 2019-09-02: 125 mg via INTRAVENOUS
  Filled 2019-09-02: qty 125

## 2019-09-02 MED ORDER — SODIUM CHLORIDE 0.9 % IV SOLN
10.0000 mg | Freq: Once | INTRAVENOUS | Status: AC
  Administered 2019-09-02: 10 mg via INTRAVENOUS
  Filled 2019-09-02: qty 10

## 2019-09-02 MED ORDER — PALONOSETRON HCL INJECTION 0.25 MG/5ML
INTRAVENOUS | Status: AC
  Filled 2019-09-02: qty 5

## 2019-09-02 MED ORDER — PALONOSETRON HCL INJECTION 0.25 MG/5ML
0.2500 mg | Freq: Once | INTRAVENOUS | Status: AC
  Administered 2019-09-02: 0.25 mg via INTRAVENOUS

## 2019-09-02 MED ORDER — SODIUM CHLORIDE 0.9 % IV SOLN
Freq: Once | INTRAVENOUS | Status: AC
  Filled 2019-09-02: qty 250

## 2019-09-02 MED ORDER — SODIUM CHLORIDE 0.9 % IV SOLN
500.0000 mg/m2 | Freq: Once | INTRAVENOUS | Status: AC
  Administered 2019-09-02: 800 mg via INTRAVENOUS
  Filled 2019-09-02: qty 12

## 2019-09-02 MED ORDER — SODIUM CHLORIDE 0.9 % IV SOLN
150.0000 mg | Freq: Once | INTRAVENOUS | Status: AC
  Administered 2019-09-02: 150 mg via INTRAVENOUS
  Filled 2019-09-02: qty 150

## 2019-09-02 MED ORDER — SODIUM CHLORIDE 0.9 % IV SOLN
Freq: Once | INTRAVENOUS | Status: AC
  Filled 2019-09-02: qty 10

## 2019-09-02 NOTE — Patient Instructions (Addendum)
[facility] Discharge Instructions for Patients Receiving Chemotherapy  Today you received the following chemotherapy agents: Pemetrexed (ALIMTA) & Cisplatin (PLATINOL).  To help prevent nausea and vomiting after your treatment, we encourage you to take your nausea medication as directed.    If you develop nausea and vomiting that is not controlled by your nausea medication, call the clinic.   BELOW ARE SYMPTOMS THAT SHOULD BE REPORTED IMMEDIATELY:  *FEVER GREATER THAN 100.5 F  *CHILLS WITH OR WITHOUT FEVER  NAUSEA AND VOMITING THAT IS NOT CONTROLLED WITH YOUR NAUSEA MEDICATION  *UNUSUAL SHORTNESS OF BREATH  *UNUSUAL BRUISING OR BLEEDING  TENDERNESS IN MOUTH AND THROAT WITH OR WITHOUT PRESENCE OF ULCERS  *URINARY PROBLEMS  *BOWEL PROBLEMS  UNUSUAL RASH Items with * indicate a potential emergency and should be followed up as soon as possible.  Feel free to call the clinic you have any questions or concerns. The clinic phone number is [phone].  Pemetrexed injection What is this medicine? PEMETREXED (PEM e TREX ed) is a chemotherapy drug used to treat lung cancers like non-small cell lung cancer and mesothelioma. It may also be used to treat other cancers. This medicine may be used for other purposes; ask your health care provider or pharmacist if you have questions. COMMON BRAND NAME(S): Alimta What should I tell my health care provider before I take this medicine? They need to know if you have any of these conditions:  infection (especially a virus infection such as chickenpox, cold sores, or herpes)  kidney disease  low blood counts, like low white cell, platelet, or red cell counts  lung or breathing disease, like asthma  radiation therapy  an unusual or allergic reaction to pemetrexed, other medicines, foods, dyes, or preservative  pregnant or trying to get pregnant  breast-feeding How should I use this medicine? This drug is given as an  infusion into a vein. It is administered in a hospital or clinic by a specially trained health care professional. Talk to your pediatrician regarding the use of this medicine in children. Special care may be needed. Overdosage: If you think you have taken too much of this medicine contact a poison control center or emergency room at once. NOTE: This medicine is only for you. Do not share this medicine with others. What if I miss a dose? It is important not to miss your dose. Call your doctor or health care professional if you are unable to keep an appointment. What may interact with this medicine? This medicine may interact with the following medications:  Ibuprofen This list may not describe all possible interactions. Give your health care provider a list of all the medicines, herbs, non-prescription drugs, or dietary supplements you use. Also tell them if you smoke, drink alcohol, or use illegal drugs. Some items may interact with your medicine. What should I watch for while using this medicine? Visit your doctor for checks on your progress. This drug may make you feel generally unwell. This is not uncommon, as chemotherapy can affect healthy cells as well as cancer cells. Report any side effects. Continue your course of treatment even though you feel ill unless your doctor tells you to stop. In some cases, you may be given additional medicines to help with side effects. Follow all directions for their use. Call your doctor or health care professional for advice if you get a fever, chills or sore throat, or other symptoms of a cold or flu. Do not treat yourself. This drug decreases  your body's ability to fight infections. Try to avoid being around people who are sick. This medicine may increase your risk to bruise or bleed. Call your doctor or health care professional if you notice any unusual bleeding. Be careful brushing and flossing your teeth or using a toothpick because you may get an  infection or bleed more easily. If you have any dental work done, tell your dentist you are receiving this medicine. Avoid taking products that contain aspirin, acetaminophen, ibuprofen, naproxen, or ketoprofen unless instructed by your doctor. These medicines may hide a fever. Call your doctor or health care professional if you get diarrhea or mouth sores. Do not treat yourself. To protect your kidneys, drink water or other fluids as directed while you are taking this medicine. Do not become pregnant while taking this medicine or for 6 months after stopping it. Women should inform their doctor if they wish to become pregnant or think they might be pregnant. Men should not father a child while taking this medicine and for 3 months after stopping it. This may interfere with the ability to father a child. You should talk to your doctor or health care professional if you are concerned about your fertility. There is a potential for serious side effects to an unborn child. Talk to your health care professional or pharmacist for more information. Do not breast-feed an infant while taking this medicine or for 1 week after stopping it. What side effects may I notice from receiving this medicine? Side effects that you should report to your doctor or health care professional as soon as possible:  allergic reactions like skin rash, itching or hives, swelling of the face, lips, or tongue  breathing problems  redness, blistering, peeling or loosening of the skin, including inside the mouth  signs and symptoms of bleeding such as bloody or black, tarry stools; red or dark-brown urine; spitting up blood or brown material that looks like coffee grounds; red spots on the skin; unusual bruising or bleeding from the eye, gums, or nose  signs and symptoms of infection like fever or chills; cough; sore throat; pain or trouble passing urine  signs and symptoms of kidney injury like trouble passing urine or change in the  amount of urine  signs and symptoms of liver injury like dark yellow or brown urine; general ill feeling or flu-like symptoms; light-colored stools; loss of appetite; nausea; right upper belly pain; unusually weak or tired; yellowing of the eyes or skin Side effects that usually do not require medical attention (report to your doctor or health care professional if they continue or are bothersome):  constipation  mouth sores  nausea, vomiting  unusually weak or tired This list may not describe all possible side effects. Call your doctor for medical advice about side effects. You may report side effects to FDA at 1-800-FDA-1088. Where should I keep my medicine? This drug is given in a hospital or clinic and will not be stored at home. NOTE: This sheet is a summary. It may not cover all possible information. If you have questions about this medicine, talk to your doctor, pharmacist, or health care provider.  2020 Elsevier/Gold Standard (2017-02-19 16:11:33)  Cisplatin injection What is this medicine? CISPLATIN (SIS pla tin) is a chemotherapy drug. It targets fast dividing cells, like cancer cells, and causes these cells to die. This medicine is used to treat many types of cancer like bladder, ovarian, and testicular cancers. This medicine may be used for other purposes; ask your  health care provider or pharmacist if you have questions. COMMON BRAND NAME(S): Platinol, Platinol -AQ What should I tell my health care provider before I take this medicine? They need to know if you have any of these conditions:  eye disease, vision problems  hearing problems  kidney disease  low blood counts, like white cells, platelets, or red blood cells  tingling of the fingers or toes, or other nerve disorder  an unusual or allergic reaction to cisplatin, carboplatin, oxaliplatin, other medicines, foods, dyes, or preservatives  pregnant or trying to get pregnant  breast-feeding How should I use  this medicine? This drug is given as an infusion into a vein. It is administered in a hospital or clinic by a specially trained health care professional. Talk to your pediatrician regarding the use of this medicine in children. Special care may be needed. Overdosage: If you think you have taken too much of this medicine contact a poison control center or emergency room at once. NOTE: This medicine is only for you. Do not share this medicine with others. What if I miss a dose? It is important not to miss a dose. Call your doctor or health care professional if you are unable to keep an appointment. What may interact with this medicine? This medicine may interact with the following medications:  foscarnet  certain antibiotics like amikacin, gentamicin, neomycin, polymyxin B, streptomycin, tobramycin, vancomycin This list may not describe all possible interactions. Give your health care provider a list of all the medicines, herbs, non-prescription drugs, or dietary supplements you use. Also tell them if you smoke, drink alcohol, or use illegal drugs. Some items may interact with your medicine. What should I watch for while using this medicine? Your condition will be monitored carefully while you are receiving this medicine. You will need important blood work done while you are taking this medicine. This drug may make you feel generally unwell. This is not uncommon, as chemotherapy can affect healthy cells as well as cancer cells. Report any side effects. Continue your course of treatment even though you feel ill unless your doctor tells you to stop. This medicine may increase your risk of getting an infection. Call your healthcare professional for advice if you get a fever, chills, or sore throat, or other symptoms of a cold or flu. Do not treat yourself. Try to avoid being around people who are sick. Avoid taking medicines that contain aspirin, acetaminophen, ibuprofen, naproxen, or ketoprofen unless  instructed by your healthcare professional. These medicines may hide a fever. This medicine may increase your risk to bruise or bleed. Call your doctor or health care professional if you notice any unusual bleeding. Be careful brushing and flossing your teeth or using a toothpick because you may get an infection or bleed more easily. If you have any dental work done, tell your dentist you are receiving this medicine. Do not become pregnant while taking this medicine or for 14 months after stopping it. Women should inform their healthcare professional if they wish to become pregnant or think they might be pregnant. Men should not father a child while taking this medicine and for 11 months after stopping it. There is potential for serious side effects to an unborn child. Talk to your healthcare professional for more information. Do not breast-feed an infant while taking this medicine. This medicine has caused ovarian failure in some women. This medicine may make it more difficult to get pregnant. Talk to your healthcare professional if you are concerned about  your fertility. This medicine has caused decreased sperm counts in some men. This may make it more difficult to father a child. Talk to your healthcare professional if you are concerned about your fertility. Drink fluids as directed while you are taking this medicine. This will help protect your kidneys. Call your doctor or health care professional if you get diarrhea. Do not treat yourself. What side effects may I notice from receiving this medicine? Side effects that you should report to your doctor or health care professional as soon as possible:  allergic reactions like skin rash, itching or hives, swelling of the face, lips, or tongue  blurred vision  changes in vision  decreased hearing or ringing of the ears  nausea, vomiting  pain, redness, or irritation at site where injected  pain, tingling, numbness in the hands or feet  signs  and symptoms of bleeding such as bloody or black, tarry stools; red or dark brown urine; spitting up blood or brown material that looks like coffee grounds; red spots on the skin; unusual bruising or bleeding from the eyes, gums, or nose  signs and symptoms of infection like fever; chills; cough; sore throat; pain or trouble passing urine  signs and symptoms of kidney injury like trouble passing urine or change in the amount of urine  signs and symptoms of low red blood cells or anemia such as unusually weak or tired; feeling faint or lightheaded; falls; breathing problems Side effects that usually do not require medical attention (report to your doctor or health care professional if they continue or are bothersome):  loss of appetite  mouth sores  muscle cramps This list may not describe all possible side effects. Call your doctor for medical advice about side effects. You may report side effects to FDA at 1-800-FDA-1088. Where should I keep my medicine? This drug is given in a hospital or clinic and will not be stored at home. NOTE: This sheet is a summary. It may not cover all possible information. If you have questions about this medicine, talk to your doctor, pharmacist, or health care provider.  2020 Elsevier/Gold Standard (2017-12-26 15:59:17)

## 2019-09-03 ENCOUNTER — Encounter: Payer: Self-pay | Admitting: Internal Medicine

## 2019-09-03 ENCOUNTER — Telehealth: Payer: Self-pay | Admitting: *Deleted

## 2019-09-03 ENCOUNTER — Encounter: Payer: Self-pay | Admitting: Radiology

## 2019-09-03 DIAGNOSIS — C3491 Malignant neoplasm of unspecified part of right bronchus or lung: Secondary | ICD-10-CM

## 2019-09-03 NOTE — Research (Signed)
ALCHEMIST [id number]: ADJUVANT LUNG CANCER ENRICHMENT MARKER IDENTIFICATION AND SEQUENCING TRIAL  *LATE ENTRY*  BLOOD COLLECTION AND SHIPMENT: Blood was collected via venipuncture on 09/01/19 at 9:19AM by Leslye Peer, phlebotomist. The blood was then shipped on same day with [facility] Specimen IDs: [id number], [id number], [id number]. It was delivered on Thursday 09/02/19 at [place] RT(R)(T) Clinical Research Coordinator

## 2019-09-03 NOTE — Progress Notes (Signed)
Patient left voicemail regarding income guidelines for grant.  Returned call to patient to provide the income guidelines. Patient states his household income exceeds the guidelines. He was very appreciative for me sharing the information and returning his call.  He has my card for any additional financial questions or concerns.

## 2019-09-06 ENCOUNTER — Telehealth: Payer: Self-pay | Admitting: Medical Oncology

## 2019-09-06 DIAGNOSIS — R066 Hiccough: Secondary | ICD-10-CM

## 2019-09-06 DIAGNOSIS — R112 Nausea with vomiting, unspecified: Secondary | ICD-10-CM

## 2019-09-06 MED ORDER — ONDANSETRON HCL 8 MG PO TABS: 8.0000 mg | ORAL_TABLET | Freq: Three times a day (TID) | ORAL | 0 refills | Status: DC | PRN

## 2019-09-06 MED ORDER — CHLORPROMAZINE HCL 25 MG PO TABS: 25.0000 mg | ORAL_TABLET | Freq: Three times a day (TID) | ORAL | 0 refills | Status: DC | PRN

## 2019-09-06 NOTE — Telephone Encounter (Signed)
Nausea and vomiting /hiccoughs. Vomited 3 times yesterday and  3 times today .  Compazine not working. He has not slept since Thursday.Wife stated his Gatorade and soup is staying down.   Per Dr. Julien Nordmann zofran and thorazine escribed . Compazine discontinued.

## 2019-09-08 ENCOUNTER — Encounter: Payer: Self-pay | Admitting: Medical Oncology

## 2019-09-08 ENCOUNTER — Encounter: Payer: Self-pay | Admitting: Internal Medicine

## 2019-09-09 ENCOUNTER — Inpatient Hospital Stay: Payer: Medicare Other

## 2019-09-09 ENCOUNTER — Encounter: Payer: Self-pay | Admitting: Internal Medicine

## 2019-09-09 ENCOUNTER — Other Ambulatory Visit: Payer: Self-pay

## 2019-09-09 ENCOUNTER — Inpatient Hospital Stay (HOSPITAL_BASED_OUTPATIENT_CLINIC_OR_DEPARTMENT_OTHER): Payer: Medicare Other | Admitting: Internal Medicine

## 2019-09-09 VITALS — BP 108/78 | HR 85 | Temp 97.3°F | Resp 17 | Ht 66.0 in | Wt 131.2 lb

## 2019-09-09 DIAGNOSIS — Z5111 Encounter for antineoplastic chemotherapy: Secondary | ICD-10-CM

## 2019-09-09 DIAGNOSIS — C3491 Malignant neoplasm of unspecified part of right bronchus or lung: Secondary | ICD-10-CM

## 2019-09-09 DIAGNOSIS — M1990 Unspecified osteoarthritis, unspecified site: Secondary | ICD-10-CM | POA: Diagnosis not present

## 2019-09-09 DIAGNOSIS — C3411 Malignant neoplasm of upper lobe, right bronchus or lung: Secondary | ICD-10-CM | POA: Diagnosis not present

## 2019-09-09 DIAGNOSIS — J449 Chronic obstructive pulmonary disease, unspecified: Secondary | ICD-10-CM | POA: Diagnosis not present

## 2019-09-09 DIAGNOSIS — R109 Unspecified abdominal pain: Secondary | ICD-10-CM | POA: Diagnosis not present

## 2019-09-09 DIAGNOSIS — K219 Gastro-esophageal reflux disease without esophagitis: Secondary | ICD-10-CM | POA: Diagnosis not present

## 2019-09-09 LAB — CMP (CANCER CENTER ONLY)
ALT: <6 U/L (ref 0–44)
AST: 7 U/L — ABNORMAL LOW (ref 15–41)
Albumin: 3.2 g/dL — ABNORMAL LOW (ref 3.5–5.0)
Alkaline Phosphatase: 71 U/L (ref 38–126)
Anion gap: 4 — ABNORMAL LOW (ref 5–15)
BUN: 15 mg/dL (ref 8–23)
CO2: 31 mmol/L (ref 22–32)
Calcium: 9.1 mg/dL (ref 8.9–10.3)
Chloride: 96 mmol/L — ABNORMAL LOW (ref 98–111)
Creatinine: 0.95 mg/dL (ref 0.61–1.24)
GFR, Est AFR Am: >60 mL/min (ref >60)
GFR, Estimated: >60 mL/min (ref >60)
Glucose, Bld: 132 mg/dL — ABNORMAL HIGH (ref 70–99)
Potassium: 3.7 mmol/L (ref 3.5–5.1)
Sodium: 131 mmol/L — ABNORMAL LOW (ref 135–145)
Total Bilirubin: 0.5 mg/dL (ref 0.3–1.2)
Total Protein: 6.2 g/dL — ABNORMAL LOW (ref 6.5–8.1)

## 2019-09-09 LAB — CBC WITH DIFFERENTIAL (CANCER CENTER ONLY)
Abs Immature Granulocytes: 0.04 10*3/uL (ref 0.00–0.07)
Basophils Absolute: 0 10*3/uL (ref 0.0–0.1)
Basophils Relative: 0 %
Eosinophils Absolute: 0.3 10*3/uL (ref 0.0–0.5)
Eosinophils Relative: 6 %
HCT: 41.4 % (ref 39.0–52.0)
Hemoglobin: 14.1 g/dL (ref 13.0–17.0)
Immature Granulocytes: 1 %
Lymphocytes Relative: 26 %
Lymphs Abs: 1.4 10*3/uL (ref 0.7–4.0)
MCH: 29.1 pg (ref 26.0–34.0)
MCHC: 34.1 g/dL (ref 30.0–36.0)
MCV: 85.5 fL (ref 80.0–100.0)
Monocytes Absolute: 0.1 10*3/uL (ref 0.1–1.0)
Monocytes Relative: 2 %
Neutro Abs: 3.5 10*3/uL (ref 1.7–7.7)
Neutrophils Relative %: 65 %
Platelet Count: 168 10*3/uL (ref 150–400)
RBC: 4.84 MIL/uL (ref 4.22–5.81)
RDW: 12.3 % (ref 11.5–15.5)
WBC Count: 5.4 10*3/uL (ref 4.0–10.5)
nRBC: 0 % (ref 0.0–0.2)

## 2019-09-09 LAB — MAGNESIUM: Magnesium: 1.9 mg/dL (ref 1.7–2.4)

## 2019-09-09 MED ORDER — FLUCONAZOLE 100 MG PO TABS: 100.0000 mg | ORAL_TABLET | Freq: Every day | ORAL | 0 refills | Status: DC

## 2019-09-09 NOTE — Progress Notes (Signed)
[place] Telephone:([phone]   Fax:([phone]) [phone]  OFFICE PROGRESS NOTE  [name], MD [address] [place] [address]  DIAGNOSIS: Stage IIB (T1b, N1, M0) non-small cell lung cancer, invasive poorly differentiated carcinoma diagnosed in June 2021. Molecular studies are negative for EGFR mutation as well as ALK gene translocation.  PD-L1 expression was 50-100%.  This is based on the report from the [facility] trial  PRIOR THERAPY: Status post right upper lobectomy with lymph node dissection on June 17, 2019 under the care of Dr. Roxan Hockey.  CURRENT THERAPY: Adjuvant systemic chemotherapy with cisplatin 75 mg/M2 and Alimta 500 mg/M2 every 3 weeks.  First dose September 02, 2019. Status post 1 cycle.  INTERVAL HISTORY: Luis [name] 69 y.o. male returns to the clinic today for follow-up visit accompanied by his wife.  The patient is feeling a little bit fatigued and tired after the first dose of his treatment last week.  He also has indigestion and currently using Protonix twice daily.  He has some oral thrush.  He was suffering from hiccups for several days but we started him on Thorazine and improved significantly after 2 doses.  He denied having any current chest pain, cough or hemoptysis.  He denied having any fever or chills.  He has no nausea, vomiting, diarrhea or constipation.  He denied having any headache or visual changes.  He has no significant weight loss.  He is here today for evaluation and repeat blood work.  MEDICAL HISTORY: Past Medical History:  Diagnosis Date  . Arthritis    through out body  . Cancer ([place])   . Chronic bronchitis ([place])   . Concussion   . COPD (chronic obstructive pulmonary disease) ([place])   . Dyspnea   . Emphysema lung ([place])   . Frozen shoulder    LEFT  . GERD (gastroesophageal reflux disease)   . Hard of hearing   . Headache    alot of days, related to spine issues  . Hypertension   . Lumbar  radiculopathy 2013  . Peyronie's disease   . Prostatitis 2011   Dr. Gaynelle Arabian  . RLS (restless legs syndrome)   . Shingles 06/13/2018   shingles on back, finished with prednisone, stills has scabs and pain  . Thigh pain    Bilateral Thigh  . Thigh pain 10/13   bilateral  . Tobacco abuse 2010   still uses electronic cig/ emphysema, SOB easily  . Wears partial dentures    upper    ALLERGIES:  is allergic to doxycycline and tramadol.  MEDICATIONS:  Current Outpatient Medications  Medication Sig Dispense Refill  . acetaminophen (TYLENOL) 650 MG CR tablet Take 650 mg by mouth 2 (two) times daily as needed for pain.    . chlorproMAZINE (THORAZINE) 25 MG tablet Take 1 tablet (25 mg total) by mouth 3 (three) times daily as needed for hiccoughs. 30 tablet 0  . Cholecalciferol (VITAMIN D) 125 MCG (5000 UT) CAPS Take 5,000 Units by mouth daily.    . citalopram (CELEXA) 20 MG tablet Take 20 mg by mouth daily.    [name] dexamethasone (DECADRON) 4 MG tablet 4 mg p.o. twice daily the day before, day of and day after chemotherapy every 3 weeks. 40 tablet 0  . docusate sodium (COLACE) 100 MG capsule Take 100-200 mg by mouth 2 (two) times daily as needed for mild constipation.    . folic acid (FOLVITE) 1 MG tablet Take 1  tablet (1 mg total) by mouth daily. 30 tablet 4  . guaiFENesin (MUCINEX) 600 MG 12 hr tablet Take 1 tablet (600 mg total) by mouth 2 (two) times daily as needed for cough or to loosen phlegm.    . hydrocortisone cream 1 % Apply 1 application topically daily. Skin dermatitis    . ondansetron (ZOFRAN) 8 MG tablet Take 1 tablet (8 mg total) by mouth every 8 (eight) hours as needed for nausea or vomiting. 20 tablet 0  . pantoprazole (PROTONIX) 20 MG tablet Take 20 mg by mouth every evening.    . Tiotropium Bromide-Olodaterol (STIOLTO RESPIMAT) 2.5-2.5 MCG/ACT AERS Inhale 2 puffs into the lungs daily. 4 g 11  . valsartan-hydrochlorothiazide (DIOVAN-HCT) 80-12.5 MG tablet Take 1 tablet by  mouth daily.    . vitamin B-12 (CYANOCOBALAMIN) 1000 MCG tablet Take 2,000 mcg by mouth daily.     No current facility-administered medications for this visit.    SURGICAL HISTORY:  Past Surgical History:  Procedure Laterality Date  . CATARACT EXTRACTION W/PHACO Right 07/08/2018   Procedure: CATARACT EXTRACTION PHACO AND INTRAOCULAR LENS PLACEMENT ([place]) RIGHT;  Surgeon: Leandrew Koyanagi, MD;  Location: [facility];  Service: Ophthalmology;  Laterality: Right;  . CATARACT EXTRACTION W/PHACO Left 07/29/2018   Procedure: CATARACT EXTRACTION PHACO AND INTRAOCULAR LENS PLACEMENT ([place]) LEFT TORIC LENS;  Surgeon: Leandrew Koyanagi, MD;  Location: [facility];  Service: Ophthalmology;  Laterality: Left;  . CHEST TUBE INSERTION Right 07/01/2019   Procedure: CHEST TUBE INSERTION;  Surgeon: [name], MD;  Location: [facility];  Service: Thoracic;  Laterality: Right;  . COLONOSCOPY    . EYE SURGERY    . INTERCOSTAL NERVE BLOCK Right 06/17/2019   Procedure: INTERCOSTAL NERVE BLOCK;  Surgeon: [name], MD;  Location: [facility];  Service: Thoracic;  Laterality: Right;  . INTERCOSTAL NERVE BLOCK Right 07/01/2019   Procedure: INTERCOSTAL NERVE BLOCK;  Surgeon: [name], MD;  Location: [facility];  Service: Thoracic;  Laterality: Right;  . TONSILLECTOMY    . VIDEO ASSISTED THORACOSCOPY Right 07/01/2019   Procedure: VIDEO ASSISTED THORACOSCOPY - REMOVAL OF RIGHT MIDDLE LOBE BLEBS;  Surgeon: [name], MD;  Location: [facility];  Service: Thoracic;  Laterality: Right;  [name] VIDEO BRONCHOSCOPY WITH INSERTION OF INTERBRONCHIAL VALVE (IBV) Right 06/25/2019   Procedure: VIDEO BRONCHOSCOPY WITH INSERTION OF INTERBRONCHIAL VALVE (IBV); Size 7 IBV into Right Loer Lobe (RLL) Superior Segment, Size 9 IBV into RLL Basilar Segment;  Surgeon: [name], MD;  Location: [facility] OR;  Service: Thoracic;  Laterality: Right;  [name] VIDEO BRONCHOSCOPY WITH INSERTION OF  INTERBRONCHIAL VALVE (IBV) N/A 08/06/2019   Procedure: VIDEO BRONCHOSCOPY WITH REMOVAL OF INTERBRONCHIAL VALVE (IBV);  Surgeon: [name], MD;  [facility];  Service: Thoracic;  Laterality: N/A;    REVIEW OF SYSTEMS:  A comprehensive review of systems was negative except for: Constitutional: positive for fatigue Gastrointestinal: positive for dyspepsia, nausea and Hiccups   PHYSICAL EXAMINATION: General appearance: alert, cooperative, fatigued and no distress Head: Normocephalic, without obvious abnormality, atraumatic Neck: no adenopathy, no JVD, supple, symmetrical, trachea midline and thyroid not enlarged, symmetric, no tenderness/mass/nodules Lymph nodes: Cervical, supraclavicular, and axillary nodes normal. Resp: clear to auscultation bilaterally Back: symmetric, no curvature. ROM normal. No CVA tenderness. Cardio: regular rate and rhythm, S1, S2 normal, no murmur, click, rub or gallop GI: soft, non-tender; bowel sounds normal; no masses,  no organomegaly Extremities: extremities normal, atraumatic, no cyanosis or edema  ECOG PERFORMANCE STATUS:  1 - Symptomatic but completely ambulatory  Blood pressure 108/78, pulse 85, temperature (!) 97.3 F (36.3 C), temperature source Tympanic, resp. rate 17, height 5' 6"  (1.676 m), weight 131 lb 3.2 oz (59.5 kg), SpO2 100 %.  LABORATORY DATA: Lab Results  Component Value Date   WBC 5.4 09/09/2019   HGB 14.1 09/09/2019   HCT 41.4 09/09/2019   MCV 85.5 09/09/2019   PLT 168 09/09/2019      Chemistry      Component Value Date/Time   NA 136 09/01/2019 0919   K 4.5 09/01/2019 0919   CL 101 09/01/2019 0919   CO2 27 09/01/2019 0919   BUN 15 09/01/2019 0919   CREATININE 1.04 09/01/2019 0919      Component Value Date/Time   CALCIUM 9.7 09/01/2019 0919   ALKPHOS 81 09/01/2019 0919   AST 9 (L) 09/01/2019 0919   ALT 6 09/01/2019 0919   BILITOT 0.6 09/01/2019 0919       RADIOGRAPHIC STUDIES: No results  found.  ASSESSMENT AND PLAN: This is a very pleasant 69 years old white male recently diagnosed with stage IIb (T1b, N1, M0) non-small cell lung cancer, adenocarcinoma status post right upper lobectomy with lymph node dissection under the care of Dr. Roxan Hockey on June 17, 2019. The patient had molecular studies on the alchemist clinical trial that showed negative for EGFR mutation as well as ALK gene translocation but PD-L1 expression in the range of 50-100%. The patient is currently undergoing standard adjuvant systemic chemotherapy with cisplatin 75 mg/M2 and Alimta 500 mg/M2 every 3 weeks status post 1 cycle started last week.  He tolerated the first cycle of his treatment well except for the fatigue, hiccups and mild nausea.  He is feeling much better after starting Thorazine. I recommended for the patient to continue his treatment and he is expected to start cycle #2 in 2 weeks. For the oral thrush, I will give him prescription for Diflucan 100 mg p.o. daily for 7 days. The patient will come back for follow-up visit in 2 weeks for evaluation before starting cycle #2. He was advised to call immediately if he has any concerning symptoms in the interval. The patient voices understanding of current disease status and treatment options and is in agreement with the current care plan.  All questions were answered. The patient knows to call the clinic with any problems, questions or concerns. We can certainly see the patient much sooner if necessary.  Disclaimer: This note was dictated with voice recognition software. Similar sounding words can inadvertently be transcribed and may not be corrected upon review.

## 2019-09-09 NOTE — Patient Outreach (Signed)
[facility]) Care Management  09/09/2019  Luis [name] [birth year] [id number]    Telephone assessment: placed call to patient with no answer. Left a message requesting a callback.   PLAN: will attempt again in 3 business days.  Tomasa Rand, RN, BSN, CEN [facility] [employer] [phone]

## 2019-09-10 ENCOUNTER — Ambulatory Visit: Payer: Self-pay

## 2019-09-14 ENCOUNTER — Other Ambulatory Visit: Payer: Self-pay

## 2019-09-14 NOTE — Patient Outreach (Signed)
[facility]) Care Management  09/14/2019  Luis [name] [birth year] [id number]   Telephone assessment:  Placed call to patient with no answer. Left a message requesting a call back.  PLAN: will call back in 3 days.  Tomasa Rand, RN, BSN, CEN [facility] [employer] [phone]

## 2019-09-16 ENCOUNTER — Other Ambulatory Visit: Payer: Self-pay

## 2019-09-16 ENCOUNTER — Inpatient Hospital Stay: Payer: Medicare Other | Attending: Internal Medicine

## 2019-09-16 ENCOUNTER — Encounter: Payer: Self-pay | Admitting: Internal Medicine

## 2019-09-16 ENCOUNTER — Inpatient Hospital Stay: Payer: Medicare Other

## 2019-09-16 DIAGNOSIS — J449 Chronic obstructive pulmonary disease, unspecified: Secondary | ICD-10-CM | POA: Diagnosis not present

## 2019-09-16 DIAGNOSIS — M1990 Unspecified osteoarthritis, unspecified site: Secondary | ICD-10-CM | POA: Diagnosis not present

## 2019-09-16 DIAGNOSIS — C3411 Malignant neoplasm of upper lobe, right bronchus or lung: Secondary | ICD-10-CM | POA: Insufficient documentation

## 2019-09-16 DIAGNOSIS — Z5111 Encounter for antineoplastic chemotherapy: Secondary | ICD-10-CM | POA: Insufficient documentation

## 2019-09-16 DIAGNOSIS — Z902 Acquired absence of lung [part of]: Secondary | ICD-10-CM | POA: Diagnosis not present

## 2019-09-16 DIAGNOSIS — Z79899 Other long term (current) drug therapy: Secondary | ICD-10-CM | POA: Diagnosis not present

## 2019-09-16 DIAGNOSIS — R112 Nausea with vomiting, unspecified: Secondary | ICD-10-CM | POA: Diagnosis not present

## 2019-09-16 DIAGNOSIS — C3491 Malignant neoplasm of unspecified part of right bronchus or lung: Secondary | ICD-10-CM

## 2019-09-16 DIAGNOSIS — I10 Essential (primary) hypertension: Secondary | ICD-10-CM | POA: Insufficient documentation

## 2019-09-16 DIAGNOSIS — K219 Gastro-esophageal reflux disease without esophagitis: Secondary | ICD-10-CM | POA: Insufficient documentation

## 2019-09-16 LAB — CMP (CANCER CENTER ONLY)
ALT: 6 U/L (ref 0–44)
AST: 7 U/L — ABNORMAL LOW (ref 15–41)
Albumin: 3.3 g/dL — ABNORMAL LOW (ref 3.5–5.0)
Alkaline Phosphatase: 77 U/L (ref 38–126)
Anion gap: 7 (ref 5–15)
BUN: 17 mg/dL (ref 8–23)
CO2: 28 mmol/L (ref 22–32)
Calcium: 9.7 mg/dL (ref 8.9–10.3)
Chloride: 99 mmol/L (ref 98–111)
Creatinine: 0.91 mg/dL (ref 0.61–1.24)
GFR, Est AFR Am: >60 mL/min (ref >60)
GFR, Estimated: >60 mL/min (ref >60)
Glucose, Bld: 77 mg/dL (ref 70–99)
Potassium: 4.8 mmol/L (ref 3.5–5.1)
Sodium: 134 mmol/L — ABNORMAL LOW (ref 135–145)
Total Bilirubin: 0.3 mg/dL (ref 0.3–1.2)
Total Protein: 6.5 g/dL (ref 6.5–8.1)

## 2019-09-16 LAB — CBC WITH DIFFERENTIAL (CANCER CENTER ONLY)
Abs Immature Granulocytes: 0 10*3/uL (ref 0.00–0.07)
Basophils Absolute: 0 10*3/uL (ref 0.0–0.1)
Basophils Relative: 0 %
Eosinophils Absolute: 0.2 10*3/uL (ref 0.0–0.5)
Eosinophils Relative: 7 %
HCT: 40.7 % (ref 39.0–52.0)
Hemoglobin: 14 g/dL (ref 13.0–17.0)
Immature Granulocytes: 0 %
Lymphocytes Relative: 44 %
Lymphs Abs: 1.4 10*3/uL (ref 0.7–4.0)
MCH: 29.6 pg (ref 26.0–34.0)
MCHC: 34.4 g/dL (ref 30.0–36.0)
MCV: 86 fL (ref 80.0–100.0)
Monocytes Absolute: 0.4 10*3/uL (ref 0.1–1.0)
Monocytes Relative: 13 %
Neutro Abs: 1.1 10*3/uL — ABNORMAL LOW (ref 1.7–7.7)
Neutrophils Relative %: 36 %
Platelet Count: 220 10*3/uL (ref 150–400)
RBC: 4.73 MIL/uL (ref 4.22–5.81)
RDW: 12.5 % (ref 11.5–15.5)
WBC Count: 3.1 10*3/uL — ABNORMAL LOW (ref 4.0–10.5)
nRBC: 0 % (ref 0.0–0.2)

## 2019-09-16 LAB — MAGNESIUM: Magnesium: 1.9 mg/dL (ref 1.7–2.4)

## 2019-09-17 ENCOUNTER — Other Ambulatory Visit: Payer: Self-pay

## 2019-09-17 NOTE — Patient Outreach (Signed)
[facility]) Care Management  09/17/2019  Luis [name] 27-Jan-1950 [id number]   Late entry: 09/14/2019  Wife of patient called back and reports patient is doing ok. Reports some initial problems with hiccups and nausea but much improved. Wife reports patient continues to have decreased energy levels. Napping often.    PLAN: reviewed with wife when to call MD. Encouraged increased activity to help with energy levels. Provided support for wife.  Will plan call back in 1 month for follow up. Encouraged wife to call me if needed.   Tomasa Rand, RN, BSN, CEN [facility] [employer] [phone]

## 2019-09-17 NOTE — Telephone Encounter (Signed)
Received couple of phone messages & MyChart messages about pt's nausea & need for something different.  Per Dr Julien Nordmann, pt to continue decadron 5 more days.  Called wife & this information was given.  Informed that steroids can help with nausea & informed of side effects & need to take with food.  She expressed understanding.

## 2019-09-21 ENCOUNTER — Telehealth: Payer: Self-pay | Admitting: Medical Oncology

## 2019-09-21 NOTE — Telephone Encounter (Signed)
-----   Message from Curt Bears, MD sent at 09/16/2019  5:25 PM EDT ----- Regarding: RE: nausea uncontrolled He can take the Decadron 4 mg p.o. twice daily for 5 more days after the treatment.  He already have prescription for the premedication. ----- Message ----- From: Ardeen [name], RN Sent: 09/16/2019   4:41 PM EDT To: Curt Bears, MD Subject: nausea uncontrolled                            'Hi.  I sent an earlier message (actually on the 1st at 6:56pm)  Luis [name]'s nausea is not being controlled by the zofran.    Some vomiting, but he's still trying to drink and eat.  Is there anything else he can take?    I tried giving him the compazine 3-4 hrs after taking the zofran, and he tells me that it didn't seem to make a difference.  We have gatorade, gingerale, caffeine mt dew.  Pepto bismol, zofran, compazine.Marland KitchenMarland KitchenMarland KitchenMarland Kitchenplease advise soon.'

## 2019-09-21 NOTE — Telephone Encounter (Signed)
No answer

## 2019-09-21 NOTE — Progress Notes (Signed)
[place] OFFICE PROGRESS NOTE  [name], MD [address] [place] [address]  DIAGNOSIS: Stage IIB(T1b, N1, M0) non-small cell lung cancer, invasive poorly differentiated carcinoma diagnosed in June 2021. Molecular studies are negative for EGFR mutation as well as ALK gene translocation. PD-L1 expression was 50-100%. This isbased on the report from [facility] trial  PRIOR THERAPY: Status post right upper lobectomy with lymph node dissection on June 17, 2019 under the care of Dr. Roxan Hockey.  CURRENT THERAPY: Adjuvant systemic chemotherapy with cisplatin 75 mg/M2 and Alimta 500 mg/M2 every 3 weeks. First dose September 02, 2019. Status post 1 cycle.   INTERVAL HISTORY: Luis [name] 69 y.o. male returns to the clinic today for a follow up visit. The patient is feeling fair today but he had intermittent nausea/vomiting every other day this week. He has been taking zofran without much improvement in his symptoms. He last threw up 1x last night but also attributes that to eating a large dinner. He only experienced mild nausea the week following his first cycle of treatment. He took his decadron premedication as prescribed yesterday and this morning. He denies fevers or chills. He reports his baseline intermittent night sweats. He reports his baseline mild dyspnea on exertion and productive cough. He reports indigestion with eating for which he takes protonix. Denies hemoptysis. His history of chronic diarrhea has improved and he has found himself actually needing to take a stool softener every 3 days or so.  He deneis any headaches or visual changes. He is here for evaluation before starting cycle #2.   MEDICAL HISTORY: Past Medical History:  Diagnosis Date  . Arthritis    through out body  . Cancer ([place])   . Chronic bronchitis ([place])   . Concussion   . COPD (chronic obstructive pulmonary disease) ([place])   . Dyspnea   . Emphysema lung ([place])   .  Frozen shoulder    LEFT  . GERD (gastroesophageal reflux disease)   . Hard of hearing   . Headache    alot of days, related to spine issues  . Hypertension   . Lumbar radiculopathy 2013  . Peyronie's disease   . Prostatitis 2011   Dr. Gaynelle Arabian  . RLS (restless legs syndrome)   . Shingles 06/13/2018   shingles on back, finished with prednisone, stills has scabs and pain  . Thigh pain    Bilateral Thigh  . Thigh pain 10/13   bilateral  . Tobacco abuse 2010   still uses electronic cig/ emphysema, SOB easily  . Wears partial dentures    upper    ALLERGIES:  is allergic to doxycycline and tramadol.  MEDICATIONS:  Current Outpatient Medications  Medication Sig Dispense Refill  . acetaminophen (TYLENOL) 650 MG CR tablet Take 650 mg by mouth 2 (two) times daily as needed for pain.    . chlorproMAZINE (THORAZINE) 25 MG tablet Take 1 tablet (25 mg total) by mouth 3 (three) times daily as needed for hiccoughs. 30 tablet 0  . Cholecalciferol (VITAMIN D) 125 MCG (5000 UT) CAPS Take 5,000 Units by mouth daily.    . citalopram (CELEXA) 20 MG tablet Take 20 mg by mouth daily.    [name] dexamethasone (DECADRON) 4 MG tablet 4 mg p.o. twice daily the day before, day of and day after chemotherapy every 3 weeks. 40 tablet 0  . docusate sodium (COLACE) 100 MG capsule Take 100-200 mg by mouth 2 (two) times daily as needed for mild  constipation.    . fluconazole (DIFLUCAN) 100 MG tablet Take 1 tablet (100 mg total) by mouth daily. 7 tablet 0  . folic acid (FOLVITE) 1 MG tablet Take 1 tablet (1 mg total) by mouth daily. 30 tablet 4  . guaiFENesin (MUCINEX) 600 MG 12 hr tablet Take 1 tablet (600 mg total) by mouth 2 (two) times daily as needed for cough or to loosen phlegm.    . hydrocortisone cream 1 % Apply 1 application topically daily. Skin dermatitis    . ondansetron (ZOFRAN) 8 MG tablet Take 1 tablet (8 mg total) by mouth every 8 (eight) hours as needed for nausea or vomiting. 20 tablet 0  .  pantoprazole (PROTONIX) 20 MG tablet Take 20 mg by mouth every evening.    . Tiotropium Bromide-Olodaterol (STIOLTO RESPIMAT) 2.5-2.5 MCG/ACT AERS Inhale 2 puffs into the lungs daily. 4 g 11  . valsartan-hydrochlorothiazide (DIOVAN-HCT) 80-12.5 MG tablet Take 1 tablet by mouth daily.    . vitamin B-12 (CYANOCOBALAMIN) 1000 MCG tablet Take 2,000 mcg by mouth daily.     No current facility-administered medications for this visit.    SURGICAL HISTORY:  Past Surgical History:  Procedure Laterality Date  . CATARACT EXTRACTION W/PHACO Right 07/08/2018   Procedure: CATARACT EXTRACTION PHACO AND INTRAOCULAR LENS PLACEMENT ([place]) RIGHT;  Surgeon: Leandrew Koyanagi, MD;  Location: [place];  Service: Ophthalmology;  Laterality: Right;  . CATARACT EXTRACTION W/PHACO Left 07/29/2018   Procedure: CATARACT EXTRACTION PHACO AND INTRAOCULAR LENS PLACEMENT ([place]) LEFT TORIC LENS;  Surgeon: Leandrew Koyanagi, MD;  Location: [place];  Service: Ophthalmology;  Laterality: Left;  . CHEST TUBE INSERTION Right 07/01/2019   Procedure: CHEST TUBE INSERTION;  Surgeon: [name], MD;  Location: [place];  Service: Thoracic;  Laterality: Right;  . COLONOSCOPY    . EYE SURGERY    . INTERCOSTAL NERVE BLOCK Right 06/17/2019   Procedure: INTERCOSTAL NERVE BLOCK;  Surgeon: [name], MD;  Location: [place];  Service: Thoracic;  Laterality: Right;  . INTERCOSTAL NERVE BLOCK Right 07/01/2019   Procedure: INTERCOSTAL NERVE BLOCK;  Surgeon: [name], MD;  Location: [facility];  Service: Thoracic;  Laterality: Right;  . TONSILLECTOMY    . VIDEO ASSISTED THORACOSCOPY Right 07/01/2019   Procedure: VIDEO ASSISTED THORACOSCOPY - REMOVAL OF RIGHT MIDDLE LOBE BLEBS;  Surgeon: [name], MD;  Location: [place];  Service: Thoracic;  Laterality: Right;  [name] VIDEO BRONCHOSCOPY WITH INSERTION OF INTERBRONCHIAL VALVE (IBV) Right 06/25/2019   Procedure: VIDEO BRONCHOSCOPY WITH INSERTION  OF INTERBRONCHIAL VALVE (IBV); Size 7 IBV into Right Loer Lobe (RLL) Superior Segment, Size 9 IBV into RLL Basilar Segment;  Surgeon: [name], MD;  Location: [facility];  Service: Thoracic;  Laterality: Right;  [name] VIDEO BRONCHOSCOPY WITH INSERTION OF INTERBRONCHIAL VALVE (IBV) N/A 08/06/2019   Procedure: VIDEO BRONCHOSCOPY WITH REMOVAL OF INTERBRONCHIAL VALVE (IBV);  Surgeon: [name], MD;  Location: [facility];  Service: Thoracic;  Laterality: N/A;    REVIEW OF SYSTEMS:   Review of Systems  Constitutional:Positive for night sweats.Negative for appetite change, chills, fatigue, fever and unexpected weight change.  HENT: Negative for mouth sores, nosebleeds, sore throat and trouble swallowing.  Eyes: Negative for eye problems and icterus.  Respiratory:Positive for baseline dry cough and shortness of breath with exertion.Negative for hemoptysis and wheezing.  Cardiovascular:  Negative for chest pain or leg swelling.  Gastrointestinal: Positive for nausea and vomiting this week. Negative for abdominal pain, constipation, or diarrhea.  Genitourinary: Negative for bladder incontinence, difficulty urinating, dysuria, frequency and hematuria.  Musculoskeletal: Negative for back pain, gait problem, neck pain and neck stiffness.  Skin: Negative for itching and rash.  Neurological:Positive for chronic headaches.Negative for dizziness, extremity weakness, gait problem, light-headedness and seizures.  Hematological: Negative for adenopathy. Does not bruise/bleed easily.  Psychiatric/Behavioral: Negative for confusion, depression and sleep disturbance. The patient is not nervous/anxious.     PHYSICAL EXAMINATION:  Blood pressure (!) 154/76, pulse 84, temperature 97.6 F (36.4 C), temperature source Tympanic, resp. rate 20, height 5' 6"  (1.676 m), weight 132 lb (59.9 kg), SpO2 100 %.  ECOG PERFORMANCE STATUS: 1 - Symptomatic but completely ambulatory  Physical Exam   Constitutional: Oriented to person, place, and time and well-developed, well-nourished, and in no distress. HENT:  Head: Normocephalic and atraumatic.  Mouth/Throat: Oropharynx is clear and moist. No oropharyngeal exudate.  Eyes: Conjunctivae are normal. Right eye exhibits no discharge. Left eye exhibits no discharge. No scleral icterus.  Neck: Normal range of motion. Neck supple.  Cardiovascular: Normal rate, regular rhythm, normal heart sounds and intact distal pulses.   Pulmonary/Chest: Effort normal and breath sounds normal. No respiratory distress. No wheezes. No rales.  Abdominal: Soft. Bowel sounds are normal. Exhibits no distension and no mass. There is no tenderness.  Musculoskeletal: Normal range of motion. Exhibits no edema.  Lymphadenopathy:    No cervical adenopathy.  Neurological: Alert and oriented to person, place, and time. Exhibits normal muscle tone. Gait normal. Coordination normal.  Skin: Skin is warm and dry. No rash noted. Not diaphoretic. No erythema. No pallor.  Psychiatric: Mood, memory and judgment normal.  Vitals reviewed.  LABORATORY DATA: Lab Results  Component Value Date   WBC 4.1 09/23/2019   HGB 13.3 09/23/2019   HCT 38.9 (L) 09/23/2019   MCV 83.8 09/23/2019   PLT 535 (H) 09/23/2019      Chemistry      Component Value Date/Time   NA 133 (L) 09/23/2019 0828   K 3.5 09/23/2019 0828   CL 98 09/23/2019 0828   CO2 25 09/23/2019 0828   BUN 13 09/23/2019 0828   CREATININE 1.11 09/23/2019 0828      Component Value Date/Time   CALCIUM 9.3 09/23/2019 0828   ALKPHOS 91 09/23/2019 0828   AST 22 09/23/2019 0828   ALT 28 09/23/2019 0828   BILITOT 0.3 09/23/2019 0828       RADIOGRAPHIC STUDIES:  No results found.   ASSESSMENT/PLAN:  This is a very pleasant 69 year old Caucasian male recently diagnosed with stage IIb (T1b, N1, M0) non-small cell lung cancer, adenocarcinoma.  He is status post a right upper lobe lobectomy with lymph node  dissection under the care of Dr. Roxan Hockey.  This was performed on June 17, 2019.  He has no actionable mutations.  His PD-L1 expression was estimated to be in the range of 50 to 100% by the molecular studies performed by the [facility]chemist clinical trial.   The patient is currently undergoing systemic adjuvant chemotherapy with cisplatin 75 mg per metered square and Alimta 500 mg/m.  He is status post 1 cycle and has some delayed nausea/vomiting.   The patient was seen with Dr. Julien Nordmann. Labs were reviewed. Dr. Julien Nordmann recommends he proceed with cycle #2 today as scheduled. He was advised to extend his decadron by 3-4 days with this cycle of treatment.   We will see him back for a follow up visit in 3 weeks for evaluation before starting cycle #3.   The  patient was advised to call immediately if he has any concerning symptoms in the interval. The patient voices understanding of current disease status and treatment options and is in agreement with the current care plan. All questions were answered. The patient knows to call the clinic with any problems, questions or concerns. We can certainly see the patient much sooner if necessary  No orders of the defined types were placed in this encounter.    Blessed Girdner L Atavia Poppe, PA-C 09/23/19  ADDENDUM: Hematology/Oncology Attending: I had a face-to-face encounter with the patient today.  I recommended his care plan.  This is a very pleasant 69 years old with stage IIb non-small cell lung cancer, adenocarcinoma with no actionable mutations and PD-L1 expression in the range of 50-100%.  The patient is status post right upper lobectomy with lymph node dissection and he is currently undergoing adjuvant systemic chemotherapy with cisplatin and Alimta status post 1 cycle.  He tolerated the first cycle of his treatment well except for the delayed nausea and vomiting as well as fatigue.  He is currently on treatment with Compazine and Zofran.  He continues to  have intermittent nausea. I recommended for the patient to proceed with cycle #2 today but I also advised him to extend his treatment with Decadron 4 mg p.o. twice daily for 4 more days after the treatment. He was advised to call immediately if he has any concerning symptoms or persistent nausea after the treatment. He will come back for follow-up visit in 3 weeks for evaluation before the next cycle of his treatment.  Disclaimer: This note was dictated with voice recognition software. Similar sounding words can inadvertently be transcribed and may be missed upon review. Eilleen Kempf, MD 09/23/19

## 2019-09-23 ENCOUNTER — Encounter: Payer: Self-pay | Admitting: Physician Assistant

## 2019-09-23 ENCOUNTER — Inpatient Hospital Stay: Payer: Medicare Other

## 2019-09-23 ENCOUNTER — Other Ambulatory Visit: Payer: Self-pay

## 2019-09-23 ENCOUNTER — Inpatient Hospital Stay (HOSPITAL_BASED_OUTPATIENT_CLINIC_OR_DEPARTMENT_OTHER): Payer: Medicare Other | Admitting: Physician Assistant

## 2019-09-23 VITALS — BP 154/76 | HR 84 | Temp 97.6°F | Resp 20 | Ht 66.0 in | Wt 132.0 lb

## 2019-09-23 DIAGNOSIS — J449 Chronic obstructive pulmonary disease, unspecified: Secondary | ICD-10-CM | POA: Diagnosis not present

## 2019-09-23 DIAGNOSIS — Z5111 Encounter for antineoplastic chemotherapy: Secondary | ICD-10-CM | POA: Diagnosis not present

## 2019-09-23 DIAGNOSIS — R112 Nausea with vomiting, unspecified: Secondary | ICD-10-CM | POA: Diagnosis not present

## 2019-09-23 DIAGNOSIS — C3491 Malignant neoplasm of unspecified part of right bronchus or lung: Secondary | ICD-10-CM

## 2019-09-23 DIAGNOSIS — M1990 Unspecified osteoarthritis, unspecified site: Secondary | ICD-10-CM | POA: Diagnosis not present

## 2019-09-23 DIAGNOSIS — C3411 Malignant neoplasm of upper lobe, right bronchus or lung: Secondary | ICD-10-CM | POA: Diagnosis not present

## 2019-09-23 DIAGNOSIS — Z902 Acquired absence of lung [part of]: Secondary | ICD-10-CM | POA: Diagnosis not present

## 2019-09-23 LAB — CMP (CANCER CENTER ONLY)
ALT: 28 U/L (ref 0–44)
AST: 22 U/L (ref 15–41)
Albumin: 3.5 g/dL (ref 3.5–5.0)
Alkaline Phosphatase: 91 U/L (ref 38–126)
Anion gap: 10 (ref 5–15)
BUN: 13 mg/dL (ref 8–23)
CO2: 25 mmol/L (ref 22–32)
Calcium: 9.3 mg/dL (ref 8.9–10.3)
Chloride: 98 mmol/L (ref 98–111)
Creatinine: 1.11 mg/dL (ref 0.61–1.24)
GFR, Est AFR Am: >60 mL/min (ref >60)
GFR, Estimated: >60 mL/min (ref >60)
Glucose, Bld: 194 mg/dL — ABNORMAL HIGH (ref 70–99)
Potassium: 3.5 mmol/L (ref 3.5–5.1)
Sodium: 133 mmol/L — ABNORMAL LOW (ref 135–145)
Total Bilirubin: 0.3 mg/dL (ref 0.3–1.2)
Total Protein: 6.6 g/dL (ref 6.5–8.1)

## 2019-09-23 LAB — CBC WITH DIFFERENTIAL (CANCER CENTER ONLY)
Abs Immature Granulocytes: 0.12 10*3/uL — ABNORMAL HIGH (ref 0.00–0.07)
Basophils Absolute: 0 10*3/uL (ref 0.0–0.1)
Basophils Relative: 1 %
Eosinophils Absolute: 0 10*3/uL (ref 0.0–0.5)
Eosinophils Relative: 0 %
HCT: 38.9 % — ABNORMAL LOW (ref 39.0–52.0)
Hemoglobin: 13.3 g/dL (ref 13.0–17.0)
Immature Granulocytes: 3 %
Lymphocytes Relative: 40 %
Lymphs Abs: 1.7 10*3/uL (ref 0.7–4.0)
MCH: 28.7 pg (ref 26.0–34.0)
MCHC: 34.2 g/dL (ref 30.0–36.0)
MCV: 83.8 fL (ref 80.0–100.0)
Monocytes Absolute: 0.8 10*3/uL (ref 0.1–1.0)
Monocytes Relative: 19 %
Neutro Abs: 1.5 10*3/uL — ABNORMAL LOW (ref 1.7–7.7)
Neutrophils Relative %: 37 %
Platelet Count: 535 10*3/uL — ABNORMAL HIGH (ref 150–400)
RBC: 4.64 MIL/uL (ref 4.22–5.81)
RDW: 13.1 % (ref 11.5–15.5)
WBC Count: 4.1 10*3/uL (ref 4.0–10.5)
nRBC: 0 % (ref 0.0–0.2)

## 2019-09-23 LAB — MAGNESIUM: Magnesium: 1.8 mg/dL (ref 1.7–2.4)

## 2019-09-23 MED ORDER — SODIUM CHLORIDE 0.9 % IV SOLN
Freq: Once | INTRAVENOUS | Status: AC
  Filled 2019-09-23: qty 10

## 2019-09-23 MED ORDER — SODIUM CHLORIDE 0.9 % IV SOLN
10.0000 mg | Freq: Once | INTRAVENOUS | Status: AC
  Administered 2019-09-23: 10 mg via INTRAVENOUS
  Filled 2019-09-23: qty 10

## 2019-09-23 MED ORDER — SODIUM CHLORIDE 0.9 % IV SOLN
75.0000 mg/m2 | Freq: Once | INTRAVENOUS | Status: AC
  Administered 2019-09-23: 125 mg via INTRAVENOUS
  Filled 2019-09-23: qty 125

## 2019-09-23 MED ORDER — SODIUM CHLORIDE 0.9 % IV SOLN
150.0000 mg | Freq: Once | INTRAVENOUS | Status: AC
  Administered 2019-09-23: 150 mg via INTRAVENOUS
  Filled 2019-09-23: qty 150

## 2019-09-23 MED ORDER — SODIUM CHLORIDE 0.9 % IV SOLN
500.0000 mg/m2 | Freq: Once | INTRAVENOUS | Status: AC
  Administered 2019-09-23: 800 mg via INTRAVENOUS
  Filled 2019-09-23: qty 20

## 2019-09-23 MED ORDER — PALONOSETRON HCL INJECTION 0.25 MG/5ML
0.2500 mg | Freq: Once | INTRAVENOUS | Status: AC
  Administered 2019-09-23: 0.25 mg via INTRAVENOUS

## 2019-09-23 MED ORDER — SODIUM CHLORIDE 0.9 % IV SOLN
Freq: Once | INTRAVENOUS | Status: AC
  Filled 2019-09-23: qty 250

## 2019-09-23 MED ORDER — PALONOSETRON HCL INJECTION 0.25 MG/5ML
INTRAVENOUS | Status: AC
  Filled 2019-09-23: qty 5

## 2019-09-23 NOTE — Patient Instructions (Signed)
[facility] Discharge Instructions for Patients Receiving Chemotherapy  Today you received the following chemotherapy agents Pemetrexed (ALIMTA) & Cisplatin (PLATINOL).  To help prevent nausea and vomiting after your treatment, we encourage you to take your nausea medication as prescribed.  If you develop nausea and vomiting that is not controlled by your nausea medication, call the clinic.   BELOW ARE SYMPTOMS THAT SHOULD BE REPORTED IMMEDIATELY:  *FEVER GREATER THAN 100.5 F  *CHILLS WITH OR WITHOUT FEVER  NAUSEA AND VOMITING THAT IS NOT CONTROLLED WITH YOUR NAUSEA MEDICATION  *UNUSUAL SHORTNESS OF BREATH  *UNUSUAL BRUISING OR BLEEDING  TENDERNESS IN MOUTH AND THROAT WITH OR WITHOUT PRESENCE OF ULCERS  *URINARY PROBLEMS  *BOWEL PROBLEMS  UNUSUAL RASH Items with * indicate a potential emergency and should be followed up as soon as possible.  Feel free to call the clinic should you have any questions or concerns. The clinic phone number is [phone].  Please show the [place] at check-in to the Emergency Department and triage nurse.

## 2019-09-29 ENCOUNTER — Encounter: Payer: Self-pay | Admitting: Medical Oncology

## 2019-09-29 ENCOUNTER — Other Ambulatory Visit: Payer: Self-pay | Admitting: Physician Assistant

## 2019-09-29 ENCOUNTER — Telehealth: Payer: Self-pay

## 2019-09-29 DIAGNOSIS — R112 Nausea with vomiting, unspecified: Secondary | ICD-10-CM

## 2019-09-29 MED ORDER — OLANZAPINE 5 MG PO TABS: 5.0000 mg | ORAL_TABLET | Freq: Every day | ORAL | 1 refills | Status: DC

## 2019-09-29 NOTE — Telephone Encounter (Signed)
patients wife called stating that the last few day the patient has been experiencing nausea and vomiting. she said that she has given him phenergan suppository but nothing is working. thinks he may need fluids. Spoke with cassie PA about patient concern. Called wife back and let her know that per [facility] PA, She is  sending in a prescription of zyprexa to take at night. Checked with infusion and [facility] to see who has time for fluids but unfortunately we are booked at the moment. Let wife know that if anything changes I will give her a call back regarding scheduling him to receive some fluids today but for him to try to hydrate himself as much as possible at home. Also let wife know that he is NOT TO TAKE thorazine WITH zyprexa. Wife verbalized understanding! also stated that she would call back if anything changes.

## 2019-09-30 ENCOUNTER — Other Ambulatory Visit: Payer: Self-pay

## 2019-09-30 ENCOUNTER — Inpatient Hospital Stay: Payer: Medicare Other

## 2019-09-30 DIAGNOSIS — R112 Nausea with vomiting, unspecified: Secondary | ICD-10-CM | POA: Diagnosis not present

## 2019-09-30 DIAGNOSIS — M1990 Unspecified osteoarthritis, unspecified site: Secondary | ICD-10-CM | POA: Diagnosis not present

## 2019-09-30 DIAGNOSIS — C3411 Malignant neoplasm of upper lobe, right bronchus or lung: Secondary | ICD-10-CM | POA: Diagnosis not present

## 2019-09-30 DIAGNOSIS — J449 Chronic obstructive pulmonary disease, unspecified: Secondary | ICD-10-CM | POA: Diagnosis not present

## 2019-09-30 DIAGNOSIS — Z902 Acquired absence of lung [part of]: Secondary | ICD-10-CM | POA: Diagnosis not present

## 2019-09-30 DIAGNOSIS — Z5111 Encounter for antineoplastic chemotherapy: Secondary | ICD-10-CM | POA: Diagnosis not present

## 2019-09-30 DIAGNOSIS — C3491 Malignant neoplasm of unspecified part of right bronchus or lung: Secondary | ICD-10-CM

## 2019-09-30 LAB — CBC WITH DIFFERENTIAL (CANCER CENTER ONLY)
Abs Immature Granulocytes: 0.04 10*3/uL (ref 0.00–0.07)
Basophils Absolute: 0 10*3/uL (ref 0.0–0.1)
Basophils Relative: 0 %
Eosinophils Absolute: 0.2 10*3/uL (ref 0.0–0.5)
Eosinophils Relative: 2 %
HCT: 42 % (ref 39.0–52.0)
Hemoglobin: 14.8 g/dL (ref 13.0–17.0)
Immature Granulocytes: 1 %
Lymphocytes Relative: 34 %
Lymphs Abs: 2.2 10*3/uL (ref 0.7–4.0)
MCH: 29.6 pg (ref 26.0–34.0)
MCHC: 35.2 g/dL (ref 30.0–36.0)
MCV: 84 fL (ref 80.0–100.0)
Monocytes Absolute: 0.2 10*3/uL (ref 0.1–1.0)
Monocytes Relative: 3 %
Neutro Abs: 3.9 10*3/uL (ref 1.7–7.7)
Neutrophils Relative %: 60 %
Platelet Count: 217 10*3/uL (ref 150–400)
RBC: 5 MIL/uL (ref 4.22–5.81)
RDW: 13.2 % (ref 11.5–15.5)
WBC Count: 6.6 10*3/uL (ref 4.0–10.5)
nRBC: 0 % (ref 0.0–0.2)

## 2019-09-30 LAB — CMP (CANCER CENTER ONLY)
ALT: 16 U/L (ref 0–44)
AST: 11 U/L — ABNORMAL LOW (ref 15–41)
Albumin: 3.4 g/dL — ABNORMAL LOW (ref 3.5–5.0)
Alkaline Phosphatase: 82 U/L (ref 38–126)
Anion gap: 11 (ref 5–15)
BUN: 21 mg/dL (ref 8–23)
CO2: 26 mmol/L (ref 22–32)
Calcium: 8.8 mg/dL — ABNORMAL LOW (ref 8.9–10.3)
Chloride: 96 mmol/L — ABNORMAL LOW (ref 98–111)
Creatinine: 1.16 mg/dL (ref 0.61–1.24)
GFR, Est AFR Am: >60 mL/min (ref >60)
GFR, Estimated: >60 mL/min (ref >60)
Glucose, Bld: 82 mg/dL (ref 70–99)
Potassium: 3.9 mmol/L (ref 3.5–5.1)
Sodium: 133 mmol/L — ABNORMAL LOW (ref 135–145)
Total Bilirubin: 0.5 mg/dL (ref 0.3–1.2)
Total Protein: 6.7 g/dL (ref 6.5–8.1)

## 2019-09-30 LAB — MAGNESIUM: Magnesium: 1.8 mg/dL (ref 1.7–2.4)

## 2019-10-07 ENCOUNTER — Encounter: Payer: Self-pay | Admitting: Internal Medicine

## 2019-10-07 ENCOUNTER — Other Ambulatory Visit: Payer: Self-pay

## 2019-10-07 ENCOUNTER — Inpatient Hospital Stay: Payer: Medicare Other

## 2019-10-07 ENCOUNTER — Telehealth: Payer: Self-pay | Admitting: *Deleted

## 2019-10-07 DIAGNOSIS — Z5111 Encounter for antineoplastic chemotherapy: Secondary | ICD-10-CM | POA: Diagnosis not present

## 2019-10-07 DIAGNOSIS — R112 Nausea with vomiting, unspecified: Secondary | ICD-10-CM | POA: Diagnosis not present

## 2019-10-07 DIAGNOSIS — J449 Chronic obstructive pulmonary disease, unspecified: Secondary | ICD-10-CM | POA: Diagnosis not present

## 2019-10-07 DIAGNOSIS — C3411 Malignant neoplasm of upper lobe, right bronchus or lung: Secondary | ICD-10-CM | POA: Diagnosis not present

## 2019-10-07 DIAGNOSIS — M1990 Unspecified osteoarthritis, unspecified site: Secondary | ICD-10-CM | POA: Diagnosis not present

## 2019-10-07 DIAGNOSIS — C3491 Malignant neoplasm of unspecified part of right bronchus or lung: Secondary | ICD-10-CM

## 2019-10-07 DIAGNOSIS — Z902 Acquired absence of lung [part of]: Secondary | ICD-10-CM | POA: Diagnosis not present

## 2019-10-07 LAB — CMP (CANCER CENTER ONLY)
ALT: 10 U/L (ref 0–44)
AST: 8 U/L — ABNORMAL LOW (ref 15–41)
Albumin: 3.3 g/dL — ABNORMAL LOW (ref 3.5–5.0)
Alkaline Phosphatase: 79 U/L (ref 38–126)
Anion gap: 4 — ABNORMAL LOW (ref 5–15)
BUN: 17 mg/dL (ref 8–23)
CO2: 30 mmol/L (ref 22–32)
Calcium: 9.6 mg/dL (ref 8.9–10.3)
Chloride: 101 mmol/L (ref 98–111)
Creatinine: 0.93 mg/dL (ref 0.61–1.24)
GFR, Est AFR Am: >60 mL/min (ref >60)
GFR, Estimated: >60 mL/min (ref >60)
Glucose, Bld: 113 mg/dL — ABNORMAL HIGH (ref 70–99)
Potassium: 5.8 mmol/L — ABNORMAL HIGH (ref 3.5–5.1)
Sodium: 135 mmol/L (ref 135–145)
Total Bilirubin: 0.4 mg/dL (ref 0.3–1.2)
Total Protein: 6.7 g/dL (ref 6.5–8.1)

## 2019-10-07 LAB — CBC WITH DIFFERENTIAL (CANCER CENTER ONLY)
Abs Immature Granulocytes: 0.02 10*3/uL (ref 0.00–0.07)
Basophils Absolute: 0 10*3/uL (ref 0.0–0.1)
Basophils Relative: 0 %
Eosinophils Absolute: 0.2 10*3/uL (ref 0.0–0.5)
Eosinophils Relative: 4 %
HCT: 38.8 % — ABNORMAL LOW (ref 39.0–52.0)
Hemoglobin: 13 g/dL (ref 13.0–17.0)
Immature Granulocytes: 1 %
Lymphocytes Relative: 25 %
Lymphs Abs: 1.1 10*3/uL (ref 0.7–4.0)
MCH: 28.5 pg (ref 26.0–34.0)
MCHC: 33.5 g/dL (ref 30.0–36.0)
MCV: 85.1 fL (ref 80.0–100.0)
Monocytes Absolute: 0.4 10*3/uL (ref 0.1–1.0)
Monocytes Relative: 8 %
Neutro Abs: 2.7 10*3/uL (ref 1.7–7.7)
Neutrophils Relative %: 62 %
Platelet Count: 137 10*3/uL — ABNORMAL LOW (ref 150–400)
RBC: 4.56 MIL/uL (ref 4.22–5.81)
RDW: 13.2 % (ref 11.5–15.5)
WBC Count: 4.4 10*3/uL (ref 4.0–10.5)
nRBC: 0 % (ref 0.0–0.2)

## 2019-10-07 LAB — MAGNESIUM: Magnesium: 1.8 mg/dL (ref 1.7–2.4)

## 2019-10-07 NOTE — Telephone Encounter (Signed)
-----   Message from Curt Bears, MD sent at 10/07/2019 12:03 PM EDT ----- Please make sure that the patient is not taking any potassium supplements.  He also may need to cut on any potassium rich diet.  We will need to repeat his potassium level tomorrow.  Thank you. ----- Message ----- From: Buel Ream, Lab In [place] Sent: 10/07/2019  10:22 AM EDT To: Curt Bears, MD

## 2019-10-07 NOTE — Telephone Encounter (Signed)
Notified of message below. Does not take any oral potassium. Pt drinks a lot of Gatorade. Will not drink any tonight and will come for labs tomorrow at 1000.

## 2019-10-08 ENCOUNTER — Other Ambulatory Visit: Payer: Self-pay

## 2019-10-08 ENCOUNTER — Inpatient Hospital Stay: Payer: Medicare Other

## 2019-10-08 DIAGNOSIS — J449 Chronic obstructive pulmonary disease, unspecified: Secondary | ICD-10-CM | POA: Diagnosis not present

## 2019-10-08 DIAGNOSIS — C3491 Malignant neoplasm of unspecified part of right bronchus or lung: Secondary | ICD-10-CM

## 2019-10-08 DIAGNOSIS — Z5111 Encounter for antineoplastic chemotherapy: Secondary | ICD-10-CM | POA: Diagnosis not present

## 2019-10-08 DIAGNOSIS — M1990 Unspecified osteoarthritis, unspecified site: Secondary | ICD-10-CM | POA: Diagnosis not present

## 2019-10-08 DIAGNOSIS — C3411 Malignant neoplasm of upper lobe, right bronchus or lung: Secondary | ICD-10-CM | POA: Diagnosis not present

## 2019-10-08 DIAGNOSIS — R112 Nausea with vomiting, unspecified: Secondary | ICD-10-CM | POA: Diagnosis not present

## 2019-10-08 DIAGNOSIS — Z902 Acquired absence of lung [part of]: Secondary | ICD-10-CM | POA: Diagnosis not present

## 2019-10-08 LAB — CMP (CANCER CENTER ONLY)
ALT: 11 U/L (ref 0–44)
AST: 10 U/L — ABNORMAL LOW (ref 15–41)
Albumin: 3.5 g/dL (ref 3.5–5.0)
Alkaline Phosphatase: 82 U/L (ref 38–126)
Anion gap: 6 (ref 5–15)
BUN: 15 mg/dL (ref 8–23)
CO2: 32 mmol/L (ref 22–32)
Calcium: 9.8 mg/dL (ref 8.9–10.3)
Chloride: 95 mmol/L — ABNORMAL LOW (ref 98–111)
Creatinine: 0.89 mg/dL (ref 0.61–1.24)
GFR, Est AFR Am: >60 mL/min (ref >60)
GFR, Estimated: >60 mL/min (ref >60)
Glucose, Bld: 76 mg/dL (ref 70–99)
Potassium: 5 mmol/L (ref 3.5–5.1)
Sodium: 133 mmol/L — ABNORMAL LOW (ref 135–145)
Total Bilirubin: 0.4 mg/dL (ref 0.3–1.2)
Total Protein: 7 g/dL (ref 6.5–8.1)

## 2019-10-08 LAB — CBC WITH DIFFERENTIAL (CANCER CENTER ONLY)
Abs Immature Granulocytes: 0.03 10*3/uL (ref 0.00–0.07)
Basophils Absolute: 0 10*3/uL (ref 0.0–0.1)
Basophils Relative: 0 %
Eosinophils Absolute: 0.2 10*3/uL (ref 0.0–0.5)
Eosinophils Relative: 4 %
HCT: 39.2 % (ref 39.0–52.0)
Hemoglobin: 13.5 g/dL (ref 13.0–17.0)
Immature Granulocytes: 1 %
Lymphocytes Relative: 28 %
Lymphs Abs: 1.5 10*3/uL (ref 0.7–4.0)
MCH: 28.6 pg (ref 26.0–34.0)
MCHC: 34.4 g/dL (ref 30.0–36.0)
MCV: 83.1 fL (ref 80.0–100.0)
Monocytes Absolute: 0.6 10*3/uL (ref 0.1–1.0)
Monocytes Relative: 11 %
Neutro Abs: 3 10*3/uL (ref 1.7–7.7)
Neutrophils Relative %: 56 %
Platelet Count: 196 10*3/uL (ref 150–400)
RBC: 4.72 MIL/uL (ref 4.22–5.81)
RDW: 13.3 % (ref 11.5–15.5)
WBC Count: 5.3 10*3/uL (ref 4.0–10.5)
nRBC: 0 % (ref 0.0–0.2)

## 2019-10-08 LAB — MAGNESIUM: Magnesium: 1.9 mg/dL (ref 1.7–2.4)

## 2019-10-11 DIAGNOSIS — J449 Chronic obstructive pulmonary disease, unspecified: Secondary | ICD-10-CM | POA: Diagnosis not present

## 2019-10-11 DIAGNOSIS — J441 Chronic obstructive pulmonary disease with (acute) exacerbation: Secondary | ICD-10-CM | POA: Diagnosis not present

## 2019-10-14 ENCOUNTER — Other Ambulatory Visit: Payer: Self-pay

## 2019-10-14 ENCOUNTER — Inpatient Hospital Stay: Payer: Medicare Other

## 2019-10-14 ENCOUNTER — Inpatient Hospital Stay (HOSPITAL_BASED_OUTPATIENT_CLINIC_OR_DEPARTMENT_OTHER): Payer: Medicare Other | Admitting: Internal Medicine

## 2019-10-14 ENCOUNTER — Encounter: Payer: Self-pay | Admitting: Internal Medicine

## 2019-10-14 VITALS — BP 131/72 | HR 86 | Temp 97.0°F | Resp 18 | Ht 66.0 in | Wt 135.9 lb

## 2019-10-14 DIAGNOSIS — R112 Nausea with vomiting, unspecified: Secondary | ICD-10-CM | POA: Diagnosis not present

## 2019-10-14 DIAGNOSIS — Z902 Acquired absence of lung [part of]: Secondary | ICD-10-CM | POA: Diagnosis not present

## 2019-10-14 DIAGNOSIS — C3491 Malignant neoplasm of unspecified part of right bronchus or lung: Secondary | ICD-10-CM

## 2019-10-14 DIAGNOSIS — Z5111 Encounter for antineoplastic chemotherapy: Secondary | ICD-10-CM | POA: Diagnosis not present

## 2019-10-14 DIAGNOSIS — M1990 Unspecified osteoarthritis, unspecified site: Secondary | ICD-10-CM | POA: Diagnosis not present

## 2019-10-14 DIAGNOSIS — J449 Chronic obstructive pulmonary disease, unspecified: Secondary | ICD-10-CM | POA: Diagnosis not present

## 2019-10-14 DIAGNOSIS — C3411 Malignant neoplasm of upper lobe, right bronchus or lung: Secondary | ICD-10-CM | POA: Diagnosis not present

## 2019-10-14 LAB — CBC WITH DIFFERENTIAL (CANCER CENTER ONLY)
Abs Immature Granulocytes: 0.06 10*3/uL (ref 0.00–0.07)
Basophils Absolute: 0 10*3/uL (ref 0.0–0.1)
Basophils Relative: 0 %
Eosinophils Absolute: 0 10*3/uL (ref 0.0–0.5)
Eosinophils Relative: 0 %
HCT: 38.6 % — ABNORMAL LOW (ref 39.0–52.0)
Hemoglobin: 12.9 g/dL — ABNORMAL LOW (ref 13.0–17.0)
Immature Granulocytes: 2 %
Lymphocytes Relative: 20 %
Lymphs Abs: 0.7 10*3/uL (ref 0.7–4.0)
MCH: 29.2 pg (ref 26.0–34.0)
MCHC: 33.4 g/dL (ref 30.0–36.0)
MCV: 87.3 fL (ref 80.0–100.0)
Monocytes Absolute: 0.3 10*3/uL (ref 0.1–1.0)
Monocytes Relative: 8 %
Neutro Abs: 2.6 10*3/uL (ref 1.7–7.7)
Neutrophils Relative %: 70 %
Platelet Count: 521 10*3/uL — ABNORMAL HIGH (ref 150–400)
RBC: 4.42 MIL/uL (ref 4.22–5.81)
RDW: 13.7 % (ref 11.5–15.5)
WBC Count: 3.7 10*3/uL — ABNORMAL LOW (ref 4.0–10.5)
nRBC: 0 % (ref 0.0–0.2)

## 2019-10-14 LAB — CMP (CANCER CENTER ONLY)
ALT: 74 U/L — ABNORMAL HIGH (ref 0–44)
AST: 19 U/L (ref 15–41)
Albumin: 3.4 g/dL — ABNORMAL LOW (ref 3.5–5.0)
Alkaline Phosphatase: 181 U/L — ABNORMAL HIGH (ref 38–126)
Anion gap: 11 (ref 5–15)
BUN: 16 mg/dL (ref 8–23)
CO2: 26 mmol/L (ref 22–32)
Calcium: 9.4 mg/dL (ref 8.9–10.3)
Chloride: 95 mmol/L — ABNORMAL LOW (ref 98–111)
Creatinine: 1.14 mg/dL (ref 0.61–1.24)
GFR, Est AFR Am: >60 mL/min (ref >60)
GFR, Estimated: >60 mL/min (ref >60)
Glucose, Bld: 228 mg/dL — ABNORMAL HIGH (ref 70–99)
Potassium: 4 mmol/L (ref 3.5–5.1)
Sodium: 132 mmol/L — ABNORMAL LOW (ref 135–145)
Total Bilirubin: 0.3 mg/dL (ref 0.3–1.2)
Total Protein: 7 g/dL (ref 6.5–8.1)

## 2019-10-14 LAB — MAGNESIUM: Magnesium: 1.9 mg/dL (ref 1.7–2.4)

## 2019-10-14 MED ORDER — SODIUM CHLORIDE 0.9 % IV SOLN
150.0000 mg | Freq: Once | INTRAVENOUS | Status: AC
  Administered 2019-10-14: 150 mg via INTRAVENOUS
  Filled 2019-10-14: qty 150

## 2019-10-14 MED ORDER — SODIUM CHLORIDE 0.9 % IV SOLN
10.0000 mg | Freq: Once | INTRAVENOUS | Status: AC
  Administered 2019-10-14: 10 mg via INTRAVENOUS
  Filled 2019-10-14: qty 10

## 2019-10-14 MED ORDER — CYANOCOBALAMIN 1000 MCG/ML IJ SOLN
1000.0000 ug | Freq: Once | INTRAMUSCULAR | Status: AC
  Administered 2019-10-14: 1000 ug via INTRAMUSCULAR

## 2019-10-14 MED ORDER — PALONOSETRON HCL INJECTION 0.25 MG/5ML
INTRAVENOUS | Status: AC
  Filled 2019-10-14: qty 5

## 2019-10-14 MED ORDER — PALONOSETRON HCL INJECTION 0.25 MG/5ML
0.2500 mg | Freq: Once | INTRAVENOUS | Status: AC
  Administered 2019-10-14: 0.25 mg via INTRAVENOUS

## 2019-10-14 MED ORDER — SODIUM CHLORIDE 0.9 % IV SOLN
500.0000 mg/m2 | Freq: Once | INTRAVENOUS | Status: AC
  Administered 2019-10-14: 800 mg via INTRAVENOUS
  Filled 2019-10-14: qty 12

## 2019-10-14 MED ORDER — SODIUM CHLORIDE 0.9 % IV SOLN
Freq: Once | INTRAVENOUS | Status: AC
  Filled 2019-10-14: qty 250

## 2019-10-14 MED ORDER — SODIUM CHLORIDE 0.9 % IV SOLN
75.0000 mg/m2 | Freq: Once | INTRAVENOUS | Status: AC
  Administered 2019-10-14: 125 mg via INTRAVENOUS
  Filled 2019-10-14: qty 125

## 2019-10-14 MED ORDER — SODIUM CHLORIDE 0.9 % IV SOLN
Freq: Once | INTRAVENOUS | Status: AC
  Filled 2019-10-14: qty 10

## 2019-10-14 NOTE — Progress Notes (Signed)
[name] [phone]   [phone]  OFFICE PROGRESS NOTE  [name], MD [address] [place] [address]  DIAGNOSIS: Stage IIB (T1b, N1, M0) non-small cell lung cancer, invasive poorly differentiated carcinoma diagnosed in June 2021. Molecular studies are negative for EGFR mutation as well as ALK gene translocation.  PD-L1 expression was 50-100%.  This is based on the report from the [facility] trial  PRIOR THERAPY: Status post right upper lobectomy with lymph node dissection on June 17, 2019 under the care of Dr. Roxan Hockey.  CURRENT THERAPY: Adjuvant systemic chemotherapy with cisplatin 75 mg/M2 and Alimta 500 mg/M2 every 3 weeks.  First dose September 02, 2019. Status post 2 cycles.  INTERVAL HISTORY: Luis [name] 69 y.o. male returns to the clinic today for follow-up visit.  The patient is feeling fine today with no concerning complaints except for occasional nausea.  He denied having any chest pain, shortness of breath, cough or hemoptysis.  He denied having any fever or chills.  He has no vomiting, abdominal pain, diarrhea or constipation.  He has no significant weight loss or night sweats.  He has no headache or visual changes.  The patient is here today for evaluation before starting cycle #3.   MEDICAL HISTORY: Past Medical History:  Diagnosis Date  . Arthritis    through out body  . Cancer ([place])   . Chronic bronchitis ([place])   . Concussion   . COPD (chronic obstructive pulmonary disease) ([place])   . Dyspnea   . Emphysema lung ([place])   . Frozen shoulder    LEFT  . GERD (gastroesophageal reflux disease)   . Hard of hearing   . Headache    alot of days, related to spine issues  . Hypertension   . Lumbar radiculopathy 2013  . Peyronie's disease   . Prostatitis 2011   Dr. Gaynelle Arabian  . RLS (restless legs syndrome)   . Shingles 06/13/2018   shingles on back, finished with prednisone, stills has scabs and pain    . Thigh pain    Bilateral Thigh  . Thigh pain 10/13   bilateral  . Tobacco abuse 2010   still uses electronic cig/ emphysema, SOB easily  . Wears partial dentures    upper    ALLERGIES:  is allergic to doxycycline and tramadol.  MEDICATIONS:  Current Outpatient Medications  Medication Sig Dispense Refill  . acetaminophen (TYLENOL) 650 MG CR tablet Take 650 mg by mouth 2 (two) times daily as needed for pain.    . Cholecalciferol (VITAMIN D) 125 MCG (5000 UT) CAPS Take 5,000 Units by mouth daily.    . citalopram (CELEXA) 20 MG tablet Take 20 mg by mouth daily.    [name] dexamethasone (DECADRON) 4 MG tablet 4 mg p.o. twice daily the day before, day of and day after chemotherapy every 3 weeks. 40 tablet 0  . docusate sodium (COLACE) 100 MG capsule Take 100-200 mg by mouth 2 (two) times daily as needed for mild constipation.    . fluconazole (DIFLUCAN) 100 MG tablet Take 1 tablet (100 mg total) by mouth daily. 7 tablet 0  . folic acid (FOLVITE) 1 MG tablet Take 1 tablet (1 mg total) by mouth daily. 30 tablet 4  . guaiFENesin (MUCINEX) 600 MG 12 hr tablet Take 1 tablet (600 mg total) by mouth 2 (two) times daily as needed for cough or to loosen phlegm.    . hydrocortisone cream 1 %  Apply 1 application topically daily. Skin dermatitis    . OLANZapine (ZYPREXA) 5 MG tablet Take 1 tablet (5 mg total) by mouth at bedtime. 30 tablet 1  . ondansetron (ZOFRAN) 8 MG tablet Take 1 tablet (8 mg total) by mouth every 8 (eight) hours as needed for nausea or vomiting. 20 tablet 0  . pantoprazole (PROTONIX) 20 MG tablet Take 20 mg by mouth every evening.    . promethazine (PHENERGAN) 25 MG suppository Place 25 mg rectally every 8 (eight) hours as needed for nausea or vomiting.    . Tiotropium Bromide-Olodaterol (STIOLTO RESPIMAT) 2.5-2.5 MCG/ACT AERS Inhale 2 puffs into the lungs daily. 4 g 11  . valsartan-hydrochlorothiazide (DIOVAN-HCT) 80-12.5 MG tablet Take 1 tablet by mouth daily.    . vitamin B-12  (CYANOCOBALAMIN) 1000 MCG tablet Take 2,000 mcg by mouth daily.     No current facility-administered medications for this visit.    SURGICAL HISTORY:  Past Surgical History:  Procedure Laterality Date  . CATARACT EXTRACTION W/PHACO Right 07/08/2018   Procedure: CATARACT EXTRACTION PHACO AND INTRAOCULAR LENS PLACEMENT ([place]) RIGHT;  Surgeon: Leandrew Koyanagi, MD;  Location: [facility];  Service: Ophthalmology;  Laterality: Right;  . CATARACT EXTRACTION W/PHACO Left 07/29/2018   Procedure: CATARACT EXTRACTION PHACO AND INTRAOCULAR LENS PLACEMENT ([place]) LEFT TORIC LENS;  Surgeon: Leandrew Koyanagi, MD;  Location: [facility];  Service: Ophthalmology;  Laterality: Left;  . CHEST TUBE INSERTION Right 07/01/2019   Procedure: CHEST TUBE INSERTION;  Surgeon: [name], MD;  Location: [facility];  Service: Thoracic;  Laterality: Right;  . COLONOSCOPY    . EYE SURGERY    . INTERCOSTAL NERVE BLOCK Right 06/17/2019   Procedure: INTERCOSTAL NERVE BLOCK;  Surgeon: [name], MD;  Location: [facility];  Service: Thoracic;  Laterality: Right;  . INTERCOSTAL NERVE BLOCK Right 07/01/2019   Procedure: INTERCOSTAL NERVE BLOCK;  Surgeon: [name], MD;  Location: [facility];  Service: Thoracic;  Laterality: Right;  . TONSILLECTOMY    . VIDEO ASSISTED THORACOSCOPY Right 07/01/2019   Procedure: VIDEO ASSISTED THORACOSCOPY - REMOVAL OF RIGHT MIDDLE LOBE BLEBS;  Surgeon: [name], MD;  Location: [facility];  Service: Thoracic;  Laterality: Right;  [name] VIDEO BRONCHOSCOPY WITH INSERTION OF INTERBRONCHIAL VALVE (IBV) Right 06/25/2019   Procedure: VIDEO BRONCHOSCOPY WITH INSERTION OF INTERBRONCHIAL VALVE (IBV); Size 7 IBV into Right Loer Lobe (RLL) Superior Segment, Size 9 IBV into RLL Basilar Segment;  Surgeon: [name], MD;  Location: [facility];  Service: Thoracic;  Laterality: Right;  [name] VIDEO BRONCHOSCOPY WITH INSERTION OF INTERBRONCHIAL VALVE (IBV) N/A 08/06/2019    Procedure: VIDEO BRONCHOSCOPY WITH REMOVAL OF INTERBRONCHIAL VALVE (IBV);  Surgeon: [name], MD;  [facility];  Service: Thoracic;  Laterality: N/A;    REVIEW OF SYSTEMS:  A comprehensive review of systems was negative except for: Constitutional: positive for fatigue Gastrointestinal: positive for nausea   PHYSICAL EXAMINATION: General appearance: alert, cooperative, fatigued and no distress Head: Normocephalic, without obvious abnormality, atraumatic Neck: no adenopathy, no JVD, supple, symmetrical, trachea midline and thyroid not enlarged, symmetric, no tenderness/mass/nodules Lymph nodes: Cervical, supraclavicular, and axillary nodes normal. Resp: clear to auscultation bilaterally Back: symmetric, no curvature. ROM normal. No CVA tenderness. Cardio: regular rate and rhythm, S1, S2 normal, no murmur, click, rub or gallop GI: soft, non-tender; bowel sounds normal; no masses,  no organomegaly Extremities: extremities normal, atraumatic, no cyanosis or edema  ECOG PERFORMANCE STATUS: 1 - Symptomatic but completely ambulatory  Blood pressure 131/72, pulse 86, temperature (!) 97 F (36.1 C), temperature source Tympanic, resp. rate 18, height 5' 6"  (1.676 m), weight 135 lb 14.4 oz (61.6 kg), SpO2 98 %.  LABORATORY DATA: Lab Results  Component Value Date   WBC 3.7 (L) 10/14/2019   HGB 12.9 (L) 10/14/2019   HCT 38.6 (L) 10/14/2019   MCV 87.3 10/14/2019   PLT 521 (H) 10/14/2019      Chemistry      Component Value Date/Time   NA 132 (L) 10/14/2019 0838   K 4.0 10/14/2019 0838   CL 95 (L) 10/14/2019 0838   CO2 26 10/14/2019 0838   BUN 16 10/14/2019 0838   CREATININE 1.14 10/14/2019 0838      Component Value Date/Time   CALCIUM 9.4 10/14/2019 0838   ALKPHOS 181 (H) 10/14/2019 0838   AST 19 10/14/2019 0838   ALT 74 (H) 10/14/2019 0838   BILITOT 0.3 10/14/2019 0838       RADIOGRAPHIC STUDIES: No results found.  ASSESSMENT AND PLAN: This is a very pleasant  69 years old white male recently diagnosed with stage IIb (T1b, N1, M0) non-small cell lung cancer, adenocarcinoma status post right upper lobectomy with lymph node dissection under the care of Dr. Roxan Hockey on June 17, 2019. The patient had molecular studies on the [facility] clinical trial that showed negative for EGFR mutation as well as ALK gene translocation but PD-L1 expression in the range of 50-100%. The patient is currently undergoing standard adjuvant systemic chemotherapy with cisplatin 75 mg/M2 and Alimta 500 mg/M2 every 3 weeks status post 2 cycles. The patient continues to tolerate his treatment well with no concerning adverse effects. I recommended for him to proceed with cycle #3 today as planned. He will come back for follow-up visit in 3 weeks for evaluation before the last cycle of his treatment. He was advised to call immediately if he has any concerning symptoms in the interval. The patient voices understanding of current disease status and treatment options and is in agreement with the current care plan.  All questions were answered. The patient knows to call the clinic with any problems, questions or concerns. We can certainly see the patient much sooner if necessary.  Disclaimer: This note was dictated with voice recognition software. Similar sounding words can inadvertently be transcribed and may not be corrected upon review.

## 2019-10-14 NOTE — Patient Instructions (Signed)
[facility] Discharge Instructions for Patients Receiving Chemotherapy  Today you received the following chemotherapy agents: Cisplatin and Alimta  To help prevent nausea and vomiting after your treatment, we encourage you to take your nausea medication  as prescribed.    If you develop nausea and vomiting that is not controlled by your nausea medication, call the clinic.   BELOW ARE SYMPTOMS THAT SHOULD BE REPORTED IMMEDIATELY:  *FEVER GREATER THAN 100.5 F  *CHILLS WITH OR WITHOUT FEVER  NAUSEA AND VOMITING THAT IS NOT CONTROLLED WITH YOUR NAUSEA MEDICATION  *UNUSUAL SHORTNESS OF BREATH  *UNUSUAL BRUISING OR BLEEDING  TENDERNESS IN MOUTH AND THROAT WITH OR WITHOUT PRESENCE OF ULCERS  *URINARY PROBLEMS  *BOWEL PROBLEMS  UNUSUAL RASH Items with * indicate a potential emergency and should be followed up as soon as possible.  Feel free to call the clinic should you have any questions or concerns. The clinic phone number is [phone].  Please show the [facility] at check-in to the Emergency Department and triage nurse.

## 2019-10-15 ENCOUNTER — Telehealth: Payer: Self-pay

## 2019-10-15 ENCOUNTER — Other Ambulatory Visit: Payer: Self-pay

## 2019-10-15 DIAGNOSIS — R112 Nausea with vomiting, unspecified: Secondary | ICD-10-CM

## 2019-10-15 MED ORDER — OLANZAPINE 5 MG PO TABS: 5.0000 mg | ORAL_TABLET | Freq: Every day | ORAL | 0 refills | Status: DC

## 2019-10-15 NOTE — Patient Outreach (Signed)
[facility]) Care Management  10/15/2019  Luis [name] 11/30/1950 [id number]   Telephone assessment:   4 week follow up outreach attempt unsuccessful. Left a message requesting a call back.  PLAN: Will call back in 3 business days for follow up if no response.  Tomasa Rand, RN, BSN, CEN [facility] [employer] [phone]

## 2019-10-15 NOTE — Telephone Encounter (Signed)
Pt requesting refill of Olanzapine.

## 2019-10-19 ENCOUNTER — Telehealth: Payer: Self-pay | Admitting: Medical Oncology

## 2019-10-19 NOTE — Telephone Encounter (Signed)
Lvm that scheduler will call to bring in pt tomorrow for IVF.

## 2019-10-19 NOTE — Telephone Encounter (Signed)
"  Dizziness/lightheaded and weak"-Also experiencing dry heaves. "BP at home "140/91". He is eating and drinking 160 cc of gatorade everyday. He sat up today and went outside on deck and tolerated it well. Denies falls.   She is giving him Bonine ( OTC Meclizine)  He refused to fill olazepine.

## 2019-10-20 ENCOUNTER — Other Ambulatory Visit: Payer: Self-pay | Admitting: Physician Assistant

## 2019-10-20 ENCOUNTER — Other Ambulatory Visit: Payer: Self-pay

## 2019-10-20 ENCOUNTER — Ambulatory Visit (HOSPITAL_BASED_OUTPATIENT_CLINIC_OR_DEPARTMENT_OTHER): Payer: Medicare Other | Admitting: Medical

## 2019-10-20 ENCOUNTER — Inpatient Hospital Stay: Payer: Medicare Other | Attending: Internal Medicine

## 2019-10-20 DIAGNOSIS — G2581 Restless legs syndrome: Secondary | ICD-10-CM | POA: Diagnosis not present

## 2019-10-20 DIAGNOSIS — J449 Chronic obstructive pulmonary disease, unspecified: Secondary | ICD-10-CM | POA: Insufficient documentation

## 2019-10-20 DIAGNOSIS — Z79899 Other long term (current) drug therapy: Secondary | ICD-10-CM | POA: Insufficient documentation

## 2019-10-20 DIAGNOSIS — K219 Gastro-esophageal reflux disease without esophagitis: Secondary | ICD-10-CM | POA: Insufficient documentation

## 2019-10-20 DIAGNOSIS — G893 Neoplasm related pain (acute) (chronic): Secondary | ICD-10-CM | POA: Diagnosis not present

## 2019-10-20 DIAGNOSIS — R112 Nausea with vomiting, unspecified: Secondary | ICD-10-CM | POA: Insufficient documentation

## 2019-10-20 DIAGNOSIS — C3411 Malignant neoplasm of upper lobe, right bronchus or lung: Secondary | ICD-10-CM | POA: Insufficient documentation

## 2019-10-20 DIAGNOSIS — E860 Dehydration: Secondary | ICD-10-CM

## 2019-10-20 DIAGNOSIS — R630 Anorexia: Secondary | ICD-10-CM | POA: Insufficient documentation

## 2019-10-20 DIAGNOSIS — I10 Essential (primary) hypertension: Secondary | ICD-10-CM | POA: Diagnosis not present

## 2019-10-20 DIAGNOSIS — M1990 Unspecified osteoarthritis, unspecified site: Secondary | ICD-10-CM | POA: Insufficient documentation

## 2019-10-20 DIAGNOSIS — K5900 Constipation, unspecified: Secondary | ICD-10-CM | POA: Insufficient documentation

## 2019-10-20 DIAGNOSIS — R509 Fever, unspecified: Secondary | ICD-10-CM

## 2019-10-20 DIAGNOSIS — C3491 Malignant neoplasm of unspecified part of right bronchus or lung: Secondary | ICD-10-CM

## 2019-10-20 MED ORDER — ACETAMINOPHEN 325 MG PO TABS
650.0000 mg | ORAL_TABLET | Freq: Once | ORAL | Status: AC
  Administered 2019-10-20: 650 mg via ORAL

## 2019-10-20 MED ORDER — ONDANSETRON HCL 4 MG/2ML IJ SOLN: 8.0000 mg | Freq: Once | INTRAMUSCULAR | Status: DC

## 2019-10-20 MED ORDER — LORAZEPAM 1 MG PO TABS
ORAL_TABLET | ORAL | Status: AC
  Filled 2019-10-20: qty 1

## 2019-10-20 MED ORDER — LORAZEPAM 1 MG PO TABS: 0.5000 mg | ORAL_TABLET | Freq: Once | ORAL | Status: DC

## 2019-10-20 MED ORDER — LORAZEPAM 1 MG PO TABS
0.5000 mg | ORAL_TABLET | Freq: Once | ORAL | Status: AC
  Administered 2019-10-20: 0.5 mg via ORAL

## 2019-10-20 MED ORDER — LORAZEPAM 0.5 MG PO TABS: 0.5000 mg | ORAL_TABLET | Freq: Three times a day (TID) | ORAL | 0 refills | Status: DC

## 2019-10-20 MED ORDER — ACETAMINOPHEN 325 MG PO TABS: 650.0000 mg | ORAL_TABLET | Freq: Once | ORAL | Status: DC

## 2019-10-20 MED ORDER — SODIUM CHLORIDE 0.9 % IV SOLN
Freq: Once | INTRAVENOUS | Status: AC
  Filled 2019-10-20: qty 250

## 2019-10-20 MED ORDER — ACETAMINOPHEN 325 MG PO TABS
ORAL_TABLET | ORAL | Status: AC
  Filled 2019-10-20: qty 2

## 2019-10-20 NOTE — Patient Instructions (Signed)

## 2019-10-20 NOTE — Progress Notes (Signed)
Contacted Sandi Mealy PA to assess patient. Patient c/o nausea unrelieved with home medications, pain of the right side of abdomen/chest, and febrile.

## 2019-10-20 NOTE — Progress Notes (Signed)
Mr. Vanrossum was seen in the infusion room today as he was receiving IV fluids.  This provider was called to see him after it was noted that he had nausea and vomiting despite his use of Phenergan suppositories and Zofran at home.  Additionally it was noted that he had a temperature of 100.2.  The patient is scheduled to have labs tomorrow.  He reports having nausea and vomiting after having his third cycle of cisplatin and Alimta.  He also reports having some constipation and anorexia.  He also has GERD.  He was told to increase his Protonix to 20 mg twice daily.  Additionally he was given Ativan 0.5 mg p.o. x1 and Tylenol today.  In addition to this he was given a prescription for Ativan 0.5 mg to use for his nausea.  Sandi Mealy, MHS, PA-C Physician Assistant

## 2019-10-21 ENCOUNTER — Inpatient Hospital Stay: Payer: Medicare Other

## 2019-10-21 NOTE — Patient Outreach (Signed)
[facility]) Care Management  10/21/2019  Luis [name] [birth year] [id number]    Case Closure: Late Entry from call on 10/20/2019  Placed call to patient and spoke with Luis [name], wife. Ms. Skora reports that patient is doing about the same. Reports feels weak and has nausea after chemo.  Reports patient to go get IVF for dizziness and weakness on 10/20/2019. Wife reports patient has symptoms but does not discuss with MD. Reports she is frustrated with his lack of communication. Reports " he thinks I know what is going on.. I can not read his mind"  Agreed with wife and offered to listen. She was thankful to have a listening ear.  Wife reviewed that the cancer center is connecting then with all needed resource. Reviewed the symptom clinic with wife and encourage her to inquire for future needs.  Wife denies any additional needs at this time but very appreciative of recent support.  PLAN: will close case as all needs met. Will send case closure letter. Encouraged wife to call back for additional needs. She agreed.  Luis Rand, RN, BSN, CEN [facility] [employer] [phone]

## 2019-10-22 ENCOUNTER — Encounter: Payer: Self-pay | Admitting: Medical

## 2019-10-25 ENCOUNTER — Telehealth: Payer: Self-pay | Admitting: Emergency Medicine

## 2019-10-25 NOTE — Telephone Encounter (Signed)
Called pt to attempt to f/u on missed appts last week, per telephone note pt wasn't feeling well.  No answer, left VM requesting f/u phone call.

## 2019-10-28 ENCOUNTER — Inpatient Hospital Stay: Payer: Medicare Other

## 2019-10-28 ENCOUNTER — Other Ambulatory Visit: Payer: Self-pay

## 2019-10-28 DIAGNOSIS — G893 Neoplasm related pain (acute) (chronic): Secondary | ICD-10-CM | POA: Diagnosis not present

## 2019-10-28 DIAGNOSIS — K5900 Constipation, unspecified: Secondary | ICD-10-CM | POA: Diagnosis not present

## 2019-10-28 DIAGNOSIS — R630 Anorexia: Secondary | ICD-10-CM | POA: Diagnosis not present

## 2019-10-28 DIAGNOSIS — C3411 Malignant neoplasm of upper lobe, right bronchus or lung: Secondary | ICD-10-CM | POA: Diagnosis not present

## 2019-10-28 DIAGNOSIS — R112 Nausea with vomiting, unspecified: Secondary | ICD-10-CM | POA: Diagnosis not present

## 2019-10-28 DIAGNOSIS — C3491 Malignant neoplasm of unspecified part of right bronchus or lung: Secondary | ICD-10-CM

## 2019-10-28 DIAGNOSIS — K219 Gastro-esophageal reflux disease without esophagitis: Secondary | ICD-10-CM | POA: Diagnosis not present

## 2019-10-28 LAB — CBC WITH DIFFERENTIAL (CANCER CENTER ONLY)
Abs Immature Granulocytes: 0.02 10*3/uL (ref 0.00–0.07)
Basophils Absolute: 0 10*3/uL (ref 0.0–0.1)
Basophils Relative: 0 %
Eosinophils Absolute: 0.2 10*3/uL (ref 0.0–0.5)
Eosinophils Relative: 4 %
HCT: 33.1 % — ABNORMAL LOW (ref 39.0–52.0)
Hemoglobin: 11.4 g/dL — ABNORMAL LOW (ref 13.0–17.0)
Immature Granulocytes: 1 %
Lymphocytes Relative: 28 %
Lymphs Abs: 1 10*3/uL (ref 0.7–4.0)
MCH: 29.2 pg (ref 26.0–34.0)
MCHC: 34.4 g/dL (ref 30.0–36.0)
MCV: 84.9 fL (ref 80.0–100.0)
Monocytes Absolute: 0.4 10*3/uL (ref 0.1–1.0)
Monocytes Relative: 10 %
Neutro Abs: 2.1 10*3/uL (ref 1.7–7.7)
Neutrophils Relative %: 57 %
Platelet Count: 150 10*3/uL (ref 150–400)
RBC: 3.9 MIL/uL — ABNORMAL LOW (ref 4.22–5.81)
RDW: 14.1 % (ref 11.5–15.5)
WBC Count: 3.6 10*3/uL — ABNORMAL LOW (ref 4.0–10.5)
nRBC: 0 % (ref 0.0–0.2)

## 2019-10-28 LAB — CMP (CANCER CENTER ONLY)
ALT: <6 U/L (ref 0–44)
AST: 7 U/L — ABNORMAL LOW (ref 15–41)
Albumin: 3.1 g/dL — ABNORMAL LOW (ref 3.5–5.0)
Alkaline Phosphatase: 84 U/L (ref 38–126)
Anion gap: 5 (ref 5–15)
BUN: 12 mg/dL (ref 8–23)
CO2: 33 mmol/L — ABNORMAL HIGH (ref 22–32)
Calcium: 9.5 mg/dL (ref 8.9–10.3)
Chloride: 97 mmol/L — ABNORMAL LOW (ref 98–111)
Creatinine: 1.08 mg/dL (ref 0.61–1.24)
GFR, Estimated: >60 mL/min (ref >60)
Glucose, Bld: 137 mg/dL — ABNORMAL HIGH (ref 70–99)
Potassium: 4.9 mmol/L (ref 3.5–5.1)
Sodium: 135 mmol/L (ref 135–145)
Total Bilirubin: 0.4 mg/dL (ref 0.3–1.2)
Total Protein: 6.4 g/dL — ABNORMAL LOW (ref 6.5–8.1)

## 2019-10-28 LAB — MAGNESIUM: Magnesium: 1.7 mg/dL (ref 1.7–2.4)

## 2019-11-01 NOTE — Progress Notes (Signed)
[facility] OFFICE PROGRESS NOTE  [name], MD [place] [address] [place] [address]  DIAGNOSIS: Stage IIB(T1b, N1, M0) non-small cell lung cancer, invasive poorly differentiated carcinoma diagnosed in June 2021. Molecular studies are negative for EGFR mutation as well as ALK gene translocation. PD-L1 expression was 50-100%. This isbased on the report from [facility] trial  PRIOR THERAPY: Status post right upper lobectomy with lymph node dissection on June 17, 2019 under the care of Dr. Roxan Hockey.  CURRENT THERAPY: Adjuvant systemic chemotherapy with cisplatin 75 mg/M2 and Alimta 500 mg/M2 every 3 weeks. First dose September 02, 2019.Status post 3 cycles.   INTERVAL HISTORY: Luis [name] 69 y.o. male returns to the clinic today for a follow-up visit.  The patient is feeling fatigued and experiences nausea/dry heaving following chemotherapy.   The patient states that he has not had a decent day since completing cycle #3 of his treatment.  He is nauseous "all the time". He has a prescription for Zofran and he has been using Phenergan suppositories.  He was given a prescription for Zyprexa but has not been taking it at this point in time.  He is coming in the symptom management clinic in the interval for IV fluids.  He was also given Ativan but this caused hallucinations and he stopped taking it.  He also reports GERD and was instructed to take 20 mg twice daily.  He endorses anorexia. His weight has been stable.  The patient states that he is constipated frequently and take stool softeners daily.  Otherwise, the patient denies any fever or chills.  Patient states that he has night sweats approximately 1-2 times per week.  The patient states that he has intermittent chest pain all the time but believes this is secondary to indigestion.  He has a cough on and off for which he uses an inhaler daily.  He reports his baseline dyspnea on exertion.  He denies any  diarrhea or constipation.  He denies any headache or visual changes but he states that his eyes often feel like they are burning.  When this occurs, the patient states that he washes his eyelids which relieves his symptoms.  The patient is here today for evaluation before proceeding with his last cycle of adjuvant systemic chemotherapy, cycle #4.   MEDICAL HISTORY: Past Medical History:  Diagnosis Date  . Arthritis    through out body  . Cancer ([place])   . Chronic bronchitis ([place])   . Concussion   . COPD (chronic obstructive pulmonary disease) ([place])   . Dyspnea   . Emphysema lung ([place])   . Frozen shoulder    LEFT  . GERD (gastroesophageal reflux disease)   . Hard of hearing   . Headache    alot of days, related to spine issues  . Hypertension   . Lumbar radiculopathy 2013  . Peyronie's disease   . Prostatitis 2011   Dr. Gaynelle Arabian  . RLS (restless legs syndrome)   . Shingles 06/13/2018   shingles on back, finished with prednisone, stills has scabs and pain  . Thigh pain    Bilateral Thigh  . Thigh pain 10/13   bilateral  . Tobacco abuse 2010   still uses electronic cig/ emphysema, SOB easily  . Wears partial dentures    upper    ALLERGIES:  is allergic to doxycycline and tramadol.  MEDICATIONS:  Current Outpatient Medications  Medication Sig Dispense Refill  . acetaminophen (TYLENOL) 650 MG CR tablet Take  650 mg by mouth 2 (two) times daily as needed for pain.    . Cholecalciferol (VITAMIN D) 125 MCG (5000 UT) CAPS Take 5,000 Units by mouth daily.    . citalopram (CELEXA) 20 MG tablet Take 20 mg by mouth daily.    [name] dexamethasone (DECADRON) 4 MG tablet 4 mg p.o. twice daily the day before, day of and day after chemotherapy every 3 weeks. 40 tablet 0  . docusate sodium (COLACE) 100 MG capsule Take 100-200 mg by mouth 2 (two) times daily as needed for mild constipation.    . fluconazole (DIFLUCAN) 100 MG tablet Take 1 tablet (100 mg total) by mouth daily. 7 tablet 0  .  folic acid (FOLVITE) 1 MG tablet Take 1 tablet (1 mg total) by mouth daily. 30 tablet 4  . guaiFENesin (MUCINEX) 600 MG 12 hr tablet Take 1 tablet (600 mg total) by mouth 2 (two) times daily as needed for cough or to loosen phlegm.    . hydrocortisone cream 1 % Apply 1 application topically daily. Skin dermatitis    . LORazepam (ATIVAN) 0.5 MG tablet Place 1 tablet (0.5 mg total) under the tongue every 8 (eight) hours. 30 tablet 0  . Meclizine HCl 25 MG CHEW Chew 25 mg by mouth as directed. "Take one hour prior to travel for Motion sickness"    . ondansetron (ZOFRAN) 8 MG tablet Take 1 tablet (8 mg total) by mouth every 8 (eight) hours as needed for nausea or vomiting. 20 tablet 0  . pantoprazole (PROTONIX) 20 MG tablet Take 20 mg by mouth every evening.    . promethazine (PHENERGAN) 25 MG suppository Place 25 mg rectally every 8 (eight) hours as needed for nausea or vomiting.    . Tiotropium Bromide-Olodaterol (STIOLTO RESPIMAT) 2.5-2.5 MCG/ACT AERS Inhale 2 puffs into the lungs daily. 4 g 11  . valsartan-hydrochlorothiazide (DIOVAN-HCT) 80-12.5 MG tablet Take 1 tablet by mouth daily.    . vitamin B-12 (CYANOCOBALAMIN) 1000 MCG tablet Take 2,000 mcg by mouth daily.     No current facility-administered medications for this visit.    SURGICAL HISTORY:  Past Surgical History:  Procedure Laterality Date  . CATARACT EXTRACTION W/PHACO Right 07/08/2018   Procedure: CATARACT EXTRACTION PHACO AND INTRAOCULAR LENS PLACEMENT ([facility]) RIGHT;  Surgeon: Leandrew Koyanagi, MD;  Location: [facility];  Service: Ophthalmology;  Laterality: Right;  . CATARACT EXTRACTION W/PHACO Left 07/29/2018   Procedure: CATARACT EXTRACTION PHACO AND INTRAOCULAR LENS PLACEMENT ([place]) LEFT TORIC LENS;  Surgeon: Leandrew Koyanagi, MD;  Location: [facility];  Service: Ophthalmology;  Laterality: Left;  . CHEST TUBE INSERTION Right 07/01/2019   Procedure: CHEST TUBE INSERTION;  Surgeon: [name],  MD;  Location: [facility];  Service: Thoracic;  Laterality: Right;  . COLONOSCOPY    . EYE SURGERY    . INTERCOSTAL NERVE BLOCK Right 06/17/2019   Procedure: INTERCOSTAL NERVE BLOCK;  Surgeon: [name], MD;  Location: [facility];  Service: Thoracic;  Laterality: Right;  . INTERCOSTAL NERVE BLOCK Right 07/01/2019   Procedure: INTERCOSTAL NERVE BLOCK;  Surgeon: [name], MD;  Location: [facility];  Service: Thoracic;  Laterality: Right;  . TONSILLECTOMY    . VIDEO ASSISTED THORACOSCOPY Right 07/01/2019   Procedure: VIDEO ASSISTED THORACOSCOPY - REMOVAL OF RIGHT MIDDLE LOBE BLEBS;  Surgeon: [name], MD;  Location: [facility];  Service: Thoracic;  Laterality: Right;  [name] VIDEO BRONCHOSCOPY WITH INSERTION OF INTERBRONCHIAL VALVE (IBV) Right 06/25/2019   Procedure: VIDEO BRONCHOSCOPY  WITH INSERTION OF INTERBRONCHIAL VALVE (IBV); Size 7 IBV into Right Loer Lobe (RLL) Superior Segment, Size 9 IBV into RLL Basilar Segment;  Surgeon: [name], MD;  Location: [facility];  Service: Thoracic;  Laterality: Right;  [name] VIDEO BRONCHOSCOPY WITH INSERTION OF INTERBRONCHIAL VALVE (IBV) N/A 08/06/2019   Procedure: VIDEO BRONCHOSCOPY WITH REMOVAL OF INTERBRONCHIAL VALVE (IBV);  Surgeon: [name], MD;  Location[facility];  Service: Thoracic;  Laterality: N/A;    REVIEW OF SYSTEMS:   Review of Systems  Constitutional: Positive for fatigue, appetite change, and generalized weakness.  Negative for  chills, fever and unexpected weight change.  HENT:   Negative for mouth sores, nosebleeds, sore throat and trouble swallowing.   Eyes: Positive for "burning" sensation in his eyes  (none at this time ). Negative for eye problems and icterus.  Respiratory: Positive for baseline cough and dyspnea on exertion.  Negative for hemoptysis and wheezing.   Cardiovascular: Negative for chest pain and leg swelling.  Gastrointestinal: Positive for constipation and nausea. Negative for abdominal pain,  diarrhea, and vomiting.  Genitourinary: Negative for bladder incontinence, difficulty urinating, dysuria, frequency and hematuria.   Musculoskeletal: Negative for back pain, gait problem, neck pain and neck stiffness.  Skin: Negative for itching and rash.  Neurological: Negative for dizziness, extremity weakness, gait problem, headaches, light-headedness and seizures.  Hematological: Negative for adenopathy. Does not bruise/bleed easily.  Psychiatric/Behavioral: Negative for confusion, depression and sleep disturbance. The patient is not nervous/anxious.     PHYSICAL EXAMINATION:  Blood pressure (!) 170/91, pulse 75, temperature (!) 97.4 F (36.3 C), temperature source Tympanic, resp. rate 18, height 5' 6"  (1.676 m), weight 138 lb 4.8 oz (62.7 kg), SpO2 100 %.  ECOG PERFORMANCE STATUS: 1 - Symptomatic but completely ambulatory  Physical Exam  Constitutional: Oriented to person, place, and time and well-developed, well-nourished, and in no distress.  HENT:  Head: Normocephalic and atraumatic.  Mouth/Throat: Oropharynx is clear and moist. No oropharyngeal exudate.  Eyes: Conjunctivae are normal. Right eye exhibits no discharge. Left eye exhibits no discharge. No scleral icterus.  Neck: Normal range of motion. Neck supple.  Cardiovascular: Normal rate, regular rhythm, normal heart sounds and intact distal pulses.   Pulmonary/Chest: Effort normal and breath sounds normal. No respiratory distress. No wheezes. No rales.  Abdominal: Soft. Bowel sounds are normal. Exhibits no distension and no mass. There is no tenderness.  Musculoskeletal: Normal range of motion. Exhibits no edema.  Lymphadenopathy:    No cervical adenopathy.  Neurological: Alert and oriented to person, place, and time. Exhibits normal muscle tone. Gait normal. Coordination normal.  Skin: Skin is warm and dry. No rash noted. Not diaphoretic. No erythema. No pallor.  Psychiatric: Mood, memory and judgment normal.  Vitals  reviewed.  LABORATORY DATA: Lab Results  Component Value Date   WBC 3.5 (L) 11/04/2019   HGB 11.9 (L) 11/04/2019   HCT 34.6 (L) 11/04/2019   MCV 85.2 11/04/2019   PLT 426 (H) 11/04/2019      Chemistry      Component Value Date/Time   NA 134 (L) 11/04/2019 0852   K 4.4 11/04/2019 0852   CL 97 (L) 11/04/2019 0852   CO2 21 (L) 11/04/2019 0852   BUN 15 11/04/2019 0852   CREATININE 1.18 11/04/2019 0852      Component Value Date/Time   CALCIUM 9.7 11/04/2019 0852   ALKPHOS 92 11/04/2019 0852   AST 7 (L) 11/04/2019 0852   ALT <6 11/04/2019 2202  BILITOT 0.3 11/04/2019 0852       RADIOGRAPHIC STUDIES:  No results found.   ASSESSMENT/PLAN:  This is a very pleasant 70 year old Caucasian male recently diagnosed with stage IIb (T1b, N1, M0) non-small cell lung cancer, adenocarcinoma. He is status post a right upper lobe lobectomy with lymph node dissection under the care of Dr. Roxan Hockey. This was performed on June 17, 2019. He has no actionable mutations. His PD-L1 expression was estimated to be in the range of 50 to 100% by the molecular studies performed by the [facility] clinical trial.   The patient is currently undergoing systemic adjuvant chemotherapy with cisplatin 75 mg per metered square and Alimta 500 mg/m. He is status post 3 cycles and has fatigue, nausea/vomiting, gerd, and anorexia.   The patient was seen with Dr. Julien Nordmann today.  Labs are reviewed.  Dr. Earlie Server had a lengthy discussion with the patient today about his current condition.  The patient was given the option of discontinuing treatment having completed 3 of the 4 planned cycles of chemotherapy versus proceeding with a last cycle of treatment today.  Dr. Julien Nordmann discussed that it is reasonable to discontinue treatment at this point since the patient is having such a challenging time with treatment.  The patient has decided to discontinue treatment at this time.  I will arrange for restaging CT scan  of the chest in 3-4 weeks to restage his disease.  We will see him back for follow-up visit in 4 weeks for evaluation and to review his scan results.  The patient was advised to call immediately if he has any concerning symptoms in the interval. The patient voices understanding of current disease status and treatment options and is in agreement with the current care plan. All questions were answered. The patient knows to call the clinic with any problems, questions or concerns. We can certainly see the patient much sooner if necessary      Orders Placed This Encounter  Procedures  . CT Chest W Contrast    Standing Status:   Future    Standing Expiration Date:   11/03/2020    Order Specific Question:   If indicated for the ordered procedure, I authorize the administration of contrast media per Radiology protocol    Answer:   Yes    Order Specific Question:   Preferred imaging location?    Answer:   [facility]  . CBC with Differential ([place] Only)    Standing Status:   Future    Standing Expiration Date:   11/03/2020  . CMP ([place] only)    Standing Status:   Future    Standing Expiration Date:   11/03/2020     Tobe Sos Keisha Amer, PA-C 11/04/19  ADDENDUM: Hematology/Oncology Attending: I had a face-to-face encounter with the patient today.  I recommended his care plan.  This is a very pleasant 69 years old white male with a stage IIb non-small cell lung cancer, adenocarcinoma status post right upper lobectomy with lymph node dissection in June 2021.  The patient is currently undergoing adjuvant treatment with systemic chemotherapy with cisplatin and Alimta status post 3 cycles.  He has a rough time with his adjuvant therapy especially with the last cycle with increasing fatigue and weakness as well as lack of appetite and weight loss.  He also has delayed nausea. I had a lengthy discussion with the patient today about his current condition and  treatment options.  The patient was giving the option of continuing  with cycle #4 today and try to manage his symptoms as much as we can versus stopping the treatment at this point and repeat imaging studies in 1 months.  The patient is interested in discontinuing the treatment at this point. We will see him back for follow-up visit in 1 months for evaluation with repeat CT scan of the chest for restaging of his disease. He was advised to call immediately if he has any concerning symptoms in the interval.  Disclaimer: This note was dictated with voice recognition software. Similar sounding words can inadvertently be transcribed and may be missed upon review. Eilleen Kempf, MD 11/06/19

## 2019-11-03 MED FILL — Fosaprepitant Dimeglumine For IV Infusion 150 MG (Base Eq): INTRAVENOUS | Qty: 5 | Status: AC

## 2019-11-03 MED FILL — Dexamethasone Sodium Phosphate Inj 100 MG/10ML: INTRAMUSCULAR | Qty: 1 | Status: AC

## 2019-11-04 ENCOUNTER — Inpatient Hospital Stay: Payer: Medicare Other

## 2019-11-04 ENCOUNTER — Other Ambulatory Visit: Payer: Self-pay

## 2019-11-04 ENCOUNTER — Inpatient Hospital Stay (HOSPITAL_BASED_OUTPATIENT_CLINIC_OR_DEPARTMENT_OTHER): Payer: Medicare Other | Admitting: Physician Assistant

## 2019-11-04 VITALS — BP 170/91 | HR 75 | Temp 97.4°F | Resp 18 | Ht 66.0 in | Wt 138.3 lb

## 2019-11-04 DIAGNOSIS — K5900 Constipation, unspecified: Secondary | ICD-10-CM | POA: Diagnosis not present

## 2019-11-04 DIAGNOSIS — K219 Gastro-esophageal reflux disease without esophagitis: Secondary | ICD-10-CM | POA: Diagnosis not present

## 2019-11-04 DIAGNOSIS — R112 Nausea with vomiting, unspecified: Secondary | ICD-10-CM | POA: Diagnosis not present

## 2019-11-04 DIAGNOSIS — G893 Neoplasm related pain (acute) (chronic): Secondary | ICD-10-CM | POA: Diagnosis not present

## 2019-11-04 DIAGNOSIS — C3411 Malignant neoplasm of upper lobe, right bronchus or lung: Secondary | ICD-10-CM | POA: Diagnosis not present

## 2019-11-04 DIAGNOSIS — R630 Anorexia: Secondary | ICD-10-CM | POA: Diagnosis not present

## 2019-11-04 DIAGNOSIS — C3491 Malignant neoplasm of unspecified part of right bronchus or lung: Secondary | ICD-10-CM

## 2019-11-04 LAB — CMP (CANCER CENTER ONLY)
ALT: <6 U/L (ref 0–44)
AST: 7 U/L — ABNORMAL LOW (ref 15–41)
Albumin: 3.5 g/dL (ref 3.5–5.0)
Alkaline Phosphatase: 92 U/L (ref 38–126)
Anion gap: 16 — ABNORMAL HIGH (ref 5–15)
BUN: 15 mg/dL (ref 8–23)
CO2: 21 mmol/L — ABNORMAL LOW (ref 22–32)
Calcium: 9.7 mg/dL (ref 8.9–10.3)
Chloride: 97 mmol/L — ABNORMAL LOW (ref 98–111)
Creatinine: 1.18 mg/dL (ref 0.61–1.24)
GFR, Estimated: >60 mL/min (ref >60)
Glucose, Bld: 203 mg/dL — ABNORMAL HIGH (ref 70–99)
Potassium: 4.4 mmol/L (ref 3.5–5.1)
Sodium: 134 mmol/L — ABNORMAL LOW (ref 135–145)
Total Bilirubin: 0.3 mg/dL (ref 0.3–1.2)
Total Protein: 7 g/dL (ref 6.5–8.1)

## 2019-11-04 LAB — CBC WITH DIFFERENTIAL (CANCER CENTER ONLY)
Abs Immature Granulocytes: 0.05 10*3/uL (ref 0.00–0.07)
Basophils Absolute: 0 10*3/uL (ref 0.0–0.1)
Basophils Relative: 0 %
Eosinophils Absolute: 0 10*3/uL (ref 0.0–0.5)
Eosinophils Relative: 0 %
HCT: 34.6 % — ABNORMAL LOW (ref 39.0–52.0)
Hemoglobin: 11.9 g/dL — ABNORMAL LOW (ref 13.0–17.0)
Immature Granulocytes: 1 %
Lymphocytes Relative: 16 %
Lymphs Abs: 0.6 10*3/uL — ABNORMAL LOW (ref 0.7–4.0)
MCH: 29.3 pg (ref 26.0–34.0)
MCHC: 34.4 g/dL (ref 30.0–36.0)
MCV: 85.2 fL (ref 80.0–100.0)
Monocytes Absolute: 0.6 10*3/uL (ref 0.1–1.0)
Monocytes Relative: 17 %
Neutro Abs: 2.3 10*3/uL (ref 1.7–7.7)
Neutrophils Relative %: 66 %
Platelet Count: 426 10*3/uL — ABNORMAL HIGH (ref 150–400)
RBC: 4.06 MIL/uL — ABNORMAL LOW (ref 4.22–5.81)
RDW: 15.2 % (ref 11.5–15.5)
WBC Count: 3.5 10*3/uL — ABNORMAL LOW (ref 4.0–10.5)
nRBC: 0 % (ref 0.0–0.2)

## 2019-11-04 LAB — MAGNESIUM: Magnesium: 1.9 mg/dL (ref 1.7–2.4)

## 2019-11-06 ENCOUNTER — Encounter: Payer: Self-pay | Admitting: Physician Assistant

## 2019-11-09 ENCOUNTER — Encounter: Payer: Self-pay | Admitting: Internal Medicine

## 2019-11-09 DIAGNOSIS — J449 Chronic obstructive pulmonary disease, unspecified: Secondary | ICD-10-CM | POA: Diagnosis not present

## 2019-11-09 DIAGNOSIS — J441 Chronic obstructive pulmonary disease with (acute) exacerbation: Secondary | ICD-10-CM | POA: Diagnosis not present

## 2019-11-22 ENCOUNTER — Other Ambulatory Visit: Payer: Self-pay | Admitting: Internal Medicine

## 2019-11-29 ENCOUNTER — Ambulatory Visit
Admission: RE | Admit: 2019-11-29 | Discharge: 2019-11-29 | Disposition: A | Discharge status: Home / Self Care | Payer: Medicare Other | Source: Ambulatory Visit | Attending: Physician Assistant | Admitting: Physician Assistant

## 2019-11-29 ENCOUNTER — Other Ambulatory Visit: Payer: Self-pay

## 2019-11-29 DIAGNOSIS — C3491 Malignant neoplasm of unspecified part of right bronchus or lung: Secondary | ICD-10-CM | POA: Diagnosis not present

## 2019-11-29 DIAGNOSIS — J90 Pleural effusion, not elsewhere classified: Secondary | ICD-10-CM | POA: Diagnosis not present

## 2019-11-29 DIAGNOSIS — J439 Emphysema, unspecified: Secondary | ICD-10-CM | POA: Diagnosis not present

## 2019-11-29 DIAGNOSIS — C3490 Malignant neoplasm of unspecified part of unspecified bronchus or lung: Secondary | ICD-10-CM | POA: Diagnosis not present

## 2019-11-29 DIAGNOSIS — I2510 Atherosclerotic heart disease of native coronary artery without angina pectoris: Secondary | ICD-10-CM | POA: Diagnosis not present

## 2019-11-29 MED ORDER — IOHEXOL 300 MG/ML  SOLN
75.0000 mL | Freq: Once | INTRAMUSCULAR | Status: AC | PRN
  Administered 2019-11-29: 75 mL via INTRAVENOUS

## 2019-12-01 DIAGNOSIS — K219 Gastro-esophageal reflux disease without esophagitis: Secondary | ICD-10-CM | POA: Diagnosis not present

## 2019-12-01 DIAGNOSIS — J449 Chronic obstructive pulmonary disease, unspecified: Secondary | ICD-10-CM | POA: Diagnosis not present

## 2019-12-01 DIAGNOSIS — J441 Chronic obstructive pulmonary disease with (acute) exacerbation: Secondary | ICD-10-CM | POA: Diagnosis not present

## 2019-12-02 ENCOUNTER — Inpatient Hospital Stay: Payer: Medicare Other | Attending: Internal Medicine | Admitting: Internal Medicine

## 2019-12-02 ENCOUNTER — Encounter: Payer: Self-pay | Admitting: Internal Medicine

## 2019-12-02 ENCOUNTER — Other Ambulatory Visit: Payer: Self-pay

## 2019-12-02 DIAGNOSIS — R531 Weakness: Secondary | ICD-10-CM | POA: Diagnosis not present

## 2019-12-02 DIAGNOSIS — R1110 Vomiting, unspecified: Secondary | ICD-10-CM | POA: Diagnosis not present

## 2019-12-02 DIAGNOSIS — K219 Gastro-esophageal reflux disease without esophagitis: Secondary | ICD-10-CM | POA: Insufficient documentation

## 2019-12-02 DIAGNOSIS — Z79899 Other long term (current) drug therapy: Secondary | ICD-10-CM | POA: Diagnosis not present

## 2019-12-02 DIAGNOSIS — R42 Dizziness and giddiness: Secondary | ICD-10-CM | POA: Diagnosis not present

## 2019-12-02 DIAGNOSIS — J449 Chronic obstructive pulmonary disease, unspecified: Secondary | ICD-10-CM | POA: Diagnosis not present

## 2019-12-02 DIAGNOSIS — C3490 Malignant neoplasm of unspecified part of unspecified bronchus or lung: Secondary | ICD-10-CM | POA: Diagnosis not present

## 2019-12-02 DIAGNOSIS — J90 Pleural effusion, not elsewhere classified: Secondary | ICD-10-CM | POA: Diagnosis not present

## 2019-12-02 DIAGNOSIS — M1990 Unspecified osteoarthritis, unspecified site: Secondary | ICD-10-CM | POA: Insufficient documentation

## 2019-12-02 DIAGNOSIS — I10 Essential (primary) hypertension: Secondary | ICD-10-CM | POA: Diagnosis not present

## 2019-12-02 DIAGNOSIS — C3411 Malignant neoplasm of upper lobe, right bronchus or lung: Secondary | ICD-10-CM | POA: Diagnosis not present

## 2019-12-02 DIAGNOSIS — Z902 Acquired absence of lung [part of]: Secondary | ICD-10-CM | POA: Diagnosis not present

## 2019-12-02 DIAGNOSIS — Z9221 Personal history of antineoplastic chemotherapy: Secondary | ICD-10-CM | POA: Diagnosis not present

## 2019-12-02 DIAGNOSIS — J439 Emphysema, unspecified: Secondary | ICD-10-CM | POA: Insufficient documentation

## 2019-12-02 NOTE — Progress Notes (Signed)
[facility] [phone]   [phone]  OFFICE PROGRESS NOTE  [name], MD [address] [place] [address]  DIAGNOSIS: Stage IIB (T1b, N1, M0) non-small cell lung cancer, invasive poorly differentiated carcinoma diagnosed in June 2021. Molecular studies are negative for EGFR mutation as well as ALK gene translocation.  PD-L1 expression was 50-100%.  This is based on the report from the [facility] trial  PRIOR THERAPY:  1) Status post right upper lobectomy with lymph node dissection on June 17, 2019 under the care of Dr. Roxan Hockey. 2) Adjuvant systemic chemotherapy with cisplatin 75 mg/M2 and Alimta 500 mg/M2 every 3 weeks.  First dose September 02, 2019. Status post 3 cycles.  His treatment was discontinued secondary to intolerance.  CURRENT THERAPY: None.  INTERVAL HISTORY: Luis [name] 69 y.o. male returns to the clinic today for follow-up visit.  The patient is feeling fine today except for the generalized fatigue and weakness as well as intermittent vertigo and dizzy spells with occasional vomiting.  He also has right-sided chest pain.  He was unable to drink and eat enough.  He discontinued his treatment after cycle #3 because of the intolerance.  The patient denied having any current shortness of breath, cough or hemoptysis.  He denied having any fever or chills.  He has no bleeding issues.  He had repeat CT scan of the chest performed recently and he is here for evaluation and discussion of his scan results.   MEDICAL HISTORY: Past Medical History:  Diagnosis Date  . Arthritis    through out body  . Cancer ([place])   . Chronic bronchitis ([place])   . Concussion   . COPD (chronic obstructive pulmonary disease) ([place])   . Dyspnea   . Emphysema lung ([place])   . Frozen shoulder    LEFT  . GERD (gastroesophageal reflux disease)   . Hard of hearing   . Headache    alot of days, related to spine issues  . Hypertension   .  Lumbar radiculopathy 2013  . Peyronie's disease   . Prostatitis 2011   Dr. Gaynelle Arabian  . RLS (restless legs syndrome)   . Shingles 06/13/2018   shingles on back, finished with prednisone, stills has scabs and pain  . Thigh pain    Bilateral Thigh  . Thigh pain 10/13   bilateral  . Tobacco abuse 2010   still uses electronic cig/ emphysema, SOB easily  . Wears partial dentures    upper    ALLERGIES:  is allergic to doxycycline and tramadol.  MEDICATIONS:  Current Outpatient Medications  Medication Sig Dispense Refill  . acetaminophen (TYLENOL) 650 MG CR tablet Take 650 mg by mouth 2 (two) times daily as needed for pain.    . Cholecalciferol (VITAMIN D) 125 MCG (5000 UT) CAPS Take 5,000 Units by mouth daily.    . citalopram (CELEXA) 20 MG tablet Take 20 mg by mouth daily.    [name] dexamethasone (DECADRON) 4 MG tablet 4 mg p.o. twice daily the day before, day of and day after chemotherapy every 3 weeks. 40 tablet 0  . docusate sodium (COLACE) 100 MG capsule Take 100-200 mg by mouth 2 (two) times daily as needed for mild constipation.    . fluconazole (DIFLUCAN) 100 MG tablet Take 1 tablet (100 mg total) by mouth daily. 7 tablet 0  . folic acid (FOLVITE) 1 MG tablet TAKE 1 TABLET BY MOUTH EVERY DAY 90 tablet 1  .  guaiFENesin (MUCINEX) 600 MG 12 hr tablet Take 1 tablet (600 mg total) by mouth 2 (two) times daily as needed for cough or to loosen phlegm.    . hydrocortisone cream 1 % Apply 1 application topically daily. Skin dermatitis    . LORazepam (ATIVAN) 0.5 MG tablet Place 1 tablet (0.5 mg total) under the tongue every 8 (eight) hours. 30 tablet 0  . Meclizine HCl 25 MG CHEW Chew 25 mg by mouth as directed. "Take one hour prior to travel for Motion sickness"    . ondansetron (ZOFRAN) 8 MG tablet Take 1 tablet (8 mg total) by mouth every 8 (eight) hours as needed for nausea or vomiting. 20 tablet 0  . pantoprazole (PROTONIX) 20 MG tablet Take 20 mg by mouth every evening.    .  promethazine (PHENERGAN) 25 MG suppository Place 25 mg rectally every 8 (eight) hours as needed for nausea or vomiting.    . Tiotropium Bromide-Olodaterol (STIOLTO RESPIMAT) 2.5-2.5 MCG/ACT AERS Inhale 2 puffs into the lungs daily. 4 g 11  . valsartan-hydrochlorothiazide (DIOVAN-HCT) 80-12.5 MG tablet Take 1 tablet by mouth daily.    . vitamin B-12 (CYANOCOBALAMIN) 1000 MCG tablet Take 2,000 mcg by mouth daily.     No current facility-administered medications for this visit.    SURGICAL HISTORY:  Past Surgical History:  Procedure Laterality Date  . CATARACT EXTRACTION W/PHACO Right 07/08/2018   Procedure: CATARACT EXTRACTION PHACO AND INTRAOCULAR LENS PLACEMENT ([place]) RIGHT;  Surgeon: Leandrew Koyanagi, MD;  Location: [facility];  Service: Ophthalmology;  Laterality: Right;  . CATARACT EXTRACTION W/PHACO Left 07/29/2018   Procedure: CATARACT EXTRACTION PHACO AND INTRAOCULAR LENS PLACEMENT ([place]) LEFT TORIC LENS;  Surgeon: Leandrew Koyanagi, MD;  Location: [facility];  Service: Ophthalmology;  Laterality: Left;  . CHEST TUBE INSERTION Right 07/01/2019   Procedure: CHEST TUBE INSERTION;  Surgeon: [name], MD;  Location: [facility];  Service: Thoracic;  Laterality: Right;  . COLONOSCOPY    . EYE SURGERY    . INTERCOSTAL NERVE BLOCK Right 06/17/2019   Procedure: INTERCOSTAL NERVE BLOCK;  Surgeon: [name], MD;  Location: [facility];  Service: Thoracic;  Laterality: Right;  . INTERCOSTAL NERVE BLOCK Right 07/01/2019   Procedure: INTERCOSTAL NERVE BLOCK;  Surgeon: [name], MD;  Location: [facility];  Service: Thoracic;  Laterality: Right;  . TONSILLECTOMY    . VIDEO ASSISTED THORACOSCOPY Right 07/01/2019   Procedure: VIDEO ASSISTED THORACOSCOPY - REMOVAL OF RIGHT MIDDLE LOBE BLEBS;  Surgeon: [name], MD;  Location: [facility];  Service: Thoracic;  Laterality: Right;  [name] VIDEO BRONCHOSCOPY WITH INSERTION OF INTERBRONCHIAL VALVE (IBV) Right 06/25/2019    Procedure: VIDEO BRONCHOSCOPY WITH INSERTION OF INTERBRONCHIAL VALVE (IBV); Size 7 IBV into Right Loer Lobe (RLL) Superior Segment, Size 9 IBV into RLL Basilar Segment;  Surgeon: [name], MD;  Location: [facility];  Service: Thoracic;  Laterality: Right;  [name] VIDEO BRONCHOSCOPY WITH INSERTION OF INTERBRONCHIAL VALVE (IBV) N/A 08/06/2019   Procedure: VIDEO BRONCHOSCOPY WITH REMOVAL OF INTERBRONCHIAL VALVE (IBV);  Surgeon: [name], MD;  Location: [facility];  Service: Thoracic;  Laterality: N/A;    REVIEW OF SYSTEMS:  Constitutional: positive for fatigue and weight loss Eyes: negative Ears, nose, mouth, throat, and face: negative Respiratory: positive for pleurisy/chest pain Cardiovascular: negative Gastrointestinal: positive for nausea Genitourinary:negative Integument/breast: negative Hematologic/lymphatic: negative Musculoskeletal:negative Neurological: positive for dizziness Behavioral/Psych: negative Endocrine: negative Allergic/Immunologic: negative   PHYSICAL EXAMINATION: General appearance: alert, cooperative, fatigued and no  distress Head: Normocephalic, without obvious abnormality, atraumatic Neck: no adenopathy, no JVD, supple, symmetrical, trachea midline and thyroid not enlarged, symmetric, no tenderness/mass/nodules Lymph nodes: Cervical, supraclavicular, and axillary nodes normal. Resp: clear to auscultation bilaterally Back: symmetric, no curvature. ROM normal. No CVA tenderness. Cardio: regular rate and rhythm, S1, S2 normal, no murmur, click, rub or gallop GI: soft, non-tender; bowel sounds normal; no masses,  no organomegaly Extremities: extremities normal, atraumatic, no cyanosis or edema Neurologic: Alert and oriented X 3, normal strength and tone. Normal symmetric reflexes. Normal coordination and gait  ECOG PERFORMANCE STATUS: 1 - Symptomatic but completely ambulatory  Blood pressure (!) 154/90, pulse (!) 20, temperature 98.5 F (36.9 C),  temperature source Tympanic, resp. rate (!) 108, height 5' 6"  (1.676 m), weight 136 lb 1.6 oz (61.7 kg), SpO2 100 %.  LABORATORY DATA: Lab Results  Component Value Date   WBC 3.5 (L) 11/04/2019   HGB 11.9 (L) 11/04/2019   HCT 34.6 (L) 11/04/2019   MCV 85.2 11/04/2019   PLT 426 (H) 11/04/2019      Chemistry      Component Value Date/Time   NA 134 (L) 11/04/2019 0852   K 4.4 11/04/2019 0852   CL 97 (L) 11/04/2019 0852   CO2 21 (L) 11/04/2019 0852   BUN 15 11/04/2019 0852   CREATININE 1.18 11/04/2019 0852      Component Value Date/Time   CALCIUM 9.7 11/04/2019 0852   ALKPHOS 92 11/04/2019 0852   AST 7 (L) 11/04/2019 0852   ALT <6 11/04/2019 0852   BILITOT 0.3 11/04/2019 0852       RADIOGRAPHIC STUDIES: CT Chest W Contrast  Result Date: 11/29/2019 CLINICAL DATA:  Stage II non-small cell lung cancer. Status post right upper lobectomy and lymph node dissection 06/17/2019. Last chemotherapy 6 weeks ago. Status post vats on 07/01/2019. EXAM: CT CHEST WITH CONTRAST TECHNIQUE: Multidetector CT imaging of the chest was performed during intravenous contrast administration. CONTRAST:  18m OMNIPAQUE IOHEXOL 300 MG/ML  SOLN COMPARISON:  05/17/2019 pet and 04/26/2019 chest ct. FINDINGS: Cardiovascular: Aortic atherosclerosis. Normal heart size, without pericardial effusion. Lad and right coronary artery calcification No central pulmonary embolism, on this non-dedicated study. Mediastinum/Nodes: No supraclavicular adenopathy. No mediastinal or hilar adenopathy. Lungs/Pleura: Small right pleural effusion. Area of hyperattenuation within the right pleural space are suspicious for pleural implants. Example on image 128 and 124 of series 2. A larger soft tissue density within the pleural space measures 1.4 by 1.9 cm on 88/2. No left-sided pleural effusion. Moderate to marked bullous type emphysema.  Right upper lobectomy. Left posterior left upper lobe subsolid pulmonary nodule measures 9 mm on 37/3  and is similar to on the prior (when remeasured). A more lateral left upper lobe 6 mm subsolid nodule on 53/3 is favored to be similar to on the prior exam. Left lower lobe scarring. Upper Abdomen: Left hepatic lobe 12 mm cyst. Normal imaged portions of the stomach, pancreas, gallbladder, kidneys. Bilateral adrenal thickening and nodularity. Old granulomatous disease in the spleen. Musculoskeletal: No acute osseous abnormality. IMPRESSION: 1. Status post right upper lobectomy. 2. Small right pleural effusion with multiple pleural implants, suspicious for metastatic disease. 3. Similar left-sided sub solid pulmonary nodules, nonspecific. 4. Aortic atherosclerosis (ICD10-I70.0), coronary artery atherosclerosis and emphysema (ICD10-J43.9). Electronically Signed   By: KAbigail MiyamotoM.D.   On: 11/29/2019 12:22    ASSESSMENT AND PLAN: This is a very pleasant 69years old white male recently diagnosed with stage IIb (T1b, N1, M0) non-small  cell lung cancer, adenocarcinoma status post right upper lobectomy with lymph node dissection under the care of Dr. Roxan Hockey on June 17, 2019. The patient had molecular studies on the [facility] clinical trial that showed negative for EGFR mutation as well as ALK gene translocation but PD-L1 expression in the range of 50-100%. The patient underwent standard adjuvant systemic chemotherapy with cisplatin 75 mg/M2 and Alimta 500 mg/M2 every 3 weeks status post 3 cycles.  His treatment was discontinued secondary to intolerance. He had repeat CT scan of the chest performed recently.  I personally and independently reviewed the scan images and discussed the result and showed the images to the patient and his wife. Unfortunately his scan showed concerning findings for disease recurrence with small right pleural effusion as well as multiple pleural implants suspicious for metastatic disease. The patient also has a lot of neurological issues including dizzy spells as well as vomiting. I  recommended for the patient to have a PET scan as well as MRI of the brain performed next week for restaging of his disease and to rule out any disease recurrence or metastasis. He agreed to the current plan. The patient will come back for follow-up visit in less than 2 weeks for evaluation and more detailed discussion of his condition and treatment options based on the imaging studies. He was advised to call immediately if he has any concerning symptoms in the interval.  The patient voices understanding of current disease status and treatment options and is in agreement with the current care plan.  All questions were answered. The patient knows to call the clinic with any problems, questions or concerns. We can certainly see the patient much sooner if necessary.  Disclaimer: This note was dictated with voice recognition software. Similar sounding words can inadvertently be transcribed and may not be corrected upon review.

## 2019-12-10 ENCOUNTER — Telehealth: Payer: Self-pay | Admitting: Internal Medicine

## 2019-12-10 NOTE — Telephone Encounter (Signed)
Called and left msg about added appt. Mailed printout

## 2019-12-14 NOTE — Progress Notes (Signed)
[facility] OFFICE PROGRESS NOTE  Luis Huddle, MD [place] [address]  [place] [address]  DIAGNOSIS: Stage IIB(T1b, N1, M0) non-small cell lung cancer, invasive poorly differentiated carcinoma diagnosed in June 2021. Molecular studies are negative for EGFR mutation as well as ALK gene translocation. PD-L1 expression was 50-100%. This isbased on the report from the[facility] trial  PRIOR THERAPY:  1) Status post right upper lobectomy with lymph node dissection on June 17, 2019 under the care of Dr. Roxan Hockey. 2) Adjuvant systemic chemotherapy with cisplatin 75 mg/M2 and Alimta 500 mg/M2 every 3 weeks. First dose September 02, 2019.Status post 3 cycles.Discontinued after completing 3 of the planned 4 cycles due to intolerance.  CURRENT THERAPY: None  INTERVAL HISTORY: Luis [name] 69 y.o. male returns to the clinic today for a follow up visit. The patient is feeling fair today. He completed 3 of the 4 planned cycles of adjuvant chemotherapy. Treatment was discontinued after 3 cycles due to intolerance. He then had a repeat CT scan to restage his disease which showed suspicious metastatic disease with a small right pleural effusion and pleural based implants. He also was endorsing dizziness and nausea/vomiting/dizziness. Dr. Julien Nordmann recommended a PET scan and brain MRI to restage his disease.   The nausea/vomiting has subsided over the last few weeks. The patient's main complaint is persistent pain in the right lower rib cage since his surgery which has not subsided. The pain is an achy pain that is worse with certain positions. He takes tylenol which takes his pain from a 7/10 to a 3-4/10. The patient denies any fever or chills.  Tatient states that he has night sweats approximately 1-2 times per week.  He has a cough on and off which is unchanged.  He reports his baseline dyspnea on exertion.  He denies any diarrhea or constipation. The patient is here to review  his scan results and for a more detailed discussion about his current condition and recommended treatment options.    MEDICAL HISTORY: Past Medical History:  Diagnosis Date  . Arthritis    through out body  . Cancer ([place])   . Chronic bronchitis ([place])   . Concussion   . COPD (chronic obstructive pulmonary disease) ([place])   . Dyspnea   . Emphysema lung ([place])   . Frozen shoulder    LEFT  . GERD (gastroesophageal reflux disease)   . Hard of hearing   . Headache    alot of days, related to spine issues  . Hypertension   . Lumbar radiculopathy 2013  . Peyronie's disease   . Prostatitis 2011   Dr. Gaynelle Arabian  . RLS (restless legs syndrome)   . Shingles 06/13/2018   shingles on back, finished with prednisone, stills has scabs and pain  . Thigh pain    Bilateral Thigh  . Thigh pain 10/13   bilateral  . Tobacco abuse 2010   still uses electronic cig/ emphysema, SOB easily  . Wears partial dentures    upper    ALLERGIES:  is allergic to ativan [lorazepam], doxycycline, and tramadol.  MEDICATIONS:  Current Outpatient Medications  Medication Sig Dispense Refill  . acetaminophen (TYLENOL) 650 MG CR tablet Take 650 mg by mouth 2 (two) times daily as needed for pain. (Patient not taking: Reported on 12/02/2019)    . Cholecalciferol (VITAMIN D) 125 MCG (5000 UT) CAPS Take 5,000 Units by mouth daily.    [name] dexamethasone (DECADRON) 4 MG tablet 4 mg p.o. twice daily the  day before, day of and day after chemotherapy every 3 weeks. 40 tablet 0  . docusate sodium (COLACE) 100 MG capsule Take 100-200 mg by mouth 2 (two) times daily as needed for mild constipation.    . hydrocortisone cream 1 % Apply 1 application topically daily. Skin dermatitis    . Meclizine HCl 25 MG CHEW Chew 25 mg by mouth as directed. "Take one hour prior to travel for Motion sickness"    . OLANZapine (ZYPREXA) 5 MG tablet     . ondansetron (ZOFRAN) 8 MG tablet Take 1 tablet (8 mg total) by mouth every 8 (eight) hours  as needed for nausea or vomiting. (Patient not taking: Reported on 12/02/2019) 20 tablet 0  . oxyCODONE-acetaminophen (PERCOCET/ROXICET) 5-325 MG tablet Take 1 tablet by mouth every 6 (six) hours as needed for severe pain. 30 tablet 0  . pantoprazole (PROTONIX) 20 MG tablet Take 20 mg by mouth every evening. (Patient not taking: Reported on 12/02/2019)    . promethazine (PHENERGAN) 25 MG suppository Place 25 mg rectally every 8 (eight) hours as needed for nausea or vomiting. (Patient not taking: Reported on 12/02/2019)    . Tiotropium Bromide-Olodaterol (STIOLTO RESPIMAT) 2.5-2.5 MCG/ACT AERS Inhale 2 puffs into the lungs daily. 4 g 11  . valsartan-hydrochlorothiazide (DIOVAN-HCT) 80-12.5 MG tablet Take 1 tablet by mouth daily.    . vitamin B-12 (CYANOCOBALAMIN) 1000 MCG tablet Take 2,000 mcg by mouth daily.     No current facility-administered medications for this visit.    SURGICAL HISTORY:  Past Surgical History:  Procedure Laterality Date  . CATARACT EXTRACTION W/PHACO Right 07/08/2018   Procedure: CATARACT EXTRACTION PHACO AND INTRAOCULAR LENS PLACEMENT ([place]) RIGHT;  Surgeon: Leandrew Koyanagi, MD;  Location: [facility];  Service: Ophthalmology;  Laterality: Right;  . CATARACT EXTRACTION W/PHACO Left 07/29/2018   Procedure: CATARACT EXTRACTION PHACO AND INTRAOCULAR LENS PLACEMENT ([place]) LEFT TORIC LENS;  Surgeon: Leandrew Koyanagi, MD;  Location: [facility];  Service: Ophthalmology;  Laterality: Left;  . CHEST TUBE INSERTION Right 07/01/2019   Procedure: CHEST TUBE INSERTION;  Surgeon: [name], MD;  Location: [facility];  Service: Thoracic;  Laterality: Right;  . COLONOSCOPY    . EYE SURGERY    . INTERCOSTAL NERVE BLOCK Right 06/17/2019   Procedure: INTERCOSTAL NERVE BLOCK;  Surgeon: [name], MD;  Location: [facility];  Service: Thoracic;  Laterality: Right;  . INTERCOSTAL NERVE BLOCK Right 07/01/2019   Procedure: INTERCOSTAL NERVE BLOCK;  Surgeon:  [name], MD;  Location: [facility];  Service: Thoracic;  Laterality: Right;  . TONSILLECTOMY    . VIDEO ASSISTED THORACOSCOPY Right 07/01/2019   Procedure: VIDEO ASSISTED THORACOSCOPY - REMOVAL OF RIGHT MIDDLE LOBE BLEBS;  Surgeon: [name], MD;  Location: [facility];  Service: Thoracic;  Laterality: Right;  [name] VIDEO BRONCHOSCOPY WITH INSERTION OF INTERBRONCHIAL VALVE (IBV) Right 06/25/2019   Procedure: VIDEO BRONCHOSCOPY WITH INSERTION OF INTERBRONCHIAL VALVE (IBV); Size 7 IBV into Right Loer Lobe (RLL) Superior Segment, Size 9 IBV into RLL Basilar Segment;  Surgeon: [name], MD;  Location: [facility];  Service: Thoracic;  Laterality: Right;  [name] VIDEO BRONCHOSCOPY WITH INSERTION OF INTERBRONCHIAL VALVE (IBV) N/A 08/06/2019   Procedure: VIDEO BRONCHOSCOPY WITH REMOVAL OF INTERBRONCHIAL VALVE (IBV);  Surgeon: [name], MD;  Location[facility];  Service: Thoracic;  Laterality: N/A;    REVIEW OF SYSTEMS:   Review of Systems  Constitutional: Positive for weight loss. Negative for appetite change, chills, fatigue,  and fever.  HENT: Negative for mouth sores, nosebleeds, sore throat and trouble swallowing.   Eyes: Negative for eye problems and icterus.  Respiratory: Positive for baseline dyspnea on exertion and cough. Negative for  hemoptysis and wheezing.   Cardiovascular: Positive for right sided chest discomfort. Negative for leg swelling.  Gastrointestinal: Negative for abdominal pain, constipation, diarrhea, nausea and vomiting.  Genitourinary: Negative for bladder incontinence, difficulty urinating, dysuria, frequency and hematuria.   Musculoskeletal: Negative for back pain, gait problem, neck pain and neck stiffness.  Skin: Negative for itching and rash.  Neurological: Negative for dizziness, extremity weakness, gait problem, headaches, light-headedness and seizures.  Hematological: Negative for adenopathy. Does not bruise/bleed easily.  Psychiatric/Behavioral:  Negative for confusion, depression and sleep disturbance. The patient is not nervous/anxious.     PHYSICAL EXAMINATION:  Blood pressure 132/76, pulse 91, temperature (!) 97.5 F (36.4 C), temperature source Tympanic, resp. rate 18, height 5' 6"  (1.676 m), weight 131 lb 11.2 oz (59.7 kg), SpO2 100 %.  ECOG PERFORMANCE STATUS: 1 - Symptomatic but completely ambulatory  Physical Exam  Constitutional: Oriented to person, place, and time and well-developed, well-nourished, and in no distress.  HENT:  Head: Normocephalic and atraumatic.  Mouth/Throat: Oropharynx is clear and moist. No oropharyngeal exudate.  Eyes: Conjunctivae are normal. Right eye exhibits no discharge. Left eye exhibits no discharge. No scleral icterus.  Neck: Normal range of motion. Neck supple.  Cardiovascular: Normal rate, regular rhythm, normal heart sounds and intact distal pulses.   Pulmonary/Chest: Effort normal and breath sounds normal. No respiratory distress. No wheezes. No rales.  Abdominal: Soft. Bowel sounds are normal. Exhibits no distension and no mass. There is no tenderness.  Musculoskeletal: Normal range of motion. Exhibits no edema.  Lymphadenopathy:    No cervical adenopathy.  Neurological: Alert and oriented to person, place, and time. Exhibits normal muscle tone. Gait normal. Coordination normal.  Skin: Skin is warm and dry. No rash noted. Not diaphoretic. No erythema. No pallor.  Psychiatric: Mood, memory and judgment normal.  Vitals reviewed.  LABORATORY DATA: Lab Results  Component Value Date   WBC 3.5 (L) 11/04/2019   HGB 11.9 (L) 11/04/2019   HCT 34.6 (L) 11/04/2019   MCV 85.2 11/04/2019   PLT 426 (H) 11/04/2019      Chemistry      Component Value Date/Time   NA 134 (L) 11/04/2019 0852   K 4.4 11/04/2019 0852   CL 97 (L) 11/04/2019 0852   CO2 21 (L) 11/04/2019 0852   BUN 15 11/04/2019 0852   CREATININE 1.18 11/04/2019 0852      Component Value Date/Time   CALCIUM 9.7 11/04/2019  0852   ALKPHOS 92 11/04/2019 0852   AST 7 (L) 11/04/2019 0852   ALT <6 11/04/2019 0852   BILITOT 0.3 11/04/2019 0852       RADIOGRAPHIC STUDIES:  CT Chest W Contrast  Result Date: 11/29/2019 CLINICAL DATA:  Stage II non-small cell lung cancer. Status post right upper lobectomy and lymph node dissection 06/17/2019. Last chemotherapy 6 weeks ago. Status post vats on 07/01/2019. EXAM: CT CHEST WITH CONTRAST TECHNIQUE: Multidetector CT imaging of the chest was performed during intravenous contrast administration. CONTRAST:  80m OMNIPAQUE IOHEXOL 300 MG/ML  SOLN COMPARISON:  05/17/2019 pet and 04/26/2019 chest ct. FINDINGS: Cardiovascular: Aortic atherosclerosis. Normal heart size, without pericardial effusion. Lad and right coronary artery calcification No central pulmonary embolism, on this non-dedicated study. Mediastinum/Nodes: No supraclavicular adenopathy. No mediastinal or hilar adenopathy. Lungs/Pleura: Small right pleural effusion.  Area of hyperattenuation within the right pleural space are suspicious for pleural implants. Example on image 128 and 124 of series 2. A larger soft tissue density within the pleural space measures 1.4 by 1.9 cm on 88/2. No left-sided pleural effusion. Moderate to marked bullous type emphysema.  Right upper lobectomy. Left posterior left upper lobe subsolid pulmonary nodule measures 9 mm on 37/3 and is similar to on the prior (when remeasured). A more lateral left upper lobe 6 mm subsolid nodule on 53/3 is favored to be similar to on the prior exam. Left lower lobe scarring. Upper Abdomen: Left hepatic lobe 12 mm cyst. Normal imaged portions of the stomach, pancreas, gallbladder, kidneys. Bilateral adrenal thickening and nodularity. Old granulomatous disease in the spleen. Musculoskeletal: No acute osseous abnormality. IMPRESSION: 1. Status post right upper lobectomy. 2. Small right pleural effusion with multiple pleural implants, suspicious for metastatic disease. 3.  Similar left-sided sub solid pulmonary nodules, nonspecific. 4. Aortic atherosclerosis (ICD10-I70.0), coronary artery atherosclerosis and emphysema (ICD10-J43.9). Electronically Signed   By: Abigail Miyamoto M.D.   On: 11/29/2019 12:22   MR BRAIN W WO CONTRAST  Result Date: 12/15/2019 CLINICAL DATA:  Non-small cell lung cancer.  Staging. EXAM: MRI HEAD WITHOUT AND WITH CONTRAST TECHNIQUE: Multiplanar, multiecho pulse sequences of the brain and surrounding structures were obtained without and with intravenous contrast. CONTRAST:  67m GADAVIST GADOBUTROL 1 MMOL/ML IV SOLN COMPARISON:  Head CT April 26, 2019. FINDINGS: Brain: No acute infarction, hemorrhage, hydrocephalus, extra-axial collection or mass lesion. No focus of abnormal contrast enhancement to suggest metastatic disease to the brain. Vascular: Normal flow voids. Skull and upper cervical spine: Normal marrow signal. Sinuses/Orbits: Mucous retention cyst in the right maxillary sinus. Bilateral lens surgery. Other: None. IMPRESSION: 1. No evidence of intracranial metastatic disease. 2. Unremarkable MRI of the brain. Electronically Signed   By: KPedro EarlsM.D.   On: 12/15/2019 19:49   NM PET Image Restag (PS) Skull Base To Thigh  Result Date: 12/16/2019 CLINICAL DATA:  Initial treatment strategy for right upper lobe non-small cell lung cancer status post right upper lobectomy and adjuvant chemotherapy, with concern for right pleural recurrence on recent chest CT. EXAM: NUCLEAR MEDICINE PET SKULL BASE TO THIGH TECHNIQUE: 6.9 mCi F-18 FDG was injected intravenously. Full-ring PET imaging was performed from the skull base to thigh after the radiotracer. CT data was obtained and used for attenuation correction and anatomic localization. Fasting blood glucose: 78 mg/dl COMPARISON:  11/29/2019 chest CT.  05/17/2019 PET-CT. FINDINGS: Mediastinal blood pool activity: SUV max 2.4 Liver activity: SUV max NA NECK: No hypermetabolic lymph nodes in  the neck. Incidental CT findings: none CHEST: Widespread hypermetabolic right pleural soft tissue deposits with small right pleural effusion. Representative anterior inferior 1.6 cm right pleural lesion with max SUV 12.8 (series 4/image 102). Representative posteroinferior 1.1 cm right pleural lesion with max SUV 14.3 (series 4/image 105). Representative 1.9 cm posterior right mid pleural lesion with apparent invasion of the extrapleural fat with max SUV 15.6 (series 4/image 86). No hypermetabolic left pleural deposits. No hypermetabolic pulmonary findings. Subsolid posterior left upper lobe 1.8 cm and 0.7 cm pulmonary nodules demonstrate no appreciable FDG uptake (series 8/images 14 and 22, respectively). No enlarged or hypermetabolic axillary, mediastinal or hilar lymph nodes. Incidental CT findings: Coronary atherosclerosis. Atherosclerotic nonaneurysmal thoracic aorta. Postsurgical changes from right upper lobectomy. Severe centrilobular emphysema. ABDOMEN/PELVIS: No abnormal hypermetabolic activity within the liver, pancreas, adrenal glands, or spleen. No hypermetabolic lymph nodes in the abdomen  or pelvis. Incidental CT findings: Simple 1.0 cm left liver cyst. Scattered granulomatous splenic calcifications. Simple bilateral renal cysts, largest 4.7 cm in the lower right kidney. Atherosclerotic nonaneurysmal abdominal aorta. Top-normal size prostate. Mild sigmoid diverticulosis. SKELETON: No focal hypermetabolic activity to suggest skeletal metastasis. Incidental CT findings: none IMPRESSION: 1. Widespread hypermetabolic right pleural soft tissue deposits with small right pleural effusion, compatible with right pleural metastatic disease. 2. No additional sites of hypermetabolic metastatic disease. 3. Left upper lobe subsolid pulmonary nodules measuring up to 1.8 cm demonstrate no appreciable FDG uptake and can be reassessed on future follow-up chest CT studies. 4. Chronic findings include: Aortic  Atherosclerosis (ICD10-I70.0) and Emphysema (ICD10-J43.9). Coronary atherosclerosis. Mild sigmoid diverticulosis. Electronically Signed   By: Ilona Sorrel M.D.   On: 12/16/2019 10:18     ASSESSMENT/PLAN:  This is a very pleasant 69 year old Caucasian male initially diagnosed with stage IIb (T1b, N1, M0) non-small cell lung cancer, adenocarcinoma. He is status post a right upper lobe lobectomy with lymph node dissection under the care of Dr. Roxan Hockey. This was performed on June 17, 2019. He has no actionable mutations. His PD-L1 expression was estimated to be in the range of 50 to 100% by the molecular studies performed by the [facility]chemist clinical trial.   The patient completed 3 of the 4 planned adjuvant chemotherapy cycles with cisplatin 75 mg per metered square and Alimta 500 mg/m. This was discontinued after cycle #3 due to intolerance.  The patient recently had a repeat CT scan which showed  concerning findings for disease recurrence with small right pleural effusion as well as multiple pleural implants suspicious for metastatic disease. He also was endorsing several neurological symptoms with dizzy spells and nausea and vomiting.   He had a PET scan and brain MRI performed. Dr. Julien Nordmann personally and independently reviewed the scan and discussed the results with the patient. The MRI was negative for metastatic disease to the brain. The PET scan has not been read by radiology but appears he has some right pleural based hypermetabolism concerning for metastatic disease. It also appears he has some liver hypermetabolism.    Dr. Julien Nordmann recommends that the patient undergo a biopsy of either the liver lesions or the right pleural lesions to confirm that this is indeed metastatic disease. We will arrange for the patient to have guardant 360 molecular testing performed today with blood first tissue next.   The patient mentions that he would not want to have any more chemotherapy. Of note, the  patient's PD-L1 expression, which is a marker for immunotherapy was high between 50-100% which indicates he is likely a good candidate for single agent immunotherapy.   Dr. Julien Nordmann discussed that his pain may be secondary to the pleural-based nodules. We will send in a prescription for Percocet for pain control. If these areas are proven to be metastatic disease, we will consider referring the patient to radiation oncology for palliative radiotherapy.  We'll see the patient back for follow-up visit in 2 weeks for evaluation and to review the results of his biopsy and for more detailed discussion about potential treatment options  The patient was advised to call immediately if he has any concerning symptoms in the interval. The patient voices understanding of current disease status and treatment options and is in agreement with the current care plan. All questions were answered. The patient knows to call the clinic with any problems, questions or concerns. We can certainly see the patient much sooner if necessary  Orders Placed This Encounter  Procedures  . [place] CORE BIOPSY (LIVER)    If the right pleural nodules are more accessible, then please feel free to do the CT guided biopsy instead if the radiologist feels that this is the better way to go. I will cosign the order if entered. Will need enough biopsy for molecular studies.    Standing Status:   Future    Standing Expiration Date:   12/15/2020    Order Specific Question:   Lab orders requested (DO NOT place separate lab orders, these will be automatically ordered during procedure specimen collection):    Answer:   Surgical Pathology    Order Specific Question:   Reason for Exam (SYMPTOM  OR DIAGNOSIS REQUIRED)    Answer:   Ultrasound guided biopsy of the liver lesions. Hx of stage II lung cancer. Please obtain enough samples to send for molecular studies.    Order Specific Question:   Preferred location?    Answer:   [facility]    Standing Status:   Future    Standing Expiration Date:   12/15/2020     Tobe Sos Arwen Haseley, PA-C 12/16/19  ADDENDUM: Hematology/Oncology Attending: I had a face-to-face encounter with the patient. I recommended his care plan. This is a very pleasant 69 years old white male with metastatic non-small cell lung cancer initially diagnosed as a stage IIb status post right upper lobectomy with lymph node dissection under the care of Dr. Roxan Hockey on June 17, 2019. He was tested with no actionable mutation and PD-L1 expression was estimated to be over 50% by the [facility]chemist trial. The patient is started on adjuvant treatment with cisplatin and Alimta but he could not complete more than 3 cycle and this was discontinued secondary to intolerability. He had repeat imaging studies after completion of his adjuvant therapy and unfortunately showed evidence for disease recurrence and the right hemithorax. I ordered a PET scan which was performed recently. The results were not available at the time of the visit but they were available later today before the dictation of my note. The PET scan showed widespread hypermetabolic right pleural soft tissue deposits with a small right pleural effusion compatible with right pleural metastatic disease. There was no additional sites of hypermetabolic metastatic disease in the abdomen or pelvis. There was also left upper lobe subsolid pulmonary nodule measuring up to 1.8 cm with no FDG uptake but needs follow-up on future imaging studies. I had a lengthy discussion with the patient and his wife about his current condition and treatment options. I recommended for them to have repeat biopsy of one of the metastatic lesion for confirmation of the tissue diagnosis and also to have enough tissue for additional molecular studies. We will also order a guardant 360 blood for his tissue next with PD-L1 expression on the newer biopsy. The patient will come back  for follow-up visit in around 2 weeks for evaluation and more detailed discussion of his treatment options. His MRI of the brain was negative for metastatic disease. He was advised to call immediately if he has any concerning symptoms in the interval.  Disclaimer: This note was dictated with voice recognition software. Similar sounding words can inadvertently be transcribed and may be missed upon review. Eilleen Kempf, MD 12/18/19

## 2019-12-15 ENCOUNTER — Other Ambulatory Visit: Payer: Self-pay

## 2019-12-15 ENCOUNTER — Ambulatory Visit (HOSPITAL_COMMUNITY)
Admission: RE | Admit: 2019-12-15 | Discharge: 2019-12-15 | Disposition: A | Discharge status: Home / Self Care | Payer: Medicare Other | Source: Ambulatory Visit | Attending: Internal Medicine | Admitting: Internal Medicine

## 2019-12-15 DIAGNOSIS — C3490 Malignant neoplasm of unspecified part of unspecified bronchus or lung: Secondary | ICD-10-CM | POA: Insufficient documentation

## 2019-12-15 DIAGNOSIS — I700 Atherosclerosis of aorta: Secondary | ICD-10-CM | POA: Diagnosis not present

## 2019-12-15 DIAGNOSIS — J90 Pleural effusion, not elsewhere classified: Secondary | ICD-10-CM | POA: Diagnosis not present

## 2019-12-15 DIAGNOSIS — J341 Cyst and mucocele of nose and nasal sinus: Secondary | ICD-10-CM | POA: Diagnosis not present

## 2019-12-15 DIAGNOSIS — I2510 Atherosclerotic heart disease of native coronary artery without angina pectoris: Secondary | ICD-10-CM | POA: Diagnosis not present

## 2019-12-15 DIAGNOSIS — J439 Emphysema, unspecified: Secondary | ICD-10-CM | POA: Insufficient documentation

## 2019-12-15 DIAGNOSIS — K5730 Diverticulosis of large intestine without perforation or abscess without bleeding: Secondary | ICD-10-CM | POA: Diagnosis not present

## 2019-12-15 LAB — GLUCOSE, CAPILLARY: Glucose-Capillary: 78 mg/dL (ref 70–99)

## 2019-12-15 MED ORDER — GADOBUTROL 1 MMOL/ML IV SOLN
6.0000 mL | Freq: Once | INTRAVENOUS | Status: AC | PRN
  Administered 2019-12-15: 6 mL via INTRAVENOUS

## 2019-12-15 MED ORDER — FLUDEOXYGLUCOSE F - 18 (FDG) INJECTION
6.9000 | Freq: Once | INTRAVENOUS | Status: AC
  Administered 2019-12-15: 6.9 via INTRAVENOUS

## 2019-12-16 ENCOUNTER — Inpatient Hospital Stay: Payer: Medicare Other

## 2019-12-16 ENCOUNTER — Inpatient Hospital Stay: Payer: Medicare Other | Attending: Internal Medicine | Admitting: Physician Assistant

## 2019-12-16 ENCOUNTER — Encounter: Payer: Self-pay | Admitting: Internal Medicine

## 2019-12-16 ENCOUNTER — Other Ambulatory Visit: Payer: Self-pay

## 2019-12-16 ENCOUNTER — Encounter (HOSPITAL_COMMUNITY): Payer: Self-pay

## 2019-12-16 VITALS — BP 132/76 | HR 91 | Temp 97.5°F | Resp 18 | Ht 66.0 in | Wt 131.7 lb

## 2019-12-16 DIAGNOSIS — Z79899 Other long term (current) drug therapy: Secondary | ICD-10-CM | POA: Diagnosis not present

## 2019-12-16 DIAGNOSIS — C3491 Malignant neoplasm of unspecified part of right bronchus or lung: Secondary | ICD-10-CM

## 2019-12-16 DIAGNOSIS — Z9221 Personal history of antineoplastic chemotherapy: Secondary | ICD-10-CM | POA: Insufficient documentation

## 2019-12-16 DIAGNOSIS — J449 Chronic obstructive pulmonary disease, unspecified: Secondary | ICD-10-CM | POA: Diagnosis not present

## 2019-12-16 DIAGNOSIS — Z7189 Other specified counseling: Secondary | ICD-10-CM | POA: Diagnosis not present

## 2019-12-16 DIAGNOSIS — K219 Gastro-esophageal reflux disease without esophagitis: Secondary | ICD-10-CM | POA: Diagnosis not present

## 2019-12-16 DIAGNOSIS — G893 Neoplasm related pain (acute) (chronic): Secondary | ICD-10-CM | POA: Insufficient documentation

## 2019-12-16 DIAGNOSIS — I10 Essential (primary) hypertension: Secondary | ICD-10-CM | POA: Diagnosis not present

## 2019-12-16 DIAGNOSIS — C3492 Malignant neoplasm of unspecified part of left bronchus or lung: Secondary | ICD-10-CM | POA: Diagnosis not present

## 2019-12-16 DIAGNOSIS — C3411 Malignant neoplasm of upper lobe, right bronchus or lung: Secondary | ICD-10-CM | POA: Diagnosis not present

## 2019-12-16 DIAGNOSIS — Z902 Acquired absence of lung [part of]: Secondary | ICD-10-CM | POA: Insufficient documentation

## 2019-12-16 MED ORDER — OXYCODONE-ACETAMINOPHEN 5-325 MG PO TABS: 1.0000 | ORAL_TABLET | Freq: Four times a day (QID) | ORAL | 0 refills | Status: DC | PRN

## 2019-12-16 NOTE — Progress Notes (Signed)
JA 1  Kingsland Conley [place]" Male, 69 y.o., 03/30/1950 MRN:  [id number] Phone:  [phone] Jerilynn Mages) PCP:  [name], MD Primary Cvg:  Medicare/Medicare Part A And B  RE: Biopsy Received: Today Message Details  Suttle, Rosanne Ashing, MD  [facility] No liver lesions are present on PET.    Would a diagnostic thoracentesis be a good place to start?   Thanks,   Dylan   Previous Messages  ----- Message -----  From: Lenore Cordia  Sent: 12/16/2019  2:34 PM EST  To: [facility] Procedure Requests  Subject: Biopsy                      Procedure Requested:Ultrasound guided biopsy of the liver lesions.    Reason for Procedure: Hx of stage II lung cancer. Please obtain enough samples to send for molecular studies.    Provider Requesting: Heilingoetter, Cassandra L  Provider Telephone: [phone]    Msg in order from Heilingoetter, Cassandra L:   If the right pleural nodules are more accessible, then please feel free to do the CT guided biopsy instead if the radiologist feels that this is the better way to go. I will cosign the order if entered. Will need enough biopsy for molecular studies.

## 2019-12-17 ENCOUNTER — Telehealth: Payer: Self-pay

## 2019-12-17 ENCOUNTER — Other Ambulatory Visit: Payer: Self-pay | Admitting: Internal Medicine

## 2019-12-17 ENCOUNTER — Telehealth: Payer: Self-pay | Admitting: Internal Medicine

## 2019-12-17 DIAGNOSIS — C3491 Malignant neoplasm of unspecified part of right bronchus or lung: Secondary | ICD-10-CM

## 2019-12-17 NOTE — Telephone Encounter (Signed)
Release: [id number] Faxed medical records to Dr Aniceto Boss @ [phone] fax# [phone].

## 2019-12-17 NOTE — Telephone Encounter (Signed)
Call received from pts wife requesting his records be sent to Dr. Lissa Morales at [facility] for a 2nd opinion.  I have sent a staff message to HIM with this request along with the following information:   [place], [place] [address]  [facility]: [phone]  [facility]: [phone]

## 2019-12-18 ENCOUNTER — Encounter: Payer: Self-pay | Admitting: Physician Assistant

## 2019-12-20 ENCOUNTER — Encounter (HOSPITAL_COMMUNITY): Payer: Self-pay

## 2019-12-20 ENCOUNTER — Encounter: Payer: Self-pay | Admitting: Internal Medicine

## 2019-12-20 ENCOUNTER — Telehealth: Payer: Self-pay | Admitting: Physician Assistant

## 2019-12-20 NOTE — Progress Notes (Unsigned)
Luis [name]. Luis [name]" Male, 69 y.o., Nov 15, 1950 MRN:  [id number] Phone:  [phone] Luis [name]) PCP:  [name], MD Primary Cvg:  [facility] ([facility]) 12/30/2019 at 8:00 AM  RE: Biopsy Received: 3 days ago Message Details  Luis Mckusick, DO  Luis [name]; Luis [name] OK for CT guided biopsy of pleural mass. Would also use [place] in the CT scanner room after localization.   FDG avid anterior lesion adjacent to liver may be best. Rapid growth over 2 weeks.   [name]. Dr. Kathlene Cote as second if possible.   Earleen [place]   Previous Messages  ----- Message -----  From: Lenore Cordia  Sent: 12/17/2019 11:16 AM EST  To: [name]  Subject: Biopsy                      Procedure Requested: CT Biopsy    Reason for Procedure: pleural-based mass   Provider Requesting: Dr Curt Bears  Provider Telephone: [phone]    Other Info:

## 2019-12-20 NOTE — Telephone Encounter (Signed)
I called the patient to review the final results for the PET scan. Dr. Julien Nordmann would like to have a CT guided biopsy of the pleural lesions. Discussed this with the patient. Someone from radiology should be in touch with the patient to schedule soon. He expressed understanding with the plan.

## 2019-12-21 ENCOUNTER — Telehealth: Payer: Self-pay | Admitting: Physician Assistant

## 2019-12-21 NOTE — Telephone Encounter (Signed)
Scheduled per los. Called and spoke with patient. Confirmed appt 

## 2019-12-27 ENCOUNTER — Other Ambulatory Visit: Payer: Self-pay | Admitting: Radiology

## 2019-12-27 ENCOUNTER — Other Ambulatory Visit: Payer: Self-pay

## 2019-12-27 ENCOUNTER — Other Ambulatory Visit
Admission: RE | Admit: 2019-12-27 | Discharge: 2019-12-27 | Disposition: A | Discharge status: Home / Self Care | Payer: Medicare Other | Source: Ambulatory Visit | Attending: Internal Medicine | Admitting: Internal Medicine

## 2019-12-27 DIAGNOSIS — Z20822 Contact with and (suspected) exposure to covid-19: Secondary | ICD-10-CM | POA: Diagnosis not present

## 2019-12-27 DIAGNOSIS — Z01812 Encounter for preprocedural laboratory examination: Secondary | ICD-10-CM | POA: Insufficient documentation

## 2019-12-27 LAB — GUARDANT 360

## 2019-12-27 NOTE — Progress Notes (Signed)
Luis [name] OFFICE PROGRESS NOTE  [name], MD [place] [address] [place] [address]  DIAGNOSIS: Stage IIB(T1b, N1, M0) non-small cell lung cancer, invasive poorly differentiated carcinoma diagnosed in June 2021. Molecular studies are negative for EGFR mutation as well as ALK gene translocation. PD-L1 expression was 50-100%. This isbased on the report from theAlchemist trial. PD-L1 expression by guardant: 75%.   PRIOR THERAPY:  1) Status post right upper lobectomy with lymph node dissection on June 17, 2019 under the care of Dr. Roxan Hockey. 2) Adjuvant systemic chemotherapy with cisplatin 75 mg/M2 and Alimta 500 mg/M2 every 3 weeks. First dose September 02, 2019.Status post3cycles.Discontinued after completing 3 of the planned 4 cycles due to intolerance.  CURRENT THERAPY: None  INTERVAL HISTORY: Luis [name] 69 y.o. male returns to the clinic today for a follow up visit accompanied by his wife. The patient is feeling fair today. He completed 3 of the 4 planned cycles of adjuvant chemotherapy. Treatment was discontinued after 3 cycles due to intolerance. He then had a repeat CT scan to restage his disease which showed suspicious metastatic disease with a small right pleural effusion and pleural based implants. He also was endorsing dizziness and nausea/vomiting/dizziness. Dr. Julien Nordmann recommended a PET scan and brain MRI to restage his disease. His brain MRI was negative for metastatic disease to the brain but the PET scan showed hypermetabolic right pleural soft tissue deposits with a small right pleural effusion, compatible with right pleural metastatic disease. The patient underwent a pleural based biopsy on 12/30/19. He was referred to Dr. Aniceto Boss at [facility] for a second opinion but has not seen him yet due to a scheduling conflict with his biopsy. The patient peviously mentioned that he did not want further chemotherapy but would be open to hearing what  other therapies he is a candidate for. His guardant 360 is negative for actionable mutations. His PDL1 expression is still pending. His biopsy is also being sent for Guardant 360 blood first, tissue next, since his blood test was negative for actionable mutations. However, His PD-L1 expression was estimated to be in the range of 50 to 100% by the molecular studies performed by the alchemist clinical trial.   Today, the patient is reporting similar. The patient's main complaint is persistent pain in the right lower rib cage since his surgery which has not subsided. The pain is an achy pain that is worse with certain positions. He takes tylenol which takes his pain level down. He tried taking percocet 1-2 times without significant improvement; therefore, he stopped taking it. He also takes 200 mg TID of gabapentin. The patient denies any feveror chills.The patient states that he has night sweats approximately 1-2 times per week.He has a cough on and off which is unchanged. He reports his baseline dyspnea on exertion. He denies any diarrhea or constipation. The patient is here today for evaluation and to review the results of his biopsy and molecular studies.   MEDICAL HISTORY: Past Medical History:  Diagnosis Date  . Arthritis    through out body  . Cancer ([place])   . Chronic bronchitis ([place])   . Concussion   . COPD (chronic obstructive pulmonary disease) ([place])   . Dyspnea   . Emphysema lung ([place])   . Frozen shoulder    LEFT  . GERD (gastroesophageal reflux disease)   . Hard of hearing   . Headache    alot of days, related to spine issues  . Hypertension   .  Lumbar radiculopathy 2013  . Peyronie's disease   . Prostatitis 2011   Dr. Gaynelle Arabian  . RLS (restless legs syndrome)   . Shingles 06/13/2018   shingles on back, finished with prednisone, stills has scabs and pain  . Thigh pain    Bilateral Thigh  . Thigh pain 10/13   bilateral  . Tobacco abuse 2010   still uses electronic  cig/ emphysema, SOB easily  . Wears partial dentures    upper    ALLERGIES:  is allergic to ativan [lorazepam], doxycycline, and tramadol.  MEDICATIONS:  Current Outpatient Medications  Medication Sig Dispense Refill  . gabapentin (NEURONTIN) 100 MG capsule 1-2 capsules    . guaiFENesin (MUCINEX) 600 MG 12 hr tablet 1 tablet as needed    . vitamin B-12 (CYANOCOBALAMIN) 500 MCG tablet 1 tablet    . acetaminophen (TYLENOL) 500 MG tablet Take 1,000 mg by mouth at bedtime as needed for moderate pain.    . Cholecalciferol (VITAMIN D) 125 MCG (5000 UT) CAPS Take 5,000 Units by mouth 3 (three) times a week.    . citalopram (CELEXA) 20 MG tablet Take 1 tablet by mouth daily.    [name] oxyCODONE-acetaminophen (PERCOCET/ROXICET) 5-325 MG tablet Take 1 tablet by mouth every 6 (six) hours as needed for severe pain. (Patient not taking: Reported on 12/28/2019) 30 tablet 0  . pantoprazole (PROTONIX) 20 MG tablet 1 tablet    . Tiotropium Bromide-Olodaterol (STIOLTO RESPIMAT) 2.5-2.5 MCG/ACT AERS Inhale 2 puffs into the lungs daily. 4 g 11  . valsartan-hydrochlorothiazide (DIOVAN-HCT) 80-12.5 MG tablet Take 1 tablet by mouth daily.     No current facility-administered medications for this visit.    SURGICAL HISTORY:  Past Surgical History:  Procedure Laterality Date  . CATARACT EXTRACTION W/PHACO Right 07/08/2018   Procedure: CATARACT EXTRACTION PHACO AND INTRAOCULAR LENS PLACEMENT ([place]) RIGHT;  Surgeon: Leandrew Koyanagi, MD;  Location: [place];  Service: Ophthalmology;  Laterality: Right;  . CATARACT EXTRACTION W/PHACO Left 07/29/2018   Procedure: CATARACT EXTRACTION PHACO AND INTRAOCULAR LENS PLACEMENT ([place]) LEFT TORIC LENS;  Surgeon: Leandrew Koyanagi, MD;  Location: [place];  Service: Ophthalmology;  Laterality: Left;  . CHEST TUBE INSERTION Right 07/01/2019   Procedure: CHEST TUBE INSERTION;  Surgeon: [name], MD;  Location: [place];  Service: Thoracic;   Laterality: Right;  . COLONOSCOPY    . EYE SURGERY    . INTERCOSTAL NERVE BLOCK Right 06/17/2019   Procedure: INTERCOSTAL NERVE BLOCK;  Surgeon: [name], MD;  Location: [place];  Service: Thoracic;  Laterality: Right;  . INTERCOSTAL NERVE BLOCK Right 07/01/2019   Procedure: INTERCOSTAL NERVE BLOCK;  Surgeon: [name], MD;  Location: [place];  Service: Thoracic;  Laterality: Right;  . TONSILLECTOMY    . VIDEO ASSISTED THORACOSCOPY Right 07/01/2019   Procedure: VIDEO ASSISTED THORACOSCOPY - REMOVAL OF RIGHT MIDDLE LOBE BLEBS;  Surgeon: [name], MD;  Location: [place];  Service: Thoracic;  Laterality: Right;  [name] VIDEO BRONCHOSCOPY WITH INSERTION OF INTERBRONCHIAL VALVE (IBV) Right 06/25/2019   Procedure: VIDEO BRONCHOSCOPY WITH INSERTION OF INTERBRONCHIAL VALVE (IBV); Size 7 IBV into Right Loer Lobe (RLL) Superior Segment, Size 9 IBV into RLL Basilar Segment;  Surgeon: [name], MD;  [facility];  Service: Thoracic;  Laterality: Right;  [name] VIDEO BRONCHOSCOPY WITH INSERTION OF INTERBRONCHIAL VALVE (IBV) N/A 08/06/2019   Procedure: VIDEO BRONCHOSCOPY WITH REMOVAL OF INTERBRONCHIAL VALVE (IBV);  Surgeon: [name], MD;  Location: [place];  Service: Thoracic;  Laterality: N/A;    REVIEW OF SYSTEMS:   Review of Systems  Constitutional: Negative for appetite change, weight loss, chills, fatigue, and fever.  HENT: Negative for mouth sores, nosebleeds, sore throat and trouble swallowing.   Eyes: Negative for eye problems and icterus.  Respiratory: Positive for baseline dyspnea on exertion and cough. Negative for  hemoptysis and wheezing.   Cardiovascular: Positive for right sided chest discomfort. Negative for leg swelling.  Gastrointestinal: Negative for abdominal pain, constipation, diarrhea, nausea and vomiting.  Genitourinary: Negative for bladder incontinence, difficulty urinating, dysuria, frequency and hematuria.   Musculoskeletal: Negative  for back pain, gait problem, neck pain and neck stiffness.  Skin: Negative for itching and rash.  Neurological: Positive for intermittent dizziness. Negative for extremity weakness, gait problem, headaches, light-headedness and seizures.  Hematological: Negative for adenopathy. Does not bruise/bleed easily.  Psychiatric/Behavioral: Negative for confusion, depression and sleep disturbance. The patient is not nervous/anxious.   PHYSICAL EXAMINATION:  Blood pressure 130/74, pulse (!) 103, temperature (!) 96.7 F (35.9 C), temperature source Tympanic, resp. rate 17, height 5' 6"  (1.676 m), weight 140 lb 3.2 oz (63.6 kg), SpO2 99 %.  ECOG PERFORMANCE STATUS: 1 - Symptomatic but completely ambulatory  Physical Exam  Constitutional: Oriented to person, place, and time and well-developed, well-nourished, and in no distress.  HENT:  Head: Normocephalic and atraumatic.  Mouth/Throat: Oropharynx is clear and moist. No oropharyngeal exudate.  Eyes: Conjunctivae are normal. Right eye exhibits no discharge. Left eye exhibits no discharge. No scleral icterus.  Neck: Normal range of motion. Neck supple.  Cardiovascular: Normal rate, regular rhythm, normal heart sounds and intact distal pulses.   Pulmonary/Chest: Effort normal and breath sounds normal. No respiratory distress. No wheezes. No rales.  Abdominal: Soft. Bowel sounds are normal. Exhibits no distension and no mass. There is no tenderness.  Musculoskeletal: Normal range of motion. Exhibits no edema.  Lymphadenopathy:    No cervical adenopathy.  Neurological: Alert and oriented to person, place, and time. Exhibits normal muscle tone. Gait normal. Coordination normal.  Skin: Skin is warm and dry. No rash noted. Not diaphoretic. No erythema. No pallor.  Psychiatric: Mood, memory and judgment normal.  Vitals reviewed.  LABORATORY DATA: Lab Results  Component Value Date   WBC 7.0 12/30/2019   HGB 12.8 (L) 12/30/2019   HCT 37.4 (L) 12/30/2019    MCV 87.6 12/30/2019   PLT 302 12/30/2019      Chemistry      Component Value Date/Time   NA 138 12/30/2019 0630   K 4.0 12/30/2019 0630   CL 101 12/30/2019 0630   CO2 25 12/30/2019 0630   BUN 15 12/30/2019 0630   CREATININE 1.17 12/30/2019 0630   CREATININE 1.18 11/04/2019 0852      Component Value Date/Time   CALCIUM 9.5 12/30/2019 0630   ALKPHOS 92 11/04/2019 0852   AST 7 (L) 11/04/2019 0852   ALT <6 11/04/2019 0852   BILITOT 0.3 11/04/2019 0852       RADIOGRAPHIC STUDIES:  MR BRAIN W WO CONTRAST  Result Date: 12/15/2019 CLINICAL DATA:  Non-small cell lung cancer.  Staging. EXAM: MRI HEAD WITHOUT AND WITH CONTRAST TECHNIQUE: Multiplanar, multiecho pulse sequences of the brain and surrounding structures were obtained without and with intravenous contrast. CONTRAST:  19m GADAVIST GADOBUTROL 1 MMOL/ML IV SOLN COMPARISON:  Head CT April 26, 2019. FINDINGS: Brain: No acute infarction, hemorrhage, hydrocephalus, extra-axial collection or mass lesion. No focus of abnormal contrast enhancement to suggest metastatic disease  to the brain. Vascular: Normal flow voids. Skull and upper cervical spine: Normal marrow signal. Sinuses/Orbits: Mucous retention cyst in the right maxillary sinus. Bilateral lens surgery. Other: None. IMPRESSION: 1. No evidence of intracranial metastatic disease. 2. Unremarkable MRI of the brain. Electronically Signed   By: Pedro Earls M.D.   On: 12/15/2019 19:49   NM PET Image Restag (PS) Skull Base To Thigh  Result Date: 12/16/2019 CLINICAL DATA:  Initial treatment strategy for right upper lobe non-small cell lung cancer status post right upper lobectomy and adjuvant chemotherapy, with concern for right pleural recurrence on recent chest CT. EXAM: NUCLEAR MEDICINE PET SKULL BASE TO THIGH TECHNIQUE: 6.9 mCi F-18 FDG was injected intravenously. Full-ring PET imaging was performed from the skull base to thigh after the radiotracer. CT data was  obtained and used for attenuation correction and anatomic localization. Fasting blood glucose: 78 mg/dl COMPARISON:  11/29/2019 chest CT.  05/17/2019 PET-CT. FINDINGS: Mediastinal blood pool activity: SUV max 2.4 Liver activity: SUV max NA NECK: No hypermetabolic lymph nodes in the neck. Incidental CT findings: none CHEST: Widespread hypermetabolic right pleural soft tissue deposits with small right pleural effusion. Representative anterior inferior 1.6 cm right pleural lesion with max SUV 12.8 (series 4/image 102). Representative posteroinferior 1.1 cm right pleural lesion with max SUV 14.3 (series 4/image 105). Representative 1.9 cm posterior right mid pleural lesion with apparent invasion of the extrapleural fat with max SUV 15.6 (series 4/image 86). No hypermetabolic left pleural deposits. No hypermetabolic pulmonary findings. Subsolid posterior left upper lobe 1.8 cm and 0.7 cm pulmonary nodules demonstrate no appreciable FDG uptake (series 8/images 14 and 22, respectively). No enlarged or hypermetabolic axillary, mediastinal or hilar lymph nodes. Incidental CT findings: Coronary atherosclerosis. Atherosclerotic nonaneurysmal thoracic aorta. Postsurgical changes from right upper lobectomy. Severe centrilobular emphysema. ABDOMEN/PELVIS: No abnormal hypermetabolic activity within the liver, pancreas, adrenal glands, or spleen. No hypermetabolic lymph nodes in the abdomen or pelvis. Incidental CT findings: Simple 1.0 cm left liver cyst. Scattered granulomatous splenic calcifications. Simple bilateral renal cysts, largest 4.7 cm in the lower right kidney. Atherosclerotic nonaneurysmal abdominal aorta. Top-normal size prostate. Mild sigmoid diverticulosis. SKELETON: No focal hypermetabolic activity to suggest skeletal metastasis. Incidental CT findings: none IMPRESSION: 1. Widespread hypermetabolic right pleural soft tissue deposits with small right pleural effusion, compatible with right pleural metastatic  disease. 2. No additional sites of hypermetabolic metastatic disease. 3. Left upper lobe subsolid pulmonary nodules measuring up to 1.8 cm demonstrate no appreciable FDG uptake and can be reassessed on future follow-up chest CT studies. 4. Chronic findings include: Aortic Atherosclerosis (ICD10-I70.0) and Emphysema (ICD10-J43.9). Coronary atherosclerosis. Mild sigmoid diverticulosis. Electronically Signed   By: Ilona Sorrel M.D.   On: 12/16/2019 10:18   CT Biopsy  Result Date: 12/30/2019 INDICATION: 69 year old male with a history right sided lung carcinoma, prior treatment, now with FDG avid pleural mass EXAM: CT BIOPSY MEDICATIONS: None. ANESTHESIA/SEDATION: Moderate (conscious) sedation was employed during this procedure. A total of Versed 2.0 mg and Fentanyl 75 mcg was administered intravenously. Moderate Sedation Time: 13 minutes. The patient's level of consciousness and vital signs were monitored continuously by radiology nursing throughout the procedure under my direct supervision. FLUOROSCOPY TIME:  CT COMPLICATIONS: None PROCEDURE: The procedure, risks, benefits, and alternatives were explained to the patient and the patient's family. Specific risks that were addressed included bleeding, infection, pneumothorax, need for further procedure including chest tube placement, chance of delayed pneumothorax or hemorrhage, hemoptysis, nondiagnostic sample, cardiopulmonary collapse, death. Questions regarding the procedure  were encouraged and answered. The patient understands and consents to the procedure. Patient was positioned in the prone position on the CT gantry table and a scout CT of the chest was performed for planning purposes. Once angle of approach was determined, the skin and subcutaneous tissues this scan was prepped and draped in the usual sterile fashion, and a sterile drape was applied covering the operative field. A sterile gown and sterile gloves were used for the procedure. Local anesthesia  was provided with 1% Lidocaine. The skin and subcutaneous tissues were infiltrated 1% lidocaine for local anesthesia, and a small stab incision was made with an 11 blade scalpel. Using CT guidance, a 17 gauge trocar needle was advanced into the right posterior chest wall/pleuraltarget. After confirmation of the tip, separate 18 gauge core biopsies were performed. These were placed into solution for transportation to the lab. A final CT image was performed. Patient tolerated the procedure well and remained hemodynamically stable throughout. No complications were encountered and no significant blood loss was encounter IMPRESSION: Status post CT-guided biopsy right posterior pleural mass. Signed, Dulcy Fanny. Dellia Nims, RPVI [facility] Electronically Signed   By: Corrie Mckusick D.O.   On: 12/30/2019 09:45     ASSESSMENT/PLAN:  This is a very pleasant 69 year old Caucasian male initially diagnosed with stage IIb (T1b, N1, M0) non-small cell lung cancer, adenocarcinoma. He is status post a right upper lobe lobectomy with lymph node dissection under the care of Dr. Roxan Hockey. This was performed on June 17, 2019. He has no actionable mutations. His PD-L1 expression was estimated to be in the range of 50 to 100% by the molecular studies performed by the alchemist clinical trial.   The patient completed 3 of the 4 planned adjuvant chemotherapy cycles with cisplatin 75 mg per metered square and Alimta 500 mg/m. This was discontinued after cycle #3 due to intolerance.  In November 2021, the patients restaging scans showed concerning findings with disease recurrence small right pleural effusion as well as multiple pleural implants suspicious for metastatic disease. There was no evidence for metastatic disease to the brain.   He had repeat molecular studies by guardant 360 which did not show any actionable mutations. His tissue biopsy is still pending for  actionable mutations and repeat PDL1 expression  He had a pleural based biopsy of the suspicious lesions which was consistent with malignancy.    The patient as seen with Dr. Julien Nordmann today. Dr. Julien Nordmann had a lengthy discussion with the patient about his current condition and recommended treatment options. His options are palliative care vs hospice, single agent immunotherapy if he is negative for any actionable mutations and if his his PDL1 expression is greater than 50%. His PDL1 expression by the the alchemist clinical trial estimated that his PDL1 expression is between 50-100%. Repeat PDL1 expression from the recent pleural based biopsy is still pending. He had molecular studies performed x2 which was negative for any actionable mutations; however, his molecular studies from his recent biopsy is still pending. If he is a candidate then we will discuss that at his next appointment. Again, if negative for actionable mutations and PDL1 expression <50%, we will discuss chemotherapy with carboplatin, alimta, and keytruda.   We will wait until we have all the results before making a decision. We expect the results in 10-14 days. We will see him back at that time for a more detailed discussion about his recommended treatment based on the pending studies.  He will keep his appointment for a second opinion by Dr. Aniceto Boss.  The patient was advised to call immediately if he has any concerning symptoms in the interval. The patient voices understanding of current disease status and treatment options and is in agreement with the current care plan. All questions were answered. The patient knows to call the clinic with any problems, questions or concerns. We can certainly see the patient much sooner if necessary  No orders of the defined types were placed in this encounter.    Romano Stigger L Conchita Truxillo, PA-C 01/03/20  ADDENDUM: Hematology/Oncology Attending: I had a face-to-face encounter with the  patient today.  I recommended his care plan.  This is a very pleasant 69 years old white male with metastatic non-small cell lung cancer, adenocarcinoma that was initially diagnosed as stage IIb (T1b, N1, M0).  He was tested for molecular studies on the alchemist trial and he has negative for EGFR as well as ALK mutations. He had repeat biopsy of the pleural-based nodule and the final pathology was consistent with metastatic disease likely from the adenocarcinoma of the lung.  The molecular studies by guardant blood test was negative for actionable mutation.  We will also send the new tissue biopsy to guardant for molecular studies and PD-L1 expression.  PD-L1 expression was 75%.  The remaining molecular studies on the tissue are still pending. I discussed with the patient several options for management of his condition.  If he has an actionable mutation he will be treated with targeted therapy. If the patient has no actionable mutation he could be considered for treatment with immunotherapy with single agent pembrolizumab or Cemiplimab because of PD-L1 expression of 75%.  He also could benefit from a combination of systemic chemotherapy with immunotherapy but he is very reluctant about considering chemotherapy at this point.  He may consider changing his mind if absolutely needed. I will arrange for the patient to come back for follow-up visit in around 2 weeks for more detailed discussion of his treatment options based on the pending molecular studies. He was advised to call immediately if he has any other concerning symptoms in the interval. Disclaimer: This note was dictated with voice recognition software. Similar sounding words can inadvertently be transcribed and may be missed upon review. Eilleen Kempf, MD 01/03/20

## 2019-12-28 ENCOUNTER — Telehealth: Payer: Self-pay | Admitting: Medical Oncology

## 2019-12-28 ENCOUNTER — Other Ambulatory Visit: Payer: Self-pay | Admitting: Medical Oncology

## 2019-12-28 ENCOUNTER — Encounter: Payer: Self-pay | Admitting: *Deleted

## 2019-12-28 ENCOUNTER — Other Ambulatory Visit: Payer: Self-pay | Admitting: Radiology

## 2019-12-28 DIAGNOSIS — C3491 Malignant neoplasm of unspecified part of right bronchus or lung: Secondary | ICD-10-CM

## 2019-12-28 LAB — SARS CORONAVIRUS 2 (TAT 6-24 HRS): SARS Coronavirus 2: NEGATIVE

## 2019-12-28 NOTE — Telephone Encounter (Signed)
Faxed guardant 360 results .

## 2019-12-28 NOTE — Progress Notes (Signed)
I was contacted by [facility] coordinator today.  She is requesting information on moleculars.  Due to patient on study, I connected her with [facility] for assistance.

## 2019-12-29 ENCOUNTER — Telehealth: Payer: Self-pay | Admitting: Internal Medicine

## 2019-12-29 ENCOUNTER — Other Ambulatory Visit: Payer: Self-pay | Admitting: Student

## 2019-12-29 ENCOUNTER — Telehealth: Payer: Self-pay | Admitting: Radiology

## 2019-12-29 ENCOUNTER — Other Ambulatory Visit: Payer: Self-pay | Admitting: Radiology

## 2019-12-29 NOTE — Telephone Encounter (Signed)
Release: [id number] Faxed medical records to [facility]-Dr. Aniceto Boss @ [phone]

## 2019-12-29 NOTE — H&P (Signed)
Chief Complaint: Pleural mass  Referring Physician(s): Curt Bears  Supervising Physician: Corrie Mckusick  Patient Status: [facility] - Out-pt  History of Present Illness: Luis [name] is a 69 y.o. male with history of right upper lobe non-small cell lung cancer which was originally diagnosed in June 2021.  He is status post right upper lobectomy and adjuvant chemotherapy.  There is concern for right pleural recurrence on recent chest CT.  PET done 12/16/19 showed= Widespread hypermetabolic right pleural soft tissue deposits with small right pleural effusion, compatible with right pleural metastatic disease.  He is here today for image guided biopsy of the pleural mass.  He is NPO. No nausea/vomiting. No Fever/chills. He does c/o severe pain on the right side at the area where the mass is, otherwise ROS negative.   Past Medical History:  Diagnosis Date  . Arthritis    through out body  . Cancer ([place])   . Chronic bronchitis ([place])   . Concussion   . COPD (chronic obstructive pulmonary disease) ([place])   . Dyspnea   . Emphysema lung ([place])   . Frozen shoulder    LEFT  . GERD (gastroesophageal reflux disease)   . Hard of hearing   . Headache    alot of days, related to spine issues  . Hypertension   . Lumbar radiculopathy 2013  . Peyronie's disease   . Prostatitis 2011   Dr. Gaynelle Arabian  . RLS (restless legs syndrome)   . Shingles 06/13/2018   shingles on back, finished with prednisone, stills has scabs and pain  . Thigh pain    Bilateral Thigh  . Thigh pain 10/13   bilateral  . Tobacco abuse 2010   still uses electronic cig/ emphysema, SOB easily  . Wears partial dentures    upper    Past Surgical History:  Procedure Laterality Date  . CATARACT EXTRACTION W/PHACO Right 07/08/2018   Procedure: CATARACT EXTRACTION PHACO AND INTRAOCULAR LENS PLACEMENT ([place]) RIGHT;  Surgeon: Leandrew Koyanagi, MD;  Location: [place];  Service: Ophthalmology;   Laterality: Right;  . CATARACT EXTRACTION W/PHACO Left 07/29/2018   Procedure: CATARACT EXTRACTION PHACO AND INTRAOCULAR LENS PLACEMENT ([place]) LEFT TORIC LENS;  Surgeon: Leandrew Koyanagi, MD;  Location: [place];  Service: Ophthalmology;  Laterality: Left;  . CHEST TUBE INSERTION Right 07/01/2019   Procedure: CHEST TUBE INSERTION;  Surgeon: [name], MD;  Location: [place];  Service: Thoracic;  Laterality: Right;  . COLONOSCOPY    . EYE SURGERY    . INTERCOSTAL NERVE BLOCK Right 06/17/2019   Procedure: INTERCOSTAL NERVE BLOCK;  Surgeon: [name], MD;  Location: [place];  Service: Thoracic;  Laterality: Right;  . INTERCOSTAL NERVE BLOCK Right 07/01/2019   Procedure: INTERCOSTAL NERVE BLOCK;  Surgeon: [name], MD;  Location: [place];  Service: Thoracic;  Laterality: Right;  . TONSILLECTOMY    . VIDEO ASSISTED THORACOSCOPY Right 07/01/2019   Procedure: VIDEO ASSISTED THORACOSCOPY - REMOVAL OF RIGHT MIDDLE LOBE BLEBS;  Surgeon: [name], MD;  Location: [place];  Service: Thoracic;  Laterality: Right;  [name] VIDEO BRONCHOSCOPY WITH INSERTION OF INTERBRONCHIAL VALVE (IBV) Right 06/25/2019   Procedure: VIDEO BRONCHOSCOPY WITH INSERTION OF INTERBRONCHIAL VALVE (IBV); Size 7 IBV into Right Loer Lobe (RLL) Superior Segment, Size 9 IBV into RLL Basilar Segment;  Surgeon: [name], MD;  Location: [facility] OR;  Service: Thoracic;  Laterality: Right;  [name] VIDEO BRONCHOSCOPY WITH INSERTION OF INTERBRONCHIAL VALVE (IBV) N/A 08/06/2019  Procedure: VIDEO BRONCHOSCOPY WITH REMOVAL OF INTERBRONCHIAL VALVE (IBV);  Surgeon: [name], MD;  Location: [facility] OR;  Service: Thoracic;  Laterality: N/A;    Allergies: Ativan [lorazepam], Doxycycline, and Tramadol  Medications: Prior to Admission medications   Medication Sig Start Date End Date Taking? Authorizing Provider  acetaminophen (TYLENOL) 500 MG tablet Take 1,000 mg by mouth at bedtime as needed for  moderate pain.   Yes [provider]  Cholecalciferol (VITAMIN D) 125 MCG (5000 UT) CAPS Take 5,000 Units by mouth 3 (three) times a week.   Yes [provider]  Tiotropium Bromide-Olodaterol (STIOLTO RESPIMAT) 2.5-2.5 MCG/ACT AERS Inhale 2 puffs into the lungs daily. 05/06/19  Yes Tanda Rockers, MD  valsartan-hydrochlorothiazide (DIOVAN-HCT) 80-12.5 MG tablet Take 1 tablet by mouth daily.   Yes [provider]  oxyCODONE-acetaminophen (PERCOCET/ROXICET) 5-325 MG tablet Take 1 tablet by mouth every 6 (six) hours as needed for severe pain. Patient not taking: Reported on 12/28/2019 12/16/19   Heilingoetter, Cassandra L, PA-C     Family History  Problem Relation Age of Onset  . Diabetes Mother   . Hyperlipidemia Father   . Hypertension Father     Social History   Socioeconomic History  . Marital status: Married    Spouse name: Not on file  . Number of children: Not on file  . Years of education: 74  . Highest education level: Not on file  Occupational History  . Occupation: Social research officer, government - [employer]   Tobacco Use  . Smoking status: Former Smoker    Packs/day: 2.00    Years: 40.00    Pack years: 80.00    Types: Cigarettes    Quit date: 01/14/2009    Years since quitting: 10.9  . Smokeless tobacco: Former Network engineer  . Vaping Use: Former  . Start date: 01/15/1999  . Quit date: 06/17/2019  . Substances: Nicotine, Flavoring  Substance and Sexual Activity  . Alcohol use: Yes    Alcohol/week: 12.0 standard drinks    Types: 12 Cans of beer per week  . Drug use: No  . Sexual activity: Not on file  Other Topics Concern  . Not on file  Social History Narrative  . Not on file   Social Determinants of Health   Financial Resource Strain: Not on file  Food Insecurity: No Food Insecurity  . Worried About Charity fundraiser in the Last Year: Never true  . Ran Out of Food in the Last Year: Never true  Transportation Needs: No Transportation Needs  . Lack of  Transportation (Medical): No  . Lack of Transportation (Non-Medical): No  Physical Activity: Not on file  Stress: Not on file  Social Connections: Not on file     Review of Systems: A 12 point ROS discussed and pertinent positives are indicated in the HPI above.  All other systems are negative.  Review of Systems  Vital Signs: BP 133/83   Pulse 83   Temp 97.8 F (36.6 C) (Oral)   Resp 16   Ht 5\' 6"  (1.676 m)   Wt 61.2 kg   SpO2 99%   BMI 21.79 kg/m   Physical Exam Vitals reviewed.  Constitutional:      Appearance: Normal appearance.  HENT:     Head: Normocephalic and atraumatic.  Eyes:     Extraocular Movements: Extraocular movements intact.  Cardiovascular:     Rate and Rhythm: Normal rate and regular rhythm.  Pulmonary:     Effort: Pulmonary  effort is normal. No respiratory distress.     Breath sounds: Normal breath sounds.  Abdominal:     General: There is no distension.     Palpations: Abdomen is soft.     Tenderness: There is no abdominal tenderness.  Musculoskeletal:        General: Normal range of motion.     Cervical back: Normal range of motion.  Skin:    General: Skin is warm and dry.  Neurological:     General: No focal deficit present.     Mental Status: He is alert and oriented to person, place, and time.  Psychiatric:        Mood and Affect: Mood normal.        Behavior: Behavior normal.        Thought Content: Thought content normal.        Judgment: Judgment normal.     Imaging: MR BRAIN W WO CONTRAST  Result Date: 12/15/2019 CLINICAL DATA:  Non-small cell lung cancer.  Staging. EXAM: MRI HEAD WITHOUT AND WITH CONTRAST TECHNIQUE: Multiplanar, multiecho pulse sequences of the brain and surrounding structures were obtained without and with intravenous contrast. CONTRAST:  53mL GADAVIST GADOBUTROL 1 MMOL/ML IV SOLN COMPARISON:  Head CT April 26, 2019. FINDINGS: Brain: No acute infarction, hemorrhage, hydrocephalus, extra-axial collection or mass  lesion. No focus of abnormal contrast enhancement to suggest metastatic disease to the brain. Vascular: Normal flow voids. Skull and upper cervical spine: Normal marrow signal. Sinuses/Orbits: Mucous retention cyst in the right maxillary sinus. Bilateral lens surgery. Other: None. IMPRESSION: 1. No evidence of intracranial metastatic disease. 2. Unremarkable MRI of the brain. Electronically Signed   By: Pedro Earls M.D.   On: 12/15/2019 19:49   NM PET Image Restag (PS) Skull Base To Thigh  Result Date: 12/16/2019 CLINICAL DATA:  Initial treatment strategy for right upper lobe non-small cell lung cancer status post right upper lobectomy and adjuvant chemotherapy, with concern for right pleural recurrence on recent chest CT. EXAM: NUCLEAR MEDICINE PET SKULL BASE TO THIGH TECHNIQUE: 6.9 mCi F-18 FDG was injected intravenously. Full-ring PET imaging was performed from the skull base to thigh after the radiotracer. CT data was obtained and used for attenuation correction and anatomic localization. Fasting blood glucose: 78 mg/dl COMPARISON:  11/29/2019 chest CT.  05/17/2019 PET-CT. FINDINGS: Mediastinal blood pool activity: SUV max 2.4 Liver activity: SUV max NA NECK: No hypermetabolic lymph nodes in the neck. Incidental CT findings: none CHEST: Widespread hypermetabolic right pleural soft tissue deposits with small right pleural effusion. Representative anterior inferior 1.6 cm right pleural lesion with max SUV 12.8 (series 4/image 102). Representative posteroinferior 1.1 cm right pleural lesion with max SUV 14.3 (series 4/image 105). Representative 1.9 cm posterior right mid pleural lesion with apparent invasion of the extrapleural fat with max SUV 15.6 (series 4/image 86). No hypermetabolic left pleural deposits. No hypermetabolic pulmonary findings. Subsolid posterior left upper lobe 1.8 cm and 0.7 cm pulmonary nodules demonstrate no appreciable FDG uptake (series 8/images 14 and 22,  respectively). No enlarged or hypermetabolic axillary, mediastinal or hilar lymph nodes. Incidental CT findings: Coronary atherosclerosis. Atherosclerotic nonaneurysmal thoracic aorta. Postsurgical changes from right upper lobectomy. Severe centrilobular emphysema. ABDOMEN/PELVIS: No abnormal hypermetabolic activity within the liver, pancreas, adrenal glands, or spleen. No hypermetabolic lymph nodes in the abdomen or pelvis. Incidental CT findings: Simple 1.0 cm left liver cyst. Scattered granulomatous splenic calcifications. Simple bilateral renal cysts, largest 4.7 cm in the lower right kidney. Atherosclerotic nonaneurysmal abdominal  aorta. Top-normal size prostate. Mild sigmoid diverticulosis. SKELETON: No focal hypermetabolic activity to suggest skeletal metastasis. Incidental CT findings: none IMPRESSION: 1. Widespread hypermetabolic right pleural soft tissue deposits with small right pleural effusion, compatible with right pleural metastatic disease. 2. No additional sites of hypermetabolic metastatic disease. 3. Left upper lobe subsolid pulmonary nodules measuring up to 1.8 cm demonstrate no appreciable FDG uptake and can be reassessed on future follow-up chest CT studies. 4. Chronic findings include: Aortic Atherosclerosis (ICD10-I70.0) and Emphysema (ICD10-J43.9). Coronary atherosclerosis. Mild sigmoid diverticulosis. Electronically Signed   By: Ilona Sorrel M.D.   On: 12/16/2019 10:18    Labs:  CBC: Recent Labs    10/14/19 0838 10/28/19 1033 11/04/19 0852 12/30/19 0630  WBC 3.7* 3.6* 3.5* 7.0  HGB 12.9* 11.4* 11.9* 12.8*  HCT 38.6* 33.1* 34.6* 37.4*  PLT 521* 150 426* 302    COAGS: Recent Labs    06/15/19 1341 07/01/19 0215 08/04/19 1417 12/30/19 0630  INR 1.0 1.0 0.9 0.9  APTT 32 38* 31  --     BMP: Recent Labs    09/30/19 1031 10/07/19 1007 10/08/19 0952 10/14/19 0838 10/28/19 1033 11/04/19 0852  NA 133* 135 133* 132* 135 134*  K 3.9 5.8* 5.0 4.0 4.9 4.4  CL 96*  101 95* 95* 97* 97*  CO2 26 30 32 26 33* 21*  GLUCOSE 82 113* 76 228* 137* 203*  BUN 21 17 15 16 12 15   CALCIUM 8.8* 9.6 9.8 9.4 9.5 9.7  CREATININE 1.16 0.93 0.89 1.14 1.08 1.18  GFRNONAA >60 >60 >60 >60 >60 >60  GFRAA >60 >60 >60 >60  --   --     LIVER FUNCTION TESTS: Recent Labs    10/08/19 0952 10/14/19 0838 10/28/19 1033 11/04/19 0852  BILITOT 0.4 0.3 0.4 0.3  AST 10* 19 7* 7*  ALT 11 74* <6 <6  ALKPHOS 82 181* 84 92  PROT 7.0 7.0 6.4* 7.0  ALBUMIN 3.5 3.4* 3.1* 3.5    TUMOR MARKERS: No results for input(s): AFPTM, CEA, CA199, CHROMGRNA in the last 8760 hours.  Assessment and Plan:  Non-small cell lung cancer, invasive poorly differentiated carcinoma diagnosed in June 2021.  Widespread hypermetabolic right pleural soft tissue deposits with small right pleural effusion, compatible with right pleural metastatic disease.  Will proceed with image guided biopsy today by Dr. Earleen Newport.  Risks and benefits of CT guided lung nodule biopsy was discussed with the patient including, but not limited to bleeding, hemoptysis, respiratory failure requiring intubation, infection, pneumothorax requiring chest tube placement, stroke from air embolism or even death.  All of the patient's questions were answered and the patient is agreeable to proceed.  Consent signed and in chart.  Thank you for this interesting consult.  I greatly enjoyed meeting Luis [name] and look forward to participating in their care.  A copy of this report was sent to the requesting provider on this date.  Electronically Signed: Murrell Redden, PA-C   12/30/2019, 7:24 AM      I spent a total of  30 Minutes  in face to face in clinical consultation, greater than 50% of which was counseling/coordinating care for pleural mass biopsy.

## 2019-12-29 NOTE — Telephone Encounter (Signed)
ALCHEMIST [id number]: ADJUVANT LUNG CANCER ENRICHMENT MARKER IDENTIFICATION AND SEQUENCING TRIAL  12/29/2019    11:13AM  E-MAIL: Messaged study to confirm it was okay to send ALCHEMIST results to another institution as long as patient verified their consent as well. Study director stated okay to release this information.   PHONE CALL: Confirmed it was okay to send ALCHEMIST results to [facility]. Gave information concerning that sharing the results to [facility] was not in consent that was signed and this clinical research coordinator wanted to verify it was okay to send results. Results will be faxed to number given by Norton Blizzard, RN, Nurse Navigator.   Carol Ada, RT(R)(T) Clinical Research Coordinator

## 2019-12-30 ENCOUNTER — Other Ambulatory Visit: Payer: Self-pay

## 2019-12-30 ENCOUNTER — Ambulatory Visit (HOSPITAL_COMMUNITY)
Admission: RE | Admit: 2019-12-30 | Discharge: 2019-12-30 | Disposition: A | Discharge status: Home / Self Care | Payer: Medicare Other | Source: Ambulatory Visit | Attending: Internal Medicine | Admitting: Internal Medicine

## 2019-12-30 ENCOUNTER — Encounter (HOSPITAL_COMMUNITY): Payer: Self-pay

## 2019-12-30 DIAGNOSIS — C3491 Malignant neoplasm of unspecified part of right bronchus or lung: Secondary | ICD-10-CM

## 2019-12-30 DIAGNOSIS — J90 Pleural effusion, not elsewhere classified: Secondary | ICD-10-CM | POA: Diagnosis not present

## 2019-12-30 DIAGNOSIS — R918 Other nonspecific abnormal finding of lung field: Secondary | ICD-10-CM | POA: Diagnosis not present

## 2019-12-30 DIAGNOSIS — Z87891 Personal history of nicotine dependence: Secondary | ICD-10-CM | POA: Insufficient documentation

## 2019-12-30 DIAGNOSIS — J948 Other specified pleural conditions: Secondary | ICD-10-CM | POA: Diagnosis not present

## 2019-12-30 LAB — BASIC METABOLIC PANEL
Anion gap: 12 (ref 5–15)
BUN: 15 mg/dL (ref 8–23)
CO2: 25 mmol/L (ref 22–32)
Calcium: 9.5 mg/dL (ref 8.9–10.3)
Chloride: 101 mmol/L (ref 98–111)
Creatinine, Ser: 1.17 mg/dL (ref 0.61–1.24)
GFR, Estimated: >60 mL/min (ref >60)
Glucose, Bld: 96 mg/dL (ref 70–99)
Potassium: 4 mmol/L (ref 3.5–5.1)
Sodium: 138 mmol/L (ref 135–145)

## 2019-12-30 LAB — CBC WITH DIFFERENTIAL/PLATELET
Abs Immature Granulocytes: 0.05 10*3/uL (ref 0.00–0.07)
Basophils Absolute: 0.1 10*3/uL (ref 0.0–0.1)
Basophils Relative: 1 %
Eosinophils Absolute: 0.1 10*3/uL (ref 0.0–0.5)
Eosinophils Relative: 2 %
HCT: 37.4 % — ABNORMAL LOW (ref 39.0–52.0)
Hemoglobin: 12.8 g/dL — ABNORMAL LOW (ref 13.0–17.0)
Immature Granulocytes: 1 %
Lymphocytes Relative: 26 %
Lymphs Abs: 1.8 10*3/uL (ref 0.7–4.0)
MCH: 30 pg (ref 26.0–34.0)
MCHC: 34.2 g/dL (ref 30.0–36.0)
MCV: 87.6 fL (ref 80.0–100.0)
Monocytes Absolute: 0.6 10*3/uL (ref 0.1–1.0)
Monocytes Relative: 9 %
Neutro Abs: 4.3 10*3/uL (ref 1.7–7.7)
Neutrophils Relative %: 61 %
Platelets: 302 10*3/uL (ref 150–400)
RBC: 4.27 MIL/uL (ref 4.22–5.81)
RDW: 12.2 % (ref 11.5–15.5)
WBC: 7 10*3/uL (ref 4.0–10.5)
nRBC: 0 % (ref 0.0–0.2)

## 2019-12-30 LAB — PROTIME-INR
INR: 0.9 (ref 0.8–1.2)
Prothrombin Time: 11.8 seconds (ref 11.4–15.2)

## 2019-12-30 MED ORDER — SODIUM CHLORIDE 0.9 % IV SOLN: INTRAVENOUS | Status: DC

## 2019-12-30 MED ORDER — FENTANYL CITRATE (PF) 100 MCG/2ML IJ SOLN
INTRAMUSCULAR | Status: AC
  Filled 2019-12-30: qty 2

## 2019-12-30 MED ORDER — MIDAZOLAM HCL 2 MG/2ML IJ SOLN
INTRAMUSCULAR | Status: AC | PRN
  Administered 2019-12-30 (×2): 1 mg via INTRAVENOUS

## 2019-12-30 MED ORDER — LIDOCAINE HCL 1 % IJ SOLN
INTRAMUSCULAR | Status: AC
  Filled 2019-12-30: qty 20

## 2019-12-30 MED ORDER — FENTANYL CITRATE (PF) 100 MCG/2ML IJ SOLN
INTRAMUSCULAR | Status: AC | PRN
  Administered 2019-12-30: 25 ug via INTRAVENOUS
  Administered 2019-12-30: 50 ug via INTRAVENOUS

## 2019-12-30 MED ORDER — MIDAZOLAM HCL 2 MG/2ML IJ SOLN
INTRAMUSCULAR | Status: AC
  Filled 2019-12-30: qty 2

## 2019-12-30 NOTE — Discharge Instructions (Signed)
Lung Biopsy, Care After This sheet gives you information about how to care for yourself after your procedure. Your health care provider may also give you more specific instructions depending on the type of biopsy you had. If you have problems or questions, contact your health care provider. What can I expect after the procedure? After the procedure, it is common to have:  A cough.  A sore throat.  Pain where a needle, bronchoscope, or incision was used to collect a biopsy sample (biopsy site). Follow these instructions at home: Medicines  Take over-the-counter and prescription medicines only as told by your health care provider.  Do not drink alcohol if your health care provider tells you not to drink.  Ask your health care provider if the medicine prescribed to you: ? Requires you to avoid driving or using heavy machinery. ? Can cause constipation. You may need to take these actions to prevent or treat constipation:  Drink enough fluid to keep your urine pale yellow.  Take over-the-counter or prescription medicines.  Eat foods that are high in fiber, such as beans, whole grains, and fresh fruits and vegetables.  Limit foods that are high in fat and processed sugars, such as fried or sweet foods.  Do not drive for 24 hours if you were given a sedative. Biopsy site care   Follow instructions from your health care provider about how to take care of your biopsy site. Make sure you: ? Wash your hands with soap and water before and after you change your bandage (dressing). If soap and water are not available, use hand sanitizer. ? Change your dressing as told by your health care provider. ? Leave stitches (sutures), skin glue, or adhesive strips in place. These skin closures may need to stay in place for 2 weeks or longer. If adhesive strip edges start to loosen and curl up, you may trim the loose edges. Do not remove adhesive strips completely unless your health care provider tells  you to do that.  Do not take baths, swim, or use a hot tub until your health care provider approves. Ask your health care provider if you may take showers. You may only be allowed to take sponge baths.  Check your biopsy site every day for signs of infection. Check for: ? Redness, swelling, or more pain. ? Fluid or blood. ? Warmth. ? Pus or a bad smell. General instructions  Return to your normal activities as told by your health care provider. Ask your health care provider what activities are safe for you.  It is up to you to get the results of your procedure. Ask your health care provider, or the department that is doing the procedure, when your results will be ready.  Keep all follow-up visits as told by your health care provider. This is important. Contact a health care provider if:  You have a fever.  You have redness, swelling, or more pain around your biopsy site.  You have fluid or blood coming from your biopsy site.  Your biopsy site feels warm to the touch.  You have pus or a bad smell coming from your biopsy site.  You have pain that does not get better with medicine. Get help right away if:  You cough up blood.  You have trouble breathing.  You have chest pain.  You lose consciousness. Summary  After the procedure, it is common to have a sore throat and a cough.  Return to your normal activities as told by your  health care provider. Ask your health care provider what activities are safe for you.  Take over-the-counter and prescription medicines only as told by your health care provider.  Report any unusual symptoms to your health care provider. This information is not intended to replace advice given to you by your health care provider. Make sure you discuss any questions you have with your health care provider. Document Revised: 02/04/2018 Document Reviewed: 01/30/2016 Elsevier Patient Education  [place]. Moderate Conscious Sedation,  Adult Sedation is the use of medicines to promote relaxation and relieve discomfort and anxiety. Moderate conscious sedation is a type of sedation. Under moderate conscious sedation, you are less alert than normal, but you are still able to respond to instructions, touch, or both. Moderate conscious sedation is used during short medical and dental procedures. It is milder than deep sedation, which is a type of sedation under which you cannot be easily woken up. It is also milder than general anesthesia, which is the use of medicines to make you unconscious. Moderate conscious sedation allows you to return to your regular activities sooner. Tell a health care provider about:  Any allergies you have.  All medicines you are taking, including vitamins, herbs, eye drops, creams, and over-the-counter medicines.  Use of steroids (by mouth or creams).  Any problems you or family members have had with sedatives and anesthetic medicines.  Any blood disorders you have.  Any surgeries you have had.  Any medical conditions you have, such as sleep apnea.  Whether you are pregnant or may be pregnant.  Any use of cigarettes, alcohol, marijuana, or street drugs. What are the risks? Generally, this is a safe procedure. However, problems may occur, including:  Getting too much medicine (oversedation).  Nausea.  Allergic reaction to medicines.  Trouble breathing. If this happens, a breathing tube may be used to help with breathing. It will be removed when you are awake and breathing on your own.  Heart trouble.  Lung trouble. What happens before the procedure? Staying hydrated Follow instructions from your health care provider about hydration, which may include:  Up to 2 hours before the procedure - you may continue to drink clear liquids, such as water, clear fruit juice, black coffee, and plain tea. Eating and drinking restrictions Follow instructions from your health care provider about  eating and drinking, which may include:  8 hours before the procedure - stop eating heavy meals or foods such as meat, fried foods, or fatty foods.  6 hours before the procedure - stop eating light meals or foods, such as toast or cereal.  6 hours before the procedure - stop drinking milk or drinks that contain milk.  2 hours before the procedure - stop drinking clear liquids. Medicine Ask your health care provider about:  Changing or stopping your regular medicines. This is especially important if you are taking diabetes medicines or blood thinners.  Taking medicines such as aspirin and ibuprofen. These medicines can thin your blood. Do not take these medicines before your procedure if your health care provider instructs you not to.  Tests and exams  You will have a physical exam.  You may have blood tests done to show: ? How well your kidneys and liver are working. ? How well your blood can clot. General instructions  Plan to have someone take you home from the hospital or clinic.  If you will be going home right after the procedure, plan to have someone with you for 24 hours.  What happens during the procedure?  An IV tube will be inserted into one of your veins.  Medicine to help you relax (sedative) will be given through the IV tube.  The medical or dental procedure will be performed. What happens after the procedure?  Your blood pressure, heart rate, breathing rate, and blood oxygen level will be monitored often until the medicines you were given have worn off.  Do not drive for 24 hours. This information is not intended to replace advice given to you by your health care provider. Make sure you discuss any questions you have with your health care provider. Document Revised: 12/13/2016 Document Reviewed: 04/22/2015 Elsevier Patient Education  2020 [employer].

## 2019-12-30 NOTE — Procedures (Signed)
Interventional Radiology Procedure Note  Procedure: CT guided biopsy of right posterior pleural mass   Complications: None Recommendations: - Bedrest 1 hr - Follow up path - 1 hr dc home  Signed,  Corrie Mckusick, DO

## 2019-12-30 NOTE — Progress Notes (Signed)
Patient was given discharge instructions. He verbalized understanding. 

## 2020-01-03 ENCOUNTER — Telehealth: Payer: Self-pay | Admitting: Internal Medicine

## 2020-01-03 ENCOUNTER — Other Ambulatory Visit: Payer: Self-pay

## 2020-01-03 ENCOUNTER — Inpatient Hospital Stay (HOSPITAL_BASED_OUTPATIENT_CLINIC_OR_DEPARTMENT_OTHER): Payer: Medicare Other | Admitting: Physician Assistant

## 2020-01-03 VITALS — BP 130/74 | HR 103 | Temp 96.7°F | Resp 17 | Ht 66.0 in | Wt 140.2 lb

## 2020-01-03 DIAGNOSIS — G893 Neoplasm related pain (acute) (chronic): Secondary | ICD-10-CM | POA: Diagnosis not present

## 2020-01-03 DIAGNOSIS — Z7189 Other specified counseling: Secondary | ICD-10-CM

## 2020-01-03 DIAGNOSIS — J449 Chronic obstructive pulmonary disease, unspecified: Secondary | ICD-10-CM | POA: Diagnosis not present

## 2020-01-03 DIAGNOSIS — K219 Gastro-esophageal reflux disease without esophagitis: Secondary | ICD-10-CM | POA: Diagnosis not present

## 2020-01-03 DIAGNOSIS — Z9221 Personal history of antineoplastic chemotherapy: Secondary | ICD-10-CM | POA: Diagnosis not present

## 2020-01-03 DIAGNOSIS — C3491 Malignant neoplasm of unspecified part of right bronchus or lung: Secondary | ICD-10-CM | POA: Diagnosis not present

## 2020-01-03 DIAGNOSIS — Z902 Acquired absence of lung [part of]: Secondary | ICD-10-CM | POA: Diagnosis not present

## 2020-01-03 DIAGNOSIS — C3411 Malignant neoplasm of upper lobe, right bronchus or lung: Secondary | ICD-10-CM | POA: Diagnosis not present

## 2020-01-03 LAB — SURGICAL PATHOLOGY

## 2020-01-03 NOTE — Telephone Encounter (Signed)
Scheduled appointment per 12/20 los. Spoke to patient who is aware of appointment date and time.  

## 2020-01-06 ENCOUNTER — Encounter (HOSPITAL_COMMUNITY): Payer: Self-pay

## 2020-01-17 ENCOUNTER — Encounter: Payer: Self-pay | Admitting: Internal Medicine

## 2020-01-17 ENCOUNTER — Other Ambulatory Visit: Payer: Self-pay

## 2020-01-17 ENCOUNTER — Inpatient Hospital Stay: Payer: Medicare Other | Attending: Internal Medicine | Admitting: Internal Medicine

## 2020-01-17 VITALS — BP 141/74 | HR 88 | Temp 97.6°F | Resp 16 | Ht 66.0 in | Wt 140.7 lb

## 2020-01-17 DIAGNOSIS — M1990 Unspecified osteoarthritis, unspecified site: Secondary | ICD-10-CM | POA: Diagnosis not present

## 2020-01-17 DIAGNOSIS — J449 Chronic obstructive pulmonary disease, unspecified: Secondary | ICD-10-CM | POA: Diagnosis not present

## 2020-01-17 DIAGNOSIS — R5382 Chronic fatigue, unspecified: Secondary | ICD-10-CM | POA: Diagnosis not present

## 2020-01-17 DIAGNOSIS — Z902 Acquired absence of lung [part of]: Secondary | ICD-10-CM | POA: Diagnosis not present

## 2020-01-17 DIAGNOSIS — Z79899 Other long term (current) drug therapy: Secondary | ICD-10-CM | POA: Diagnosis not present

## 2020-01-17 DIAGNOSIS — I10 Essential (primary) hypertension: Secondary | ICD-10-CM | POA: Diagnosis not present

## 2020-01-17 DIAGNOSIS — K219 Gastro-esophageal reflux disease without esophagitis: Secondary | ICD-10-CM | POA: Insufficient documentation

## 2020-01-17 DIAGNOSIS — C3411 Malignant neoplasm of upper lobe, right bronchus or lung: Secondary | ICD-10-CM | POA: Insufficient documentation

## 2020-01-17 DIAGNOSIS — Z5112 Encounter for antineoplastic immunotherapy: Secondary | ICD-10-CM | POA: Diagnosis not present

## 2020-01-17 DIAGNOSIS — C3491 Malignant neoplasm of unspecified part of right bronchus or lung: Secondary | ICD-10-CM | POA: Diagnosis not present

## 2020-01-17 DIAGNOSIS — Z9221 Personal history of antineoplastic chemotherapy: Secondary | ICD-10-CM | POA: Insufficient documentation

## 2020-01-17 NOTE — Progress Notes (Signed)
DISCONTINUE ON PATHWAY REGIMEN - Non-Small Cell Lung     A cycle is every 21 days:     Pemetrexed      Cisplatin   **Always confirm dose/schedule in your pharmacy ordering system**  REASON: Disease Progression PRIOR TREATMENT: [id number]: Cisplatin + Pemetrexed q3 Weeks x 4 Cycles TREATMENT RESPONSE: Progressive Disease (PD)  START OFF PATHWAY REGIMEN - Non-Small Cell Lung   [id number]:Cemiplimab 350 mg IV D1 q21 Days:   A cycle is every 21 days:     Cemiplimab-rwlc   **Always confirm dose/schedule in your pharmacy ordering system**  Patient Characteristics: Stage IV Metastatic, Nonsquamous, Initial Chemotherapy/Immunotherapy, PS = 0, 1, ALK Rearrangement Negative and ROS1 Rearrangement Negative and NTRK Gene Fusion?Negative and RET Gene Fusion?Negative and EGFR Mutation Negative, PD-L1 Expression Positive   ? 50% (TPS) and Immunotherapy Candidate Therapeutic Status: Stage IV Metastatic Histology: Nonsquamous Cell ROS1 Rearrangement Status: Negative Other Mutations/Biomarkers: No Other Actionable Mutations Chemotherapy/Immunotherapy LOT: Initial Chemotherapy/Immunotherapy Molecular Targeted Therapy: Not Appropriate KRAS G12C Mutation Status: Negative MET Exon 14 Mutation Status: Negative RET Gene Fusion Status: Negative EGFR Mutation Status: Negative/Wild Type NTRK Gene Fusion Status: Negative PD-L1 Expression Status: PD-L1 Positive ? 50% (TPS) ALK Rearrangement Status: Negative BRAF V600E Mutation Status: Negative ECOG Performance Status: 1 Biomarker Assessment Status Confirmation: All Genomic Markers Negative, or Only MET+ or BRAF+ or KRAS G12C+ Immunotherapy Candidate Status: Candidate for Immunotherapy Intent of Therapy: Non-Curative / Palliative Intent, Discussed with Patient

## 2020-01-17 NOTE — Progress Notes (Signed)
[facility] [phone]   [phone]  OFFICE PROGRESS NOTE  [name], MD [address] [place] [address]  DIAGNOSIS: Metastatic non-small cell lung cancer initially diagnosed as stage IIB (T1b, N1, M0) non-small cell lung cancer, invasive poorly differentiated carcinoma diagnosed in June 2021 with disease recurrence in November 2021 Molecular studies are negative for EGFR mutation as well as ALK gene translocation.  PD-L1 expression was 50-100%.  This is based on the report from the [facility] trial. Molecular studies by [facility]: Showed no actionable mutations and PD-L1 expression was 75%  PRIOR THERAPY:  1) Status post right upper lobectomy with lymph node dissection on June 17, 2019 under the care of Dr. Roxan Hockey. 2) Adjuvant systemic chemotherapy with cisplatin 75 mg/M2 and Alimta 500 mg/M2 every 3 weeks.  First dose September 02, 2019. Status post 3 cycles.  His treatment was discontinued secondary to intolerance.  CURRENT THERAPY: None.  INTERVAL HISTORY: Luis [name] 70 y.o. male returns to the clinic today for follow-up visit accompanied by his wife.  The patient is feeling fine today with no concerning complaints.  He denied having any current chest pain, shortness of breath, cough or hemoptysis.  He denied having any fever or chills.  He has no nausea, vomiting, diarrhea or constipation.  He has no headache or visual changes.  He denied having any recent weight loss or night sweats.  He had molecular studies performed by [facility] and the blood test showed no actionable mutation.  His PD-L1 expression was 75%.  The patient is here today for evaluation and discussion of his treatment options based on the new findings.   MEDICAL HISTORY: Past Medical History:  Diagnosis Date  . Arthritis    through out body  . Cancer ([address])   . Chronic bronchitis ([place])   . Concussion   . COPD (chronic obstructive pulmonary  disease) ([place])   . Dyspnea   . Emphysema lung ([place])   . Frozen shoulder    LEFT  . GERD (gastroesophageal reflux disease)   . Hard of hearing   . Headache    alot of days, related to spine issues  . Hypertension   . Lumbar radiculopathy 2013  . Peyronie's disease   . Prostatitis 2011   Dr. Gaynelle Arabian  . RLS (restless legs syndrome)   . Shingles 06/13/2018   shingles on back, finished with prednisone, stills has scabs and pain  . Thigh pain    Bilateral Thigh  . Thigh pain 10/13   bilateral  . Tobacco abuse 2010   still uses electronic cig/ emphysema, SOB easily  . Wears partial dentures    upper    ALLERGIES:  is allergic to ativan [lorazepam], doxycycline, and tramadol.  MEDICATIONS:  Current Outpatient Medications  Medication Sig Dispense Refill  . acetaminophen (TYLENOL) 500 MG tablet Take 1,000 mg by mouth at bedtime as needed for moderate pain.    . Cholecalciferol (VITAMIN D) 125 MCG (5000 UT) CAPS Take 5,000 Units by mouth 3 (three) times a week.    . citalopram (CELEXA) 20 MG tablet Take 1 tablet by mouth daily.    [name] gabapentin (NEURONTIN) 100 MG capsule 1-2 capsules    . guaiFENesin (MUCINEX) 600 MG 12 hr tablet 1 tablet as needed    . oxyCODONE-acetaminophen (PERCOCET/ROXICET) 5-325 MG tablet Take 1 tablet by mouth every 6 (six) hours as needed for severe pain. (Patient not taking: Reported on  12/28/2019) 30 tablet 0  . pantoprazole (PROTONIX) 20 MG tablet 1 tablet    . Tiotropium Bromide-Olodaterol (STIOLTO RESPIMAT) 2.5-2.5 MCG/ACT AERS Inhale 2 puffs into the lungs daily. 4 g 11  . valsartan-hydrochlorothiazide (DIOVAN-HCT) 80-12.5 MG tablet Take 1 tablet by mouth daily.    . vitamin B-12 (CYANOCOBALAMIN) 500 MCG tablet 1 tablet     No current facility-administered medications for this visit.    SURGICAL HISTORY:  Past Surgical History:  Procedure Laterality Date  . CATARACT EXTRACTION W/PHACO Right 07/08/2018   Procedure: CATARACT EXTRACTION PHACO AND  INTRAOCULAR LENS PLACEMENT ([place]) RIGHT;  Surgeon: Leandrew Koyanagi, MD;  Location: [facility];  Service: Ophthalmology;  Laterality: Right;  . CATARACT EXTRACTION W/PHACO Left 07/29/2018   Procedure: CATARACT EXTRACTION PHACO AND INTRAOCULAR LENS PLACEMENT ([place]) LEFT TORIC LENS;  Surgeon: Leandrew Koyanagi, MD;  Location: [facility];  Service: Ophthalmology;  Laterality: Left;  . CHEST TUBE INSERTION Right 07/01/2019   Procedure: CHEST TUBE INSERTION;  Surgeon: [name], MD;  Location: [facility];  Service: Thoracic;  Laterality: Right;  . COLONOSCOPY    . EYE SURGERY    . INTERCOSTAL NERVE BLOCK Right 06/17/2019   Procedure: INTERCOSTAL NERVE BLOCK;  Surgeon: [name], MD;  Location: [facility];  Service: Thoracic;  Laterality: Right;  . INTERCOSTAL NERVE BLOCK Right 07/01/2019   Procedure: INTERCOSTAL NERVE BLOCK;  Surgeon: [name], MD;  Location: [facility];  Service: Thoracic;  Laterality: Right;  . TONSILLECTOMY    . VIDEO ASSISTED THORACOSCOPY Right 07/01/2019   Procedure: VIDEO ASSISTED THORACOSCOPY - REMOVAL OF RIGHT MIDDLE LOBE BLEBS;  Surgeon: [name], MD;  Location: [facility];  Service: Thoracic;  Laterality: Right;  [name] VIDEO BRONCHOSCOPY WITH INSERTION OF INTERBRONCHIAL VALVE (IBV) Right 06/25/2019   Procedure: VIDEO BRONCHOSCOPY WITH INSERTION OF INTERBRONCHIAL VALVE (IBV); Size 7 IBV into Right Loer Lobe (RLL) Superior Segment, Size 9 IBV into RLL Basilar Segment;  Surgeon: [name], MD;  Location: [facility];  Service: Thoracic;  Laterality: Right;  [name] VIDEO BRONCHOSCOPY WITH INSERTION OF INTERBRONCHIAL VALVE (IBV) N/A 08/06/2019   Procedure: VIDEO BRONCHOSCOPY WITH REMOVAL OF INTERBRONCHIAL VALVE (IBV);  Surgeon: [name], MD;  [facility];  Service: Thoracic;  Laterality: N/A;    REVIEW OF SYSTEMS:  Constitutional: negative Eyes: negative Ears, nose, mouth, throat, and face: negative Respiratory:  positive for pleurisy/chest pain Cardiovascular: negative Gastrointestinal: negative Genitourinary:negative Integument/breast: negative Hematologic/lymphatic: negative Musculoskeletal:negative Neurological: positive for dizziness Behavioral/Psych: negative Endocrine: negative Allergic/Immunologic: negative   PHYSICAL EXAMINATION: General appearance: alert, cooperative, fatigued and no distress Head: Normocephalic, without obvious abnormality, atraumatic Neck: no adenopathy, no JVD, supple, symmetrical, trachea midline and thyroid not enlarged, symmetric, no tenderness/mass/nodules Lymph nodes: Cervical, supraclavicular, and axillary nodes normal. Resp: clear to auscultation bilaterally Back: symmetric, no curvature. ROM normal. No CVA tenderness. Cardio: regular rate and rhythm, S1, S2 normal, no murmur, click, rub or gallop GI: soft, non-tender; bowel sounds normal; no masses,  no organomegaly Extremities: extremities normal, atraumatic, no cyanosis or edema Neurologic: Alert and oriented X 3, normal strength and tone. Normal symmetric reflexes. Normal coordination and gait  ECOG PERFORMANCE STATUS: 1 - Symptomatic but completely ambulatory  Blood pressure (!) 141/74, pulse 88, temperature 97.6 F (36.4 C), temperature source Tympanic, resp. rate 16, height _0  (1.676 m), weight 140 lb 11.2 oz (63.8 kg), SpO2 98 %.  LABORATORY DATA: Lab Results  Component Value Date   WBC 7.0 12/30/2019   HGB  12.8 (L) 01/01/2020   HCT 37.4 (L) 2020-01-01   MCV 87.6 01-01-2020   PLT 302 2020-01-01      Chemistry      Component Value Date/Time   NA 138 2020/01/01 0630   K 4.0 01-Jan-2020 0630   CL 101 2020/01/01 0630   CO2 25 2020/01/01 0630   BUN 15 2020-01-01 0630   CREATININE 1.17 2020-01-01 0630   CREATININE 1.18 11/04/2019 0852      Component Value Date/Time   CALCIUM 9.5 01/01/20 0630   ALKPHOS 92 11/04/2019 0852   AST 7 (L) 11/04/2019 0852   ALT <6 11/04/2019 0852    BILITOT 0.3 11/04/2019 0852       RADIOGRAPHIC STUDIES: CT Biopsy  Result Date: 01-01-20 INDICATION: 70 year old male with a history right sided lung carcinoma, prior treatment, now with FDG avid pleural mass EXAM: CT BIOPSY MEDICATIONS: None. ANESTHESIA/SEDATION: Moderate (conscious) sedation was employed during this procedure. A total of Versed 2.0 mg and Fentanyl 75 mcg was administered intravenously. Moderate Sedation Time: 13 minutes. The patient's level of consciousness and vital signs were monitored continuously by radiology nursing throughout the procedure under my direct supervision. FLUOROSCOPY TIME:  CT COMPLICATIONS: None PROCEDURE: The procedure, risks, benefits, and alternatives were explained to the patient and the patient's family. Specific risks that were addressed included bleeding, infection, pneumothorax, need for further procedure including chest tube placement, chance of delayed pneumothorax or hemorrhage, hemoptysis, nondiagnostic sample, cardiopulmonary collapse, death. Questions regarding the procedure were encouraged and answered. The patient understands and consents to the procedure. Patient was positioned in the prone position on the CT gantry table and a scout CT of the chest was performed for planning purposes. Once angle of approach was determined, the skin and subcutaneous tissues this scan was prepped and draped in the usual sterile fashion, and a sterile drape was applied covering the operative field. A sterile gown and sterile gloves were used for the procedure. Local anesthesia was provided with 1% Lidocaine. The skin and subcutaneous tissues were infiltrated 1% lidocaine for local anesthesia, and a small stab incision was made with an 11 blade scalpel. Using CT guidance, a 17 gauge trocar needle was advanced into the right posterior chest wall/pleuraltarget. After confirmation of the tip, separate 18 gauge core biopsies were performed. These were placed into solution  for transportation to the lab. A final CT image was performed. Patient tolerated the procedure well and remained hemodynamically stable throughout. No complications were encountered and no significant blood loss was encounter IMPRESSION: Status post CT-guided biopsy right posterior pleural mass. Signed, Dulcy Fanny. Dellia Nims, RPVI [facility] Electronically Signed   By: Corrie Mckusick D.O.   On: 01/01/20 09:45    ASSESSMENT AND PLAN: This is a very pleasant 70 years old white male recently diagnosed with stage IIb (T1b, N1, M0) non-small cell lung cancer, adenocarcinoma status post right upper lobectomy with lymph node dissection under the care of Dr. Roxan Hockey on June 17, 2019. The patient had molecular studies on the [facility] clinical trial that showed negative for EGFR mutation as well as ALK gene translocation but PD-L1 expression in the range of 50-100%. The patient underwent standard adjuvant systemic chemotherapy with cisplatin 75 mg/M2 and Alimta 500 mg/M2 every 3 weeks status post 3 cycles.  His treatment was discontinued secondary to intolerance. Unfortunately repeat CT scan as well as PET scan after completion of the adjuvant chemotherapy showed concerning findings for disease recurrence with small right pleural  effusion as well as multiple pleural implants consistent with metastatic disease which was confirmed with repeat biopsy. He had molecular studies by [facility] [employer] and the blood test showed no actionable mutation.  His PD-L1 expression was 75%. I had a lengthy discussion with the patient today about his current condition and treatment options.  The patient refused to consider any systemic chemotherapy but is willing to consider treatment with immunotherapy. I gave him the option of palliative care versus treatment with monotherapy with immunotherapy with Libtayo (Cempilimab) 350 mg IV every 3 weeks for a total of 2 years unless the  patient has evidence for disease progression or unacceptable toxicity. I discussed with the patient the adverse effect of this treatment including but not limited to immunotherapy mediated skin rash, diarrhea, inflammation of the lung, kidney, thyroid or other endocrine dysfunction. The patient is interested in proceeding with this treatment. He is expected to start the first cycle of this treatment on January 24, 2019. He will come back for follow-up visit in 4 weeks for evaluation before the start of cycle #2. The patient was advised to call immediately if she has any other concerning symptoms in the interval.  The patient voices understanding of current disease status and treatment options and is in agreement with the current care plan.  All questions were answered. The patient knows to call the clinic with any problems, questions or concerns. We can certainly see the patient much sooner if necessary.  Disclaimer: This note was dictated with voice recognition software. Similar sounding words can inadvertently be transcribed and may not be corrected upon review.

## 2020-01-18 ENCOUNTER — Encounter: Payer: Self-pay | Admitting: *Deleted

## 2020-01-18 NOTE — Progress Notes (Signed)
I received a message from [facility] asking about Mr. Weninger's molecular test results from his second bx.  I did not find in the [facility] portal so I called our pathology dept.  I was told this test was sent out today.  I updated [facility] navigator.

## 2020-01-19 ENCOUNTER — Telehealth: Payer: Self-pay | Admitting: Internal Medicine

## 2020-01-19 NOTE — Telephone Encounter (Signed)
Scheduled per 1/3 los. Called pt and left a msg

## 2020-01-21 NOTE — Progress Notes (Signed)
Pharmacist Chemotherapy Monitoring - Initial Assessment    Anticipated start date: 01/21/20  Regimen:  . Are orders appropriate based on the patient's diagnosis, regimen, and cycle? Yes . Does the plan date match the patient's scheduled date? Yes . Is the sequencing of drugs appropriate? Yes . Are the premedications appropriate for the patient's regimen? Yes . Prior Authorization for treatment is: Approved o If applicable, is the correct biosimilar selected based on the patient's insurance? not applicable  Organ Function and Labs: [name] Are dose adjustments needed based on the patient's renal function, hepatic function, or hematologic function? Yes . Are appropriate labs ordered prior to the start of patient's treatment? Yes . Other organ system assessment, if indicated: N/A . The following baseline labs, if indicated, have been ordered: cemiplimab: baseline TSH +/- T4  Dose Assessment: . Are the drug doses appropriate? Yes . Are the following correct: o Drug concentrations Yes o IV fluid compatible with drug Yes o Administration routes Yes o Timing of therapy Yes . If applicable, does the patient have documented access for treatment and/or plans for port-a-cath placement? yes . If applicable, have lifetime cumulative doses been properly documented and assessed? not applicable Lifetime Dose Tracking  No doses have been documented on this patient for the following tracked chemicals: Doxorubicin, Epirubicin, Idarubicin, Daunorubicin, Mitoxantrone, Bleomycin, Oxaliplatin, Carboplatin, Liposomal Doxorubicin  o   Toxicity Monitoring/Prevention: . The patient has the following take home antiemetics prescribed: Prochlorperazine . The patient has the following take home medications prescribed: N/A . Medication allergies and previous infusion related reactions, if applicable, have been reviewed and addressed. Yes . The patient's current medication list has been assessed for drug-drug interactions  with their chemotherapy regimen. no significant drug-drug interactions were identified on review.  Order Review: . Are the treatment plan orders signed? Yes . Is the patient scheduled to see a provider prior to their treatment? No  I verify that I have reviewed each item in the above checklist and answered each question accordingly.  Luis [name] Course 01/21/2020 8:36 AM

## 2020-01-24 ENCOUNTER — Inpatient Hospital Stay: Payer: Medicare Other

## 2020-01-24 DIAGNOSIS — C3491 Malignant neoplasm of unspecified part of right bronchus or lung: Secondary | ICD-10-CM | POA: Diagnosis not present

## 2020-01-24 DIAGNOSIS — Z902 Acquired absence of lung [part of]: Secondary | ICD-10-CM | POA: Diagnosis not present

## 2020-01-24 DIAGNOSIS — I2510 Atherosclerotic heart disease of native coronary artery without angina pectoris: Secondary | ICD-10-CM | POA: Diagnosis not present

## 2020-01-24 DIAGNOSIS — I700 Atherosclerosis of aorta: Secondary | ICD-10-CM | POA: Diagnosis not present

## 2020-01-24 DIAGNOSIS — C3411 Malignant neoplasm of upper lobe, right bronchus or lung: Secondary | ICD-10-CM | POA: Diagnosis not present

## 2020-01-24 DIAGNOSIS — Z79899 Other long term (current) drug therapy: Secondary | ICD-10-CM | POA: Diagnosis not present

## 2020-01-24 DIAGNOSIS — Z23 Encounter for immunization: Secondary | ICD-10-CM | POA: Diagnosis not present

## 2020-01-24 DIAGNOSIS — J90 Pleural effusion, not elsewhere classified: Secondary | ICD-10-CM | POA: Diagnosis not present

## 2020-01-24 DIAGNOSIS — Z87891 Personal history of nicotine dependence: Secondary | ICD-10-CM | POA: Diagnosis not present

## 2020-01-24 DIAGNOSIS — J439 Emphysema, unspecified: Secondary | ICD-10-CM | POA: Diagnosis not present

## 2020-01-25 ENCOUNTER — Encounter: Payer: Self-pay | Admitting: Medical Oncology

## 2020-01-31 ENCOUNTER — Encounter: Payer: Self-pay | Admitting: Radiology

## 2020-01-31 ENCOUNTER — Other Ambulatory Visit: Payer: Self-pay | Admitting: Medical Oncology

## 2020-01-31 NOTE — Progress Notes (Signed)
[facility] [id number]: ADJUVANT LUNG CANCER ENRICHMENT MARKER IDENTIFICATION AND SEQUENCING TRIAL  01/31/2020    2:10PM  REQUEST FOR TISSUE: Faxed request for tissue for recurrence to [facility].   Carol Ada, RT(R)(T) Clinical Research Coordinator

## 2020-02-03 ENCOUNTER — Encounter: Payer: Self-pay | Admitting: Internal Medicine

## 2020-02-04 ENCOUNTER — Inpatient Hospital Stay: Payer: Medicare Other

## 2020-02-04 ENCOUNTER — Other Ambulatory Visit: Payer: Self-pay

## 2020-02-04 DIAGNOSIS — Z9221 Personal history of antineoplastic chemotherapy: Secondary | ICD-10-CM | POA: Diagnosis not present

## 2020-02-04 DIAGNOSIS — Z902 Acquired absence of lung [part of]: Secondary | ICD-10-CM | POA: Diagnosis not present

## 2020-02-04 DIAGNOSIS — M1990 Unspecified osteoarthritis, unspecified site: Secondary | ICD-10-CM | POA: Diagnosis not present

## 2020-02-04 DIAGNOSIS — C3411 Malignant neoplasm of upper lobe, right bronchus or lung: Secondary | ICD-10-CM | POA: Diagnosis not present

## 2020-02-04 DIAGNOSIS — C3491 Malignant neoplasm of unspecified part of right bronchus or lung: Secondary | ICD-10-CM

## 2020-02-04 DIAGNOSIS — Z5112 Encounter for antineoplastic immunotherapy: Secondary | ICD-10-CM | POA: Diagnosis not present

## 2020-02-04 DIAGNOSIS — R5382 Chronic fatigue, unspecified: Secondary | ICD-10-CM

## 2020-02-04 DIAGNOSIS — J449 Chronic obstructive pulmonary disease, unspecified: Secondary | ICD-10-CM | POA: Diagnosis not present

## 2020-02-04 LAB — CBC WITH DIFFERENTIAL (CANCER CENTER ONLY)
Abs Immature Granulocytes: 0.06 10*3/uL (ref 0.00–0.07)
Basophils Absolute: 0.1 10*3/uL (ref 0.0–0.1)
Basophils Relative: 1 %
Eosinophils Absolute: 0.1 10*3/uL (ref 0.0–0.5)
Eosinophils Relative: 1 %
HCT: 33.5 % — ABNORMAL LOW (ref 39.0–52.0)
Hemoglobin: 11 g/dL — ABNORMAL LOW (ref 13.0–17.0)
Immature Granulocytes: 1 %
Lymphocytes Relative: 21 %
Lymphs Abs: 1.8 10*3/uL (ref 0.7–4.0)
MCH: 27.8 pg (ref 26.0–34.0)
MCHC: 32.8 g/dL (ref 30.0–36.0)
MCV: 84.6 fL (ref 80.0–100.0)
Monocytes Absolute: 0.7 10*3/uL (ref 0.1–1.0)
Monocytes Relative: 8 %
Neutro Abs: 5.8 10*3/uL (ref 1.7–7.7)
Neutrophils Relative %: 68 %
Platelet Count: 385 10*3/uL (ref 150–400)
RBC: 3.96 MIL/uL — ABNORMAL LOW (ref 4.22–5.81)
RDW: 12.7 % (ref 11.5–15.5)
WBC Count: 8.5 10*3/uL (ref 4.0–10.5)
nRBC: 0 % (ref 0.0–0.2)

## 2020-02-04 LAB — CMP (CANCER CENTER ONLY)
ALT: 6 U/L (ref 0–44)
AST: 8 U/L — ABNORMAL LOW (ref 15–41)
Albumin: 3.5 g/dL (ref 3.5–5.0)
Alkaline Phosphatase: 64 U/L (ref 38–126)
Anion gap: 9 (ref 5–15)
BUN: 17 mg/dL (ref 8–23)
CO2: 27 mmol/L (ref 22–32)
Calcium: 9.4 mg/dL (ref 8.9–10.3)
Chloride: 102 mmol/L (ref 98–111)
Creatinine: 1.19 mg/dL (ref 0.61–1.24)
GFR, Estimated: >60 mL/min (ref >60)
Glucose, Bld: 88 mg/dL (ref 70–99)
Potassium: 4 mmol/L (ref 3.5–5.1)
Sodium: 138 mmol/L (ref 135–145)
Total Bilirubin: 0.4 mg/dL (ref 0.3–1.2)
Total Protein: 7.3 g/dL (ref 6.5–8.1)

## 2020-02-04 LAB — TSH: TSH: 1.904 u[IU]/mL (ref 0.320–4.118)

## 2020-02-06 ENCOUNTER — Encounter: Payer: Self-pay | Admitting: Medical

## 2020-02-06 ENCOUNTER — Other Ambulatory Visit: Payer: Self-pay | Admitting: Medical

## 2020-02-06 ENCOUNTER — Inpatient Hospital Stay: Payer: Medicare Other

## 2020-02-06 ENCOUNTER — Ambulatory Visit (HOSPITAL_COMMUNITY)
Admission: RE | Admit: 2020-02-06 | Discharge: 2020-02-06 | Disposition: A | Discharge status: Home / Self Care | Payer: Medicare Other | Source: Ambulatory Visit | Attending: Medical | Admitting: Medical

## 2020-02-06 ENCOUNTER — Other Ambulatory Visit: Payer: Self-pay

## 2020-02-06 VITALS — BP 113/73 | HR 106 | Temp 97.8°F | Resp 20

## 2020-02-06 DIAGNOSIS — M1990 Unspecified osteoarthritis, unspecified site: Secondary | ICD-10-CM | POA: Diagnosis not present

## 2020-02-06 DIAGNOSIS — Z902 Acquired absence of lung [part of]: Secondary | ICD-10-CM | POA: Diagnosis not present

## 2020-02-06 DIAGNOSIS — Z9221 Personal history of antineoplastic chemotherapy: Secondary | ICD-10-CM | POA: Diagnosis not present

## 2020-02-06 DIAGNOSIS — C3491 Malignant neoplasm of unspecified part of right bronchus or lung: Secondary | ICD-10-CM

## 2020-02-06 DIAGNOSIS — Z5112 Encounter for antineoplastic immunotherapy: Secondary | ICD-10-CM | POA: Diagnosis not present

## 2020-02-06 DIAGNOSIS — R1084 Generalized abdominal pain: Secondary | ICD-10-CM

## 2020-02-06 DIAGNOSIS — J449 Chronic obstructive pulmonary disease, unspecified: Secondary | ICD-10-CM | POA: Diagnosis not present

## 2020-02-06 DIAGNOSIS — C3411 Malignant neoplasm of upper lobe, right bronchus or lung: Secondary | ICD-10-CM | POA: Diagnosis not present

## 2020-02-06 DIAGNOSIS — R109 Unspecified abdominal pain: Secondary | ICD-10-CM | POA: Diagnosis not present

## 2020-02-06 MED ORDER — SODIUM CHLORIDE 0.9 % IV SOLN
350.0000 mg | Freq: Once | INTRAVENOUS | Status: AC
  Administered 2020-02-06: 350 mg via INTRAVENOUS
  Filled 2020-02-06: qty 7

## 2020-02-06 MED ORDER — SODIUM CHLORIDE 0.9 % IV SOLN
Freq: Once | INTRAVENOUS | Status: AC
  Filled 2020-02-06: qty 250

## 2020-02-06 NOTE — Progress Notes (Signed)
These results were reviewed with the patient. He was given information on the management of constipation.

## 2020-02-06 NOTE — Patient Instructions (Signed)
[facility] Discharge Instructions for Patients Receiving Chemotherapy  Today you received the following chemotherapy agents Libtayo  To help prevent nausea and vomiting after your treatment, we encourage you to take your nausea medication as directed.    If you develop nausea and vomiting that is not controlled by your nausea medication, call the clinic.   BELOW ARE SYMPTOMS THAT SHOULD BE REPORTED IMMEDIATELY:  *FEVER GREATER THAN 100.5 F  *CHILLS WITH OR WITHOUT FEVER  NAUSEA AND VOMITING THAT IS NOT CONTROLLED WITH YOUR NAUSEA MEDICATION  *UNUSUAL SHORTNESS OF BREATH  *UNUSUAL BRUISING OR BLEEDING  TENDERNESS IN MOUTH AND THROAT WITH OR WITHOUT PRESENCE OF ULCERS  *URINARY PROBLEMS  *BOWEL PROBLEMS  UNUSUAL RASH Items with * indicate a potential emergency and should be followed up as soon as possible.  Feel free to call the clinic should you have any questions or concerns. The clinic phone number is [phone].  Please show the [facility] at check-in to the Emergency Department and triage nurse.  Cemiplimab injection What is this medicine? CEMIPLIMAB (se mip li mab) is a monoclonal antibody. It treats certain types of cancer. Some of the cancers treated are cutaneous squamous cell carcinoma and basal cell carcinoma. This medicine may be used for other purposes; ask your health care provider or pharmacist if you have questions. COMMON BRAND NAME(S): LIBTAYO What should I tell my health care provider before I take this medicine? They need to know if you have any of these conditions:  autoimmune diseases like Crohn's disease, ulcerative colitis, or lupus  have had or planning to have an allogeneic stem cell transplant (uses someone else's stem cells)  history of organ transplant  nervous system problems like myasthenia gravis or Guillain-Barre syndrome  an unusual or allergic reaction to cemiplimab, other drugs, foods, dyes, or  preservatives  pregnant or trying to get pregnant  breast-feeding How should I use this medicine? This medicine is for infusion into a vein. It is given by a health care professional in a hospital or clinic setting. A special MedGuide will be given to you before each treatment. Be sure to read this information carefully each time. Talk to your pediatrician regarding the use of this medicine in children. Special care may be needed. Overdosage: If you think you have taken too much of this medicine contact a poison control center or emergency room at once. NOTE: This medicine is only for you. Do not share this medicine with others. What if I miss a dose? It is important not to miss your dose. Call your doctor or health care professional if you are unable to keep an appointment. What may interact with this medicine? Interactions have not been studied. This list may not describe all possible interactions. Give your health care provider a list of all the medicines, herbs, non-prescription drugs, or dietary supplements you use. Also tell them if you smoke, drink alcohol, or use illegal drugs. Some items may interact with your medicine. What should I watch for while using this medicine? Your condition will be monitored carefully while you are receiving this medicine. You may need blood work done while you are taking this medicine. Do not become pregnant while taking this medicine or for at least 4 months after stopping it. Women should inform their doctor if they wish to become pregnant or think they might be pregnant. There is a potential for serious side effects to an unborn child. Talk to your health care professional or pharmacist  for more information. Do not breast-feed an infant while taking this medicine or for at least 4 months after the last dose. What side effects may I notice from receiving this medicine? Side effects that you should report to your doctor or health care professional as soon  as possible:  allergic reactions like skin rash, itching or hives; swelling of the face, lips, or tongue  black, tarry stools  bloody or watery diarrhea  breathing problems  changes in vision  changes in voice  chest pain or chest tightness  chills  cough  dizziness  fast or irregular heart beat  feeling faint or lightheaded  hair loss  increased hunger or thirst  muscle weakness  persistent headache  redness, blistering, peeling or loosening of the skin, including inside the mouth  signs and symptoms of kidney injury like trouble passing urine or change in the amount of urine  signs and symptoms of liver injury like dark yellow or brown urine; general ill feeling or flu-like symptoms; light-colored stools; loss of appetite; nausea; right upper belly pain; unusually weak or tired; yellowing of the eyes or skin  stomach pain  unusual bleeding or bruising  weight gain or weight loss  unusual sweating Side effects that usually do not require medical attention (report these to your doctor or health care professional if they continue or are bothersome):  bone pain  constipation  muscle pain  tiredness This list may not describe all possible side effects. Call your doctor for medical advice about side effects. You may report side effects to FDA at 1-800-FDA-1088. Where should I keep my medicine? This drug is given in a hospital or clinic and will not be stored at home. NOTE: This sheet is a summary. It may not cover all possible information. If you have questions about this medicine, talk to your doctor, pharmacist, or health care provider.  2021 Elsevier/Gold Standard (2019-03-02 10:47:00)

## 2020-02-06 NOTE — Progress Notes (Signed)
Ok to treat with HR 106 per Sandi Mealy, PA

## 2020-02-07 ENCOUNTER — Other Ambulatory Visit: Payer: Self-pay | Admitting: Medical Oncology

## 2020-02-07 ENCOUNTER — Encounter: Payer: Self-pay | Admitting: Internal Medicine

## 2020-02-07 DIAGNOSIS — I878 Other specified disorders of veins: Secondary | ICD-10-CM

## 2020-02-07 NOTE — Telephone Encounter (Signed)
Discussed this with Dr. Julien Nordmann who advised he does not feel these sx are a result of the pts tx and if pt worsens, he should go to the ER.  I have advised the pts wife as indicated. She expressed understanding of this information.

## 2020-02-08 ENCOUNTER — Encounter (HOSPITAL_COMMUNITY): Payer: Self-pay | Admitting: Internal Medicine

## 2020-02-14 ENCOUNTER — Ambulatory Visit: Payer: PRIVATE HEALTH INSURANCE | Admitting: Internal Medicine

## 2020-02-14 ENCOUNTER — Ambulatory Visit: Payer: PRIVATE HEALTH INSURANCE

## 2020-02-14 ENCOUNTER — Other Ambulatory Visit: Payer: PRIVATE HEALTH INSURANCE

## 2020-02-17 ENCOUNTER — Other Ambulatory Visit: Payer: Self-pay | Admitting: Radiology

## 2020-02-21 ENCOUNTER — Other Ambulatory Visit: Payer: Self-pay | Admitting: Internal Medicine

## 2020-02-21 ENCOUNTER — Ambulatory Visit (HOSPITAL_COMMUNITY)
Admission: RE | Admit: 2020-02-21 | Discharge: 2020-02-21 | Disposition: A | Discharge status: Home / Self Care | Payer: Medicare Other | Source: Ambulatory Visit | Attending: Internal Medicine | Admitting: Internal Medicine

## 2020-02-21 ENCOUNTER — Other Ambulatory Visit: Payer: Self-pay

## 2020-02-21 ENCOUNTER — Encounter (HOSPITAL_COMMUNITY): Payer: Self-pay

## 2020-02-21 DIAGNOSIS — Z888 Allergy status to other drugs, medicaments and biological substances status: Secondary | ICD-10-CM | POA: Insufficient documentation

## 2020-02-21 DIAGNOSIS — Z881 Allergy status to other antibiotic agents status: Secondary | ICD-10-CM | POA: Insufficient documentation

## 2020-02-21 DIAGNOSIS — Z79899 Other long term (current) drug therapy: Secondary | ICD-10-CM | POA: Insufficient documentation

## 2020-02-21 DIAGNOSIS — Z885 Allergy status to narcotic agent status: Secondary | ICD-10-CM | POA: Insufficient documentation

## 2020-02-21 DIAGNOSIS — Z87891 Personal history of nicotine dependence: Secondary | ICD-10-CM | POA: Diagnosis not present

## 2020-02-21 DIAGNOSIS — C3490 Malignant neoplasm of unspecified part of unspecified bronchus or lung: Secondary | ICD-10-CM | POA: Insufficient documentation

## 2020-02-21 DIAGNOSIS — Z452 Encounter for adjustment and management of vascular access device: Secondary | ICD-10-CM | POA: Diagnosis not present

## 2020-02-21 DIAGNOSIS — I878 Other specified disorders of veins: Secondary | ICD-10-CM

## 2020-02-21 HISTORY — PX: IR IMAGING GUIDED PORT INSERTION: IMG5740

## 2020-02-21 HISTORY — DX: Dizziness and giddiness: R42

## 2020-02-21 HISTORY — DX: Other fatigue: R53.83

## 2020-02-21 LAB — CBC WITH DIFFERENTIAL/PLATELET
Abs Immature Granulocytes: 0.03 10*3/uL (ref 0.00–0.07)
Basophils Absolute: 0.1 10*3/uL (ref 0.0–0.1)
Basophils Relative: 1 %
Eosinophils Absolute: 0.1 10*3/uL (ref 0.0–0.5)
Eosinophils Relative: 2 %
HCT: 35.6 % — ABNORMAL LOW (ref 39.0–52.0)
Hemoglobin: 11.3 g/dL — ABNORMAL LOW (ref 13.0–17.0)
Immature Granulocytes: 1 %
Lymphocytes Relative: 17 %
Lymphs Abs: 1 10*3/uL (ref 0.7–4.0)
MCH: 27.2 pg (ref 26.0–34.0)
MCHC: 31.7 g/dL (ref 30.0–36.0)
MCV: 85.6 fL (ref 80.0–100.0)
Monocytes Absolute: 0.5 10*3/uL (ref 0.1–1.0)
Monocytes Relative: 8 %
Neutro Abs: 4.3 10*3/uL (ref 1.7–7.7)
Neutrophils Relative %: 71 %
Platelets: 347 10*3/uL (ref 150–400)
RBC: 4.16 MIL/uL — ABNORMAL LOW (ref 4.22–5.81)
RDW: 14.1 % (ref 11.5–15.5)
WBC: 5.9 10*3/uL (ref 4.0–10.5)
nRBC: 0 % (ref 0.0–0.2)

## 2020-02-21 MED ORDER — HEPARIN SOD (PORK) LOCK FLUSH 100 UNIT/ML IV SOLN
INTRAVENOUS | Status: AC
  Filled 2020-02-21: qty 5

## 2020-02-21 MED ORDER — CEFAZOLIN SODIUM-DEXTROSE 2-4 GM/100ML-% IV SOLN
INTRAVENOUS | Status: AC
  Administered 2020-02-21: 2 g via INTRAVENOUS
  Filled 2020-02-21: qty 100

## 2020-02-21 MED ORDER — MIDAZOLAM HCL 2 MG/2ML IJ SOLN
INTRAMUSCULAR | Status: AC | PRN
  Administered 2020-02-21 (×2): 1 mg via INTRAVENOUS

## 2020-02-21 MED ORDER — LIDOCAINE-EPINEPHRINE 1 %-1:100000 IJ SOLN
INTRAMUSCULAR | Status: AC | PRN
  Administered 2020-02-21: 10 mL

## 2020-02-21 MED ORDER — MIDAZOLAM HCL 2 MG/2ML IJ SOLN
INTRAMUSCULAR | Status: AC
  Filled 2020-02-21: qty 4

## 2020-02-21 MED ORDER — HEPARIN SOD (PORK) LOCK FLUSH 100 UNIT/ML IV SOLN
INTRAVENOUS | Status: AC | PRN
  Administered 2020-02-21: 500 [IU] via INTRAVENOUS

## 2020-02-21 MED ORDER — LIDOCAINE-EPINEPHRINE 1 %-1:100000 IJ SOLN
INTRAMUSCULAR | Status: AC
  Filled 2020-02-21: qty 1

## 2020-02-21 MED ORDER — CEFAZOLIN SODIUM-DEXTROSE 2-4 GM/100ML-% IV SOLN: 2.0000 g | INTRAVENOUS | Status: AC

## 2020-02-21 MED ORDER — FENTANYL CITRATE (PF) 100 MCG/2ML IJ SOLN
INTRAMUSCULAR | Status: AC | PRN
  Administered 2020-02-21 (×2): 50 ug via INTRAVENOUS

## 2020-02-21 MED ORDER — SODIUM CHLORIDE 0.9 % IV SOLN: INTRAVENOUS | Status: DC

## 2020-02-21 MED ORDER — FENTANYL CITRATE (PF) 100 MCG/2ML IJ SOLN
INTRAMUSCULAR | Status: AC
  Filled 2020-02-21: qty 2

## 2020-02-21 NOTE — Discharge Instructions (Signed)
Urgent needs - Interventional Radiology on call MD [phone]  Wound - May remove dressing and shower in 24 to 48 hours.  Keep site clean and dry.  Replace with bandaid as needed.  Do not submerge in tub or water until site healing well. If closed with glue, glue will flake off on its own.  If ordered by your provider, may start Emla cream in 2 weeks or after incision is healed.  After completion of treatment, your provider should have you set up for monthly port flushes.    Implanted Port Insertion, Care After This sheet gives you information about how to care for yourself after your procedure. Your health care provider may also give you more specific instructions. If you have problems or questions, contact your health care provider. What can I expect after the procedure? After the procedure, it is common to have:  Discomfort at the port insertion site.  Bruising on the skin over the port. This should improve over 3-4 days. Follow these instructions at home: [facility] care  After your port is placed, you will get a manufacturer's information card. The card has information about your port. Keep this card with you at all times.  Take care of the port as told by your health care provider. Ask your health care provider if you or a family member can get training for taking care of the port at home. A home health care nurse may also take care of the port.  Make sure to remember what type of port you have. Incision care  Follow instructions from your health care provider about how to take care of your port insertion site. Make sure you: ? Wash your hands with soap and water before and after you change your bandage (dressing). If soap and water are not available, use hand sanitizer. ? Change your dressing as told by your health care provider. ? Leave stitches (sutures), skin glue, or adhesive strips in place. These skin closures may need to stay in place for 2 weeks or longer. If adhesive strip  edges start to loosen and curl up, you may trim the loose edges. Do not remove adhesive strips completely unless your health care provider tells you to do that.  Check your port insertion site every day for signs of infection. Check for: ? Redness, swelling, or pain. ? Fluid or blood. ? Warmth. ? Pus or a bad smell.      Activity  Return to your normal activities as told by your health care provider. Ask your health care provider what activities are safe for you.  Do not lift anything that is heavier than 10 lb (4.5 kg), or the limit that you are told, until your health care provider says that it is safe. General instructions  Take over-the-counter and prescription medicines only as told by your health care provider.  Do not take baths, swim, or use a hot tub until your health care provider approves. Ask your health care provider if you may take showers. You may only be allowed to take sponge baths.  Do not drive for 24 hours if you were given a sedative during your procedure.  Wear a medical alert bracelet in case of an emergency. This will tell any health care providers that you have a port.  Keep all follow-up visits as told by your health care provider. This is important. Contact a health care provider if:  You cannot flush your port with saline as directed, or you cannot draw blood  from the port.  You have a fever or chills.  You have redness, swelling, or pain around your port insertion site.  You have fluid or blood coming from your port insertion site.  Your port insertion site feels warm to the touch.  You have pus or a bad smell coming from the port insertion site. Get help right away if:  You have chest pain or shortness of breath.  You have bleeding from your port that you cannot control. Summary  Take care of the port as told by your health care provider. Keep the manufacturer's information card with you at all times.  Change your dressing as told by your  health care provider.  Contact a health care provider if you have a fever or chills or if you have redness, swelling, or pain around your port insertion site.  Keep all follow-up visits as told by your health care provider. This information is not intended to replace advice given to you by your health care provider. Make sure you discuss any questions you have with your health care provider. Document Revised: 07/29/2017 Document Reviewed: 07/29/2017 Elsevier Patient Education  2021 [place].   Moderate Conscious Sedation, Adult, Care After This sheet gives you information about how to care for yourself after your procedure. Your health care provider may also give you more specific instructions. If you have problems or questions, contact your health care provider. What can I expect after the procedure? After the procedure, it is common to have:  Sleepiness for several hours.  Impaired judgment for several hours.  Difficulty with balance.  Vomiting if you eat too soon. Follow these instructions at home: For the time period you were told by your health care provider:  Rest.  Do not participate in activities where you could fall or become injured.  Do not drive or use machinery.  Do not drink alcohol.  Do not take sleeping pills or medicines that cause drowsiness.  Do not make important decisions or sign legal documents.  Do not take care of children on your own.      Eating and drinking  Follow the diet recommended by your health care provider.  Drink enough fluid to keep your urine pale yellow.  If you vomit: ? Drink water, juice, or soup when you can drink without vomiting. ? Make sure you have little or no nausea before eating solid foods.   General instructions  Take over-the-counter and prescription medicines only as told by your health care provider.  Have a responsible adult stay with you for the time you are told. It is important to have someone help  care for you until you are awake and alert.  Do not smoke.  Keep all follow-up visits as told by your health care provider. This is important. Contact a health care provider if:  You are still sleepy or having trouble with balance after 24 hours.  You feel light-headed.  You keep feeling nauseous or you keep vomiting.  You develop a rash.  You have a fever.  You have redness or swelling around the IV site. Get help right away if:  You have trouble breathing.  You have new-onset confusion at home. Summary  After the procedure, it is common to feel sleepy, have impaired judgment, or feel nauseous if you eat too soon.  Rest after you get home. Know the things you should not do after the procedure.  Follow the diet recommended by your health care provider and drink enough  fluid to keep your urine pale yellow.  Get help right away if you have trouble breathing or new-onset confusion at home. This information is not intended to replace advice given to you by your health care provider. Make sure you discuss any questions you have with your health care provider. Document Revised: 04/30/2019 Document Reviewed: 11/26/2018 Elsevier Patient Education  2021 [employer].

## 2020-02-21 NOTE — Procedures (Signed)
Interventional Radiology Procedure Note  Procedure: RT IJ POWER PORT    Complications: None  Estimated Blood Loss:  MIN  Findings: TIP SVCRA    M. TREVOR Christipher Rieger, MD    

## 2020-02-21 NOTE — H&P (Signed)
Chief Complaint: Port-A-Catheter placement for lung cancer treatment   Referring Physician(s): Dr. Curt Bears  Supervising Physician: Daryll Brod  Patient Status: [facility] - Out-pt  History of Present Illness: Luis [name] is a 70 y.o. male with history of right upper lobe non-small cell lung cancer which was originally diagnosed in June 2021. He is status post right upper lobectomy and adjuvant chemotherapy.  PET done 12/16/19 showed widespread hypermetabolic right pleural soft tissue deposits with small right pleural effusion, compatible with right pleural metastatic disease. He underwent for pleural mass biopsy in 12/30/19 which revealed poorly differentiated carcinoma.  He is here today for image guided Port-A-Catheter placement.   He is NPO. Mild dizziness, but patient states that he is slightly dizzy at baseline due to chemotherapy.  No nausea/vomiting. No Fever/chills. He does c/o pain on the right ribs at the area where the mass is, otherwise ROS negative.   Past Medical History:  Diagnosis Date  . Arthritis    through out body  . Cancer ([place])   . Chronic bronchitis ([place])   . Concussion   . COPD (chronic obstructive pulmonary disease) ([place])   . Dyspnea   . Emphysema lung ([place])   . Fatigue   . Frozen shoulder    LEFT  . GERD (gastroesophageal reflux disease)   . Hard of hearing   . Headache    alot of days, related to spine issues  . Hypertension   . Lumbar radiculopathy 2013  . Peyronie's disease   . Prostatitis 2011   Dr. Gaynelle Arabian  . RLS (restless legs syndrome)   . Shingles 06/13/2018   shingles on back, finished with prednisone, stills has scabs and pain  . Thigh pain    Bilateral Thigh  . Thigh pain 10/13   bilateral  . Tobacco abuse 2010   still uses electronic cig/ emphysema, SOB easily  . Wears partial dentures    upper    Past Surgical History:  Procedure Laterality Date  . CATARACT EXTRACTION W/PHACO Right 07/08/2018    Procedure: CATARACT EXTRACTION PHACO AND INTRAOCULAR LENS PLACEMENT ([place]) RIGHT;  Surgeon: Leandrew Koyanagi, MD;  Location: [place];  Service: Ophthalmology;  Laterality: Right;  . CATARACT EXTRACTION W/PHACO Left 07/29/2018   Procedure: CATARACT EXTRACTION PHACO AND INTRAOCULAR LENS PLACEMENT ([place]) LEFT TORIC LENS;  Surgeon: Leandrew Koyanagi, MD;  Location: [place];  Service: Ophthalmology;  Laterality: Left;  . CHEST TUBE INSERTION Right 07/01/2019   Procedure: CHEST TUBE INSERTION;  Surgeon: [name], MD;  Location: [place];  Service: Thoracic;  Laterality: Right;  . COLONOSCOPY    . EYE SURGERY    . INTERCOSTAL NERVE BLOCK Right 06/17/2019   Procedure: INTERCOSTAL NERVE BLOCK;  Surgeon: [name], MD;  Location: [place];  Service: Thoracic;  Laterality: Right;  . INTERCOSTAL NERVE BLOCK Right 07/01/2019   Procedure: INTERCOSTAL NERVE BLOCK;  Surgeon: [name], MD;  Location: [place];  Service: Thoracic;  Laterality: Right;  . TONSILLECTOMY    . VIDEO ASSISTED THORACOSCOPY Right 07/01/2019   Procedure: VIDEO ASSISTED THORACOSCOPY - REMOVAL OF RIGHT MIDDLE LOBE BLEBS;  Surgeon: [name], MD;  Location: [place];  Service: Thoracic;  Laterality: Right;  [name] VIDEO BRONCHOSCOPY WITH INSERTION OF INTERBRONCHIAL VALVE (IBV) Right 06/25/2019   Procedure: VIDEO BRONCHOSCOPY WITH INSERTION OF INTERBRONCHIAL VALVE (IBV); Size 7 IBV into Right Loer Lobe (RLL) Superior Segment, Size 9 IBV into RLL Basilar Segment;  Surgeon: [name],  MD;  Location: [place];  Service: Thoracic;  Laterality: Right;  [name] VIDEO BRONCHOSCOPY WITH INSERTION OF INTERBRONCHIAL VALVE (IBV) N/A 08/06/2019   Procedure: VIDEO BRONCHOSCOPY WITH REMOVAL OF INTERBRONCHIAL VALVE (IBV);  Surgeon: [name], MD;  Location: [facility];  Service: Thoracic;  Laterality: N/A;    Allergies: Ativan [lorazepam], Doxycycline, and Tramadol  Medications: Prior to  Admission medications   Medication Sig Start Date End Date Taking? Authorizing Provider  acetaminophen (TYLENOL) 500 MG tablet Take 1,000 mg by mouth at bedtime as needed for moderate pain.   Yes [provider]  Cholecalciferol (VITAMIN D) 125 MCG (5000 UT) CAPS Take 5,000 Units by mouth 3 (three) times a week.   Yes [provider]  Tiotropium Bromide-Olodaterol (STIOLTO RESPIMAT) 2.5-2.5 MCG/ACT AERS Inhale 2 puffs into the lungs daily. 05/06/19  Yes Tanda Rockers, MD  valsartan-hydrochlorothiazide (DIOVAN-HCT) 80-12.5 MG tablet Take 1 tablet by mouth daily.   Yes [provider]  oxyCODONE-acetaminophen (PERCOCET/ROXICET) 5-325 MG tablet Take 1 tablet by mouth every 6 (six) hours as needed for severe pain. Patient not taking: Reported on 12/28/2019 12/16/19   Heilingoetter, Cassandra L, PA-C     Family History  Problem Relation Age of Onset  . Diabetes Mother   . Hyperlipidemia Father   . Hypertension Father     Social History   Socioeconomic History  . Marital status: Married    Spouse name: Not on file  . Number of children: Not on file  . Years of education: 62  . Highest education level: Not on file  Occupational History  . Occupation: Social research officer, government - [employer]   Tobacco Use  . Smoking status: Former Smoker    Packs/day: 2.00    Years: 40.00    Pack years: 80.00    Types: Cigarettes    Quit date: 01/14/2009    Years since quitting: 11.1  . Smokeless tobacco: Former Network engineer  . Vaping Use: Former  . Start date: 01/15/1999  . Substances: Nicotine, Flavoring  Substance and Sexual Activity  . Alcohol use: Yes    Alcohol/week: 12.0 standard drinks    Types: 12 Cans of beer per week  . Drug use: No  . Sexual activity: Not on file  Other Topics Concern  . Not on file  Social History Narrative  . Not on file   Social Determinants of Health   Financial Resource Strain: Not on file  Food Insecurity: No Food Insecurity  . Worried About Ship broker in the Last Year: Never true  . Ran Out of Food in the Last Year: Never true  Transportation Needs: No Transportation Needs  . Lack of Transportation (Medical): No  . Lack of Transportation (Non-Medical): No  Physical Activity: Not on file  Stress: Not on file  Social Connections: Not on file     Review of Systems: A 12 point ROS discussed and pertinent positives are indicated in the HPI above.  All other systems are negative.  Review of Systems  Constitutional: Negative for chills and fever.  Respiratory: Positive for cough. Negative for chest tightness and shortness of breath.        Due to history of emphysema   Cardiovascular: Negative for chest pain, palpitations and leg swelling.  Gastrointestinal: Negative for abdominal pain, anal bleeding, blood in stool, nausea and vomiting.  Neurological: Positive for dizziness and light-headedness.       Due to chemotherapy per patient   Psychiatric/Behavioral: Negative for confusion.  Vital Signs: BP (!) 160/82 (BP Location: Right Arm)   Pulse 87   Temp 97.9 F (36.6 C) (Oral)   Resp 20   SpO2 97%   Physical Exam Vitals reviewed.  Constitutional:      Appearance: Normal appearance.  HENT:     Head: Normocephalic and atraumatic.  Eyes:     Extraocular Movements: Extraocular movements intact.  Cardiovascular:     Rate and Rhythm: Normal rate and regular rhythm.  Pulmonary:     Effort: Pulmonary effort is normal. No respiratory distress.     Breath sounds: Normal breath sounds.  Abdominal:     General: There is no distension.     Palpations: Abdomen is soft.     Tenderness: There is no abdominal tenderness.  Musculoskeletal:        General: Normal range of motion.     Cervical back: Normal range of motion.  Skin:    General: Skin is warm and dry.  Neurological:     General: No focal deficit present.     Mental Status: He is alert and oriented to person, place, and time.  Psychiatric:        Mood and Affect:  Mood normal.        Behavior: Behavior normal.        Thought Content: Thought content normal.        Judgment: Judgment normal.     Imaging: DG Abd 2 Views  Result Date: 02/06/2020 CLINICAL DATA:  Constipation, abdominal pain EXAM: ABDOMEN - 2 VIEW COMPARISON:  None. FINDINGS: Nonobstructive bowel gas pattern. No evidence of free air under the diaphragm on the upright view. Moderate stool in the right colon. Visualized osseous structures are within normal limits. Patchy opacities in the right lower lung, poorly evaluated, with associated small right pleural effusion. IMPRESSION: No evidence of small bowel obstruction or free air. Moderate stool in the right colon. Electronically Signed   By: Julian Hy M.D.   On: 02/06/2020 09:06    Labs:  CBC: Recent Labs    11/04/19 0852 12/30/19 0630 02/04/20 1156 02/21/20 0813  WBC 3.5* 7.0 8.5 5.9  HGB 11.9* 12.8* 11.0* 11.3*  HCT 34.6* 37.4* 33.5* 35.6*  PLT 426* 302 385 347    COAGS: Recent Labs    06/15/19 1341 07/01/19 0215 08/04/19 1417 12/30/19 0630  INR 1.0 1.0 0.9 0.9  APTT 32 38* 31  --     BMP: Recent Labs    09/30/19 1031 10/07/19 1007 10/08/19 0952 10/14/19 0838 10/28/19 1033 11/04/19 0852 12/30/19 0630 02/04/20 1156  NA 133* 135 133* 132* 135 134* 138 138  K 3.9 5.8* 5.0 4.0 4.9 4.4 4.0 4.0  CL 96* 101 95* 95* 97* 97* 101 102  CO2 26 30 32 26 33* 21* 25 27  GLUCOSE 82 113* 76 228* 137* 203* 96 88  BUN 21 17 15 16 12 15 15 17   CALCIUM 8.8* 9.6 9.8 9.4 9.5 9.7 9.5 9.4  CREATININE 1.16 0.93 0.89 1.14 1.08 1.18 1.17 1.19  GFRNONAA >60 >60 >60 >60 >60 >60 >60 >60  GFRAA >60 >60 >60 >60  --   --   --   --     LIVER FUNCTION TESTS: Recent Labs    10/14/19 0838 10/28/19 1033 11/04/19 0852 02/04/20 1156  BILITOT 0.3 0.4 0.3 0.4  AST 19 7* 7* 8*  ALT 74* <6 <6 6  ALKPHOS 181* 84 92 64  PROT 7.0 6.4* 7.0 7.3  ALBUMIN 3.4* 3.1* 3.5 3.5    TUMOR MARKERS: No results for input(s): AFPTM, CEA,  CA199, CHROMGRNA in the last 8760 hours.  Assessment and Plan: Patient with poorly differentiated carcinoma favoring adenocarcinoma of lung diagnosed in 12/30/19, with history of invasive poorly differentiated carcinoma diagnosed in June 2021.  Will proceed with image guided Port-A-Catheter placement with Dr. Annamaria Boots.  Risks and benefits of image guided port-a-catheter placement was discussed with the patient including, but not limited to bleeding, infection, and pneumothorax.   All of the patient's questions were answered, patient is agreeable to proceed. Consent signed and in chart.  Thank you for this interesting consult.  I greatly enjoyed meeting Luis [name] and look forward to participating in their care.  A copy of this report was sent to the requesting provider on this date.  Electronically Signed: Tera Mater, PA-C   02/21/2020, 9:01 AM      I spent a total of  30 Minutes  in face to face in clinical consultation, greater than 50% of which was counseling/coordinating care for port-a-catheter.

## 2020-02-28 ENCOUNTER — Other Ambulatory Visit: Payer: Self-pay

## 2020-02-28 ENCOUNTER — Inpatient Hospital Stay: Payer: Medicare Other | Attending: Internal Medicine

## 2020-02-28 ENCOUNTER — Inpatient Hospital Stay: Payer: Medicare Other

## 2020-02-28 ENCOUNTER — Encounter: Payer: Self-pay | Admitting: Internal Medicine

## 2020-02-28 ENCOUNTER — Inpatient Hospital Stay (HOSPITAL_BASED_OUTPATIENT_CLINIC_OR_DEPARTMENT_OTHER): Payer: Medicare Other | Admitting: Internal Medicine

## 2020-02-28 VITALS — BP 154/85 | HR 89 | Temp 98.4°F | Resp 16 | Ht 66.0 in | Wt 132.7 lb

## 2020-02-28 DIAGNOSIS — R5382 Chronic fatigue, unspecified: Secondary | ICD-10-CM

## 2020-02-28 DIAGNOSIS — Z5112 Encounter for antineoplastic immunotherapy: Secondary | ICD-10-CM

## 2020-02-28 DIAGNOSIS — R509 Fever, unspecified: Secondary | ICD-10-CM | POA: Diagnosis not present

## 2020-02-28 DIAGNOSIS — K219 Gastro-esophageal reflux disease without esophagitis: Secondary | ICD-10-CM | POA: Diagnosis not present

## 2020-02-28 DIAGNOSIS — J449 Chronic obstructive pulmonary disease, unspecified: Secondary | ICD-10-CM | POA: Insufficient documentation

## 2020-02-28 DIAGNOSIS — C3491 Malignant neoplasm of unspecified part of right bronchus or lung: Secondary | ICD-10-CM

## 2020-02-28 DIAGNOSIS — Z79899 Other long term (current) drug therapy: Secondary | ICD-10-CM | POA: Diagnosis not present

## 2020-02-28 DIAGNOSIS — I10 Essential (primary) hypertension: Secondary | ICD-10-CM | POA: Diagnosis not present

## 2020-02-28 DIAGNOSIS — C3411 Malignant neoplasm of upper lobe, right bronchus or lung: Secondary | ICD-10-CM | POA: Insufficient documentation

## 2020-02-28 LAB — CMP (CANCER CENTER ONLY)
ALT: 10 U/L (ref 0–44)
AST: 17 U/L (ref 15–41)
Albumin: 4 g/dL (ref 3.5–5.0)
Alkaline Phosphatase: 76 U/L (ref 38–126)
Anion gap: 8 (ref 5–15)
BUN: 15 mg/dL (ref 8–23)
CO2: 27 mmol/L (ref 22–32)
Calcium: 9.5 mg/dL (ref 8.9–10.3)
Chloride: 101 mmol/L (ref 98–111)
Creatinine: 0.98 mg/dL (ref 0.61–1.24)
GFR, Estimated: >60 mL/min (ref >60)
Glucose, Bld: 86 mg/dL (ref 70–99)
Potassium: 4.2 mmol/L (ref 3.5–5.1)
Sodium: 136 mmol/L (ref 135–145)
Total Bilirubin: 0.5 mg/dL (ref 0.3–1.2)
Total Protein: 7.1 g/dL (ref 6.5–8.1)

## 2020-02-28 LAB — CBC WITH DIFFERENTIAL (CANCER CENTER ONLY)
Abs Immature Granulocytes: 0.04 10*3/uL (ref 0.00–0.07)
Basophils Absolute: 0.1 10*3/uL (ref 0.0–0.1)
Basophils Relative: 1 %
Eosinophils Absolute: 0.1 10*3/uL (ref 0.0–0.5)
Eosinophils Relative: 2 %
HCT: 38.4 % — ABNORMAL LOW (ref 39.0–52.0)
Hemoglobin: 12.4 g/dL — ABNORMAL LOW (ref 13.0–17.0)
Immature Granulocytes: 1 %
Lymphocytes Relative: 20 %
Lymphs Abs: 1.4 10*3/uL (ref 0.7–4.0)
MCH: 27.1 pg (ref 26.0–34.0)
MCHC: 32.3 g/dL (ref 30.0–36.0)
MCV: 83.8 fL (ref 80.0–100.0)
Monocytes Absolute: 0.8 10*3/uL (ref 0.1–1.0)
Monocytes Relative: 12 %
Neutro Abs: 4.7 10*3/uL (ref 1.7–7.7)
Neutrophils Relative %: 64 %
Platelet Count: 243 10*3/uL (ref 150–400)
RBC: 4.58 MIL/uL (ref 4.22–5.81)
RDW: 14.5 % (ref 11.5–15.5)
WBC Count: 7.1 10*3/uL (ref 4.0–10.5)
nRBC: 0 % (ref 0.0–0.2)

## 2020-02-28 LAB — TSH: TSH: 1.286 u[IU]/mL (ref 0.320–4.118)

## 2020-02-28 MED ORDER — SODIUM CHLORIDE 0.9 % IV SOLN
Freq: Once | INTRAVENOUS | Status: AC
  Filled 2020-02-28: qty 250

## 2020-02-28 MED ORDER — SODIUM CHLORIDE 0.9 % IV SOLN
350.0000 mg | Freq: Once | INTRAVENOUS | Status: AC
  Administered 2020-02-28: 350 mg via INTRAVENOUS
  Filled 2020-02-28: qty 7

## 2020-02-28 MED ORDER — SODIUM CHLORIDE 0.9% FLUSH
10.0000 mL | INTRAVENOUS | Status: DC | PRN
  Administered 2020-02-28: 10 mL
  Filled 2020-02-28: qty 10

## 2020-02-28 MED ORDER — HEPARIN SOD (PORK) LOCK FLUSH 100 UNIT/ML IV SOLN
500.0000 [IU] | Freq: Once | INTRAVENOUS | Status: AC | PRN
  Administered 2020-02-28: 500 [IU]
  Filled 2020-02-28: qty 5

## 2020-02-28 NOTE — Progress Notes (Signed)
[facility] [phone]   [phone]  OFFICE PROGRESS NOTE  [name], MD [address] [place]  [id number]  DIAGNOSIS: Metastatic non-small cell lung cancer initially diagnosed as stage IIB (T1b, N1, M0) non-small cell lung cancer, invasive poorly differentiated carcinoma diagnosed in June 2021 with disease recurrence in November 2021 Molecular studies are negative for EGFR mutation as well as ALK gene translocation.  PD-L1 expression was 50-100%.  This is based on the report from the [facility] trial. Molecular studies by [employer]: Showed no actionable mutations and PD-L1 expression was 90%  PRIOR THERAPY:  1) Status post right upper lobectomy with lymph node dissection on June 17, 2019 under the care of Dr. Roxan Hockey. 2) Adjuvant systemic chemotherapy with cisplatin 75 mg/M2 and Alimta 500 mg/M2 every 3 weeks.  First dose September 02, 2019. Status post 3 cycles.  His treatment was discontinued secondary to intolerance.  CURRENT THERAPY: First-line treatment with immunotherapy with Libtayo (Cempilimab) 350 mg IV every 3 weeks status post 1 cycle.  INTERVAL HISTORY: Luis [name] 70 y.o. male returns to the clinic today for follow-up visit accompanied by his wife.  The patient is feeling well today with no concerning complaints.  The pain on the right side of the chest has improved a lot since the beginning of his treatment.  He has a day of pyrexia after starting his treatment but this is improved with Tylenol and Benadryl.  He denied having any current chest pain, shortness of breath but has mild cough with no hemoptysis.  He continues to have generalized weakness.  He has no nausea, vomiting, diarrhea or constipation.  He has no headache or visual changes.  He is here today for evaluation before starting cycle #2 of his treatment.   MEDICAL HISTORY: Past Medical History:  Diagnosis Date  . Arthritis    through out body  .  Cancer ([place])   . Chronic bronchitis ([place])   . Concussion   . COPD (chronic obstructive pulmonary disease) ([place])   . Dyspnea   . Emphysema lung ([place])   . Fatigue   . Frozen shoulder    LEFT  . GERD (gastroesophageal reflux disease)   . Hard of hearing   . Headache    alot of days, related to spine issues  . Hypertension   . Lumbar radiculopathy 2013  . Peyronie's disease   . Prostatitis 2011   Dr. Gaynelle Arabian  . RLS (restless legs syndrome)   . Shingles 06/13/2018   shingles on back, finished with prednisone, stills has scabs and pain  . Thigh pain    Bilateral Thigh  . Thigh pain 10/13   bilateral  . Tobacco abuse 2010   still uses electronic cig/ emphysema, SOB easily  . Vertigo    per wifes update  . Wears partial dentures    upper    ALLERGIES:  is allergic to ativan [lorazepam], doxycycline, and tramadol.  MEDICATIONS:  Current Outpatient Medications  Medication Sig Dispense Refill  . acetaminophen (TYLENOL) 500 MG tablet Take 1,000 mg by mouth at bedtime as needed for moderate pain.    . Cholecalciferol (VITAMIN D) 125 MCG (5000 UT) CAPS Take 5,000 Units by mouth 3 (three) times a week.    . citalopram (CELEXA) 20 MG tablet Take 1 tablet by mouth daily.    . Fluticasone-Salmeterol (ADVAIR) 100-50 MCG/DOSE AEPB Inhale 2 puffs into the lungs 2 (two) times daily.    [name]  gabapentin (NEURONTIN) 100 MG capsule 1-2 capsules    . guaiFENesin (MUCINEX) 600 MG 12 hr tablet 1 tablet as needed    . oxyCODONE-acetaminophen (PERCOCET/ROXICET) 5-325 MG tablet Take 1 tablet by mouth every 6 (six) hours as needed for severe pain. (Patient not taking: No sig reported) 30 tablet 0  . pantoprazole (PROTONIX) 20 MG tablet 1 tablet    . Tiotropium Bromide-Olodaterol (STIOLTO RESPIMAT) 2.5-2.5 MCG/ACT AERS Inhale 2 puffs into the lungs daily. 4 g 11  . valsartan-hydrochlorothiazide (DIOVAN-HCT) 80-12.5 MG tablet Take 1 tablet by mouth daily.    . vitamin B-12 (CYANOCOBALAMIN) 500 MCG  tablet 1 tablet     No current facility-administered medications for this visit.    SURGICAL HISTORY:  Past Surgical History:  Procedure Laterality Date  . CATARACT EXTRACTION W/PHACO Right 07/08/2018   Procedure: CATARACT EXTRACTION PHACO AND INTRAOCULAR LENS PLACEMENT ([place]) RIGHT;  Surgeon: Leandrew Koyanagi, MD;  Location: [facility];  Service: Ophthalmology;  Laterality: Right;  . CATARACT EXTRACTION W/PHACO Left 07/29/2018   Procedure: CATARACT EXTRACTION PHACO AND INTRAOCULAR LENS PLACEMENT ([place]) LEFT TORIC LENS;  Surgeon: Leandrew Koyanagi, MD;  Location: [facility];  Service: Ophthalmology;  Laterality: Left;  . CHEST TUBE INSERTION Right 07/01/2019   Procedure: CHEST TUBE INSERTION;  Surgeon: [name], MD;  Location: [facility];  Service: Thoracic;  Laterality: Right;  . COLONOSCOPY    . EYE SURGERY    . INTERCOSTAL NERVE BLOCK Right 06/17/2019   Procedure: INTERCOSTAL NERVE BLOCK;  Surgeon: [name], MD;  Location: [facility];  Service: Thoracic;  Laterality: Right;  . INTERCOSTAL NERVE BLOCK Right 07/01/2019   Procedure: INTERCOSTAL NERVE BLOCK;  Surgeon: [name], MD;  Location: [facility];  Service: Thoracic;  Laterality: Right;  . IR IMAGING GUIDED PORT INSERTION  02/21/2020  . TONSILLECTOMY    . VIDEO ASSISTED THORACOSCOPY Right 07/01/2019   Procedure: VIDEO ASSISTED THORACOSCOPY - REMOVAL OF RIGHT MIDDLE LOBE BLEBS;  Surgeon: [name], MD;  Location: [facility];  Service: Thoracic;  Laterality: Right;  [name] VIDEO BRONCHOSCOPY WITH INSERTION OF INTERBRONCHIAL VALVE (IBV) Right 06/25/2019   Procedure: VIDEO BRONCHOSCOPY WITH INSERTION OF INTERBRONCHIAL VALVE (IBV); Size 7 IBV into Right Loer Lobe (RLL) Superior Segment, Size 9 IBV into RLL Basilar Segment;  Surgeon: [name], MD;  Location: [facility];  Service: Thoracic;  Laterality: Right;  [name] VIDEO BRONCHOSCOPY WITH INSERTION OF INTERBRONCHIAL VALVE (IBV) N/A 08/06/2019    Procedure: VIDEO BRONCHOSCOPY WITH REMOVAL OF INTERBRONCHIAL VALVE (IBV);  Surgeon: [name], MD;  Location: [facility] OR;  Service: Thoracic;  Laterality: N/A;    REVIEW OF SYSTEMS:  A comprehensive review of systems was negative except for: Constitutional: positive for fatigue Respiratory: positive for cough   PHYSICAL EXAMINATION: General appearance: alert, cooperative, fatigued and no distress Head: Normocephalic, without obvious abnormality, atraumatic Neck: no adenopathy, no JVD, supple, symmetrical, trachea midline and thyroid not enlarged, symmetric, no tenderness/mass/nodules Lymph nodes: Cervical, supraclavicular, and axillary nodes normal. Resp: clear to auscultation bilaterally Back: symmetric, no curvature. ROM normal. No CVA tenderness. Cardio: regular rate and rhythm, S1, S2 normal, no murmur, click, rub or gallop GI: soft, non-tender; bowel sounds normal; no masses,  no organomegaly Extremities: extremities normal, atraumatic, no cyanosis or edema  ECOG PERFORMANCE STATUS: 1 - Symptomatic but completely ambulatory  Blood pressure (!) 154/85, pulse 89, temperature 98.4 F (36.9 C), temperature source Tympanic, resp. rate 16, height 5' 6"  (1.676 m), weight 132 lb 11.2  oz (60.2 kg), SpO2 99 %.  LABORATORY DATA: Lab Results  Component Value Date   WBC 5.9 02/21/2020   HGB 11.3 (L) 02/21/2020   HCT 35.6 (L) 02/21/2020   MCV 85.6 02/21/2020   PLT 347 02/21/2020      Chemistry      Component Value Date/Time   NA 138 02/04/2020 1156   K 4.0 02/04/2020 1156   CL 102 02/04/2020 1156   CO2 27 02/04/2020 1156   BUN 17 02/04/2020 1156   CREATININE 1.19 02/04/2020 1156      Component Value Date/Time   CALCIUM 9.4 02/04/2020 1156   ALKPHOS 64 02/04/2020 1156   AST 8 (L) 02/04/2020 1156   ALT 6 02/04/2020 1156   BILITOT 0.4 02/04/2020 1156       RADIOGRAPHIC STUDIES: DG Abd 2 Views  Result Date: 02/06/2020 CLINICAL DATA:  Constipation, abdominal pain EXAM:  ABDOMEN - 2 VIEW COMPARISON:  None. FINDINGS: Nonobstructive bowel gas pattern. No evidence of free air under the diaphragm on the upright view. Moderate stool in the right colon. Visualized osseous structures are within normal limits. Patchy opacities in the right lower lung, poorly evaluated, with associated small right pleural effusion. IMPRESSION: No evidence of small bowel obstruction or free air. Moderate stool in the right colon. Electronically Signed   By: Julian Hy M.D.   On: 02/06/2020 09:06   IR IMAGING GUIDED PORT INSERTION  Result Date: 02/21/2020 CLINICAL DATA:  Metastatic lung cancer EXAM: RIGHT INTERNAL JUGULAR SINGLE LUMEN POWER PORT CATHETER INSERTION Date:  02/21/2020 02/21/2020 10:24 am Radiologist:  M. Daryll Brod, MD Guidance:  Ultrasound and fluoroscopic MEDICATIONS: Ancef 2 g; The antibiotic was administered within an appropriate time interval prior to skin puncture. ANESTHESIA/SEDATION: Versed 2.0 mg IV; Fentanyl 100 mcg IV; Moderate Sedation Time:  23 minutes The patient was continuously monitored during the procedure by the interventional radiology nurse under my direct supervision. FLUOROSCOPY TIME:  0 minutes, 42 seconds (4 mGy) COMPLICATIONS: None immediate. CONTRAST:  None PROCEDURE: Informed consent was obtained from the patient following explanation of the procedure, risks, benefits and alternatives. The patient understands, agrees and consents for the procedure. All questions were addressed. A time out was performed. Maximal barrier sterile technique utilized including caps, mask, sterile gowns, sterile gloves, large sterile drape, hand hygiene, and 2% chlorhexidine scrub. Under sterile conditions and local anesthesia, right internal jugular micropuncture venous access was performed. Access was performed with ultrasound. Images were obtained for documentation. A guide wire was inserted followed by a transitional dilator. This allowed insertion of a guide wire and catheter  into the IVC. Measurements were obtained from the SVC / RA junction back to the right IJ venotomy site. In the right infraclavicular chest, a subcutaneous pocket was created over the second anterior rib. This was done under sterile conditions and local anesthesia. 1% lidocaine with epinephrine was utilized for this. A 2.5 cm incision was made in the skin. Blunt dissection was performed to create a subcutaneous pocket over the right pectoralis major muscle. The pocket was flushed with saline vigorously. There was adequate hemostasis. The port catheter was assembled and checked for leakage. The port catheter was secured in the pocket with two retention sutures. The tubing was tunneled subcutaneously to the right venotomy site and inserted into the SVC/RA junction through a valved peel-away sheath. Position was confirmed with fluoroscopy. Images were obtained for documentation. The patient tolerated the procedure well. No immediate complications. Incisions were closed in a two layer fashion  with 4 - 0 Vicryl suture. Dermabond was applied to the skin. The port catheter was accessed, blood was aspirated followed by saline and heparin flushes. Needle was removed. A dry sterile dressing was applied. IMPRESSION: Ultrasound and fluoroscopically guided right internal jugular single lumen power port catheter insertion. Tip in the SVC/RA junction. Catheter ready for use. Electronically Signed   By: Jerilynn Mages.  Shick M.D.   On: 02/21/2020 10:30    ASSESSMENT AND PLAN: This is a very pleasant 70 years old white male recently diagnosed with stage IIb (T1b, N1, M0) non-small cell lung cancer, adenocarcinoma status post right upper lobectomy with lymph node dissection under the care of Dr. Roxan Hockey on June 17, 2019. The patient had molecular studies on the [facility] clinical trial that showed negative for EGFR mutation as well as ALK gene translocation but PD-L1 expression in the range of 50-100%. The patient underwent standard  adjuvant systemic chemotherapy with cisplatin 75 mg/M2 and Alimta 500 mg/M2 every 3 weeks status post 3 cycles.  His treatment was discontinued secondary to intolerance. Unfortunately repeat CT scan as well as PET scan after completion of the adjuvant chemotherapy showed concerning findings for disease recurrence with small right pleural effusion as well as multiple pleural implants consistent with metastatic disease which was confirmed with repeat biopsy. He had molecular studies by [employer] and the blood test showed no actionable mutation.  His PD-L1 expression was 90%. The patient is currently undergoing treatment with immunotherapy with Libtayo (Cempilimab) 350 mg IV every 3 weeks status post 1 cycle. The patient tolerated the first round of his treatment well with no concerning complaints except for one episode of pyrexia improved with Tylenol and Benadryl. I recommended for him to proceed with cycle #2 today as planned. I will see the patient back for follow-up visit in 3 weeks for evaluation before the next cycle of his treatment. He was advised to call immediately if he has any concerning symptoms in the interval. The patient voices understanding of current disease status and treatment options and is in agreement with the current care plan.  All questions were answered. The patient knows to call the clinic with any problems, questions or concerns. We can certainly see the patient much sooner if necessary.  Disclaimer: This note was dictated with voice recognition software. Similar sounding words can inadvertently be transcribed and may not be corrected upon review.

## 2020-02-28 NOTE — Patient Instructions (Signed)
[facility] Discharge Instructions for Patients Receiving Chemotherapy  Today you received the following chemotherapy agents Libtayo  To help prevent nausea and vomiting after your treatment, we encourage you to take your nausea medication as directed.    If you develop nausea and vomiting that is not controlled by your nausea medication, call the clinic.   BELOW ARE SYMPTOMS THAT SHOULD BE REPORTED IMMEDIATELY:  *FEVER GREATER THAN 100.5 F  *CHILLS WITH OR WITHOUT FEVER  NAUSEA AND VOMITING THAT IS NOT CONTROLLED WITH YOUR NAUSEA MEDICATION  *UNUSUAL SHORTNESS OF BREATH  *UNUSUAL BRUISING OR BLEEDING  TENDERNESS IN MOUTH AND THROAT WITH OR WITHOUT PRESENCE OF ULCERS  *URINARY PROBLEMS  *BOWEL PROBLEMS  UNUSUAL RASH Items with * indicate a potential emergency and should be followed up as soon as possible.  Feel free to call the clinic should you have any questions or concerns. The clinic phone number is [phone].  Please show the [facility] at check-in to the Emergency Department and triage nurse.  Cemiplimab injection What is this medicine? CEMIPLIMAB (se mip li mab) is a monoclonal antibody. It treats certain types of cancer. Some of the cancers treated are cutaneous squamous cell carcinoma and basal cell carcinoma. This medicine may be used for other purposes; ask your health care provider or pharmacist if you have questions. COMMON BRAND NAME(S): LIBTAYO What should I tell my health care provider before I take this medicine? They need to know if you have any of these conditions:  autoimmune diseases like Crohn's disease, ulcerative colitis, or lupus  have had or planning to have an allogeneic stem cell transplant (uses someone else's stem cells)  history of organ transplant  nervous system problems like myasthenia gravis or Guillain-Barre syndrome  an unusual or allergic reaction to cemiplimab, other drugs, foods, dyes, or  preservatives  pregnant or trying to get pregnant  breast-feeding How should I use this medicine? This medicine is for infusion into a vein. It is given by a health care professional in a hospital or clinic setting. A special MedGuide will be given to you before each treatment. Be sure to read this information carefully each time. Talk to your pediatrician regarding the use of this medicine in children. Special care may be needed. Overdosage: If you think you have taken too much of this medicine contact a poison control center or emergency room at once. NOTE: This medicine is only for you. Do not share this medicine with others. What if I miss a dose? It is important not to miss your dose. Call your doctor or health care professional if you are unable to keep an appointment. What may interact with this medicine? Interactions have not been studied. This list may not describe all possible interactions. Give your health care provider a list of all the medicines, herbs, non-prescription drugs, or dietary supplements you use. Also tell them if you smoke, drink alcohol, or use illegal drugs. Some items may interact with your medicine. What should I watch for while using this medicine? Your condition will be monitored carefully while you are receiving this medicine. You may need blood work done while you are taking this medicine. Do not become pregnant while taking this medicine or for at least 4 months after stopping it. Women should inform their doctor if they wish to become pregnant or think they might be pregnant. There is a potential for serious side effects to an unborn child. Talk to your health care professional or pharmacist  for more information. Do not breast-feed an infant while taking this medicine or for at least 4 months after the last dose. What side effects may I notice from receiving this medicine? Side effects that you should report to your doctor or health care professional as soon  as possible:  allergic reactions like skin rash, itching or hives; swelling of the face, lips, or tongue  black, tarry stools  bloody or watery diarrhea  breathing problems  changes in vision  changes in voice  chest pain or chest tightness  chills  cough  dizziness  fast or irregular heart beat  feeling faint or lightheaded  hair loss  increased hunger or thirst  muscle weakness  persistent headache  redness, blistering, peeling or loosening of the skin, including inside the mouth  signs and symptoms of kidney injury like trouble passing urine or change in the amount of urine  signs and symptoms of liver injury like dark yellow or brown urine; general ill feeling or flu-like symptoms; light-colored stools; loss of appetite; nausea; right upper belly pain; unusually weak or tired; yellowing of the eyes or skin  stomach pain  unusual bleeding or bruising  weight gain or weight loss  unusual sweating Side effects that usually do not require medical attention (report these to your doctor or health care professional if they continue or are bothersome):  bone pain  constipation  muscle pain  tiredness This list may not describe all possible side effects. Call your doctor for medical advice about side effects. You may report side effects to FDA at 1-800-FDA-1088. Where should I keep my medicine? This drug is given in a hospital or clinic and will not be stored at home. NOTE: This sheet is a summary. It may not cover all possible information. If you have questions about this medicine, talk to your doctor, pharmacist, or health care provider.  2021 Elsevier/Gold Standard (2019-03-02 10:47:00)

## 2020-02-29 ENCOUNTER — Telehealth: Payer: Self-pay | Admitting: Physician Assistant

## 2020-02-29 NOTE — Telephone Encounter (Signed)
Scheduled appts per 2/14 los. Pt to get updated appt calendar at next visit per appt notes.

## 2020-03-02 ENCOUNTER — Encounter: Payer: Self-pay | Admitting: Internal Medicine

## 2020-03-06 ENCOUNTER — Ambulatory Visit: Payer: PRIVATE HEALTH INSURANCE

## 2020-03-06 ENCOUNTER — Other Ambulatory Visit: Payer: PRIVATE HEALTH INSURANCE

## 2020-03-06 ENCOUNTER — Ambulatory Visit: Payer: PRIVATE HEALTH INSURANCE | Admitting: Internal Medicine

## 2020-03-10 ENCOUNTER — Inpatient Hospital Stay: Payer: Medicare Other | Admitting: General Practice

## 2020-03-10 DIAGNOSIS — C3491 Malignant neoplasm of unspecified part of right bronchus or lung: Secondary | ICD-10-CM

## 2020-03-10 NOTE — Progress Notes (Signed)
[facility] Social Work  Clinical Social Work was referred by [facility]  to review and complete healthcare advance directives.  Clinical Social Worker met with patient and spouse in [place] office.  The patient designated Masao Junker as their primary healthcare agent and Malakhai Beitler as their secondary agent.  Patient also completed healthcare living will.    Clinical Social Worker notarized documents and made copies for patient/family. Clinical Social Worker will send documents to medical records to be scanned into patient's chart. Clinical Social Worker encouraged patient/family to contact with any additional questions or concerns.  Edwyna Shell, LCSW Clinical Social Worker Phone:  [phone]

## 2020-03-13 DIAGNOSIS — J441 Chronic obstructive pulmonary disease with (acute) exacerbation: Secondary | ICD-10-CM | POA: Diagnosis not present

## 2020-03-13 DIAGNOSIS — J449 Chronic obstructive pulmonary disease, unspecified: Secondary | ICD-10-CM | POA: Diagnosis not present

## 2020-03-13 DIAGNOSIS — K219 Gastro-esophageal reflux disease without esophagitis: Secondary | ICD-10-CM | POA: Diagnosis not present

## 2020-03-20 ENCOUNTER — Other Ambulatory Visit: Payer: Self-pay

## 2020-03-20 ENCOUNTER — Inpatient Hospital Stay (HOSPITAL_BASED_OUTPATIENT_CLINIC_OR_DEPARTMENT_OTHER): Payer: Medicare Other | Admitting: Internal Medicine

## 2020-03-20 ENCOUNTER — Other Ambulatory Visit: Payer: Self-pay | Admitting: Medical Oncology

## 2020-03-20 ENCOUNTER — Inpatient Hospital Stay: Payer: Medicare Other | Attending: Internal Medicine

## 2020-03-20 ENCOUNTER — Inpatient Hospital Stay: Payer: Medicare Other

## 2020-03-20 ENCOUNTER — Encounter: Payer: Self-pay | Admitting: Internal Medicine

## 2020-03-20 VITALS — BP 126/80 | HR 106 | Temp 97.7°F | Resp 14 | Ht 66.0 in | Wt 132.1 lb

## 2020-03-20 DIAGNOSIS — C3411 Malignant neoplasm of upper lobe, right bronchus or lung: Secondary | ICD-10-CM | POA: Insufficient documentation

## 2020-03-20 DIAGNOSIS — C3490 Malignant neoplasm of unspecified part of unspecified bronchus or lung: Secondary | ICD-10-CM

## 2020-03-20 DIAGNOSIS — K219 Gastro-esophageal reflux disease without esophagitis: Secondary | ICD-10-CM | POA: Diagnosis not present

## 2020-03-20 DIAGNOSIS — Z95828 Presence of other vascular implants and grafts: Secondary | ICD-10-CM | POA: Diagnosis not present

## 2020-03-20 DIAGNOSIS — M1990 Unspecified osteoarthritis, unspecified site: Secondary | ICD-10-CM | POA: Insufficient documentation

## 2020-03-20 DIAGNOSIS — Z79899 Other long term (current) drug therapy: Secondary | ICD-10-CM | POA: Insufficient documentation

## 2020-03-20 DIAGNOSIS — J449 Chronic obstructive pulmonary disease, unspecified: Secondary | ICD-10-CM | POA: Insufficient documentation

## 2020-03-20 DIAGNOSIS — Z5112 Encounter for antineoplastic immunotherapy: Secondary | ICD-10-CM

## 2020-03-20 DIAGNOSIS — F17210 Nicotine dependence, cigarettes, uncomplicated: Secondary | ICD-10-CM | POA: Insufficient documentation

## 2020-03-20 DIAGNOSIS — C3491 Malignant neoplasm of unspecified part of right bronchus or lung: Secondary | ICD-10-CM | POA: Diagnosis not present

## 2020-03-20 DIAGNOSIS — R5382 Chronic fatigue, unspecified: Secondary | ICD-10-CM

## 2020-03-20 DIAGNOSIS — I10 Essential (primary) hypertension: Secondary | ICD-10-CM | POA: Insufficient documentation

## 2020-03-20 LAB — CBC WITH DIFFERENTIAL (CANCER CENTER ONLY)
Abs Immature Granulocytes: 0.03 10*3/uL (ref 0.00–0.07)
Basophils Absolute: 0.1 10*3/uL (ref 0.0–0.1)
Basophils Relative: 1 %
Eosinophils Absolute: 0.1 10*3/uL (ref 0.0–0.5)
Eosinophils Relative: 1 %
HCT: 40.8 % (ref 39.0–52.0)
Hemoglobin: 13.2 g/dL (ref 13.0–17.0)
Immature Granulocytes: 0 %
Lymphocytes Relative: 19 %
Lymphs Abs: 1.7 10*3/uL (ref 0.7–4.0)
MCH: 26.8 pg (ref 26.0–34.0)
MCHC: 32.4 g/dL (ref 30.0–36.0)
MCV: 82.9 fL (ref 80.0–100.0)
Monocytes Absolute: 0.6 10*3/uL (ref 0.1–1.0)
Monocytes Relative: 6 %
Neutro Abs: 6.4 10*3/uL (ref 1.7–7.7)
Neutrophils Relative %: 73 %
Platelet Count: 253 10*3/uL (ref 150–400)
RBC: 4.92 MIL/uL (ref 4.22–5.81)
RDW: 14.8 % (ref 11.5–15.5)
WBC Count: 8.8 10*3/uL (ref 4.0–10.5)
nRBC: 0 % (ref 0.0–0.2)

## 2020-03-20 LAB — CMP (CANCER CENTER ONLY)
ALT: 13 U/L (ref 0–44)
AST: 15 U/L (ref 15–41)
Albumin: 4.2 g/dL (ref 3.5–5.0)
Alkaline Phosphatase: 64 U/L (ref 38–126)
Anion gap: 7 (ref 5–15)
BUN: 14 mg/dL (ref 8–23)
CO2: 27 mmol/L (ref 22–32)
Calcium: 9.4 mg/dL (ref 8.9–10.3)
Chloride: 103 mmol/L (ref 98–111)
Creatinine: 1.05 mg/dL (ref 0.61–1.24)
GFR, Estimated: >60 mL/min (ref >60)
Glucose, Bld: 85 mg/dL (ref 70–99)
Potassium: 4.2 mmol/L (ref 3.5–5.1)
Sodium: 137 mmol/L (ref 135–145)
Total Bilirubin: 0.4 mg/dL (ref 0.3–1.2)
Total Protein: 7.2 g/dL (ref 6.5–8.1)

## 2020-03-20 LAB — TSH: TSH: 1.823 u[IU]/mL (ref 0.320–4.118)

## 2020-03-20 MED ORDER — SODIUM CHLORIDE 0.9 % IV SOLN
Freq: Once | INTRAVENOUS | Status: AC
  Filled 2020-03-20: qty 250

## 2020-03-20 MED ORDER — SODIUM CHLORIDE 0.9 % IV SOLN
350.0000 mg | Freq: Once | INTRAVENOUS | Status: AC
  Administered 2020-03-20: 350 mg via INTRAVENOUS
  Filled 2020-03-20: qty 7

## 2020-03-20 MED ORDER — SODIUM CHLORIDE 0.9% FLUSH
10.0000 mL | INTRAVENOUS | Status: DC | PRN
Start: 2020-03-20 — End: 2020-03-20
  Administered 2020-03-20: 10 mL
  Filled 2020-03-20: qty 10

## 2020-03-20 MED ORDER — LIDOCAINE-PRILOCAINE 2.5-2.5 % EX CREA: 1.0000 "application " | TOPICAL_CREAM | CUTANEOUS | 0 refills | Status: DC | PRN

## 2020-03-20 MED ORDER — HEPARIN SOD (PORK) LOCK FLUSH 100 UNIT/ML IV SOLN
500.0000 [IU] | Freq: Once | INTRAVENOUS | Status: AC | PRN
  Administered 2020-03-20: 500 [IU]
  Filled 2020-03-20: qty 5

## 2020-03-20 MED ORDER — SODIUM CHLORIDE 0.9% FLUSH
10.0000 mL | INTRAVENOUS | Status: AC
  Administered 2020-03-20: 10 mL via INTRAVENOUS
  Filled 2020-03-20: qty 10

## 2020-03-20 NOTE — Progress Notes (Signed)
[facility] Telephone:([phone]) [phone]   Fax:([phone]) [phone]  OFFICE PROGRESS NOTE  [name], MD [address] [place] [address]  DIAGNOSIS: Metastatic non-small cell lung cancer initially diagnosed as stage IIB (T1b, N1, M0) non-small cell lung cancer, invasive poorly differentiated carcinoma diagnosed in June 2021 with disease recurrence in November 2021 Molecular studies are negative for EGFR mutation as well as ALK gene translocation.  PD-L1 expression was 50-100%.  This is based on the report from the [facility] trial. Molecular studies by [employer]: Showed no actionable mutations and PD-L1 expression was 90%  PRIOR THERAPY:  1) Status post right upper lobectomy with lymph node dissection on June 17, 2019 under the care of Dr. Roxan Hockey. 2) Adjuvant systemic chemotherapy with cisplatin 75 mg/M2 and Alimta 500 mg/M2 every 3 weeks.  First dose September 02, 2019. Status post 3 cycles.  His treatment was discontinued secondary to intolerance.  CURRENT THERAPY: First-line treatment with immunotherapy with Libtayo (Cempilimab) 350 mg IV every 3 weeks status post 2 cycles.  INTERVAL HISTORY: Luis [name] 70 y.o. male returns to the clinic today for follow-up visit.  The patient is feeling fine today with no concerning complaints.  He mentions that few weeks ago he woke up 1 day and felt very well and since that time he has been doing fine with no concerning complaints.  He denied having any current chest pain, shortness of breath, cough or hemoptysis.  He denied having any fever or chills.  He has no nausea, vomiting, diarrhea or constipation.  He has no headache or visual changes.  He is here today for evaluation before starting cycle #3 of his treatment.   MEDICAL HISTORY: Past Medical History:  Diagnosis Date  . Arthritis    through out body  . Cancer ([place])   . Chronic bronchitis ([place])   . Concussion   . COPD (chronic obstructive pulmonary  disease) ([place])   . Dyspnea   . Emphysema lung ([place])   . Fatigue   . Frozen shoulder    LEFT  . GERD (gastroesophageal reflux disease)   . Hard of hearing   . Headache    alot of days, related to spine issues  . Hypertension   . Lumbar radiculopathy 2013  . Peyronie's disease   . Prostatitis 2011   Dr. Gaynelle Arabian  . RLS (restless legs syndrome)   . Shingles 06/13/2018   shingles on back, finished with prednisone, stills has scabs and pain  . Thigh pain    Bilateral Thigh  . Thigh pain 10/13   bilateral  . Tobacco abuse 2010   still uses electronic cig/ emphysema, SOB easily  . Vertigo    per wifes update  . Wears partial dentures    upper    ALLERGIES:  is allergic to ativan [lorazepam], doxycycline, and tramadol.  MEDICATIONS:  Current Outpatient Medications  Medication Sig Dispense Refill  . acetaminophen (TYLENOL) 500 MG tablet Take 1,000 mg by mouth at bedtime as needed for moderate pain.    . Cholecalciferol (VITAMIN D) 125 MCG (5000 UT) CAPS Take 5,000 Units by mouth 3 (three) times a week.    . citalopram (CELEXA) 20 MG tablet Take 1 tablet by mouth daily.    . Fluticasone-Salmeterol (ADVAIR) 100-50 MCG/DOSE AEPB Inhale 2 puffs into the lungs 2 (two) times daily.    . Tiotropium Bromide-Olodaterol (STIOLTO RESPIMAT) 2.5-2.5 MCG/ACT AERS Inhale 2 puffs into the lungs daily. 4 g  11  . valsartan-hydrochlorothiazide (DIOVAN-HCT) 80-12.5 MG tablet Take 1 tablet by mouth daily.    . vitamin B-12 (CYANOCOBALAMIN) 500 MCG tablet 1 tablet     No current facility-administered medications for this visit.    SURGICAL HISTORY:  Past Surgical History:  Procedure Laterality Date  . CATARACT EXTRACTION W/PHACO Right 07/08/2018   Procedure: CATARACT EXTRACTION PHACO AND INTRAOCULAR LENS PLACEMENT ([place]) RIGHT;  Surgeon: Leandrew Koyanagi, MD;  Location: [facility];  Service: Ophthalmology;  Laterality: Right;  . CATARACT EXTRACTION W/PHACO Left 07/29/2018    Procedure: CATARACT EXTRACTION PHACO AND INTRAOCULAR LENS PLACEMENT ([place]) LEFT TORIC LENS;  Surgeon: Leandrew Koyanagi, MD;  Location: [facility];  Service: Ophthalmology;  Laterality: Left;  . CHEST TUBE INSERTION Right 07/01/2019   Procedure: CHEST TUBE INSERTION;  Surgeon: [name], MD;  Location: [facility];  Service: Thoracic;  Laterality: Right;  . COLONOSCOPY    . EYE SURGERY    . INTERCOSTAL NERVE BLOCK Right 06/17/2019   Procedure: INTERCOSTAL NERVE BLOCK;  Surgeon: [name], MD;  Location: [facility];  Service: Thoracic;  Laterality: Right;  . INTERCOSTAL NERVE BLOCK Right 07/01/2019   Procedure: INTERCOSTAL NERVE BLOCK;  Surgeon: [name], MD;  Location: [facility];  Service: Thoracic;  Laterality: Right;  . IR IMAGING GUIDED PORT INSERTION  02/21/2020  . TONSILLECTOMY    . VIDEO ASSISTED THORACOSCOPY Right 07/01/2019   Procedure: VIDEO ASSISTED THORACOSCOPY - REMOVAL OF RIGHT MIDDLE LOBE BLEBS;  Surgeon: [name], MD;  Location: [facility];  Service: Thoracic;  Laterality: Right;  [name] VIDEO BRONCHOSCOPY WITH INSERTION OF INTERBRONCHIAL VALVE (IBV) Right 06/25/2019   Procedure: VIDEO BRONCHOSCOPY WITH INSERTION OF INTERBRONCHIAL VALVE (IBV); Size 7 IBV into Right Loer Lobe (RLL) Superior Segment, Size 9 IBV into RLL Basilar Segment;  Surgeon: [name], MD;  Location: [facility];  Service: Thoracic;  Laterality: Right;  [name] VIDEO BRONCHOSCOPY WITH INSERTION OF INTERBRONCHIAL VALVE (IBV) N/A 08/06/2019   Procedure: VIDEO BRONCHOSCOPY WITH REMOVAL OF INTERBRONCHIAL VALVE (IBV);  Surgeon: [name], MD;  Location: [facility];  Service: Thoracic;  Laterality: N/A;    REVIEW OF SYSTEMS:  A comprehensive review of systems was negative.   PHYSICAL EXAMINATION: General appearance: alert, cooperative and no distress Head: Normocephalic, without obvious abnormality, atraumatic Neck: no adenopathy, no JVD, supple, symmetrical, trachea midline and  thyroid not enlarged, symmetric, no tenderness/mass/nodules Lymph nodes: Cervical, supraclavicular, and axillary nodes normal. Resp: clear to auscultation bilaterally Back: symmetric, no curvature. ROM normal. No CVA tenderness. Cardio: regular rate and rhythm, S1, S2 normal, no murmur, click, rub or gallop GI: soft, non-tender; bowel sounds normal; no masses,  no organomegaly Extremities: extremities normal, atraumatic, no cyanosis or edema  ECOG PERFORMANCE STATUS: 0 - Asymptomatic  Blood pressure 126/80, pulse (!) 106, temperature 97.7 F (36.5 C), temperature source Tympanic, resp. rate 14, height 5' 6"  (1.676 m), weight 132 lb 1.6 oz (59.9 kg), SpO2 98 %.  LABORATORY DATA: Lab Results  Component Value Date   WBC 7.1 02/28/2020   HGB 12.4 (L) 02/28/2020   HCT 38.4 (L) 02/28/2020   MCV 83.8 02/28/2020   PLT 243 02/28/2020      Chemistry      Component Value Date/Time   NA 136 02/28/2020 1352   K 4.2 02/28/2020 1352   CL 101 02/28/2020 1352   CO2 27 02/28/2020 1352   BUN 15 02/28/2020 1352   CREATININE 0.98 02/28/2020 1352  Component Value Date/Time   CALCIUM 9.5 02/28/2020 1352   ALKPHOS 76 02/28/2020 1352   AST 17 02/28/2020 1352   ALT 10 02/28/2020 1352   BILITOT 0.5 02/28/2020 1352       RADIOGRAPHIC STUDIES: IR IMAGING GUIDED PORT INSERTION  Result Date: 02/21/2020 CLINICAL DATA:  Metastatic lung cancer EXAM: RIGHT INTERNAL JUGULAR SINGLE LUMEN POWER PORT CATHETER INSERTION Date:  02/21/2020 02/21/2020 10:24 am Radiologist:  M. Daryll Brod, MD Guidance:  Ultrasound and fluoroscopic MEDICATIONS: Ancef 2 g; The antibiotic was administered within an appropriate time interval prior to skin puncture. ANESTHESIA/SEDATION: Versed 2.0 mg IV; Fentanyl 100 mcg IV; Moderate Sedation Time:  23 minutes The patient was continuously monitored during the procedure by the interventional radiology nurse under my direct supervision. FLUOROSCOPY TIME:  0 minutes, 42 seconds (4 mGy)  COMPLICATIONS: None immediate. CONTRAST:  None PROCEDURE: Informed consent was obtained from the patient following explanation of the procedure, risks, benefits and alternatives. The patient understands, agrees and consents for the procedure. All questions were addressed. A time out was performed. Maximal barrier sterile technique utilized including caps, mask, sterile gowns, sterile gloves, large sterile drape, hand hygiene, and 2% chlorhexidine scrub. Under sterile conditions and local anesthesia, right internal jugular micropuncture venous access was performed. Access was performed with ultrasound. Images were obtained for documentation. A guide wire was inserted followed by a transitional dilator. This allowed insertion of a guide wire and catheter into the IVC. Measurements were obtained from the SVC / RA junction back to the right IJ venotomy site. In the right infraclavicular chest, a subcutaneous pocket was created over the second anterior rib. This was done under sterile conditions and local anesthesia. 1% lidocaine with epinephrine was utilized for this. A 2.5 cm incision was made in the skin. Blunt dissection was performed to create a subcutaneous pocket over the right pectoralis major muscle. The pocket was flushed with saline vigorously. There was adequate hemostasis. The port catheter was assembled and checked for leakage. The port catheter was secured in the pocket with two retention sutures. The tubing was tunneled subcutaneously to the right venotomy site and inserted into the SVC/RA junction through a valved peel-away sheath. Position was confirmed with fluoroscopy. Images were obtained for documentation. The patient tolerated the procedure well. No immediate complications. Incisions were closed in a two layer fashion with 4 - 0 Vicryl suture. Dermabond was applied to the skin. The port catheter was accessed, blood was aspirated followed by saline and heparin flushes. Needle was removed. A dry  sterile dressing was applied. IMPRESSION: Ultrasound and fluoroscopically guided right internal jugular single lumen power port catheter insertion. Tip in the SVC/RA junction. Catheter ready for use. Electronically Signed   By: Jerilynn Mages.  Shick M.D.   On: 02/21/2020 10:30    ASSESSMENT AND PLAN: This is a very pleasant 70 years old white male recently diagnosed with stage IIb (T1b, N1, M0) non-small cell lung cancer, adenocarcinoma status post right upper lobectomy with lymph node dissection under the care of Dr. Roxan Hockey on June 17, 2019. The patient had molecular studies on the [facility] clinical trial that showed negative for EGFR mutation as well as ALK gene translocation but PD-L1 expression in the range of 50-100%. The patient underwent standard adjuvant systemic chemotherapy with cisplatin 75 mg/M2 and Alimta 500 mg/M2 every 3 weeks status post 3 cycles.  His treatment was discontinued secondary to intolerance. Unfortunately repeat CT scan as well as PET scan after completion of the adjuvant chemotherapy showed concerning  findings for disease recurrence with small right pleural effusion as well as multiple pleural implants consistent with metastatic disease which was confirmed with repeat biopsy. He had molecular studies by [employer] and the blood test showed no actionable mutation.  His PD-L1 expression was 90%. The patient is currently undergoing treatment with immunotherapy with Libtayo (Cempilimab) 350 mg IV every 3 weeks status post 2 cycles. The patient is doing fine today with no concerning complaints. I recommended for him to proceed with cycle #3 today as planned. I will see him back for follow-up visit in 3 weeks for evaluation with repeat CT scan of the chest, abdomen pelvis for restaging of his disease. He was advised to call immediately if he has any other concerning symptoms in the interval. The patient voices understanding of current disease status and treatment options and is in  agreement with the current care plan.  All questions were answered. The patient knows to call the clinic with any problems, questions or concerns. We can certainly see the patient much sooner if necessary.  Disclaimer: This note was dictated with voice recognition software. Similar sounding words can inadvertently be transcribed and may not be corrected upon review.

## 2020-03-20 NOTE — Patient Instructions (Signed)
[facility] Discharge Instructions for Patients Receiving Chemotherapy  Today you received the following chemotherapy agents Libtayo  To help prevent nausea and vomiting after your treatment, we encourage you to take your nausea medication as directed.    If you develop nausea and vomiting that is not controlled by your nausea medication, call the clinic.   BELOW ARE SYMPTOMS THAT SHOULD BE REPORTED IMMEDIATELY:  *FEVER GREATER THAN 100.5 F  *CHILLS WITH OR WITHOUT FEVER  NAUSEA AND VOMITING THAT IS NOT CONTROLLED WITH YOUR NAUSEA MEDICATION  *UNUSUAL SHORTNESS OF BREATH  *UNUSUAL BRUISING OR BLEEDING  TENDERNESS IN MOUTH AND THROAT WITH OR WITHOUT PRESENCE OF ULCERS  *URINARY PROBLEMS  *BOWEL PROBLEMS  UNUSUAL RASH Items with * indicate a potential emergency and should be followed up as soon as possible.  Feel free to call the clinic should you have any questions or concerns. The clinic phone number is [phone].  Please show the [facility] at check-in to the Emergency Department and triage nurse.  Cemiplimab injection What is this medicine? CEMIPLIMAB (se mip li mab) is a monoclonal antibody. It treats certain types of cancer. Some of the cancers treated are cutaneous squamous cell carcinoma and basal cell carcinoma. This medicine may be used for other purposes; ask your health care provider or pharmacist if you have questions. COMMON BRAND NAME(S): LIBTAYO What should I tell my health care provider before I take this medicine? They need to know if you have any of these conditions:  autoimmune diseases like Crohn's disease, ulcerative colitis, or lupus  have had or planning to have an allogeneic stem cell transplant (uses someone else's stem cells)  history of organ transplant  nervous system problems like myasthenia gravis or Guillain-Barre syndrome  an unusual or allergic reaction to cemiplimab, other drugs, foods, dyes, or  preservatives  pregnant or trying to get pregnant  breast-feeding How should I use this medicine? This medicine is for infusion into a vein. It is given by a health care professional in a hospital or clinic setting. A special MedGuide will be given to you before each treatment. Be sure to read this information carefully each time. Talk to your pediatrician regarding the use of this medicine in children. Special care may be needed. Overdosage: If you think you have taken too much of this medicine contact a poison control center or emergency room at once. NOTE: This medicine is only for you. Do not share this medicine with others. What if I miss a dose? It is important not to miss your dose. Call your doctor or health care professional if you are unable to keep an appointment. What may interact with this medicine? Interactions have not been studied. This list may not describe all possible interactions. Give your health care provider a list of all the medicines, herbs, non-prescription drugs, or dietary supplements you use. Also tell them if you smoke, drink alcohol, or use illegal drugs. Some items may interact with your medicine. What should I watch for while using this medicine? Your condition will be monitored carefully while you are receiving this medicine. You may need blood work done while you are taking this medicine. Do not become pregnant while taking this medicine or for at least 4 months after stopping it. Women should inform their doctor if they wish to become pregnant or think they might be pregnant. There is a potential for serious side effects to an unborn child. Talk to your health care professional or pharmacist  for more information. Do not breast-feed an infant while taking this medicine or for at least 4 months after the last dose. What side effects may I notice from receiving this medicine? Side effects that you should report to your doctor or health care professional as soon  as possible:  allergic reactions like skin rash, itching or hives; swelling of the face, lips, or tongue  black, tarry stools  bloody or watery diarrhea  breathing problems  changes in vision  changes in voice  chest pain or chest tightness  chills  cough  dizziness  fast or irregular heart beat  feeling faint or lightheaded  hair loss  increased hunger or thirst  muscle weakness  persistent headache  redness, blistering, peeling or loosening of the skin, including inside the mouth  signs and symptoms of kidney injury like trouble passing urine or change in the amount of urine  signs and symptoms of liver injury like dark yellow or brown urine; general ill feeling or flu-like symptoms; light-colored stools; loss of appetite; nausea; right upper belly pain; unusually weak or tired; yellowing of the eyes or skin  stomach pain  unusual bleeding or bruising  weight gain or weight loss  unusual sweating Side effects that usually do not require medical attention (report these to your doctor or health care professional if they continue or are bothersome):  bone pain  constipation  muscle pain  tiredness This list may not describe all possible side effects. Call your doctor for medical advice about side effects. You may report side effects to FDA at 1-800-FDA-1088. Where should I keep my medicine? This drug is given in a hospital or clinic and will not be stored at home. NOTE: This sheet is a summary. It may not cover all possible information. If you have questions about this medicine, talk to your doctor, pharmacist, or health care provider.  2021 Elsevier/Gold Standard (2019-03-02 10:47:00)

## 2020-03-20 NOTE — Progress Notes (Signed)
Per Dr Julien Nordmann it is okay to treat pt today with Libtayo and Heart rate of 106.

## 2020-03-24 ENCOUNTER — Telehealth: Payer: Self-pay | Admitting: Medical Oncology

## 2020-03-24 NOTE — Telephone Encounter (Signed)
clarified that CT order is for chest /abd and pelvis

## 2020-04-05 ENCOUNTER — Ambulatory Visit
Admission: RE | Admit: 2020-04-05 | Discharge: 2020-04-05 | Disposition: A | Discharge status: Home / Self Care | Payer: Medicare Other | Source: Ambulatory Visit | Attending: Internal Medicine | Admitting: Internal Medicine

## 2020-04-05 ENCOUNTER — Other Ambulatory Visit: Payer: Self-pay

## 2020-04-05 DIAGNOSIS — J90 Pleural effusion, not elsewhere classified: Secondary | ICD-10-CM | POA: Diagnosis not present

## 2020-04-05 DIAGNOSIS — I700 Atherosclerosis of aorta: Secondary | ICD-10-CM | POA: Diagnosis not present

## 2020-04-05 DIAGNOSIS — C3490 Malignant neoplasm of unspecified part of unspecified bronchus or lung: Secondary | ICD-10-CM | POA: Insufficient documentation

## 2020-04-05 DIAGNOSIS — K5750 Diverticulosis of both small and large intestine without perforation or abscess without bleeding: Secondary | ICD-10-CM | POA: Diagnosis not present

## 2020-04-05 DIAGNOSIS — J929 Pleural plaque without asbestos: Secondary | ICD-10-CM | POA: Diagnosis not present

## 2020-04-05 DIAGNOSIS — I2510 Atherosclerotic heart disease of native coronary artery without angina pectoris: Secondary | ICD-10-CM | POA: Diagnosis not present

## 2020-04-05 DIAGNOSIS — N281 Cyst of kidney, acquired: Secondary | ICD-10-CM | POA: Diagnosis not present

## 2020-04-05 DIAGNOSIS — J438 Other emphysema: Secondary | ICD-10-CM | POA: Diagnosis not present

## 2020-04-05 MED ORDER — IOHEXOL 300 MG/ML  SOLN
100.0000 mL | Freq: Once | INTRAMUSCULAR | Status: AC | PRN
  Administered 2020-04-05: 100 mL via INTRAVENOUS

## 2020-04-05 NOTE — Progress Notes (Deleted)
[place] OFFICE PROGRESS NOTE  Luis Huddle, MD [place] [address] [place] [address]  DIAGNOSIS: Metastatic NSCLC, initially diagnosed as Stage IIB(T1b, N1, M0) non-small cell lung cancer, invasive poorly differentiated carcinoma diagnosed in June 2021. He had disease recurrence in November 2021.  Molecular studies are negative for EGFR mutation as well as ALK gene translocation. PD-L1 expression was 50-100%. This isbased on the report from [facility] trial. PD-L1 expression by guardant: 75%.  PRIOR THERAPY: 1) Status post right upper lobectomy with lymph node dissection on June 17, 2019 under the care of Dr. Roxan Hockey. 2) Adjuvant systemic chemotherapy with cisplatin 75 mg/M2 and Alimta 500 mg/M2 every 3 weeks.  First dose September 02, 2019. Status post 3 cycles.  His treatment was discontinued secondary to intolerance.  CURRENT THERAPY: First-line treatment with immunotherapy with Libtayo (Cempilimab) 350 mg IV every 3 weeks status post 3 cycles. First dose on 01/24/20.   INTERVAL HISTORY: Luis [name] 70 y.o. male returns to the clinic for a follow up visit. The patient is feeling fine today without any concerning complaints. The patient continues to tolerate treatment with __ well without any adverse effects. Denies any fever, chills, night sweats, or weight loss. Denies any chest pain, shortness of breath, cough, or hemoptysis. Denies any nausea, vomiting, diarrhea, or constipation. Denies any headache or visual changes. Denies any rashes or skin changes. The patient recently had a restaging CT scan. The patient is here today for evaluation and to review his scan prior to starting cycle # 4    MEDICAL HISTORY: Past Medical History:  Diagnosis Date  . Arthritis    through out body  . Cancer ([place])   . Chronic bronchitis ([facility])   . Concussion   . COPD (chronic obstructive pulmonary disease) ([place])   . Dyspnea   . Emphysema lung ([place])   . Fatigue    . Frozen shoulder    LEFT  . GERD (gastroesophageal reflux disease)   . Hard of hearing   . Headache    alot of days, related to spine issues  . Hypertension   . Lumbar radiculopathy 2013  . Peyronie's disease   . Prostatitis 2011   Dr. Gaynelle Arabian  . RLS (restless legs syndrome)   . Shingles 06/13/2018   shingles on back, finished with prednisone, stills has scabs and pain  . Thigh pain    Bilateral Thigh  . Thigh pain 10/13   bilateral  . Tobacco abuse 2010   still uses electronic cig/ emphysema, SOB easily  . Vertigo    per wifes update  . Wears partial dentures    upper    ALLERGIES:  is allergic to ativan [lorazepam], doxycycline, and tramadol.  MEDICATIONS:  Current Outpatient Medications  Medication Sig Dispense Refill  . acetaminophen (TYLENOL) 500 MG tablet Take 1,000 mg by mouth at bedtime as needed for moderate pain.    . Cholecalciferol (VITAMIN D) 125 MCG (5000 UT) CAPS Take 5,000 Units by mouth 3 (three) times a week.    . citalopram (CELEXA) 20 MG tablet Take 1 tablet by mouth daily.    . Fluticasone-Salmeterol (ADVAIR) 100-50 MCG/DOSE AEPB Inhale 2 puffs into the lungs 2 (two) times daily.    [name] lidocaine-prilocaine (EMLA) cream Apply 1 application topically as needed. Apply 1 tsp over port site at least 45 minutes prior to lab appointment.Do not rub in cream. Cover with plastic wrap. 30 g 0  . Multiple Vitamin (MULTIVITAMIN) tablet Take 1  tablet by mouth daily.    . valsartan-hydrochlorothiazide (DIOVAN-HCT) 80-12.5 MG tablet Take 1 tablet by mouth daily.    . vitamin B-12 (CYANOCOBALAMIN) 500 MCG tablet 1 tablet     No current facility-administered medications for this visit.    SURGICAL HISTORY:  Past Surgical History:  Procedure Laterality Date  . CATARACT EXTRACTION W/PHACO Right 07/08/2018   Procedure: CATARACT EXTRACTION PHACO AND INTRAOCULAR LENS PLACEMENT ([place]) RIGHT;  Surgeon: Leandrew Koyanagi, MD;  Location: [facility];  Service:  Ophthalmology;  Laterality: Right;  . CATARACT EXTRACTION W/PHACO Left 07/29/2018   Procedure: CATARACT EXTRACTION PHACO AND INTRAOCULAR LENS PLACEMENT ([place]) LEFT TORIC LENS;  Surgeon: Leandrew Koyanagi, MD;  Location: [facility];  Service: Ophthalmology;  Laterality: Left;  . CHEST TUBE INSERTION Right 07/01/2019   Procedure: CHEST TUBE INSERTION;  Surgeon: [name], MD;  Location: [facility];  Service: Thoracic;  Laterality: Right;  . COLONOSCOPY    . EYE SURGERY    . INTERCOSTAL NERVE BLOCK Right 06/17/2019   Procedure: INTERCOSTAL NERVE BLOCK;  Surgeon: [name], MD;  Location: [facility];  Service: Thoracic;  Laterality: Right;  . INTERCOSTAL NERVE BLOCK Right 07/01/2019   Procedure: INTERCOSTAL NERVE BLOCK;  Surgeon: [name], MD;  Location: [place];  Service: Thoracic;  Laterality: Right;  . IR IMAGING GUIDED PORT INSERTION  02/21/2020  . TONSILLECTOMY    . VIDEO ASSISTED THORACOSCOPY Right 07/01/2019   Procedure: VIDEO ASSISTED THORACOSCOPY - REMOVAL OF RIGHT MIDDLE LOBE BLEBS;  Surgeon: [name], MD;  Location: [place] [facility];  Service: Thoracic;  Laterality: Right;  [name] VIDEO BRONCHOSCOPY WITH INSERTION OF INTERBRONCHIAL VALVE (IBV) Right 06/25/2019   Procedure: VIDEO BRONCHOSCOPY WITH INSERTION OF INTERBRONCHIAL VALVE (IBV); Size 7 IBV into Right Loer Lobe (RLL) Superior Segment, Size 9 IBV into RLL Basilar Segment;  Surgeon: [name], MD;  Location: [facility];  Service: Thoracic;  Laterality: Right;  [name] VIDEO BRONCHOSCOPY WITH INSERTION OF INTERBRONCHIAL VALVE (IBV) N/A 08/06/2019   Procedure: VIDEO BRONCHOSCOPY WITH REMOVAL OF INTERBRONCHIAL VALVE (IBV);  Surgeon: [name], MD;  Location: [facility];  Service: Thoracic;  Laterality: N/A;    REVIEW OF SYSTEMS:   Review of Systems  Constitutional: Negative for appetite change, chills, fatigue, fever and unexpected weight change.  HENT:   Negative for mouth sores, nosebleeds, sore  throat and trouble swallowing.   Eyes: Negative for eye problems and icterus.  Respiratory: Negative for cough, hemoptysis, shortness of breath and wheezing.   Cardiovascular: Negative for chest pain and leg swelling.  Gastrointestinal: Negative for abdominal pain, constipation, diarrhea, nausea and vomiting.  Genitourinary: Negative for bladder incontinence, difficulty urinating, dysuria, frequency and hematuria.   Musculoskeletal: Negative for back pain, gait problem, neck pain and neck stiffness.  Skin: Negative for itching and rash.  Neurological: Negative for dizziness, extremity weakness, gait problem, headaches, light-headedness and seizures.  Hematological: Negative for adenopathy. Does not bruise/bleed easily.  Psychiatric/Behavioral: Negative for confusion, depression and sleep disturbance. The patient is not nervous/anxious.     PHYSICAL EXAMINATION:  There were no vitals taken for this visit.  ECOG PERFORMANCE STATUS: {CHL ONC ECOG [id number]  Physical Exam  Constitutional: Oriented to person, place, and time and well-developed, well-nourished, and in no distress. No distress.  HENT:  Head: Normocephalic and atraumatic.  Mouth/Throat: Oropharynx is clear and moist. No oropharyngeal exudate.  Eyes: Conjunctivae are normal. Right eye exhibits no discharge. Left eye exhibits no discharge. No scleral icterus.  Neck:  Normal range of motion. Neck supple.  Cardiovascular: Normal rate, regular rhythm, normal heart sounds and intact distal pulses.   Pulmonary/Chest: Effort normal and breath sounds normal. No respiratory distress. No wheezes. No rales.  Abdominal: Soft. Bowel sounds are normal. Exhibits no distension and no mass. There is no tenderness.  Musculoskeletal: Normal range of motion. Exhibits no edema.  Lymphadenopathy:    No cervical adenopathy.  Neurological: Alert and oriented to person, place, and time. Exhibits normal muscle tone. Gait normal. Coordination normal.   Skin: Skin is warm and dry. No rash noted. Not diaphoretic. No erythema. No pallor.  Psychiatric: Mood, memory and judgment normal.  Vitals reviewed.  LABORATORY DATA: Lab Results  Component Value Date   WBC 8.8 03/20/2020   HGB 13.2 03/20/2020   HCT 40.8 03/20/2020   MCV 82.9 03/20/2020   PLT 253 03/20/2020      Chemistry      Component Value Date/Time   NA 137 03/20/2020 1315   K 4.2 03/20/2020 1315   CL 103 03/20/2020 1315   CO2 27 03/20/2020 1315   BUN 14 03/20/2020 1315   CREATININE 1.05 03/20/2020 1315      Component Value Date/Time   CALCIUM 9.4 03/20/2020 1315   ALKPHOS 64 03/20/2020 1315   AST 15 03/20/2020 1315   ALT 13 03/20/2020 1315   BILITOT 0.4 03/20/2020 1315       RADIOGRAPHIC STUDIES:  No results found.   ASSESSMENT/PLAN:  This is a very pleasant 70 year old Caucasian maleinitiallydiagnosed with stage IIb (T1b, N1, M0) non-small cell lung cancer, adenocarcinoma. He is status post a right upper lobe lobectomy with lymph node dissection under the care of Dr. Roxan Hockey. This was performed on June 17, 2019. He has no actionable mutations. His PD-L1 expression was estimated to be in the range of 50 to 100% by the molecular studies performed by the [facility] clinical trial.It was found to be 75% by foundation 1 testing. He was found to have disease recurrence in November 2021.   The patient completed 3 of the 4 planned adjuvant chemotherapy cycles withcisplatin 75 mg per metered square and Alimta 500 mg/m.This was discontinued after cycle #3 due to intolerance.   In November 2021, the patients restaging scans showed concerning findings with disease recurrence small right pleural effusion as well as multiple pleural implants suspicious for metastatic disease. There was no evidence for metastatic disease to the brain.   The patient is currently undergoing treatment with Libtayo (Cempilimab) 350 mg IV every 3 weeks status post 3 cycles.   The  patient recently had a restaging CT scan performed today. Dr. Julien Nordmann personally and independently reviewed the scan and discussed the results with the patient. The scan showed _. Dr. Julien Nordmann recommends _.   Dr. Julien Nordmann recommends that he _ with cycle #4 today as scheduled.   We will see him back for a follow up visit in 3 weeks for evaluation before starting cycle #5.   The patient was advised to call immediately if he has any concerning symptoms in the interval. The patient voices understanding of current disease status and treatment options and is in agreement with the current care plan. All questions were answered. The patient knows to call the clinic with any problems, questions or concerns. We can certainly see the patient much sooner if necessary           No orders of the defined types were placed in this encounter.    I spent {CHL ONC  TIME VISIT - QPRFF:[id number]} counseling the patient face to face. The total time spent in the appointment was {CHL ONC TIME VISIT - TTSVX:[id number]}.  Jaretzy Lhommedieu L Osvaldo Lamping, PA-C 04/05/20

## 2020-04-06 ENCOUNTER — Encounter: Payer: Self-pay | Admitting: Internal Medicine

## 2020-04-06 ENCOUNTER — Telehealth: Payer: Self-pay | Admitting: Medical Oncology

## 2020-04-06 NOTE — Telephone Encounter (Signed)
To see [facility] tomorrow

## 2020-04-07 ENCOUNTER — Encounter: Payer: Self-pay | Admitting: Internal Medicine

## 2020-04-07 ENCOUNTER — Other Ambulatory Visit: Payer: Self-pay

## 2020-04-07 ENCOUNTER — Inpatient Hospital Stay (HOSPITAL_BASED_OUTPATIENT_CLINIC_OR_DEPARTMENT_OTHER): Payer: Medicare Other | Admitting: Internal Medicine

## 2020-04-07 VITALS — BP 122/77 | HR 80 | Temp 97.4°F | Resp 12 | Ht 66.0 in | Wt 134.0 lb

## 2020-04-07 DIAGNOSIS — J449 Chronic obstructive pulmonary disease, unspecified: Secondary | ICD-10-CM | POA: Diagnosis not present

## 2020-04-07 DIAGNOSIS — Z5112 Encounter for antineoplastic immunotherapy: Secondary | ICD-10-CM

## 2020-04-07 DIAGNOSIS — K219 Gastro-esophageal reflux disease without esophagitis: Secondary | ICD-10-CM | POA: Diagnosis not present

## 2020-04-07 DIAGNOSIS — C3491 Malignant neoplasm of unspecified part of right bronchus or lung: Secondary | ICD-10-CM

## 2020-04-07 DIAGNOSIS — C3411 Malignant neoplasm of upper lobe, right bronchus or lung: Secondary | ICD-10-CM | POA: Diagnosis not present

## 2020-04-07 DIAGNOSIS — I10 Essential (primary) hypertension: Secondary | ICD-10-CM | POA: Diagnosis not present

## 2020-04-07 DIAGNOSIS — M1990 Unspecified osteoarthritis, unspecified site: Secondary | ICD-10-CM | POA: Diagnosis not present

## 2020-04-07 NOTE — Progress Notes (Signed)
[place] Telephone:([phone]) [phone]   Fax:(336) [phone]  OFFICE PROGRESS NOTE  [name], MD [address] [place]  [address]  DIAGNOSIS: Metastatic non-small cell lung cancer initially diagnosed as stage IIB (T1b, N1, M0) non-small cell lung cancer, invasive poorly differentiated carcinoma diagnosed in June 2021 with disease recurrence in November 2021 Molecular studies are negative for EGFR mutation as well as ALK gene translocation.  PD-L1 expression was 50-100%.  This is based on the report from the [employer] trial. Molecular studies by [employer]: Showed no actionable mutations and PD-L1 expression was 90%  PRIOR THERAPY:  1) Status post right upper lobectomy with lymph node dissection on June 17, 2019 under the care of Dr. Roxan Hockey. 2) Adjuvant systemic chemotherapy with cisplatin 75 mg/M2 and Alimta 500 mg/M2 every 3 weeks.  First dose September 02, 2019. Status post 3 cycles.  His treatment was discontinued secondary to intolerance.  CURRENT THERAPY: First-line treatment with immunotherapy with Libtayo (Cempilimab) 350 mg IV every 3 weeks status post 3 cycles.  INTERVAL HISTORY: Luis [name] 70 y.o. male returns to the clinic today for follow-up visit.  The patient is feeling fine today with no concerning complaints except for upset stomach after drinking the oral contrast for the scan yesterday.  He denied having any current chest pain, shortness of breath, cough or hemoptysis.  He denied having any fever or chills.  He has no significant weight loss or night sweats.  He has no fever or chills.  He had repeat CT scan of the chest, abdomen and pelvis performed yesterday and he was very anxious about the scan results and requested to be seen today rather than Monday.  MEDICAL HISTORY: Past Medical History:  Diagnosis Date  . Arthritis    through out body  . Cancer ([place])   . Chronic bronchitis ([place])   . Concussion   . COPD (chronic  obstructive pulmonary disease) ([place])   . Dyspnea   . Emphysema lung ([place])   . Fatigue   . Frozen shoulder    LEFT  . GERD (gastroesophageal reflux disease)   . Hard of hearing   . Headache    alot of days, related to spine issues  . Hypertension   . Lumbar radiculopathy 2013  . Peyronie's disease   . Prostatitis 2011   Dr. Gaynelle Arabian  . RLS (restless legs syndrome)   . Shingles 06/13/2018   shingles on back, finished with prednisone, stills has scabs and pain  . Thigh pain    Bilateral Thigh  . Thigh pain 10/13   bilateral  . Tobacco abuse 2010   still uses electronic cig/ emphysema, SOB easily  . Vertigo    per wifes update  . Wears partial dentures    upper    ALLERGIES:  is allergic to ativan [lorazepam], doxycycline, and tramadol.  MEDICATIONS:  Current Outpatient Medications  Medication Sig Dispense Refill  . acetaminophen (TYLENOL) 500 MG tablet Take 1,000 mg by mouth at bedtime as needed for moderate pain.    . Cholecalciferol (VITAMIN D) 125 MCG (5000 UT) CAPS Take 5,000 Units by mouth 3 (three) times a week.    . citalopram (CELEXA) 20 MG tablet Take 1 tablet by mouth daily.    . Fluticasone-Salmeterol (ADVAIR) 100-50 MCG/DOSE AEPB Inhale 2 puffs into the lungs 2 (two) times daily.    [name] lidocaine-prilocaine (EMLA) cream Apply 1 application topically as needed. Apply 1 tsp over port site  at least 45 minutes prior to lab appointment.Do not rub in cream. Cover with plastic wrap. 30 g 0  . Multiple Vitamin (MULTIVITAMIN) tablet Take 1 tablet by mouth daily.    . valsartan-hydrochlorothiazide (DIOVAN-HCT) 80-12.5 MG tablet Take 1 tablet by mouth daily.    . vitamin B-12 (CYANOCOBALAMIN) 500 MCG tablet 1 tablet     No current facility-administered medications for this visit.    SURGICAL HISTORY:  Past Surgical History:  Procedure Laterality Date  . CATARACT EXTRACTION W/PHACO Right 07/08/2018   Procedure: CATARACT EXTRACTION PHACO AND INTRAOCULAR LENS PLACEMENT  ([place]) RIGHT;  Surgeon: Leandrew Koyanagi, MD;  Location: [facility];  Service: Ophthalmology;  Laterality: Right;  . CATARACT EXTRACTION W/PHACO Left 07/29/2018   Procedure: CATARACT EXTRACTION PHACO AND INTRAOCULAR LENS PLACEMENT ([place]) LEFT TORIC LENS;  Surgeon: Leandrew Koyanagi, MD;  Location: [facility];  Service: Ophthalmology;  Laterality: Left;  . CHEST TUBE INSERTION Right 07/01/2019   Procedure: CHEST TUBE INSERTION;  Surgeon: [name], MD;  Location: [facility];  Service: Thoracic;  Laterality: Right;  . COLONOSCOPY    . EYE SURGERY    . INTERCOSTAL NERVE BLOCK Right 06/17/2019   Procedure: INTERCOSTAL NERVE BLOCK;  Surgeon: [name], MD;  Location: [facility];  Service: Thoracic;  Laterality: Right;  . INTERCOSTAL NERVE BLOCK Right 07/01/2019   Procedure: INTERCOSTAL NERVE BLOCK;  Surgeon: [name], MD;  Location: [facility];  Service: Thoracic;  Laterality: Right;  . IR IMAGING GUIDED PORT INSERTION  02/21/2020  . TONSILLECTOMY    . VIDEO ASSISTED THORACOSCOPY Right 07/01/2019   Procedure: VIDEO ASSISTED THORACOSCOPY - REMOVAL OF RIGHT MIDDLE LOBE BLEBS;  Surgeon: [name], MD;  Location: [facility];  Service: Thoracic;  Laterality: Right;  [name] VIDEO BRONCHOSCOPY WITH INSERTION OF INTERBRONCHIAL VALVE (IBV) Right 06/25/2019   Procedure: VIDEO BRONCHOSCOPY WITH INSERTION OF INTERBRONCHIAL VALVE (IBV); Size 7 IBV into Right Loer Lobe (RLL) Superior Segment, Size 9 IBV into RLL Basilar Segment;  Surgeon: [name], MD;  Location: [facility];  Service: Thoracic;  Laterality: Right;  [name] VIDEO BRONCHOSCOPY WITH INSERTION OF INTERBRONCHIAL VALVE (IBV) N/A 08/06/2019   Procedure: VIDEO BRONCHOSCOPY WITH REMOVAL OF INTERBRONCHIAL VALVE (IBV);  Surgeon: [name], MD;  Location: [facility];  Service: Thoracic;  Laterality: N/A;    REVIEW OF SYSTEMS:  Constitutional: negative Eyes: negative Ears, nose, mouth, throat, and face:  negative Respiratory: negative Cardiovascular: negative Gastrointestinal: positive for diarrhea and nausea Genitourinary:negative Integument/breast: negative Hematologic/lymphatic: negative Musculoskeletal:negative Neurological: negative Behavioral/Psych: negative Endocrine: negative Allergic/Immunologic: negative   PHYSICAL EXAMINATION: General appearance: alert, cooperative and no distress Head: Normocephalic, without obvious abnormality, atraumatic Neck: no adenopathy, no JVD, supple, symmetrical, trachea midline and thyroid not enlarged, symmetric, no tenderness/mass/nodules Lymph nodes: Cervical, supraclavicular, and axillary nodes normal. Resp: clear to auscultation bilaterally Back: symmetric, no curvature. ROM normal. No CVA tenderness. Cardio: regular rate and rhythm, S1, S2 normal, no murmur, click, rub or gallop GI: soft, non-tender; bowel sounds normal; no masses,  no organomegaly Extremities: extremities normal, atraumatic, no cyanosis or edema Neurologic: Alert and oriented X 3, normal strength and tone. Normal symmetric reflexes. Normal coordination and gait  ECOG PERFORMANCE STATUS: 0 - Asymptomatic  Blood pressure 122/77, pulse 80, temperature (!) 97.4 F (36.3 C), temperature source Tympanic, resp. rate 12, height _0  (1.676 m), weight 134 lb (60.8 kg), SpO2 99 %.  LABORATORY DATA: Lab Results  Component Value Date   WBC 8.8 03/20/2020  HGB 13.2 03/20/2020   HCT 40.8 03/20/2020   MCV 82.9 03/20/2020   PLT 253 03/20/2020      Chemistry      Component Value Date/Time   NA 137 03/20/2020 1315   K 4.2 03/20/2020 1315   CL 103 03/20/2020 1315   CO2 27 03/20/2020 1315   BUN 14 03/20/2020 1315   CREATININE 1.05 03/20/2020 1315      Component Value Date/Time   CALCIUM 9.4 03/20/2020 1315   ALKPHOS 64 03/20/2020 1315   AST 15 03/20/2020 1315   ALT 13 03/20/2020 1315   BILITOT 0.4 03/20/2020 1315       RADIOGRAPHIC STUDIES: CT Chest W  Contrast  Result Date: 04/06/2020 CLINICAL DATA:  Metastatic non-small cell lung cancer, assess treatment response, status post right upper lobectomy, chemotherapy, ongoing immunotherapy EXAM: CT CHEST, ABDOMEN, AND PELVIS WITH CONTRAST TECHNIQUE: Multidetector CT imaging of the chest, abdomen and pelvis was performed following the standard protocol during bolus administration of intravenous contrast. CONTRAST:  130m OMNIPAQUE IOHEXOL 300 MG/ML SOLN, additional oral enteric contrast COMPARISON:  PET-CT, 12/15/2019 FINDINGS: CT CHEST FINDINGS Cardiovascular: Right chest port catheter. Aortic atherosclerosis. Normal heart size. Left coronary artery calcifications. No pericardial effusion. Mediastinum/Nodes: No enlarged mediastinal, hilar, or axillary lymph nodes. Thyroid gland, trachea, and esophagus demonstrate no significant findings. Lungs/Pleura: Redemonstrated postoperative findings status post right upper lobectomy. Moderate centrilobular emphysema. Small, loculated right pleural effusion with associated pleural thickening. Discrete FDG avid pleural nodularity is resolved. Unchanged irregular mixed solid and ground-glass nodule of the posterior left upper lobe measuring 1.8 cm (series 4, image 36) and adjacent smaller irregular nodule measuring 0.8 cm (series 4, image 53). Musculoskeletal: No chest wall mass or suspicious bone lesions identified. CT ABDOMEN PELVIS FINDINGS Hepatobiliary: No solid liver abnormality is seen. Unchanged cyst or hemangioma of the left lobe of the liver (series 2, image 52). No gallstones, gallbladder wall thickening, or biliary dilatation. Pancreas: Unremarkable. No pancreatic ductal dilatation or surrounding inflammatory changes. Spleen: Normal in size without significant abnormality. Adrenals/Urinary Tract: Adrenal glands are unremarkable. Simple bilateral renal cysts. Kidneys are otherwise normal, without renal calculi, solid lesion, or hydronephrosis. Bladder is unremarkable.  Stomach/Bowel: Stomach is within normal limits. Appendix appears normal. No evidence of bowel wall thickening, distention, or inflammatory changes. Sigmoid diverticulosis. Vascular/Lymphatic: Aortic atherosclerosis. No enlarged abdominal or pelvic lymph nodes. Reproductive: Prostatomegaly. Other: No abdominal wall hernia or abnormality. No abdominopelvic ascites. Musculoskeletal: No acute or significant osseous findings. IMPRESSION: 1. Redemonstrated postoperative findings status post right upper lobectomy. 2. Small, loculated right pleural effusion with associated pleural thickening. Discrete FDG avid pleural nodularity is resolved in comparison to prior PET-CT. 3. No evidence of lymphadenopathy nor metastatic disease within the abdomen or pelvis. 4. Unchanged sub solid nodules of the posterior left upper lobe, which remain nonspecific. Attention on follow-up. 5. Emphysema. 6. Coronary artery disease. Aortic Atherosclerosis (ICD10-I70.0) and Emphysema (ICD10-J43.9). Electronically Signed   By: AEddie CandleM.D.   On: 04/06/2020 10:40   CT Abdomen Pelvis W Contrast  Result Date: 04/06/2020 CLINICAL DATA:  Metastatic non-small cell lung cancer, assess treatment response, status post right upper lobectomy, chemotherapy, ongoing immunotherapy EXAM: CT CHEST, ABDOMEN, AND PELVIS WITH CONTRAST TECHNIQUE: Multidetector CT imaging of the chest, abdomen and pelvis was performed following the standard protocol during bolus administration of intravenous contrast. CONTRAST:  1057mOMNIPAQUE IOHEXOL 300 MG/ML SOLN, additional oral enteric contrast COMPARISON:  PET-CT, 12/15/2019 FINDINGS: CT CHEST FINDINGS Cardiovascular: Right chest port catheter. Aortic atherosclerosis. Normal heart  size. Left coronary artery calcifications. No pericardial effusion. Mediastinum/Nodes: No enlarged mediastinal, hilar, or axillary lymph nodes. Thyroid gland, trachea, and esophagus demonstrate no significant findings. Lungs/Pleura:  Redemonstrated postoperative findings status post right upper lobectomy. Moderate centrilobular emphysema. Small, loculated right pleural effusion with associated pleural thickening. Discrete FDG avid pleural nodularity is resolved. Unchanged irregular mixed solid and ground-glass nodule of the posterior left upper lobe measuring 1.8 cm (series 4, image 36) and adjacent smaller irregular nodule measuring 0.8 cm (series 4, image 53). Musculoskeletal: No chest wall mass or suspicious bone lesions identified. CT ABDOMEN PELVIS FINDINGS Hepatobiliary: No solid liver abnormality is seen. Unchanged cyst or hemangioma of the left lobe of the liver (series 2, image 52). No gallstones, gallbladder wall thickening, or biliary dilatation. Pancreas: Unremarkable. No pancreatic ductal dilatation or surrounding inflammatory changes. Spleen: Normal in size without significant abnormality. Adrenals/Urinary Tract: Adrenal glands are unremarkable. Simple bilateral renal cysts. Kidneys are otherwise normal, without renal calculi, solid lesion, or hydronephrosis. Bladder is unremarkable. Stomach/Bowel: Stomach is within normal limits. Appendix appears normal. No evidence of bowel wall thickening, distention, or inflammatory changes. Sigmoid diverticulosis. Vascular/Lymphatic: Aortic atherosclerosis. No enlarged abdominal or pelvic lymph nodes. Reproductive: Prostatomegaly. Other: No abdominal wall hernia or abnormality. No abdominopelvic ascites. Musculoskeletal: No acute or significant osseous findings. IMPRESSION: 1. Redemonstrated postoperative findings status post right upper lobectomy. 2. Small, loculated right pleural effusion with associated pleural thickening. Discrete FDG avid pleural nodularity is resolved in comparison to prior PET-CT. 3. No evidence of lymphadenopathy nor metastatic disease within the abdomen or pelvis. 4. Unchanged sub solid nodules of the posterior left upper lobe, which remain nonspecific. Attention on  follow-up. 5. Emphysema. 6. Coronary artery disease. Aortic Atherosclerosis (ICD10-I70.0) and Emphysema (ICD10-J43.9). Electronically Signed   By: Eddie Candle M.D.   On: 04/06/2020 10:40    ASSESSMENT AND PLAN: This is a very pleasant 70 years old white male recently diagnosed with stage IIb (T1b, N1, M0) non-small cell lung cancer, adenocarcinoma status post right upper lobectomy with lymph node dissection under the care of Dr. Roxan Hockey on June 17, 2019. The patient had molecular studies on the [facility] clinical trial that showed negative for EGFR mutation as well as ALK gene translocation but PD-L1 expression in the range of 50-100%. The patient underwent standard adjuvant systemic chemotherapy with cisplatin 75 mg/M2 and Alimta 500 mg/M2 every 3 weeks status post 3 cycles.  His treatment was discontinued secondary to intolerance. Unfortunately repeat CT scan as well as PET scan after completion of the adjuvant chemotherapy showed concerning findings for disease recurrence with small right pleural effusion as well as multiple pleural implants consistent with metastatic disease which was confirmed with repeat biopsy. He had molecular studies by [employer] and the blood test showed no actionable mutation.  His PD-L1 expression was 90%. The patient is currently undergoing treatment with immunotherapy with Libtayo (Cempilimab) 350 mg IV every 3 weeks status post 3 cycles. He has been tolerating this treatment well with no concerning adverse effects. He had repeat CT scan of the chest, abdomen pelvis performed recently.  I personally and independently reviewed the scan images and discussed the result and showed the images to the patient and his wife. His scan showed significant improvement in his disease with resolution of the pleural-based metastatic lesions. I recommended for the patient to continue his current treatment with Libtayo (Cempilimab) with the same dose and he is expected to start cycle  #4 on Monday, April 10, 2020. The patient will come  back for follow-up visit in 3 weeks for evaluation before starting cycle #5. For the nausea and diarrhea from the oral contrast, he will continue with supportive care with Compazine and Imodium. The patient was advised to call immediately if he has any other concerning symptoms in the interval. The patient voices understanding of current disease status and treatment options and is in agreement with the current care plan.  All questions were answered. The patient knows to call the clinic with any problems, questions or concerns. We can certainly see the patient much sooner if necessary.  Disclaimer: This note was dictated with voice recognition software. Similar sounding words can inadvertently be transcribed and may not be corrected upon review.

## 2020-04-08 DIAGNOSIS — J441 Chronic obstructive pulmonary disease with (acute) exacerbation: Secondary | ICD-10-CM | POA: Diagnosis not present

## 2020-04-08 DIAGNOSIS — K219 Gastro-esophageal reflux disease without esophagitis: Secondary | ICD-10-CM | POA: Diagnosis not present

## 2020-04-08 DIAGNOSIS — J449 Chronic obstructive pulmonary disease, unspecified: Secondary | ICD-10-CM | POA: Diagnosis not present

## 2020-04-10 ENCOUNTER — Inpatient Hospital Stay: Payer: Medicare Other

## 2020-04-10 ENCOUNTER — Other Ambulatory Visit: Payer: Self-pay

## 2020-04-10 ENCOUNTER — Inpatient Hospital Stay: Payer: Medicare Other | Admitting: Physician Assistant

## 2020-04-10 ENCOUNTER — Other Ambulatory Visit: Payer: Self-pay | Admitting: Internal Medicine

## 2020-04-10 VITALS — BP 127/75 | HR 72 | Temp 98.3°F | Resp 16

## 2020-04-10 DIAGNOSIS — C3491 Malignant neoplasm of unspecified part of right bronchus or lung: Secondary | ICD-10-CM

## 2020-04-10 DIAGNOSIS — I10 Essential (primary) hypertension: Secondary | ICD-10-CM | POA: Diagnosis not present

## 2020-04-10 DIAGNOSIS — Z5112 Encounter for antineoplastic immunotherapy: Secondary | ICD-10-CM | POA: Diagnosis not present

## 2020-04-10 DIAGNOSIS — M1990 Unspecified osteoarthritis, unspecified site: Secondary | ICD-10-CM | POA: Diagnosis not present

## 2020-04-10 DIAGNOSIS — K219 Gastro-esophageal reflux disease without esophagitis: Secondary | ICD-10-CM | POA: Diagnosis not present

## 2020-04-10 DIAGNOSIS — J449 Chronic obstructive pulmonary disease, unspecified: Secondary | ICD-10-CM | POA: Diagnosis not present

## 2020-04-10 DIAGNOSIS — C3411 Malignant neoplasm of upper lobe, right bronchus or lung: Secondary | ICD-10-CM | POA: Diagnosis not present

## 2020-04-10 DIAGNOSIS — R5382 Chronic fatigue, unspecified: Secondary | ICD-10-CM

## 2020-04-10 LAB — CBC WITH DIFFERENTIAL (CANCER CENTER ONLY)
Abs Immature Granulocytes: 0.02 10*3/uL (ref 0.00–0.07)
Basophils Absolute: 0.1 10*3/uL (ref 0.0–0.1)
Basophils Relative: 1 %
Eosinophils Absolute: 0.1 10*3/uL (ref 0.0–0.5)
Eosinophils Relative: 1 %
HCT: 40.8 % (ref 39.0–52.0)
Hemoglobin: 13.3 g/dL (ref 13.0–17.0)
Immature Granulocytes: 0 %
Lymphocytes Relative: 19 %
Lymphs Abs: 1.4 10*3/uL (ref 0.7–4.0)
MCH: 27.3 pg (ref 26.0–34.0)
MCHC: 32.6 g/dL (ref 30.0–36.0)
MCV: 83.8 fL (ref 80.0–100.0)
Monocytes Absolute: 0.5 10*3/uL (ref 0.1–1.0)
Monocytes Relative: 8 %
Neutro Abs: 5.2 10*3/uL (ref 1.7–7.7)
Neutrophils Relative %: 71 %
Platelet Count: 225 10*3/uL (ref 150–400)
RBC: 4.87 MIL/uL (ref 4.22–5.81)
RDW: 15.5 % (ref 11.5–15.5)
WBC Count: 7.2 10*3/uL (ref 4.0–10.5)
nRBC: 0 % (ref 0.0–0.2)

## 2020-04-10 LAB — CMP (CANCER CENTER ONLY)
ALT: 12 U/L (ref 0–44)
AST: 13 U/L — ABNORMAL LOW (ref 15–41)
Albumin: 4.2 g/dL (ref 3.5–5.0)
Alkaline Phosphatase: 47 U/L (ref 38–126)
Anion gap: 9 (ref 5–15)
BUN: 20 mg/dL (ref 8–23)
CO2: 28 mmol/L (ref 22–32)
Calcium: 9 mg/dL (ref 8.9–10.3)
Chloride: 103 mmol/L (ref 98–111)
Creatinine: 1.14 mg/dL (ref 0.61–1.24)
GFR, Estimated: >60 mL/min (ref >60)
Glucose, Bld: 81 mg/dL (ref 70–99)
Potassium: 4.5 mmol/L (ref 3.5–5.1)
Sodium: 140 mmol/L (ref 135–145)
Total Bilirubin: 0.3 mg/dL (ref 0.3–1.2)
Total Protein: 6.6 g/dL (ref 6.5–8.1)

## 2020-04-10 LAB — TSH: TSH: 2.266 u[IU]/mL (ref 0.320–4.118)

## 2020-04-10 MED ORDER — SODIUM CHLORIDE 0.9% FLUSH
10.0000 mL | INTRAVENOUS | Status: DC | PRN
  Administered 2020-04-10: 10 mL
  Filled 2020-04-10: qty 10

## 2020-04-10 MED ORDER — HEPARIN SOD (PORK) LOCK FLUSH 100 UNIT/ML IV SOLN
500.0000 [IU] | Freq: Once | INTRAVENOUS | Status: AC | PRN
  Administered 2020-04-10: 500 [IU]
  Filled 2020-04-10: qty 5

## 2020-04-10 MED ORDER — SODIUM CHLORIDE 0.9 % IV SOLN
350.0000 mg | Freq: Once | INTRAVENOUS | Status: AC
  Administered 2020-04-10: 350 mg via INTRAVENOUS
  Filled 2020-04-10: qty 7

## 2020-04-10 MED ORDER — SODIUM CHLORIDE 0.9 % IV SOLN
Freq: Once | INTRAVENOUS | Status: AC
  Filled 2020-04-10: qty 250

## 2020-04-10 NOTE — Patient Instructions (Signed)
Cemiplimab injection What is this medicine? CEMIPLIMAB (se mip li mab) is a monoclonal antibody. It treats certain types of cancer. Some of the cancers treated are cutaneous squamous cell carcinoma and basal cell carcinoma. This medicine may be used for other purposes; ask your health care provider or pharmacist if you have questions. COMMON BRAND NAME(S): LIBTAYO What should I tell my health care provider before I take this medicine? They need to know if you have any of these conditions:  autoimmune diseases like Crohn's disease, ulcerative colitis, or lupus  have had or planning to have an allogeneic stem cell transplant (uses someone else's stem cells)  history of organ transplant  nervous system problems like myasthenia gravis or Guillain-Barre syndrome  an unusual or allergic reaction to cemiplimab, other drugs, foods, dyes, or preservatives  pregnant or trying to get pregnant  breast-feeding How should I use this medicine? This medicine is for infusion into a vein. It is given by a health care professional in a hospital or clinic setting. A special MedGuide will be given to you before each treatment. Be sure to read this information carefully each time. Talk to your pediatrician regarding the use of this medicine in children. Special care may be needed. Overdosage: If you think you have taken too much of this medicine contact a poison control center or emergency room at once. NOTE: This medicine is only for you. Do not share this medicine with others. What if I miss a dose? It is important not to miss your dose. Call your doctor or health care professional if you are unable to keep an appointment. What may interact with this medicine? Interactions have not been studied. This list may not describe all possible interactions. Give your health care provider a list of all the medicines, herbs, non-prescription drugs, or dietary supplements you use. Also tell them if you smoke, drink  alcohol, or use illegal drugs. Some items may interact with your medicine. What should I watch for while using this medicine? Your condition will be monitored carefully while you are receiving this medicine. You may need blood work done while you are taking this medicine. Do not become pregnant while taking this medicine or for at least 4 months after stopping it. Women should inform their doctor if they wish to become pregnant or think they might be pregnant. There is a potential for serious side effects to an unborn child. Talk to your health care professional or pharmacist for more information. Do not breast-feed an infant while taking this medicine or for at least 4 months after the last dose. What side effects may I notice from receiving this medicine? Side effects that you should report to your doctor or health care professional as soon as possible:  allergic reactions like skin rash, itching or hives; swelling of the face, lips, or tongue  black, tarry stools  bloody or watery diarrhea  breathing problems  changes in vision  changes in voice  chest pain or chest tightness  chills  cough  dizziness  fast or irregular heart beat  feeling faint or lightheaded  hair loss  increased hunger or thirst  muscle weakness  persistent headache  redness, blistering, peeling or loosening of the skin, including inside the mouth  signs and symptoms of kidney injury like trouble passing urine or change in the amount of urine  signs and symptoms of liver injury like dark yellow or brown urine; general ill feeling or flu-like symptoms; light-colored stools; loss of appetite; nausea;  right upper belly pain; unusually weak or tired; yellowing of the eyes or skin  stomach pain  unusual bleeding or bruising  weight gain or weight loss  unusual sweating Side effects that usually do not require medical attention (report these to your doctor or health care professional if they  continue or are bothersome):  bone pain  constipation  muscle pain  tiredness This list may not describe all possible side effects. Call your doctor for medical advice about side effects. You may report side effects to FDA at 1-800-FDA-1088. Where should I keep my medicine? This drug is given in a hospital or clinic and will not be stored at home. NOTE: This sheet is a summary. It may not cover all possible information. If you have questions about this medicine, talk to your doctor, pharmacist, or health care provider.  2021 Elsevier/Gold Standard (2019-03-02 10:47:00)

## 2020-04-24 NOTE — Progress Notes (Signed)
Luis [name] OFFICE PROGRESS NOTE  Luis Huddle, MD [place] [address] [place] [address]  DIAGNOSIS: Metastatic non-small cell lung cancer initially diagnosed as stage IIB (T1b, N1, M0) non-small cell lung cancer, invasive poorly differentiated carcinoma diagnosed in June 2021 with disease recurrence in November 2021 Molecular studies are negative for EGFR mutation as well as ALK gene translocation.  PD-L1 expression was 50-100%.  This is based on the report from the [facility] trial. Molecular studies by [facility] [employer]: Showed no actionable mutations and PD-L1 expression was 90%  PRIOR THERAPY: 1) Status post right upper lobectomy with lymph node dissection on June 17, 2019 under the care of Dr. Roxan [name]. 2) Adjuvant systemic chemotherapy with cisplatin 75 mg/M2 and Alimta 500 mg/M2 every 3 weeks.  First dose September 02, 2019. Status post 3 cycles.  His treatment was discontinued secondary to intolerance.  CURRENT THERAPY: : First-line treatment with immunotherapy with Libtayo (Cempilimab) 350 mg IV every 3 weeks status post 4 cycles. First dose on 01/24/20.  INTERVAL HISTORY: Luis [name] 70 y.o. male returns to the clinic for a follow up visit. The patient is feeling fine today without any concerning complaints. The patient continues to tolerate treatment with single agent immunotherapy with Libtayo well without any adverse side effects. Denies any fever, chills, or weight loss. He reports that he used to have night sweats a lot but "none lately". Denies any chest pain or hemoptysis. He reports shortness of breath with exertion due to his COPD and a cough which is stable. Denies any nausea, vomiting, diarrhea, or constipation. Denies any headache or visual changes. Denies any rashes or skin changes. The patient is here today for evaluation prior to starting cycle # 5    MEDICAL HISTORY: Past Medical History:  Diagnosis Date  . Arthritis    through out body  .  Cancer ([place])   . Chronic bronchitis ([place])   . Concussion   . COPD (chronic obstructive pulmonary disease) ([place])   . Dyspnea   . Emphysema lung ([place])   . Fatigue   . Frozen shoulder    LEFT  . GERD (gastroesophageal reflux disease)   . Hard of hearing   . Headache    alot of days, related to spine issues  . Hypertension   . Lumbar radiculopathy 2013  . Peyronie's disease   . Prostatitis 2011   Dr. Gaynelle [name]  . RLS (restless legs syndrome)   . Shingles 06/13/2018   shingles on back, finished with prednisone, stills has scabs and pain  . Thigh pain    Bilateral Thigh  . Thigh pain 10/13   bilateral  . Tobacco abuse 2010   still uses electronic cig/ emphysema, SOB easily  . Vertigo    per wifes update  . Wears partial dentures    upper    ALLERGIES:  is allergic to ativan [lorazepam], doxycycline, and tramadol.  MEDICATIONS:  Current Outpatient Medications  Medication Sig Dispense Refill  . acetaminophen (TYLENOL) 500 MG tablet Take 1,000 mg by mouth at bedtime as needed for moderate pain.    . Cholecalciferol (VITAMIN D) 125 MCG (5000 UT) CAPS Take 5,000 Units by mouth 3 (three) times a week.    . citalopram (CELEXA) 20 MG tablet Take 1 tablet by mouth daily.    . Fluticasone-Salmeterol (ADVAIR) 100-50 MCG/DOSE AEPB Inhale 2 puffs into the lungs 2 (two) times daily.    [name] lidocaine-prilocaine (EMLA) cream Apply 1 application topically as needed. Apply  1 tsp over port site at least 45 minutes prior to lab appointment.Do not rub in cream. Cover with plastic wrap. 30 g 0  . Multiple Vitamin (MULTIVITAMIN) tablet Take 1 tablet by mouth daily.    . valsartan-hydrochlorothiazide (DIOVAN-HCT) 80-12.5 MG tablet Take 1 tablet by mouth daily.    . vitamin B-12 (CYANOCOBALAMIN) 500 MCG tablet 1 tablet     No current facility-administered medications for this visit.    SURGICAL HISTORY:  Past Surgical History:  Procedure Laterality Date  . CATARACT EXTRACTION W/PHACO Right  07/08/2018   Procedure: CATARACT EXTRACTION PHACO AND INTRAOCULAR LENS PLACEMENT ([place]) RIGHT;  Surgeon: Luis Koyanagi, MD;  Location: [facility];  Service: Ophthalmology;  Laterality: Right;  . CATARACT EXTRACTION W/PHACO Left 07/29/2018   Procedure: CATARACT EXTRACTION PHACO AND INTRAOCULAR LENS PLACEMENT ([place]) LEFT TORIC LENS;  Surgeon: Luis Koyanagi, MD;  Location: [facility];  Service: Ophthalmology;  Laterality: Left;  . CHEST TUBE INSERTION Right 07/01/2019   Procedure: CHEST TUBE INSERTION;  Surgeon: Luis Nakayama, MD;  Location: [facility];  Service: Thoracic;  Laterality: Right;  . COLONOSCOPY    . EYE SURGERY    . INTERCOSTAL NERVE BLOCK Right 06/17/2019   Procedure: INTERCOSTAL NERVE BLOCK;  Surgeon: Luis Nakayama, MD;  Location: [facility];  Service: Thoracic;  Laterality: Right;  . INTERCOSTAL NERVE BLOCK Right 07/01/2019   Procedure: INTERCOSTAL NERVE BLOCK;  Surgeon: Luis Nakayama, MD;  Location: [facility];  Service: Thoracic;  Laterality: Right;  . IR IMAGING GUIDED PORT INSERTION  02/21/2020  . TONSILLECTOMY    . VIDEO ASSISTED THORACOSCOPY Right 07/01/2019   Procedure: VIDEO ASSISTED THORACOSCOPY - REMOVAL OF RIGHT MIDDLE LOBE BLEBS;  Surgeon: Luis Nakayama, MD;  Location: [facility];  Service: Thoracic;  Laterality: Right;  [name] VIDEO BRONCHOSCOPY WITH INSERTION OF INTERBRONCHIAL VALVE (IBV) Right 06/25/2019   Procedure: VIDEO BRONCHOSCOPY WITH INSERTION OF INTERBRONCHIAL VALVE (IBV); Size 7 IBV into Right Loer Lobe (RLL) Superior Segment, Size 9 IBV into RLL Basilar Segment;  Surgeon: Luis Nakayama, MD;  Location: [facility];  Service: Thoracic;  Laterality: Right;  [name] VIDEO BRONCHOSCOPY WITH INSERTION OF INTERBRONCHIAL VALVE (IBV) N/A 08/06/2019   Procedure: VIDEO BRONCHOSCOPY WITH REMOVAL OF INTERBRONCHIAL VALVE (IBV);  Surgeon: Luis Nakayama, MD;  Location: [facility];  Service: Thoracic;  Laterality: N/A;    REVIEW OF SYSTEMS:    Review of Systems  Constitutional: Positive for fatigue. Negative for appetite change, chills, fever and unexpected weight change.  HENT: Negative for mouth sores, nosebleeds, sore throat and trouble swallowing.   Eyes: Negative for eye problems and icterus.  Respiratory: Positive for baseline shortness of breath with exertion and cough. Negative for hemoptysis and wheezing.   Cardiovascular: Negative for chest pain and leg swelling.  Gastrointestinal: Negative for abdominal pain, constipation, diarrhea, nausea and vomiting.  Genitourinary: Negative for bladder incontinence, difficulty urinating, dysuria, frequency and hematuria.   Musculoskeletal: Negative for back pain, gait problem, neck pain and neck stiffness.  Skin: Negative for itching and rash.  Neurological: Negative for dizziness, extremity weakness, gait problem, headaches, light-headedness and seizures.  Hematological: Negative for adenopathy. Does not bruise/bleed easily.  Psychiatric/Behavioral: Negative for confusion, depression and sleep disturbance. The patient is not nervous/anxious.     PHYSICAL EXAMINATION:  Blood pressure 127/86, pulse 88, temperature 97.8 F (36.6 C), temperature source Tympanic, resp. rate 16, height _0  (1.676 m), weight 133 lb 12.8 oz (60.7 kg), SpO2 100 %.  ECOG PERFORMANCE STATUS:  1 - Symptomatic but completely ambulatory  Physical Exam  Constitutional: Oriented to person, place, and time and well-developed, well-nourished, and in no distress.  HENT:  Head: Normocephalic and atraumatic.  Mouth/Throat: Oropharynx is clear and moist. No oropharyngeal exudate.  Eyes: Conjunctivae are normal. Right eye exhibits no discharge. Left eye exhibits no discharge. No scleral icterus.  Neck: Normal range of motion. Neck supple.  Cardiovascular: Normal rate, regular rhythm, normal heart sounds and intact distal pulses.   Pulmonary/Chest: Effort normal. Quiet breath sounds at the base of the right lung.  No respiratory distress. No wheezes. No rales.  Abdominal: Soft. Bowel sounds are normal. Exhibits no distension and no mass. There is no tenderness.  Musculoskeletal: Normal range of motion. Exhibits no edema.  Lymphadenopathy:    No cervical adenopathy.  Neurological: Alert and oriented to person, place, and time. Exhibits normal muscle tone. Gait normal. Coordination normal.  Skin: Skin is warm and dry. No rash noted. Not diaphoretic. No erythema. No pallor.  Psychiatric: Mood, memory and judgment normal.  Vitals reviewed.  LABORATORY DATA: Lab Results  Component Value Date   WBC 8.4 05/01/2020   HGB 13.5 05/01/2020   HCT 40.8 05/01/2020   MCV 84.1 05/01/2020   PLT 218 05/01/2020      Chemistry      Component Value Date/Time   NA 140 04/10/2020 0945   K 4.5 04/10/2020 0945   CL 103 04/10/2020 0945   CO2 28 04/10/2020 0945   BUN 20 04/10/2020 0945   CREATININE 1.14 04/10/2020 0945      Component Value Date/Time   CALCIUM 9.0 04/10/2020 0945   ALKPHOS 47 04/10/2020 0945   AST 13 (L) 04/10/2020 0945   ALT 12 04/10/2020 0945   BILITOT 0.3 04/10/2020 0945       RADIOGRAPHIC STUDIES:  CT Chest W Contrast  Result Date: 04/06/2020 CLINICAL DATA:  Metastatic non-small cell lung cancer, assess treatment response, status post right upper lobectomy, chemotherapy, ongoing immunotherapy EXAM: CT CHEST, ABDOMEN, AND PELVIS WITH CONTRAST TECHNIQUE: Multidetector CT imaging of the chest, abdomen and pelvis was performed following the standard protocol during bolus administration of intravenous contrast. CONTRAST:  127m OMNIPAQUE IOHEXOL 300 MG/ML SOLN, additional oral enteric contrast COMPARISON:  PET-CT, 12/15/2019 FINDINGS: CT CHEST FINDINGS Cardiovascular: Right chest port catheter. Aortic atherosclerosis. Normal heart size. Left coronary artery calcifications. No pericardial effusion. Mediastinum/Nodes: No enlarged mediastinal, hilar, or axillary lymph nodes. Thyroid gland,  trachea, and esophagus demonstrate no significant findings. Lungs/Pleura: Redemonstrated postoperative findings status post right upper lobectomy. Moderate centrilobular emphysema. Small, loculated right pleural effusion with associated pleural thickening. Discrete FDG avid pleural nodularity is resolved. Unchanged irregular mixed solid and ground-glass nodule of the posterior left upper lobe measuring 1.8 cm (series 4, image 36) and adjacent smaller irregular nodule measuring 0.8 cm (series 4, image 53). Musculoskeletal: No chest wall mass or suspicious bone lesions identified. CT ABDOMEN PELVIS FINDINGS Hepatobiliary: No solid liver abnormality is seen. Unchanged cyst or hemangioma of the left lobe of the liver (series 2, image 52). No gallstones, gallbladder wall thickening, or biliary dilatation. Pancreas: Unremarkable. No pancreatic ductal dilatation or surrounding inflammatory changes. Spleen: Normal in size without significant abnormality. Adrenals/Urinary Tract: Adrenal glands are unremarkable. Simple bilateral renal cysts. Kidneys are otherwise normal, without renal calculi, solid lesion, or hydronephrosis. Bladder is unremarkable. Stomach/Bowel: Stomach is within normal limits. Appendix appears normal. No evidence of bowel wall thickening, distention, or inflammatory changes. Sigmoid diverticulosis. Vascular/Lymphatic: Aortic atherosclerosis. No enlarged abdominal or pelvic  lymph nodes. Reproductive: Prostatomegaly. Other: No abdominal wall hernia or abnormality. No abdominopelvic ascites. Musculoskeletal: No acute or significant osseous findings. IMPRESSION: 1. Redemonstrated postoperative findings status post right upper lobectomy. 2. Small, loculated right pleural effusion with associated pleural thickening. Discrete FDG avid pleural nodularity is resolved in comparison to prior PET-CT. 3. No evidence of lymphadenopathy nor metastatic disease within the abdomen or pelvis. 4. Unchanged sub solid nodules  of the posterior left upper lobe, which remain nonspecific. Attention on follow-up. 5. Emphysema. 6. Coronary artery disease. Aortic Atherosclerosis (ICD10-I70.0) and Emphysema (ICD10-J43.9). Electronically Signed   By: Eddie Candle M.D.   On: 04/06/2020 10:40   CT Abdomen Pelvis W Contrast  Result Date: 04/06/2020 CLINICAL DATA:  Metastatic non-small cell lung cancer, assess treatment response, status post right upper lobectomy, chemotherapy, ongoing immunotherapy EXAM: CT CHEST, ABDOMEN, AND PELVIS WITH CONTRAST TECHNIQUE: Multidetector CT imaging of the chest, abdomen and pelvis was performed following the standard protocol during bolus administration of intravenous contrast. CONTRAST:  183m OMNIPAQUE IOHEXOL 300 MG/ML SOLN, additional oral enteric contrast COMPARISON:  PET-CT, 12/15/2019 FINDINGS: CT CHEST FINDINGS Cardiovascular: Right chest port catheter. Aortic atherosclerosis. Normal heart size. Left coronary artery calcifications. No pericardial effusion. Mediastinum/Nodes: No enlarged mediastinal, hilar, or axillary lymph nodes. Thyroid gland, trachea, and esophagus demonstrate no significant findings. Lungs/Pleura: Redemonstrated postoperative findings status post right upper lobectomy. Moderate centrilobular emphysema. Small, loculated right pleural effusion with associated pleural thickening. Discrete FDG avid pleural nodularity is resolved. Unchanged irregular mixed solid and ground-glass nodule of the posterior left upper lobe measuring 1.8 cm (series 4, image 36) and adjacent smaller irregular nodule measuring 0.8 cm (series 4, image 53). Musculoskeletal: No chest wall mass or suspicious bone lesions identified. CT ABDOMEN PELVIS FINDINGS Hepatobiliary: No solid liver abnormality is seen. Unchanged cyst or hemangioma of the left lobe of the liver (series 2, image 52). No gallstones, gallbladder wall thickening, or biliary dilatation. Pancreas: Unremarkable. No pancreatic ductal dilatation or  surrounding inflammatory changes. Spleen: Normal in size without significant abnormality. Adrenals/Urinary Tract: Adrenal glands are unremarkable. Simple bilateral renal cysts. Kidneys are otherwise normal, without renal calculi, solid lesion, or hydronephrosis. Bladder is unremarkable. Stomach/Bowel: Stomach is within normal limits. Appendix appears normal. No evidence of bowel wall thickening, distention, or inflammatory changes. Sigmoid diverticulosis. Vascular/Lymphatic: Aortic atherosclerosis. No enlarged abdominal or pelvic lymph nodes. Reproductive: Prostatomegaly. Other: No abdominal wall hernia or abnormality. No abdominopelvic ascites. Musculoskeletal: No acute or significant osseous findings. IMPRESSION: 1. Redemonstrated postoperative findings status post right upper lobectomy. 2. Small, loculated right pleural effusion with associated pleural thickening. Discrete FDG avid pleural nodularity is resolved in comparison to prior PET-CT. 3. No evidence of lymphadenopathy nor metastatic disease within the abdomen or pelvis. 4. Unchanged sub solid nodules of the posterior left upper lobe, which remain nonspecific. Attention on follow-up. 5. Emphysema. 6. Coronary artery disease. Aortic Atherosclerosis (ICD10-I70.0) and Emphysema (ICD10-J43.9). Electronically Signed   By: AEddie CandleM.D.   On: 04/06/2020 10:40     ASSESSMENT/PLAN:  This is a very pleasant 70year old Caucasian male with metastatic lung cancer initially diagnosed as stage IIb (T1b, N1, M0) non-small cell lung cancer, adenocarcinoma. He is status post a right upper lobe lobectomy with lymph node dissection under the care of Dr. HRoxan [name] This was performed on June 17, 2019. He has no actionable mutations. His PD-L1 expression is 90%. He had evidence for metastatic disease in November 2021 with small right pleural effusion as well as multiple right pleural implants.   The  patient completed 3 of the 4 planned adjuvant chemotherapy  cycles withcisplatin 75 mg per metered square and Alimta 500 mg/m.This was discontinued after cycle #3 due to intolerance.  When the patient was found to have evidence for disease recurrence, the patient was started on immunotherapy with Libtayo (Cempilimab) 350 mg IV every 3 weeks status post 4 cycles. He is tolerating this well.    Labs were reviewed. Recommend that he proceed with cycle #5 today as scheduled.   We will see him back for a follow up visit in 3 weeks for evaluation before starting cycle #6.   The patient does not use tobacco products but does vape. I strongly encouraged the patient to quit vaping. The patient thinks he can quit cold [place] and will think about it.   The patient was advised to call immediately if he has any concerning symptoms in the interval. The patient voices understanding of current disease status and treatment options and is in agreement with the current care plan. All questions were answered. The patient knows to call the clinic with any problems, questions or concerns. We can certainly see the patient much sooner if necessary        No orders of the defined types were placed in this encounter.    I spent 20-29 minutes in this this encounter  Hisashi Amadon L Yancey Pedley, PA-C 05/01/20

## 2020-04-25 DIAGNOSIS — Z0001 Encounter for general adult medical examination with abnormal findings: Secondary | ICD-10-CM | POA: Diagnosis not present

## 2020-04-25 DIAGNOSIS — Z125 Encounter for screening for malignant neoplasm of prostate: Secondary | ICD-10-CM | POA: Diagnosis not present

## 2020-04-25 DIAGNOSIS — C3410 Malignant neoplasm of upper lobe, unspecified bronchus or lung: Secondary | ICD-10-CM | POA: Diagnosis not present

## 2020-04-25 DIAGNOSIS — I700 Atherosclerosis of aorta: Secondary | ICD-10-CM | POA: Diagnosis not present

## 2020-04-25 DIAGNOSIS — R030 Elevated blood-pressure reading, without diagnosis of hypertension: Secondary | ICD-10-CM | POA: Diagnosis not present

## 2020-04-25 DIAGNOSIS — I739 Peripheral vascular disease, unspecified: Secondary | ICD-10-CM | POA: Diagnosis not present

## 2020-04-25 DIAGNOSIS — J449 Chronic obstructive pulmonary disease, unspecified: Secondary | ICD-10-CM | POA: Diagnosis not present

## 2020-04-25 DIAGNOSIS — R972 Elevated prostate specific antigen [PSA]: Secondary | ICD-10-CM | POA: Diagnosis not present

## 2020-04-25 DIAGNOSIS — K219 Gastro-esophageal reflux disease without esophagitis: Secondary | ICD-10-CM | POA: Diagnosis not present

## 2020-05-01 ENCOUNTER — Inpatient Hospital Stay: Payer: Medicare Other

## 2020-05-01 ENCOUNTER — Inpatient Hospital Stay (HOSPITAL_BASED_OUTPATIENT_CLINIC_OR_DEPARTMENT_OTHER): Payer: Medicare Other | Admitting: Physician Assistant

## 2020-05-01 ENCOUNTER — Other Ambulatory Visit: Payer: Self-pay

## 2020-05-01 ENCOUNTER — Inpatient Hospital Stay: Payer: Medicare Other | Attending: Internal Medicine

## 2020-05-01 VITALS — BP 127/86 | HR 88 | Temp 97.8°F | Resp 16 | Ht 66.0 in | Wt 133.8 lb

## 2020-05-01 DIAGNOSIS — Z95828 Presence of other vascular implants and grafts: Secondary | ICD-10-CM

## 2020-05-01 DIAGNOSIS — Z5111 Encounter for antineoplastic chemotherapy: Secondary | ICD-10-CM | POA: Diagnosis not present

## 2020-05-01 DIAGNOSIS — C3491 Malignant neoplasm of unspecified part of right bronchus or lung: Secondary | ICD-10-CM | POA: Diagnosis not present

## 2020-05-01 DIAGNOSIS — I10 Essential (primary) hypertension: Secondary | ICD-10-CM | POA: Diagnosis not present

## 2020-05-01 DIAGNOSIS — J449 Chronic obstructive pulmonary disease, unspecified: Secondary | ICD-10-CM | POA: Diagnosis not present

## 2020-05-01 DIAGNOSIS — Z79899 Other long term (current) drug therapy: Secondary | ICD-10-CM | POA: Diagnosis not present

## 2020-05-01 DIAGNOSIS — Z720 Tobacco use: Secondary | ICD-10-CM | POA: Diagnosis not present

## 2020-05-01 DIAGNOSIS — C3411 Malignant neoplasm of upper lobe, right bronchus or lung: Secondary | ICD-10-CM | POA: Insufficient documentation

## 2020-05-01 DIAGNOSIS — Z5112 Encounter for antineoplastic immunotherapy: Secondary | ICD-10-CM | POA: Diagnosis not present

## 2020-05-01 DIAGNOSIS — R5382 Chronic fatigue, unspecified: Secondary | ICD-10-CM

## 2020-05-01 LAB — CMP (CANCER CENTER ONLY)
ALT: 10 U/L (ref 0–44)
AST: 15 U/L (ref 15–41)
Albumin: 4.3 g/dL (ref 3.5–5.0)
Alkaline Phosphatase: 37 U/L — ABNORMAL LOW (ref 38–126)
Anion gap: 11 (ref 5–15)
BUN: 15 mg/dL (ref 8–23)
CO2: 27 mmol/L (ref 22–32)
Calcium: 9.3 mg/dL (ref 8.9–10.3)
Chloride: 102 mmol/L (ref 98–111)
Creatinine: 1.13 mg/dL (ref 0.61–1.24)
GFR, Estimated: >60 mL/min (ref >60)
Glucose, Bld: 111 mg/dL — ABNORMAL HIGH (ref 70–99)
Potassium: 4.2 mmol/L (ref 3.5–5.1)
Sodium: 140 mmol/L (ref 135–145)
Total Bilirubin: 0.4 mg/dL (ref 0.3–1.2)
Total Protein: 6.9 g/dL (ref 6.5–8.1)

## 2020-05-01 LAB — CBC WITH DIFFERENTIAL (CANCER CENTER ONLY)
Abs Immature Granulocytes: 0.03 10*3/uL (ref 0.00–0.07)
Basophils Absolute: 0.1 10*3/uL (ref 0.0–0.1)
Basophils Relative: 1 %
Eosinophils Absolute: 0.1 10*3/uL (ref 0.0–0.5)
Eosinophils Relative: 1 %
HCT: 40.8 % (ref 39.0–52.0)
Hemoglobin: 13.5 g/dL (ref 13.0–17.0)
Immature Granulocytes: 0 %
Lymphocytes Relative: 16 %
Lymphs Abs: 1.3 10*3/uL (ref 0.7–4.0)
MCH: 27.8 pg (ref 26.0–34.0)
MCHC: 33.1 g/dL (ref 30.0–36.0)
MCV: 84.1 fL (ref 80.0–100.0)
Monocytes Absolute: 0.7 10*3/uL (ref 0.1–1.0)
Monocytes Relative: 8 %
Neutro Abs: 6.2 10*3/uL (ref 1.7–7.7)
Neutrophils Relative %: 74 %
Platelet Count: 218 10*3/uL (ref 150–400)
RBC: 4.85 MIL/uL (ref 4.22–5.81)
RDW: 15.9 % — ABNORMAL HIGH (ref 11.5–15.5)
WBC Count: 8.4 10*3/uL (ref 4.0–10.5)
nRBC: 0 % (ref 0.0–0.2)

## 2020-05-01 LAB — TSH: TSH: 1.375 u[IU]/mL (ref 0.320–4.118)

## 2020-05-01 MED ORDER — SODIUM CHLORIDE 0.9% FLUSH
10.0000 mL | Freq: Once | INTRAVENOUS | Status: AC
  Administered 2020-05-01: 10 mL via INTRAVENOUS
  Filled 2020-05-01: qty 10

## 2020-05-01 MED ORDER — SODIUM CHLORIDE 0.9 % IV SOLN
Freq: Once | INTRAVENOUS | Status: AC
  Filled 2020-05-01: qty 250

## 2020-05-01 MED ORDER — SODIUM CHLORIDE 0.9% FLUSH
10.0000 mL | INTRAVENOUS | Status: DC | PRN
  Administered 2020-05-01: 10 mL
  Filled 2020-05-01: qty 10

## 2020-05-01 MED ORDER — HEPARIN SOD (PORK) LOCK FLUSH 100 UNIT/ML IV SOLN
500.0000 [IU] | Freq: Once | INTRAVENOUS | Status: AC | PRN
  Administered 2020-05-01: 500 [IU]
  Filled 2020-05-01: qty 5

## 2020-05-01 MED ORDER — SODIUM CHLORIDE 0.9 % IV SOLN
350.0000 mg | Freq: Once | INTRAVENOUS | Status: AC
  Administered 2020-05-01: 350 mg via INTRAVENOUS
  Filled 2020-05-01: qty 7

## 2020-05-01 NOTE — Patient Instructions (Signed)
[facility] Discharge Instructions for Patients Receiving Chemotherapy  Today you received the following chemotherapy agents: Libtayo  To help prevent nausea and vomiting after your treatment, we encourage you to take your nausea medication as directed.    If you develop nausea and vomiting that is not controlled by your nausea medication, call the clinic.   BELOW ARE SYMPTOMS THAT SHOULD BE REPORTED IMMEDIATELY:  *FEVER GREATER THAN 100.5 F  *CHILLS WITH OR WITHOUT FEVER  NAUSEA AND VOMITING THAT IS NOT CONTROLLED WITH YOUR NAUSEA MEDICATION  *UNUSUAL SHORTNESS OF BREATH  *UNUSUAL BRUISING OR BLEEDING  TENDERNESS IN MOUTH AND THROAT WITH OR WITHOUT PRESENCE OF ULCERS  *URINARY PROBLEMS  *BOWEL PROBLEMS  UNUSUAL RASH Items with * indicate a potential emergency and should be followed up as soon as possible.  Feel free to call the clinic should you have any questions or concerns. The clinic phone number is [phone].  Please show the [facility] at check-in to the Emergency Department and triage nurse.

## 2020-05-02 MED ORDER — FILGRASTIM-SNDZ 300 MCG/0.5ML IJ SOSY
PREFILLED_SYRINGE | INTRAMUSCULAR | Status: AC
  Filled 2020-05-02: qty 0.5

## 2020-05-09 ENCOUNTER — Telehealth: Payer: Self-pay | Admitting: Internal Medicine

## 2020-05-09 NOTE — Telephone Encounter (Signed)
R/s 5/10 appt due to provider PAL. Called and left msg about reason his appts were decoupled. Mailed printout

## 2020-05-22 ENCOUNTER — Other Ambulatory Visit: Payer: Self-pay

## 2020-05-22 ENCOUNTER — Inpatient Hospital Stay (HOSPITAL_BASED_OUTPATIENT_CLINIC_OR_DEPARTMENT_OTHER): Payer: Medicare Other | Admitting: Internal Medicine

## 2020-05-22 ENCOUNTER — Inpatient Hospital Stay: Payer: Medicare Other | Attending: Internal Medicine

## 2020-05-22 DIAGNOSIS — C3411 Malignant neoplasm of upper lobe, right bronchus or lung: Secondary | ICD-10-CM | POA: Diagnosis not present

## 2020-05-22 DIAGNOSIS — Z5112 Encounter for antineoplastic immunotherapy: Secondary | ICD-10-CM | POA: Insufficient documentation

## 2020-05-22 DIAGNOSIS — M5416 Radiculopathy, lumbar region: Secondary | ICD-10-CM | POA: Insufficient documentation

## 2020-05-22 DIAGNOSIS — Z95828 Presence of other vascular implants and grafts: Secondary | ICD-10-CM

## 2020-05-22 DIAGNOSIS — C3490 Malignant neoplasm of unspecified part of unspecified bronchus or lung: Secondary | ICD-10-CM

## 2020-05-22 DIAGNOSIS — I10 Essential (primary) hypertension: Secondary | ICD-10-CM | POA: Insufficient documentation

## 2020-05-22 DIAGNOSIS — J910 Malignant pleural effusion: Secondary | ICD-10-CM | POA: Diagnosis not present

## 2020-05-22 DIAGNOSIS — M1990 Unspecified osteoarthritis, unspecified site: Secondary | ICD-10-CM | POA: Insufficient documentation

## 2020-05-22 DIAGNOSIS — Z5111 Encounter for antineoplastic chemotherapy: Secondary | ICD-10-CM

## 2020-05-22 DIAGNOSIS — J449 Chronic obstructive pulmonary disease, unspecified: Secondary | ICD-10-CM | POA: Diagnosis not present

## 2020-05-22 DIAGNOSIS — N486 Induration penis plastica: Secondary | ICD-10-CM | POA: Diagnosis not present

## 2020-05-22 DIAGNOSIS — K219 Gastro-esophageal reflux disease without esophagitis: Secondary | ICD-10-CM | POA: Insufficient documentation

## 2020-05-22 DIAGNOSIS — C3491 Malignant neoplasm of unspecified part of right bronchus or lung: Secondary | ICD-10-CM

## 2020-05-22 DIAGNOSIS — R5382 Chronic fatigue, unspecified: Secondary | ICD-10-CM

## 2020-05-22 LAB — CMP (CANCER CENTER ONLY)
ALT: 13 U/L (ref 0–44)
AST: 17 U/L (ref 15–41)
Albumin: 4.3 g/dL (ref 3.5–5.0)
Alkaline Phosphatase: 38 U/L (ref 38–126)
Anion gap: 12 (ref 5–15)
BUN: 22 mg/dL (ref 8–23)
CO2: 27 mmol/L (ref 22–32)
Calcium: 9.3 mg/dL (ref 8.9–10.3)
Chloride: 101 mmol/L (ref 98–111)
Creatinine: 1.19 mg/dL (ref 0.61–1.24)
GFR, Estimated: >60 mL/min (ref >60)
Glucose, Bld: 66 mg/dL — ABNORMAL LOW (ref 70–99)
Potassium: 4.1 mmol/L (ref 3.5–5.1)
Sodium: 140 mmol/L (ref 135–145)
Total Bilirubin: 0.5 mg/dL (ref 0.3–1.2)
Total Protein: 6.8 g/dL (ref 6.5–8.1)

## 2020-05-22 LAB — CBC WITH DIFFERENTIAL (CANCER CENTER ONLY)
Abs Immature Granulocytes: 0.03 10*3/uL (ref 0.00–0.07)
Basophils Absolute: 0.1 10*3/uL (ref 0.0–0.1)
Basophils Relative: 1 %
Eosinophils Absolute: 0.1 10*3/uL (ref 0.0–0.5)
Eosinophils Relative: 2 %
HCT: 41.6 % (ref 39.0–52.0)
Hemoglobin: 14.3 g/dL (ref 13.0–17.0)
Immature Granulocytes: 0 %
Lymphocytes Relative: 25 %
Lymphs Abs: 2 10*3/uL (ref 0.7–4.0)
MCH: 28.7 pg (ref 26.0–34.0)
MCHC: 34.4 g/dL (ref 30.0–36.0)
MCV: 83.5 fL (ref 80.0–100.0)
Monocytes Absolute: 0.8 10*3/uL (ref 0.1–1.0)
Monocytes Relative: 10 %
Neutro Abs: 4.9 10*3/uL (ref 1.7–7.7)
Neutrophils Relative %: 62 %
Platelet Count: 239 10*3/uL (ref 150–400)
RBC: 4.98 MIL/uL (ref 4.22–5.81)
RDW: 15.7 % — ABNORMAL HIGH (ref 11.5–15.5)
WBC Count: 7.9 10*3/uL (ref 4.0–10.5)
nRBC: 0 % (ref 0.0–0.2)

## 2020-05-22 LAB — TSH: TSH: 2.08 u[IU]/mL (ref 0.320–4.118)

## 2020-05-22 MED ORDER — HEPARIN SOD (PORK) LOCK FLUSH 100 UNIT/ML IV SOLN
500.0000 [IU] | Freq: Once | INTRAVENOUS | Status: AC
Start: 2020-05-22 — End: 2020-05-22
  Administered 2020-05-22: 500 [IU] via INTRAVENOUS
  Filled 2020-05-22: qty 5

## 2020-05-22 MED ORDER — SODIUM CHLORIDE 0.9% FLUSH
10.0000 mL | INTRAVENOUS | Status: DC | PRN
  Administered 2020-05-22: 10 mL
  Filled 2020-05-22: qty 10

## 2020-05-22 NOTE — Progress Notes (Signed)
[facility] [phone]   [phone]  OFFICE PROGRESS NOTE  [name], MD [address] [place] [address]  DIAGNOSIS: Metastatic non-small cell lung cancer initially diagnosed as stage IIB (T1b, N1, M0) non-small cell lung cancer, invasive poorly differentiated carcinoma diagnosed in June 2021 with disease recurrence in November 2021 Molecular studies are negative for EGFR mutation as well as ALK gene translocation.  PD-L1 expression was 50-100%.  This is based on the report from the [facility] trial. Molecular studies by [employer]: Showed no actionable mutations and PD-L1 expression was 90%  PRIOR THERAPY:  1) Status post right upper lobectomy with lymph node dissection on June 17, 2019 under the care of Dr. Roxan Hockey. 2) Adjuvant systemic chemotherapy with cisplatin 75 mg/M2 and Alimta 500 mg/M2 every 3 weeks.  First dose September 02, 2019. Status post 3 cycles.  His treatment was discontinued secondary to intolerance.  CURRENT THERAPY: First-line treatment with immunotherapy with Libtayo (Cempilimab) 350 mg IV every 3 weeks status post 5 cycles.  INTERVAL HISTORY: Luis [name] 70 y.o. male returns to the clinic today for follow-up visit.  The patient is feeling fine today with no concerning complaints.  He denied having any chest pain, shortness of breath, cough or hemoptysis.  He denied having any fever or chills.  He has no nausea, vomiting, diarrhea or constipation.  He denied having any skin rash or itching.  He continues to tolerate his treatment with Libtayo (Cempilimab) fairly well.  The patient is here today for evaluation before starting cycle #6.   MEDICAL HISTORY: Past Medical History:  Diagnosis Date  . Arthritis    through out body  . Cancer ([place])   . Chronic bronchitis ([place])   . Concussion   . COPD (chronic obstructive pulmonary disease) ([facility])   . Dyspnea   . Emphysema lung ([facility])   . Fatigue   .  Frozen shoulder    LEFT  . GERD (gastroesophageal reflux disease)   . Hard of hearing   . Headache    alot of days, related to spine issues  . Hypertension   . Lumbar radiculopathy 2013  . Peyronie's disease   . Prostatitis 2011   Dr. Gaynelle Arabian  . RLS (restless legs syndrome)   . Shingles 06/13/2018   shingles on back, finished with prednisone, stills has scabs and pain  . Thigh pain    Bilateral Thigh  . Thigh pain 10/13   bilateral  . Tobacco abuse 2010   still uses electronic cig/ emphysema, SOB easily  . Vertigo    per wifes update  . Wears partial dentures    upper    ALLERGIES:  is allergic to ativan [lorazepam], doxycycline, and tramadol.  MEDICATIONS:  Current Outpatient Medications  Medication Sig Dispense Refill  . acetaminophen (TYLENOL) 500 MG tablet Take 1,000 mg by mouth at bedtime as needed for moderate pain.    . Cholecalciferol (VITAMIN D) 125 MCG (5000 UT) CAPS Take 5,000 Units by mouth 3 (three) times a week.    . citalopram (CELEXA) 20 MG tablet Take 1 tablet by mouth daily.    . Fluticasone-Salmeterol (ADVAIR) 100-50 MCG/DOSE AEPB Inhale 2 puffs into the lungs 2 (two) times daily.    [name] lidocaine-prilocaine (EMLA) cream Apply 1 application topically as needed. Apply 1 tsp over port site at least 45 minutes prior to lab appointment.Do not rub in cream. Cover with plastic wrap. 30 g 0  .  Multiple Vitamin (MULTIVITAMIN) tablet Take 1 tablet by mouth daily.    . valsartan-hydrochlorothiazide (DIOVAN-HCT) 80-12.5 MG tablet Take 1 tablet by mouth daily.    . vitamin B-12 (CYANOCOBALAMIN) 500 MCG tablet 1 tablet     No current facility-administered medications for this visit.   Facility-Administered Medications Ordered in Other Visits  Medication Dose Route Frequency Provider Last Rate Last Admin  . sodium chloride flush (NS) 0.9 % injection 10 mL  10 mL Intracatheter PRN Curt Bears, MD   10 mL at 05/22/20 1401    SURGICAL HISTORY:  Past Surgical  History:  Procedure Laterality Date  . CATARACT EXTRACTION W/PHACO Right 07/08/2018   Procedure: CATARACT EXTRACTION PHACO AND INTRAOCULAR LENS PLACEMENT ([place] [facility]) RIGHT;  Surgeon: Leandrew Koyanagi, MD;  Location: [facility];  Service: Ophthalmology;  Laterality: Right;  . CATARACT EXTRACTION W/PHACO Left 07/29/2018   Procedure: CATARACT EXTRACTION PHACO AND INTRAOCULAR LENS PLACEMENT ([place]) LEFT TORIC LENS;  Surgeon: Leandrew Koyanagi, MD;  Location: [facility];  Service: Ophthalmology;  Laterality: Left;  . CHEST TUBE INSERTION Right 07/01/2019   Procedure: CHEST TUBE INSERTION;  Surgeon: [name], MD;  Location: [facility];  Service: Thoracic;  Laterality: Right;  . COLONOSCOPY    . EYE SURGERY    . INTERCOSTAL NERVE BLOCK Right 06/17/2019   Procedure: INTERCOSTAL NERVE BLOCK;  Surgeon: [name], MD;  Location: [facility];  Service: Thoracic;  Laterality: Right;  . INTERCOSTAL NERVE BLOCK Right 07/01/2019   Procedure: INTERCOSTAL NERVE BLOCK;  Surgeon: [name], MD;  Location: [facility];  Service: Thoracic;  Laterality: Right;  . IR IMAGING GUIDED PORT INSERTION  02/21/2020  . TONSILLECTOMY    . VIDEO ASSISTED THORACOSCOPY Right 07/01/2019   Procedure: VIDEO ASSISTED THORACOSCOPY - REMOVAL OF RIGHT MIDDLE LOBE BLEBS;  Surgeon: [name], MD;  Location: [facility];  Service: Thoracic;  Laterality: Right;  [name] VIDEO BRONCHOSCOPY WITH INSERTION OF INTERBRONCHIAL VALVE (IBV) Right 06/25/2019   Procedure: VIDEO BRONCHOSCOPY WITH INSERTION OF INTERBRONCHIAL VALVE (IBV); Size 7 IBV into Right Loer Lobe (RLL) Superior Segment, Size 9 IBV into RLL Basilar Segment;  Surgeon: [name], MD;  Location: [facility];  Service: Thoracic;  Laterality: Right;  [name] VIDEO BRONCHOSCOPY WITH INSERTION OF INTERBRONCHIAL VALVE (IBV) N/A 08/06/2019   Procedure: VIDEO BRONCHOSCOPY WITH REMOVAL OF INTERBRONCHIAL VALVE (IBV);  Surgeon: [name], MD;  Location: [facility]  OR;  Service: Thoracic;  Laterality: N/A;    REVIEW OF SYSTEMS:  A comprehensive review of systems was negative.   PHYSICAL EXAMINATION: General appearance: alert, cooperative and no distress Head: Normocephalic, without obvious abnormality, atraumatic Neck: no adenopathy, no JVD, supple, symmetrical, trachea midline and thyroid not enlarged, symmetric, no tenderness/mass/nodules Lymph nodes: Cervical, supraclavicular, and axillary nodes normal. Resp: clear to auscultation bilaterally Back: symmetric, no curvature. ROM normal. No CVA tenderness. Cardio: regular rate and rhythm, S1, S2 normal, no murmur, click, rub or gallop GI: soft, non-tender; bowel sounds normal; no masses,  no organomegaly Extremities: extremities normal, atraumatic, no cyanosis or edema  ECOG PERFORMANCE STATUS: 0 - Asymptomatic  Blood pressure 133/88, pulse 76, temperature 98.3 F (36.8 C), temperature source Tympanic, resp. rate 17, weight 136 lb 14.4 oz (62.1 kg), SpO2 99 %.  LABORATORY DATA: Lab Results  Component Value Date   WBC 7.9 05/22/2020   HGB 14.3 05/22/2020   HCT 41.6 05/22/2020   MCV 83.5 05/22/2020   PLT 239 05/22/2020      Chemistry  Component Value Date/Time   NA 140 05/01/2020 1411   K 4.2 05/01/2020 1411   CL 102 05/01/2020 1411   CO2 27 05/01/2020 1411   BUN 15 05/01/2020 1411   CREATININE 1.13 05/01/2020 1411      Component Value Date/Time   CALCIUM 9.3 05/01/2020 1411   ALKPHOS 37 (L) 05/01/2020 1411   AST 15 05/01/2020 1411   ALT 10 05/01/2020 1411   BILITOT 0.4 05/01/2020 1411       RADIOGRAPHIC STUDIES: No results found.  ASSESSMENT AND PLAN: This is a very pleasant 70 years old white male recently diagnosed with stage IIb (T1b, N1, M0) non-small cell lung cancer, adenocarcinoma status post right upper lobectomy with lymph node dissection under the care of Dr. Roxan Hockey on June 17, 2019. The patient had molecular studies on the [facility]chemist clinical trial that  showed negative for EGFR mutation as well as ALK gene translocation but PD-L1 expression in the range of 50-100%. The patient underwent standard adjuvant systemic chemotherapy with cisplatin 75 mg/M2 and Alimta 500 mg/M2 every 3 weeks status post 3 cycles.  His treatment was discontinued secondary to intolerance. Unfortunately repeat CT scan as well as PET scan after completion of the adjuvant chemotherapy showed concerning findings for disease recurrence with small right pleural effusion as well as multiple pleural implants consistent with metastatic disease which was confirmed with repeat biopsy. He had molecular studies by [employer] and the blood test showed no actionable mutation.  His PD-L1 expression was 90%. The patient is currently undergoing treatment with immunotherapy with Libtayo (Cempilimab) 350 mg IV every 3 weeks status post 5 cycles. The patient continues to tolerate this treatment well with no concerning adverse effects. I recommended for him to proceed with cycle #6 today as planned. I will see him back for follow-up visit in 3 weeks for evaluation with repeat CT scan of the chest for restaging of his disease. The patient was advised to call immediately if he has any concerning symptoms in the interval. The patient voices understanding of current disease status and treatment options and is in agreement with the current care plan.  All questions were answered. The patient knows to call the clinic with any problems, questions or concerns. We can certainly see the patient much sooner if necessary.  Disclaimer: This note was dictated with voice recognition software. Similar sounding words can inadvertently be transcribed and may not be corrected upon review.

## 2020-05-22 NOTE — Progress Notes (Signed)
Luis [name] presents today for port flush with labs and office visit per the provider's orders.  Patient remained stable during procedure.  Line flushing without difficulty and blood return noted.   No questions or complaints noted at this time.

## 2020-05-22 NOTE — Patient Instructions (Signed)
Implanted Port Insertion, Care After This sheet gives you information about how to care for yourself after your procedure. Your health care provider may also give you more specific instructions. If you have problems or questions, contact your health care provider. What can I expect after the procedure? After the procedure, it is common to have:  Discomfort at the port insertion site.  Bruising on the skin over the port. This should improve over 3-4 days. Follow these instructions at home: Port care  After your port is placed, you will get a manufacturer's information card. The card has information about your port. Keep this card with you at all times.  Take care of the port as told by your health care provider. Ask your health care provider if you or a family member can get training for taking care of the port at home. A home health care nurse may also take care of the port.  Make sure to remember what type of port you have. Incision care  Follow instructions from your health care provider about how to take care of your port insertion site. Make sure you: ? Wash your hands with soap and water before and after you change your bandage (dressing). If soap and water are not available, use hand sanitizer. ? Change your dressing as told by your health care provider. ? Leave stitches (sutures), skin glue, or adhesive strips in place. These skin closures may need to stay in place for 2 weeks or longer. If adhesive strip edges start to loosen and curl up, you may trim the loose edges. Do not remove adhesive strips completely unless your health care provider tells you to do that.  Check your port insertion site every day for signs of infection. Check for: ? Redness, swelling, or pain. ? Fluid or blood. ? Warmth. ? Pus or a bad smell.      Activity  Return to your normal activities as told by your health care provider. Ask your health care provider what activities are safe for you.  Do not  lift anything that is heavier than 10 lb (4.5 kg), or the limit that you are told, until your health care provider says that it is safe. General instructions  Take over-the-counter and prescription medicines only as told by your health care provider.  Do not take baths, swim, or use a hot tub until your health care provider approves. Ask your health care provider if you may take showers. You may only be allowed to take sponge baths.  Do not drive for 24 hours if you were given a sedative during your procedure.  Wear a medical alert bracelet in case of an emergency. This will tell any health care providers that you have a port.  Keep all follow-up visits as told by your health care provider. This is important. Contact a health care provider if:  You cannot flush your port with saline as directed, or you cannot draw blood from the port.  You have a fever or chills.  You have redness, swelling, or pain around your port insertion site.  You have fluid or blood coming from your port insertion site.  Your port insertion site feels warm to the touch.  You have pus or a bad smell coming from the port insertion site. Get help right away if:  You have chest pain or shortness of breath.  You have bleeding from your port that you cannot control. Summary  Take care of the port as told by your   health care provider. Keep the manufacturer's information card with you at all times.  Change your dressing as told by your health care provider.  Contact a health care provider if you have a fever or chills or if you have redness, swelling, or pain around your port insertion site.  Keep all follow-up visits as told by your health care provider. This information is not intended to replace advice given to you by your health care provider. Make sure you discuss any questions you have with your health care provider. Document Revised: 07/29/2017 Document Reviewed: 07/29/2017 Elsevier Patient Education   2021 Elsevier Inc.  

## 2020-05-23 ENCOUNTER — Other Ambulatory Visit: Payer: PRIVATE HEALTH INSURANCE

## 2020-05-23 ENCOUNTER — Ambulatory Visit: Payer: PRIVATE HEALTH INSURANCE | Admitting: Internal Medicine

## 2020-05-23 ENCOUNTER — Inpatient Hospital Stay: Payer: Medicare Other

## 2020-05-23 VITALS — BP 132/78 | HR 72 | Temp 98.2°F | Resp 18

## 2020-05-23 DIAGNOSIS — Z5112 Encounter for antineoplastic immunotherapy: Secondary | ICD-10-CM | POA: Diagnosis not present

## 2020-05-23 DIAGNOSIS — M1990 Unspecified osteoarthritis, unspecified site: Secondary | ICD-10-CM | POA: Diagnosis not present

## 2020-05-23 DIAGNOSIS — J449 Chronic obstructive pulmonary disease, unspecified: Secondary | ICD-10-CM | POA: Diagnosis not present

## 2020-05-23 DIAGNOSIS — C3411 Malignant neoplasm of upper lobe, right bronchus or lung: Secondary | ICD-10-CM | POA: Diagnosis not present

## 2020-05-23 DIAGNOSIS — J910 Malignant pleural effusion: Secondary | ICD-10-CM | POA: Diagnosis not present

## 2020-05-23 DIAGNOSIS — K219 Gastro-esophageal reflux disease without esophagitis: Secondary | ICD-10-CM | POA: Diagnosis not present

## 2020-05-23 DIAGNOSIS — C3491 Malignant neoplasm of unspecified part of right bronchus or lung: Secondary | ICD-10-CM

## 2020-05-23 MED ORDER — SODIUM CHLORIDE 0.9 % IV SOLN
Freq: Once | INTRAVENOUS | Status: AC
  Filled 2020-05-23: qty 250

## 2020-05-23 MED ORDER — SODIUM CHLORIDE 0.9 % IV SOLN
350.0000 mg | Freq: Once | INTRAVENOUS | Status: AC
  Administered 2020-05-23: 350 mg via INTRAVENOUS
  Filled 2020-05-23: qty 7

## 2020-05-31 DIAGNOSIS — C3410 Malignant neoplasm of upper lobe, unspecified bronchus or lung: Secondary | ICD-10-CM | POA: Diagnosis not present

## 2020-05-31 DIAGNOSIS — J441 Chronic obstructive pulmonary disease with (acute) exacerbation: Secondary | ICD-10-CM | POA: Diagnosis not present

## 2020-05-31 DIAGNOSIS — K219 Gastro-esophageal reflux disease without esophagitis: Secondary | ICD-10-CM | POA: Diagnosis not present

## 2020-05-31 DIAGNOSIS — J449 Chronic obstructive pulmonary disease, unspecified: Secondary | ICD-10-CM | POA: Diagnosis not present

## 2020-06-06 ENCOUNTER — Encounter: Payer: Self-pay | Admitting: Internal Medicine

## 2020-06-11 DIAGNOSIS — Z20822 Contact with and (suspected) exposure to covid-19: Secondary | ICD-10-CM | POA: Diagnosis not present

## 2020-06-13 ENCOUNTER — Inpatient Hospital Stay: Payer: Medicare Other

## 2020-06-13 ENCOUNTER — Other Ambulatory Visit: Payer: Self-pay

## 2020-06-13 ENCOUNTER — Encounter: Payer: Self-pay | Admitting: Physician Assistant

## 2020-06-13 ENCOUNTER — Inpatient Hospital Stay (HOSPITAL_BASED_OUTPATIENT_CLINIC_OR_DEPARTMENT_OTHER): Payer: Medicare Other | Admitting: Physician Assistant

## 2020-06-13 VITALS — BP 133/76 | HR 72 | Temp 98.0°F | Resp 18 | Ht 66.0 in | Wt 142.9 lb

## 2020-06-13 DIAGNOSIS — R5382 Chronic fatigue, unspecified: Secondary | ICD-10-CM

## 2020-06-13 DIAGNOSIS — M1990 Unspecified osteoarthritis, unspecified site: Secondary | ICD-10-CM | POA: Diagnosis not present

## 2020-06-13 DIAGNOSIS — J449 Chronic obstructive pulmonary disease, unspecified: Secondary | ICD-10-CM | POA: Diagnosis not present

## 2020-06-13 DIAGNOSIS — Z5112 Encounter for antineoplastic immunotherapy: Secondary | ICD-10-CM

## 2020-06-13 DIAGNOSIS — C3491 Malignant neoplasm of unspecified part of right bronchus or lung: Secondary | ICD-10-CM | POA: Diagnosis not present

## 2020-06-13 DIAGNOSIS — C3411 Malignant neoplasm of upper lobe, right bronchus or lung: Secondary | ICD-10-CM | POA: Diagnosis not present

## 2020-06-13 DIAGNOSIS — J910 Malignant pleural effusion: Secondary | ICD-10-CM | POA: Diagnosis not present

## 2020-06-13 DIAGNOSIS — K219 Gastro-esophageal reflux disease without esophagitis: Secondary | ICD-10-CM | POA: Diagnosis not present

## 2020-06-13 LAB — CBC WITH DIFFERENTIAL (CANCER CENTER ONLY)
Abs Immature Granulocytes: 0.03 10*3/uL (ref 0.00–0.07)
Basophils Absolute: 0.1 10*3/uL (ref 0.0–0.1)
Basophils Relative: 1 %
Eosinophils Absolute: 0.2 10*3/uL (ref 0.0–0.5)
Eosinophils Relative: 2 %
HCT: 39.8 % (ref 39.0–52.0)
Hemoglobin: 14.1 g/dL (ref 13.0–17.0)
Immature Granulocytes: 0 %
Lymphocytes Relative: 21 %
Lymphs Abs: 1.7 10*3/uL (ref 0.7–4.0)
MCH: 30.2 pg (ref 26.0–34.0)
MCHC: 35.4 g/dL (ref 30.0–36.0)
MCV: 85.2 fL (ref 80.0–100.0)
Monocytes Absolute: 0.7 10*3/uL (ref 0.1–1.0)
Monocytes Relative: 9 %
Neutro Abs: 5.5 10*3/uL (ref 1.7–7.7)
Neutrophils Relative %: 67 %
Platelet Count: 240 10*3/uL (ref 150–400)
RBC: 4.67 MIL/uL (ref 4.22–5.81)
RDW: 15 % (ref 11.5–15.5)
WBC Count: 8.2 10*3/uL (ref 4.0–10.5)
nRBC: 0 % (ref 0.0–0.2)

## 2020-06-13 LAB — TSH: TSH: 1.84 u[IU]/mL (ref 0.320–4.118)

## 2020-06-13 LAB — CMP (CANCER CENTER ONLY)
ALT: 10 U/L (ref 0–44)
AST: 16 U/L (ref 15–41)
Albumin: 4.1 g/dL (ref 3.5–5.0)
Alkaline Phosphatase: 40 U/L (ref 38–126)
Anion gap: 9 (ref 5–15)
BUN: 18 mg/dL (ref 8–23)
CO2: 29 mmol/L (ref 22–32)
Calcium: 9.5 mg/dL (ref 8.9–10.3)
Chloride: 100 mmol/L (ref 98–111)
Creatinine: 1.11 mg/dL (ref 0.61–1.24)
GFR, Estimated: >60 mL/min (ref >60)
Glucose, Bld: 71 mg/dL (ref 70–99)
Potassium: 4.4 mmol/L (ref 3.5–5.1)
Sodium: 138 mmol/L (ref 135–145)
Total Bilirubin: 0.5 mg/dL (ref 0.3–1.2)
Total Protein: 6.8 g/dL (ref 6.5–8.1)

## 2020-06-13 MED ORDER — SODIUM CHLORIDE 0.9 % IV SOLN
Freq: Once | INTRAVENOUS | Status: AC
  Filled 2020-06-13: qty 250

## 2020-06-13 MED ORDER — SODIUM CHLORIDE 0.9 % IV SOLN
350.0000 mg | Freq: Once | INTRAVENOUS | Status: AC
  Administered 2020-06-13: 350 mg via INTRAVENOUS
  Filled 2020-06-13: qty 7

## 2020-06-13 MED ORDER — HEPARIN SOD (PORK) LOCK FLUSH 100 UNIT/ML IV SOLN
500.0000 [IU] | Freq: Once | INTRAVENOUS | Status: AC | PRN
  Administered 2020-06-13: 500 [IU]
  Filled 2020-06-13: qty 5

## 2020-06-13 MED ORDER — SODIUM CHLORIDE 0.9% FLUSH
10.0000 mL | INTRAVENOUS | Status: DC | PRN
  Administered 2020-06-13: 10 mL
  Filled 2020-06-13: qty 10

## 2020-06-13 NOTE — Patient Instructions (Signed)
[name]   Discharge Instructions: Thank you for choosing [facility] to provide your oncology and hematology care.   If you have a lab appointment with the [facility], please go directly to the [facility] and check in at the registration area.   Wear comfortable clothing and clothing appropriate for easy access to any Portacath or PICC line.   We strive to give you quality time with your provider. You may need to reschedule your appointment if you arrive late (15 or more minutes).  Arriving late affects you and other patients whose appointments are after yours.  Also, if you miss three or more appointments without notifying the office, you may be dismissed from the clinic at the provider's discretion.      For prescription refill requests, have your pharmacy contact our office and allow 72 hours for refills to be completed.    Today you received the following chemotherapy and/or immunotherapy agents: Cemiplimab (Libtayo)      To help prevent nausea and vomiting after your treatment, we encourage you to take your nausea medication as directed.  BELOW ARE SYMPTOMS THAT SHOULD BE REPORTED IMMEDIATELY: . *FEVER GREATER THAN 100.4 F (38 C) OR HIGHER . *CHILLS OR SWEATING . *NAUSEA AND VOMITING THAT IS NOT CONTROLLED WITH YOUR NAUSEA MEDICATION . *UNUSUAL SHORTNESS OF BREATH . *UNUSUAL BRUISING OR BLEEDING . *URINARY PROBLEMS (pain or burning when urinating, or frequent urination) . *BOWEL PROBLEMS (unusual diarrhea, constipation, pain near the anus) . TENDERNESS IN MOUTH AND THROAT WITH OR WITHOUT PRESENCE OF ULCERS (sore throat, sores in mouth, or a toothache) . UNUSUAL RASH, SWELLING OR PAIN  . UNUSUAL VAGINAL DISCHARGE OR ITCHING   Items with * indicate a potential emergency and should be followed up as soon as possible or go to the Emergency Department if any problems should occur.  Please show the CHEMOTHERAPY ALERT CARD or  IMMUNOTHERAPY ALERT CARD at check-in to the Emergency Department and triage nurse.  Should you have questions after your visit or need to cancel or reschedule your appointment, please contact [facility]  Dept: [phone]  and follow the prompts.  Office hours are 8:00 a.m. to 4:30 p.m. Monday - Friday. Please note that voicemails left after 4:00 p.m. may not be returned until the following business day.  We are closed weekends and major holidays. You have access to a nurse at all times for urgent questions. Please call the main number to the clinic Dept: [phone] and follow the prompts.   For any non-urgent questions, you may also contact your provider using MyChart. We now offer e-Visits for anyone 55 and older to request care online for non-urgent symptoms. For details visit [email or web address].   Also download the MyChart app! Go to the app store, search "MyChart", open the app, select [facility], and log in with your MyChart username and password.  Due to Covid, a mask is required upon entering the hospital/clinic. If you do not have a mask, one will be given to you upon arrival. For doctor visits, patients may have 1 support person aged 1 or older with them. For treatment visits, patients cannot have anyone with them due to current Covid guidelines and our immunocompromised population.

## 2020-06-13 NOTE — Progress Notes (Signed)
[facility] OFFICE PROGRESS NOTE  Luis Huddle, MD [place] [address] [place] [address]  DIAGNOSIS: Metastatic non-small cell lung cancer initially diagnosed as stage IIB (T1b, N1, M0) non-small cell lung cancer, invasive poorly differentiated carcinoma diagnosed in June 2021 with disease recurrence in November 2021 Molecular studies are negative for EGFR mutation as well as ALK gene translocation. PD-L1 expression was 50-100%. This is based on the report from the [facility] trial. Molecular studies by [facility] [employer]: Showed no actionable mutations and PD-L1 expression was 90%  PRIOR THERAPY:  1) Status post right upper lobectomy with lymph node dissection on June 17, 2019 under the care of Dr. Roxan Hockey. 2) Adjuvant systemic chemotherapy with cisplatin 75 mg/M2 and Alimta 500 mg/M2 every 3 weeks. First dose September 02, 2019. Status post 3 cycles. His treatment was discontinued secondary to intolerance.  CURRENT THERAPY: First-line treatment with immunotherapy with Libtayo (Cempilimab) 350 mg IV every 3 weeks status post6cycles. First dose on 01/24/20.  INTERVAL HISTORY: Luis [name] 70 y.o. male returns to the clinic for a follow up visit. The patient is feeling well today without any concerning complaints. His wife had COVID-19. The patient was tested on 06/11/20 and was negative. He does not have any symptoms. The patient continues to tolerate treatment with immunotherapy well without any adverse effects. Denies any fever, chills, night sweats, or weight loss. Denies any chest pain or hemoptysis. He quit vapping since he was seen by me at his last visit. He reports his mild baseline cough and baseline shortness of breath with exertion due to COPD. Denies any nausea, vomiting, diarrhea, or constipation. Denies any headache or visual changes. Denies any rashes or skin changes. He was supposed to have a restaging CT scan prior to his visit but is is not scheduled  until 06/15/20. The patient is here today for evaluation prior to starting cycle # 7   MEDICAL HISTORY: Past Medical History:  Diagnosis Date  . Arthritis    through out body  . Cancer ([place])   . Chronic bronchitis ([place])   . Concussion   . COPD (chronic obstructive pulmonary disease) ([place])   . Dyspnea   . Emphysema lung ([place])   . Fatigue   . Frozen shoulder    LEFT  . GERD (gastroesophageal reflux disease)   . Hard of hearing   . Headache    alot of days, related to spine issues  . Hypertension   . Lumbar radiculopathy 2013  . Peyronie's disease   . Prostatitis 2011   Dr. Gaynelle Arabian  . RLS (restless legs syndrome)   . Shingles 06/13/2018   shingles on back, finished with prednisone, stills has scabs and pain  . Thigh pain    Bilateral Thigh  . Thigh pain 10/13   bilateral  . Tobacco abuse 2010   still uses electronic cig/ emphysema, SOB easily  . Vertigo    per wifes update  . Wears partial dentures    upper    ALLERGIES:  is allergic to ativan [lorazepam], doxycycline, and tramadol.  MEDICATIONS:  Current Outpatient Medications  Medication Sig Dispense Refill  . acetaminophen (TYLENOL) 500 MG tablet Take 1,000 mg by mouth at bedtime as needed for moderate pain.    . Cholecalciferol (VITAMIN D) 125 MCG (5000 UT) CAPS Take 5,000 Units by mouth 3 (three) times a week.    . citalopram (CELEXA) 20 MG tablet Take 1 tablet by mouth daily.    . Fluticasone-Salmeterol (ADVAIR)  100-50 MCG/DOSE AEPB Inhale 2 puffs into the lungs 2 (two) times daily.    [name] lidocaine-prilocaine (EMLA) cream Apply 1 application topically as needed. Apply 1 tsp over port site at least 45 minutes prior to lab appointment.Do not rub in cream. Cover with plastic wrap. 30 g 0  . Multiple Vitamin (MULTIVITAMIN) tablet Take 1 tablet by mouth daily.    . valsartan-hydrochlorothiazide (DIOVAN-HCT) 80-12.5 MG tablet Take 1 tablet by mouth daily.    . vitamin B-12 (CYANOCOBALAMIN) 500 MCG tablet 1 tablet      No current facility-administered medications for this visit.   Facility-Administered Medications Ordered in Other Visits  Medication Dose Route Frequency Provider Last Rate Last Admin  . 0.9 %  sodium chloride infusion   Intravenous Once Curt Bears, MD      . cemiplimab-rwlc (LIBTAYO) 350 mg in sodium chloride 0.9 % 100 mL chemo infusion  350 mg Intravenous Once Curt Bears, MD      . heparin lock flush 100 unit/mL  500 Units Intracatheter Once PRN Curt Bears, MD      . sodium chloride flush (NS) 0.9 % injection 10 mL  10 mL Intracatheter PRN Curt Bears, MD        SURGICAL HISTORY:  Past Surgical History:  Procedure Laterality Date  . CATARACT EXTRACTION W/PHACO Right 07/08/2018   Procedure: CATARACT EXTRACTION PHACO AND INTRAOCULAR LENS PLACEMENT ([place]) RIGHT;  Surgeon: Leandrew Koyanagi, MD;  Location: [facility];  Service: Ophthalmology;  Laterality: Right;  . CATARACT EXTRACTION W/PHACO Left 07/29/2018   Procedure: CATARACT EXTRACTION PHACO AND INTRAOCULAR LENS PLACEMENT ([place]) LEFT TORIC LENS;  Surgeon: Leandrew Koyanagi, MD;  Location: [facility];  Service: Ophthalmology;  Laterality: Left;  . CHEST TUBE INSERTION Right 07/01/2019   Procedure: CHEST TUBE INSERTION;  Surgeon: [name], MD;  Location: [facility];  Service: Thoracic;  Laterality: Right;  . COLONOSCOPY    . EYE SURGERY    . INTERCOSTAL NERVE BLOCK Right 06/17/2019   Procedure: INTERCOSTAL NERVE BLOCK;  Surgeon: [name], MD;  Location: [facility];  Service: Thoracic;  Laterality: Right;  . INTERCOSTAL NERVE BLOCK Right 07/01/2019   Procedure: INTERCOSTAL NERVE BLOCK;  Surgeon: [name], MD;  Location: [facility];  Service: Thoracic;  Laterality: Right;  . IR IMAGING GUIDED PORT INSERTION  02/21/2020  . TONSILLECTOMY    . VIDEO ASSISTED THORACOSCOPY Right 07/01/2019   Procedure: VIDEO ASSISTED THORACOSCOPY - REMOVAL OF RIGHT MIDDLE LOBE BLEBS;  Surgeon:  [name], MD;  Location: [facility];  Service: Thoracic;  Laterality: Right;  [name] VIDEO BRONCHOSCOPY WITH INSERTION OF INTERBRONCHIAL VALVE (IBV) Right 06/25/2019   Procedure: VIDEO BRONCHOSCOPY WITH INSERTION OF INTERBRONCHIAL VALVE (IBV); Size 7 IBV into Right Loer Lobe (RLL) Superior Segment, Size 9 IBV into RLL Basilar Segment;  Surgeon: [name], MD;  Location: [facility];  Service: Thoracic;  Laterality: Right;  [name] VIDEO BRONCHOSCOPY WITH INSERTION OF INTERBRONCHIAL VALVE (IBV) N/A 08/06/2019   Procedure: VIDEO BRONCHOSCOPY WITH REMOVAL OF INTERBRONCHIAL VALVE (IBV);  Surgeon: [name], MD;  [facility];  Service: Thoracic;  Laterality: N/A;    REVIEW OF SYSTEMS:   Review of Systems  Constitutional: Negative for appetite change, chills, fever and unexpected weight change.  HENT: Negative for mouth sores, nosebleeds, sore throat and trouble swallowing.   Eyes: Negative for eye problems and icterus.  Respiratory: Positive for baseline shortness of breath with exertion and cough. Negative for hemoptysis and wheezing.  Cardiovascular: Negative for chest pain and leg swelling.  Gastrointestinal: Negative for abdominal pain, constipation, diarrhea, nausea and vomiting.  Genitourinary: Negative for bladder incontinence, difficulty urinating, dysuria, frequency and hematuria.   Musculoskeletal: Negative for back pain, gait problem, neck pain and neck stiffness.  Skin: Negative for itching and rash.  Neurological: Negative for dizziness, extremity weakness, gait problem, headaches, light-headedness and seizures.  Hematological: Negative for adenopathy. Does not bruise/bleed easily.  Psychiatric/Behavioral: Negative for confusion, depression and sleep disturbance. The patient is not nervous/anxious.      PHYSICAL EXAMINATION:  Blood pressure 133/76, pulse 72, temperature 98 F (36.7 C), temperature source Tympanic, resp. rate 18, height _0  (1.676 m), weight 142 lb  14.4 oz (64.8 kg), SpO2 98 %.  ECOG PERFORMANCE STATUS: 1 - Symptomatic but completely ambulatory  Physical Exam  Constitutional: Oriented to person, place, and time and well-developed, well-nourished, and in no distress.  HENT:  Head: Normocephalic and atraumatic.  Mouth/Throat: Oropharynx is clear and moist. No oropharyngeal exudate.  Eyes: Conjunctivae are normal. Right eye exhibits no discharge. Left eye exhibits no discharge. No scleral icterus.  Neck: Normal range of motion. Neck supple.  Cardiovascular: Normal rate, regular rhythm, normal heart sounds and intact distal pulses.   Pulmonary/Chest: Effort normal. Normal breath sounds bilaterally. No respiratory distress. No wheezes. No rales.  Abdominal: Soft. Bowel sounds are normal. Exhibits no distension and no mass. There is no tenderness.  Musculoskeletal: Normal range of motion. Exhibits no edema.  Lymphadenopathy:    No cervical adenopathy.  Neurological: Alert and oriented to person, place, and time. Exhibits normal muscle tone. Gait normal. Coordination normal.  Skin: Skin is warm and dry. No rash noted. Not diaphoretic. No erythema. No pallor.  Psychiatric: Mood, memory and judgment normal.  Vitals reviewed.  LABORATORY DATA: Lab Results  Component Value Date   WBC 8.2 06/13/2020   HGB 14.1 06/13/2020   HCT 39.8 06/13/2020   MCV 85.2 06/13/2020   PLT 240 06/13/2020      Chemistry      Component Value Date/Time   NA 138 06/13/2020 1114   K 4.4 06/13/2020 1114   CL 100 06/13/2020 1114   CO2 29 06/13/2020 1114   BUN 18 06/13/2020 1114   CREATININE 1.11 06/13/2020 1114      Component Value Date/Time   CALCIUM 9.5 06/13/2020 1114   ALKPHOS 40 06/13/2020 1114   AST 16 06/13/2020 1114   ALT 10 06/13/2020 1114   BILITOT 0.5 06/13/2020 1114       RADIOGRAPHIC STUDIES:  No results found.   ASSESSMENT/PLAN:  This is a very pleasant 70 year old Caucasian male with metastatic lung cancer initially diagnosed  as stage IIb (T1b, N1, M0) non-small cell lung cancer, adenocarcinoma. He is status post a right upper lobe lobectomy with lymph node dissection under the care of Dr. Roxan Hockey. This was performed on June 17, 2019. He has no actionable mutations. His PD-L1 expression is 90%. He had evidence for metastatic disease in November 2021 with small right pleural effusion as well as multiple right pleural implants.   The patient completed 3 of the 4 planned adjuvant chemotherapy cycles withcisplatin 75 mg per metered square and Alimta 500 mg/m.This was discontinued after cycle #3 due to intolerance.  When the patient was found to have evidence for disease recurrence, the patient was started on immunotherapy with Libtayo (Cempilimab) 350 mg IV every 3 weeks status post6cycles. He is tolerating this well.    Labs were reviewed. Recommend that  he proceed with cycle #7 today as scheduled.   We will see him back for a follow up visit in 3 weeks for evaluation before starting cycle #8.   He will obtain his restaging CT scan as scheduled on 06/15/20.   The patient was advised to call immediately if he has any concerning symptoms in the interval. The patient voices understanding of current disease status and treatment options and is in agreement with the current care plan. All questions were answered. The patient knows to call the clinic with any problems, questions or concerns. We can certainly see the patient much sooner if necessary  No orders of the defined types were placed in this encounter.    I spent 20-29 mins in this encounter.   Jojo Geving L Taneya Conkel, PA-C 06/13/20

## 2020-06-14 ENCOUNTER — Telehealth: Payer: Self-pay | Admitting: Physician Assistant

## 2020-06-14 ENCOUNTER — Encounter: Payer: Self-pay | Admitting: Internal Medicine

## 2020-06-14 NOTE — Telephone Encounter (Signed)
Scheduled per los. Called and left msg. Mailed printout  °

## 2020-06-15 ENCOUNTER — Other Ambulatory Visit: Payer: Self-pay

## 2020-06-15 ENCOUNTER — Ambulatory Visit (HOSPITAL_COMMUNITY)
Admission: RE | Admit: 2020-06-15 | Discharge: 2020-06-15 | Disposition: A | Discharge status: Home / Self Care | Payer: Medicare Other | Source: Ambulatory Visit | Attending: Internal Medicine | Admitting: Internal Medicine

## 2020-06-15 DIAGNOSIS — R918 Other nonspecific abnormal finding of lung field: Secondary | ICD-10-CM | POA: Diagnosis not present

## 2020-06-15 DIAGNOSIS — C3490 Malignant neoplasm of unspecified part of unspecified bronchus or lung: Secondary | ICD-10-CM | POA: Insufficient documentation

## 2020-06-15 DIAGNOSIS — J432 Centrilobular emphysema: Secondary | ICD-10-CM | POA: Diagnosis not present

## 2020-06-15 DIAGNOSIS — Z9889 Other specified postprocedural states: Secondary | ICD-10-CM | POA: Diagnosis not present

## 2020-06-15 MED ORDER — IOHEXOL 300 MG/ML  SOLN
75.0000 mL | Freq: Once | INTRAMUSCULAR | Status: AC | PRN
  Administered 2020-06-15: 75 mL via INTRAVENOUS

## 2020-06-15 MED ORDER — HEPARIN SOD (PORK) LOCK FLUSH 100 UNIT/ML IV SOLN
500.0000 [IU] | Freq: Once | INTRAVENOUS | Status: AC
  Administered 2020-06-15: 500 [IU] via INTRAVENOUS

## 2020-06-28 NOTE — Progress Notes (Signed)
[facility] OFFICE PROGRESS NOTE  [name], MD [place] [address]  [place] [id number]  DIAGNOSIS: Metastatic non-small cell lung cancer initially diagnosed as stage IIB (T1b, N1, M0) non-small cell lung cancer, invasive poorly differentiated carcinoma diagnosed in June 2021 with disease recurrence in November 2021 Molecular studies are negative for EGFR mutation as well as ALK gene translocation.  PD-L1 expression was 50-100%.  This is based on the report from the [facility] trial. Molecular studies by [employer]: Showed no actionable mutations and PD-L1 expression was 90%  PRIOR THERAPY:  1) Status post right upper lobectomy with lymph node dissection on June 17, 2019 under the care of Dr. Roxan Hockey. 2) Adjuvant systemic chemotherapy with cisplatin 75 mg/M2 and Alimta 500 mg/M2 every 3 weeks.  First dose September 02, 2019. Status post 3 cycles.  His treatment was discontinued secondary to intolerance.  CURRENT THERAPY: First-line treatment with immunotherapy with Libtayo (Cempilimab) 350 mg IV every 3 weeks status post 7 cycles. First dose on 01/24/20.   INTERVAL HISTORY: Luis [name] 70 y.o. male returns to the clinic for a follow up visit. The patient is feeling well today without any concerning complaints except he states he has been having more loose stool over the last 2-3 weeks. He states it is not unusual for him to have "softer" stool but feels like it may be increased lately. Overall, he feels well and denies fevers, chills, abdominal pain, or presence of blood int he stool. He states it occurs almost daily about 3-4x per day but he states he does not have a large quantity that comes out. He has not tried taking any imodium.  The patient continues to tolerate treatment with immunotherapy well without any adverse effects except he has some dry skin and itching and a few scattered skin lesions. Denies any night sweats or weight loss. Denies any chest pain or  hemoptysis except for the right sided chest discomfort which he will rarely taken tylenol as his pain is manageable. He quit vapping since I saw him for a follow up visit a few weeks ago. He reports his baseline shortness of breath with exertion due to COPD. Denies significant cough. Denies any nausea, vomiting, or constipation. Denies any headache or visual changes.  He recently had a restaging CT scan performed. The patient is here today for evaluation prior to starting cycle # 8.    MEDICAL HISTORY: Past Medical History:  Diagnosis Date   Arthritis    through out body   Cancer ([place])    Chronic bronchitis (HCC)    Concussion    COPD (chronic obstructive pulmonary disease) (HCC)    Dyspnea    Emphysema lung (HCC)    Fatigue    Frozen shoulder    LEFT   GERD (gastroesophageal reflux disease)    Hard of hearing    Headache    alot of days, related to spine issues   Hypertension    Lumbar radiculopathy 2013   Peyronie's disease    Prostatitis 2011   Dr. Gaynelle Arabian   RLS (restless legs syndrome)    Shingles 06/13/2018   shingles on back, finished with prednisone, stills has scabs and pain   Thigh pain    Bilateral Thigh   Thigh pain 10/13   bilateral   Tobacco abuse 2010   still uses electronic cig/ emphysema, SOB easily   Vertigo    per wifes update   Wears partial dentures    upper  ALLERGIES:  is allergic to ativan [lorazepam], doxycycline, and tramadol.  MEDICATIONS:  Current Outpatient Medications  Medication Sig Dispense Refill   acetaminophen (TYLENOL) 500 MG tablet Take 1,000 mg by mouth at bedtime as needed for moderate pain.     Cholecalciferol (VITAMIN D) 125 MCG (5000 UT) CAPS Take 5,000 Units by mouth 3 (three) times a week.     citalopram (CELEXA) 20 MG tablet Take 1 tablet by mouth daily.     Fluticasone-Salmeterol (ADVAIR) 100-50 MCG/DOSE AEPB Inhale 2 puffs into the lungs 2 (two) times daily.     lidocaine-prilocaine (EMLA) cream Apply 1 application  topically as needed. Apply 1 tsp over port site at least 45 minutes prior to lab appointment.Do not rub in cream. Cover with plastic wrap. 30 g 0   Multiple Vitamin (MULTIVITAMIN) tablet Take 1 tablet by mouth daily.     valsartan-hydrochlorothiazide (DIOVAN-HCT) 80-12.5 MG tablet Take 1 tablet by mouth daily.     vitamin B-12 (CYANOCOBALAMIN) 500 MCG tablet 1 tablet     No current facility-administered medications for this visit.    SURGICAL HISTORY:  Past Surgical History:  Procedure Laterality Date   CATARACT EXTRACTION W/PHACO Right 07/08/2018   Procedure: CATARACT EXTRACTION PHACO AND INTRAOCULAR LENS PLACEMENT ([facility]) RIGHT;  Surgeon: Leandrew Koyanagi, MD;  Location: [facility];  Service: Ophthalmology;  Laterality: Right;   CATARACT EXTRACTION W/PHACO Left 07/29/2018   Procedure: CATARACT EXTRACTION PHACO AND INTRAOCULAR LENS PLACEMENT ([facility]) LEFT TORIC LENS;  Surgeon: Leandrew Koyanagi, MD;  Location: [facility];  Service: Ophthalmology;  Laterality: Left;   CHEST TUBE INSERTION Right 07/01/2019   Procedure: CHEST TUBE INSERTION;  Surgeon: [name], MD;  Location: [facility];  Service: Thoracic;  Laterality: Right;   COLONOSCOPY     EYE SURGERY     INTERCOSTAL NERVE BLOCK Right 06/17/2019   Procedure: INTERCOSTAL NERVE BLOCK;  Surgeon: [name], MD;  Location: [facility];  Service: Thoracic;  Laterality: Right;   INTERCOSTAL NERVE BLOCK Right 07/01/2019   Procedure: INTERCOSTAL NERVE BLOCK;  Surgeon: [name], MD;  Location: [facility];  Service: Thoracic;  Laterality: Right;   IR IMAGING GUIDED PORT INSERTION  02/21/2020   TONSILLECTOMY     VIDEO ASSISTED THORACOSCOPY Right 07/01/2019   Procedure: VIDEO ASSISTED THORACOSCOPY - REMOVAL OF RIGHT MIDDLE LOBE BLEBS;  Surgeon: [name], MD;  Location: [facility];  Service: Thoracic;  Laterality: Right;   VIDEO BRONCHOSCOPY WITH INSERTION OF INTERBRONCHIAL VALVE (IBV) Right 06/25/2019    Procedure: VIDEO BRONCHOSCOPY WITH INSERTION OF INTERBRONCHIAL VALVE (IBV); Size 7 IBV into Right Loer Lobe (RLL) Superior Segment, Size 9 IBV into RLL Basilar Segment;  Surgeon: [name], MD;  Location: [facility];  Service: Thoracic;  Laterality: Right;   VIDEO BRONCHOSCOPY WITH INSERTION OF INTERBRONCHIAL VALVE (IBV) N/A 08/06/2019   Procedure: VIDEO BRONCHOSCOPY WITH REMOVAL OF INTERBRONCHIAL VALVE (IBV);  Surgeon: [name], MD;  Location: [facility];  Service: Thoracic;  Laterality: N/A;    REVIEW OF SYSTEMS:   Review of Systems  Constitutional: Negative for appetite change, chills, fever and unexpected weight change. HENT: Negative for mouth sores, nosebleeds, sore throat and trouble swallowing.   Eyes: Negative for eye problems and icterus. Respiratory: Positive for baseline shortness of breath with exertion. Negative for cough, hemoptysis and wheezing.   Cardiovascular: Negative for chest pain and leg swelling. Gastrointestinal: Positive for loose stool. Negative for abdominal pain, constipation, nausea and vomiting. Genitourinary: Negative for bladder incontinence,  difficulty urinating, dysuria, frequency and hematuria.   Musculoskeletal: Negative for back pain, gait problem, neck pain and neck stiffness. Skin: Positive for itching and a few scattered skin lesions on upper extremities.  Neurological: Negative for dizziness, extremity weakness, gait problem, headaches, light-headedness and seizures. Hematological: Negative for adenopathy. Does not bruise/bleed easily. Psychiatric/Behavioral: Negative for confusion, depression and sleep disturbance. The patient is not nervous/anxious.   PHYSICAL EXAMINATION:  Blood pressure 118/72, pulse 74, temperature 97.6 F (36.4 C), temperature source Tympanic, resp. rate 19, height $RemoveBe'5\' 6"'BPdvHCnZX$  (1.676 m), weight 147 lb 12.8 oz (67 kg), SpO2 98 %.  ECOG PERFORMANCE STATUS: 1  Physical Exam  Constitutional: Oriented to person, place,  and time and well-developed, well-nourished, and in no distress. HENT: Head: Normocephalic and atraumatic. Mouth/Throat: Oropharynx is clear and moist. No oropharyngeal exudate. Eyes: Conjunctivae are normal. Right eye exhibits no discharge. Left eye exhibits no discharge. No scleral icterus. Neck: Normal range of motion. Neck supple. Cardiovascular: Normal rate, regular rhythm, normal heart sounds and intact distal pulses.   Pulmonary/Chest: Effort normal. Normal breath sounds bilaterally. No respiratory distress. No wheezes. No rales. Abdominal: Soft. Bowel sounds are normal. Exhibits no distension and no mass. There is no tenderness.  Musculoskeletal: Normal range of motion. Exhibits no edema.  Lymphadenopathy:    No cervical adenopathy.  Neurological: Alert and oriented to person, place, and time. Exhibits normal muscle tone. Gait normal. Coordination normal. Skin: Positive for dry skin and a few scattered skin lesions. Skin is warm and dry. No Not diaphoretic. No erythema. No pallor.  Psychiatric: Mood, memory and judgment normal. Vitals reviewed.  LABORATORY DATA: Lab Results  Component Value Date   WBC 8.5 07/03/2020   HGB 14.1 07/03/2020   HCT 41.0 07/03/2020   MCV 88.2 07/03/2020   PLT 195 07/03/2020      Chemistry      Component Value Date/Time   NA 138 06/13/2020 1114   K 4.4 06/13/2020 1114   CL 100 06/13/2020 1114   CO2 29 06/13/2020 1114   BUN 18 06/13/2020 1114   CREATININE 1.11 06/13/2020 1114      Component Value Date/Time   CALCIUM 9.5 06/13/2020 1114   ALKPHOS 40 06/13/2020 1114   AST 16 06/13/2020 1114   ALT 10 06/13/2020 1114   BILITOT 0.5 06/13/2020 1114       RADIOGRAPHIC STUDIES:  CT Chest W Contrast  Result Date: 06/15/2020 CLINICAL DATA:  Follow-up metastatic non-small cell lung carcinoma. Previous surgery and chemotherapy. Ongoing immunotherapy. EXAM: CT CHEST WITH CONTRAST TECHNIQUE: Multidetector CT imaging of the chest was performed  during intravenous contrast administration. CONTRAST:  5mL OMNIPAQUE IOHEXOL 300 MG/ML  SOLN COMPARISON:  04/05/2020 FINDINGS: Cardiovascular:  No acute findings. Mediastinum/Nodes: No masses or pathologically enlarged lymph nodes identified. Lungs/Pleura: Stable postop changes from previous right upper lobectomy. Stable right lower lung pleural-parenchymal scarring. Stable moderate centrilobular emphysema. Sub-solid nodules in the posterior left upper lobe are stable. No new or enlarging pulmonary nodules or masses identified. Upper Abdomen: Normal adrenal glands. Stable small cyst in left hepatic lobe. Musculoskeletal:  No suspicious bone lesions. IMPRESSION: Stable postop changes in right hemithorax. Stable sub-solid left upper lobe nodules. No acute findings.  No new or progressive disease within the thorax. Emphysema (ICD10-J43.9). Electronically Signed   By: Marlaine Hind M.D.   On: 06/15/2020 13:33     ASSESSMENT/PLAN:  This is a very pleasant 70 year old Caucasian male with metastatic lung cancer initially diagnosed as stage IIb (T1b, N1, M0) non-small  cell lung cancer, adenocarcinoma.  He is status post a right upper lobe lobectomy with lymph node dissection under the care of Dr. Roxan Hockey.  This was performed on June 17, 2019.  He has no actionable mutations.  His PD-L1 expression is 90%. He had evidence for metastatic disease in November 2021 with small right pleural effusion as well as multiple right pleural implants.    The patient completed 3 of the 4 planned adjuvant chemotherapy cycles with cisplatin 75 mg per metered square and Alimta 500 mg/m. This was discontinued after cycle #3 due to intolerance.  When the patient was found to have evidence for disease recurrence, the patient was started on immunotherapy with Libtayo (Cempilimab) 350 mg IV every 3 weeks status post 7 cycles. He is tolerating this well.  The patient recently had a restaging CT scan of the chest performed.  Dr. Julien Nordmann  personally independently reviewed the scan and discussed results with the patient today.  The scan did not show any concerning evidence of disease progression.   Regarding his diarrhea, Dr. Julien Nordmann discussed that we will need to monitor his diarrhea closely.  Dr. Julien Nordmann recommends the patient try Imodium.  If the patient continues to have persistent diarrhea and worsening symptoms such as greater than 6 loose stools per day, Dr. Julien Nordmann discussed that we may have to hold his treatment and putting the patient on high-dose steroids.  The patient will monitor his symptoms for now.  We will see him back for follow-up visit in 3 weeks for evaluation before starting cycle 9.  Regarding the itching and the few scattered skin lesions, I encouraged the patient to use lotion/cream.  For itching, the patient may use hydrocortisone cream; however, if the patient has generalized itching, discussed that he may use Benadryl. I cautioned that this may cause drowsiness and I would not recommend for him to take it in combination with other medications that cause drowsiness or prior to driving.  The patient was advised to call immediately if he has any concerning symptoms in the interval. The patient voices understanding of current disease status and treatment options and is in agreement with the current care plan. All questions were answered. The patient knows to call the clinic with any problems, questions or concerns. We can certainly see the patient much sooner if necessary   No orders of the defined types were placed in this encounter.    Tarry Fountain L Myrel Rappleye, PA-C 07/03/20  ADDENDUM: Hematology/Oncology Attending: I had a face-to-face encounter with the patient today.  I reviewed his record, scans and recommended his care plan.  This is a very pleasant 70 years old white male with recurrent/metastatic non-small cell lung cancer, adenocarcinoma with no actionable mutation.  PD-L1 expression of 90%.  He  was initially diagnosed as a stage IIb (T1b, N1, M0) status post right upper lobectomy with lymph node dissection in June 2021 followed by 3 cycles of adjuvant systemic chemotherapy discontinued secondary to intolerance.  The patient had evidence for disease recurrence with right pleural effusion as well as pleural space metastasis in November 2021.  He is currently undergoing treatment with immunotherapy with Libtayo (Cempilimab) status post 7 cycles.  The patient has been tolerating this treatment well with no concerning adverse effects. He had repeat CT scan of the chest performed recently.  I personally and independently reviewed the scans and discussed the results with the patient today. His scan showed no concerning findings for disease progression. I recommended for the patient to continue  his current treatment with Libtayo (Cempilimab) with the same dose. I will see him back for follow-up visit in 3 weeks for evaluation before starting cycle #9. The patient was advised to call immediately if he has any other concerning symptoms in the interval. The total time spent in the appointment was 30 minutes. Disclaimer: This note was dictated with voice recognition software. Similar sounding words can inadvertently be transcribed and may be missed upon review. Eilleen Kempf, MD 07/03/20

## 2020-07-03 ENCOUNTER — Other Ambulatory Visit: Payer: Self-pay

## 2020-07-03 ENCOUNTER — Inpatient Hospital Stay: Payer: Medicare Other

## 2020-07-03 ENCOUNTER — Inpatient Hospital Stay: Payer: Medicare Other | Attending: Internal Medicine

## 2020-07-03 ENCOUNTER — Inpatient Hospital Stay (HOSPITAL_BASED_OUTPATIENT_CLINIC_OR_DEPARTMENT_OTHER): Payer: Medicare Other | Admitting: Physician Assistant

## 2020-07-03 VITALS — BP 118/72 | HR 74 | Temp 97.6°F | Resp 19 | Ht 66.0 in | Wt 147.8 lb

## 2020-07-03 DIAGNOSIS — C3491 Malignant neoplasm of unspecified part of right bronchus or lung: Secondary | ICD-10-CM

## 2020-07-03 DIAGNOSIS — Z5112 Encounter for antineoplastic immunotherapy: Secondary | ICD-10-CM | POA: Diagnosis not present

## 2020-07-03 DIAGNOSIS — Z7951 Long term (current) use of inhaled steroids: Secondary | ICD-10-CM | POA: Insufficient documentation

## 2020-07-03 DIAGNOSIS — J449 Chronic obstructive pulmonary disease, unspecified: Secondary | ICD-10-CM | POA: Insufficient documentation

## 2020-07-03 DIAGNOSIS — Z5111 Encounter for antineoplastic chemotherapy: Secondary | ICD-10-CM

## 2020-07-03 DIAGNOSIS — C3411 Malignant neoplasm of upper lobe, right bronchus or lung: Secondary | ICD-10-CM | POA: Diagnosis not present

## 2020-07-03 DIAGNOSIS — I10 Essential (primary) hypertension: Secondary | ICD-10-CM | POA: Insufficient documentation

## 2020-07-03 DIAGNOSIS — G2581 Restless legs syndrome: Secondary | ICD-10-CM | POA: Insufficient documentation

## 2020-07-03 DIAGNOSIS — Z79899 Other long term (current) drug therapy: Secondary | ICD-10-CM | POA: Insufficient documentation

## 2020-07-03 DIAGNOSIS — Z902 Acquired absence of lung [part of]: Secondary | ICD-10-CM | POA: Insufficient documentation

## 2020-07-03 DIAGNOSIS — R5382 Chronic fatigue, unspecified: Secondary | ICD-10-CM

## 2020-07-03 LAB — CBC WITH DIFFERENTIAL (CANCER CENTER ONLY)
Abs Immature Granulocytes: 0.05 10*3/uL (ref 0.00–0.07)
Basophils Absolute: 0.1 10*3/uL (ref 0.0–0.1)
Basophils Relative: 1 %
Eosinophils Absolute: 0.2 10*3/uL (ref 0.0–0.5)
Eosinophils Relative: 2 %
HCT: 41 % (ref 39.0–52.0)
Hemoglobin: 14.1 g/dL (ref 13.0–17.0)
Immature Granulocytes: 1 %
Lymphocytes Relative: 21 %
Lymphs Abs: 1.8 10*3/uL (ref 0.7–4.0)
MCH: 30.3 pg (ref 26.0–34.0)
MCHC: 34.4 g/dL (ref 30.0–36.0)
MCV: 88.2 fL (ref 80.0–100.0)
Monocytes Absolute: 0.7 10*3/uL (ref 0.1–1.0)
Monocytes Relative: 8 %
Neutro Abs: 5.8 10*3/uL (ref 1.7–7.7)
Neutrophils Relative %: 67 %
Platelet Count: 195 10*3/uL (ref 150–400)
RBC: 4.65 MIL/uL (ref 4.22–5.81)
RDW: 14.3 % (ref 11.5–15.5)
WBC Count: 8.5 10*3/uL (ref 4.0–10.5)
nRBC: 0 % (ref 0.0–0.2)

## 2020-07-03 LAB — CMP (CANCER CENTER ONLY)
ALT: 17 U/L (ref 0–44)
AST: 21 U/L (ref 15–41)
Albumin: 4.3 g/dL (ref 3.5–5.0)
Alkaline Phosphatase: 41 U/L (ref 38–126)
Anion gap: 5 (ref 5–15)
BUN: 20 mg/dL (ref 8–23)
CO2: 30 mmol/L (ref 22–32)
Calcium: 9.3 mg/dL (ref 8.9–10.3)
Chloride: 100 mmol/L (ref 98–111)
Creatinine: 1.07 mg/dL (ref 0.61–1.24)
GFR, Estimated: >60 mL/min (ref >60)
Glucose, Bld: 75 mg/dL (ref 70–99)
Potassium: 4.5 mmol/L (ref 3.5–5.1)
Sodium: 135 mmol/L (ref 135–145)
Total Bilirubin: 0.5 mg/dL (ref 0.3–1.2)
Total Protein: 6.7 g/dL (ref 6.5–8.1)

## 2020-07-03 LAB — TSH: TSH: 1.675 u[IU]/mL (ref 0.350–4.500)

## 2020-07-03 MED ORDER — SODIUM CHLORIDE 0.9 % IV SOLN
350.0000 mg | Freq: Once | INTRAVENOUS | Status: AC
  Administered 2020-07-03: 350 mg via INTRAVENOUS
  Filled 2020-07-03: qty 7

## 2020-07-03 MED ORDER — SODIUM CHLORIDE 0.9% FLUSH
10.0000 mL | INTRAVENOUS | Status: DC | PRN
  Administered 2020-07-03: 10 mL
  Filled 2020-07-03: qty 10

## 2020-07-03 MED ORDER — SODIUM CHLORIDE 0.9 % IV SOLN
Freq: Once | INTRAVENOUS | Status: AC
  Filled 2020-07-03: qty 250

## 2020-07-03 MED ORDER — HEPARIN SOD (PORK) LOCK FLUSH 100 UNIT/ML IV SOLN
500.0000 [IU] | Freq: Once | INTRAVENOUS | Status: AC | PRN
Start: 2020-07-03 — End: 2020-07-03
  Administered 2020-07-03: 500 [IU]
  Filled 2020-07-03: qty 5

## 2020-07-19 DIAGNOSIS — Z20822 Contact with and (suspected) exposure to covid-19: Secondary | ICD-10-CM | POA: Diagnosis not present

## 2020-07-21 NOTE — Progress Notes (Signed)
[facility] OFFICE PROGRESS NOTE  [name], MD [address] [place] [address]  DIAGNOSIS: Metastatic non-small cell lung cancer initially diagnosed as stage IIB (T1b, N1, M0) non-small cell lung cancer, invasive poorly differentiated carcinoma diagnosed in June 2021 with disease recurrence in November 2021 Molecular studies are negative for EGFR mutation as well as ALK gene translocation.  PD-L1 expression was 50-100%.  This is based on the report from the [facility] trial. Molecular studies by [employer]: Showed no actionable mutations and PD-L1 expression was 90%  PRIOR THERAPY:  1) Status post right upper lobectomy with lymph node dissection on June 17, 2019 under the care of Dr. Roxan Hockey. 2) Adjuvant systemic chemotherapy with cisplatin 75 mg/M2 and Alimta 500 mg/M2 every 3 weeks.  First dose September 02, 2019. Status post 3 cycles.  His treatment was discontinued secondary to intolerance.  CURRENT THERAPY: First-line treatment with immunotherapy with Libtayo (Cempilimab) 350 mg IV every 3 weeks status post 8 cycles. First dose on 01/24/20.   INTERVAL HISTORY: Luis [name] 70 y.o. male returns to the clinic for a follow up visit.  The patient is feeling fine today without any concerning complaints. At his last visit, he was endorsing increased loose stool.  He had not tried taking Imodium at that time.  He had had a restaging CT scan at that time which did not show any evidence of inflammation in the stomach or bowel.  Since his last appointment, his diarrhea has persisted. He has not taken imdium because his symptoms "have not been bad". He also took imodium once in the past and it caused significant constipation, which concerns him. He is reporting loose stool about 2-3x per day.  He denies any fever, chills, abdominal pain, or the presence of blood in the stool. He states he has not had as much water as he should have been drinking and he has been  drinking more alcohol, sweet tea, etc.   Otherwise, the patient continues to tolerate treatment with immunotherapy well without any other adverse side effects except for dry skin and itching and a few scattered skin lesions which are manageable.  The patient denies any night sweats or weight loss.  The patient denies any chest pain or hemoptysis.  His right-sided chest discomfort has improved to become more tolerable.  He will take Tylenol if needed.  He reports his baseline dyspnea on exertion due to COPD.  He denies cough.  He denies any nausea, vomiting, or constipation.  He denies any headache or visual changes.  The patient is here today for evaluation before starting cycle #9.   MEDICAL HISTORY: Past Medical History:  Diagnosis Date   Arthritis    through out body   Cancer ([facility])    Chronic bronchitis (HCC)    Concussion    COPD (chronic obstructive pulmonary disease) (HCC)    Dyspnea    Emphysema lung (HCC)    Fatigue    Frozen shoulder    LEFT   GERD (gastroesophageal reflux disease)    Hard of hearing    Headache    alot of days, related to spine issues   Hypertension    Lumbar radiculopathy 2013   Peyronie's disease    Prostatitis 2011   Dr. Gaynelle Arabian   RLS (restless legs syndrome)    Shingles 06/13/2018   shingles on back, finished with prednisone, stills has scabs and pain   Thigh pain    Bilateral Thigh  Thigh pain 10/13   bilateral   Tobacco abuse 2010   still uses electronic cig/ emphysema, SOB easily   Vertigo    per wifes update   Wears partial dentures    upper    ALLERGIES:  is allergic to ativan [lorazepam], doxycycline, and tramadol.  MEDICATIONS:  Current Outpatient Medications  Medication Sig Dispense Refill   acetaminophen (TYLENOL) 500 MG tablet Take 1,000 mg by mouth at bedtime as needed for moderate pain.     Cholecalciferol (VITAMIN D) 125 MCG (5000 UT) CAPS Take 5,000 Units by mouth 3 (three) times a week.     citalopram (CELEXA) 20 MG  tablet Take 1 tablet by mouth daily.     Fluticasone-Salmeterol (ADVAIR) 100-50 MCG/DOSE AEPB Inhale 2 puffs into the lungs 2 (two) times daily.     lidocaine-prilocaine (EMLA) cream Apply 1 application topically as needed. Apply 1 tsp over port site at least 45 minutes prior to lab appointment.Do not rub in cream. Cover with plastic wrap. 30 g 0   Multiple Vitamin (MULTIVITAMIN) tablet Take 1 tablet by mouth daily.     valsartan-hydrochlorothiazide (DIOVAN-HCT) 80-12.5 MG tablet Take 1 tablet by mouth daily.     vitamin B-12 (CYANOCOBALAMIN) 500 MCG tablet 1 tablet     No current facility-administered medications for this visit.    SURGICAL HISTORY:  Past Surgical History:  Procedure Laterality Date   CATARACT EXTRACTION W/PHACO Right 07/08/2018   Procedure: CATARACT EXTRACTION PHACO AND INTRAOCULAR LENS PLACEMENT ([facility]) RIGHT;  Surgeon: Leandrew Koyanagi, MD;  Location: [facility];  Service: Ophthalmology;  Laterality: Right;   CATARACT EXTRACTION W/PHACO Left 07/29/2018   Procedure: CATARACT EXTRACTION PHACO AND INTRAOCULAR LENS PLACEMENT ([facility]) LEFT TORIC LENS;  Surgeon: Leandrew Koyanagi, MD;  Location: [facility];  Service: Ophthalmology;  Laterality: Left;   CHEST TUBE INSERTION Right 07/01/2019   Procedure: CHEST TUBE INSERTION;  Surgeon: [name], MD;  Location: [facility];  Service: Thoracic;  Laterality: Right;   COLONOSCOPY     EYE SURGERY     INTERCOSTAL NERVE BLOCK Right 06/17/2019   Procedure: INTERCOSTAL NERVE BLOCK;  Surgeon: [name], MD;  Location: [facility];  Service: Thoracic;  Laterality: Right;   INTERCOSTAL NERVE BLOCK Right 07/01/2019   Procedure: INTERCOSTAL NERVE BLOCK;  Surgeon: [name], MD;  Location: [facility];  Service: Thoracic;  Laterality: Right;   IR IMAGING GUIDED PORT INSERTION  02/21/2020   TONSILLECTOMY     VIDEO ASSISTED THORACOSCOPY Right 07/01/2019   Procedure: VIDEO ASSISTED THORACOSCOPY - REMOVAL OF RIGHT  MIDDLE LOBE BLEBS;  Surgeon: [name], MD;  Location: [facility];  Service: Thoracic;  Laterality: Right;   VIDEO BRONCHOSCOPY WITH INSERTION OF INTERBRONCHIAL VALVE (IBV) Right 06/25/2019   Procedure: VIDEO BRONCHOSCOPY WITH INSERTION OF INTERBRONCHIAL VALVE (IBV); Size 7 IBV into Right Loer Lobe (RLL) Superior Segment, Size 9 IBV into RLL Basilar Segment;  Surgeon: [name], MD;  Location: [facility];  Service: Thoracic;  Laterality: Right;   VIDEO BRONCHOSCOPY WITH INSERTION OF INTERBRONCHIAL VALVE (IBV) N/A 08/06/2019   Procedure: VIDEO BRONCHOSCOPY WITH REMOVAL OF INTERBRONCHIAL VALVE (IBV);  Surgeon: [name], MD;  Location: [facility];  Service: Thoracic;  Laterality: N/A;    REVIEW OF SYSTEMS:   Review of Systems  Constitutional: Negative for appetite change, chills, fever and unexpected weight change. HENT: Negative for mouth sores, nosebleeds, sore throat and trouble swallowing.   Eyes: Negative for eye problems and icterus. Respiratory: Positive  for baseline shortness of breath with exertion. Negative for cough, hemoptysis and wheezing.   Cardiovascular: Negative for chest pain and leg swelling. Gastrointestinal: Positive for loose stool. Negative for abdominal pain, constipation, nausea and vomiting. Genitourinary: Negative for bladder incontinence, difficulty urinating, dysuria, frequency and hematuria.   Musculoskeletal: Negative for back pain, gait problem, neck pain and neck stiffness. Skin: Positive for itching and a few scattered skin lesions on upper extremities.  Neurological: Negative for dizziness, extremity weakness, gait problem, headaches, light-headedness and seizures. Hematological: Negative for adenopathy. Does not bruise/bleed easily. Psychiatric/Behavioral: Negative for confusion, depression and sleep disturbance. The patient is not nervous/anxious.   PHYSICAL EXAMINATION:  There were no vitals taken for this visit.  ECOG PERFORMANCE  STATUS: 1  Physical Exam  Constitutional: Oriented to person, place, and time and well-developed, well-nourished, and in no distress. HENT: Head: Normocephalic and atraumatic. Mouth/Throat: Oropharynx is clear and moist. No oropharyngeal exudate. Eyes: Conjunctivae are normal. Right eye exhibits no discharge. Left eye exhibits no discharge. No scleral icterus. Neck: Normal range of motion. Neck supple. Cardiovascular: Normal rate, regular rhythm, normal heart sounds and intact distal pulses.   Pulmonary/Chest: Effort normal. Normal breath sounds bilaterally. No respiratory distress. No wheezes. No rales. Abdominal: Soft. Bowel sounds are normal. Exhibits no distension and no mass. There is no tenderness.  Musculoskeletal: Normal range of motion. Exhibits no edema.  Lymphadenopathy:    No cervical adenopathy.  Neurological: Alert and oriented to person, place, and time. Exhibits normal muscle tone. Gait normal. Coordination normal. Skin: Positive for dry skin and a few scattered skin lesions. Skin is warm and dry. No Not diaphoretic. No erythema. No pallor.  Psychiatric: Mood  LABORATORY DATA: Lab Results  Component Value Date   WBC 8.5 07/03/2020   HGB 14.1 07/03/2020   HCT 41.0 07/03/2020   MCV 88.2 07/03/2020   PLT 195 07/03/2020      Chemistry      Component Value Date/Time   NA 135 07/03/2020 1108   K 4.5 07/03/2020 1108   CL 100 07/03/2020 1108   CO2 30 07/03/2020 1108   BUN 20 07/03/2020 1108   CREATININE 1.07 07/03/2020 1108      Component Value Date/Time   CALCIUM 9.3 07/03/2020 1108   ALKPHOS 41 07/03/2020 1108   AST 21 07/03/2020 1108   ALT 17 07/03/2020 1108   BILITOT 0.5 07/03/2020 1108       RADIOGRAPHIC STUDIES:  No results found.   ASSESSMENT/PLAN:  This is a very pleasant 70 year old Caucasian male with metastatic lung cancer initially diagnosed as stage IIb (T1b, N1, M0) non-small cell lung cancer, adenocarcinoma.  He is status post a right  upper lobe lobectomy with lymph node dissection under the care of Dr. Roxan Hockey.  This was performed on June 17, 2019.  He has no actionable mutations.  His PD-L1 expression is 90%. He had evidence for metastatic disease in November 2021 with small right pleural effusion as well as multiple right pleural implants.    The patient completed 3 of the 4 planned adjuvant chemotherapy cycles with cisplatin 75 mg per metered square and Alimta 500 mg/m. This was discontinued after cycle #3 due to intolerance.   When the patient was found to have evidence for disease recurrence, the patient was started on immunotherapy with Libtayo (Cempilimab) 350 mg IV every 3 weeks status post 8 cycles. He is tolerating this well.  Labs were reviewed. His creatinine is slightly elevated, likely from the diarrhea. His potassium is  WNL. No leukocytosis on CBC> Recommend that he proceed with cycle #9 today as scheduled. However, we will arrange for him to receive additional IVF while in the clinic today for his dehydration.   I advised him to try taking imodium for his diarrhea or a 1/2 a tablet if he is concerned about risk of constipation. Discussed that it is important to manage diarrhea so he does not have dehydration or electrolyte abnormalities. We will monitor his diarrhea. We reviewed that should he develop fevers, abdominal pain, blood in the stool, or >6 loose stools daily to please contact us for further evaluation. We will monitor his diarrhea for now.   We will see him back for a follow up visit in 3 weeks for evaluation before starting cycle #10.   The patient was advised to call immediately if he has any concerning symptoms in the interval. The patient voices understanding of current disease status and treatment options and is in agreement with the current care plan. All questions were answered. The patient knows to call the clinic with any problems, questions or concerns. We can certainly see the patient much  sooner if necessary          No orders of the defined types were placed in this encounter.     The total time spent in the appointment was 20-29 minutes in this encounter.   Gardiner Espana L Mailee Klaas, PA-C 07/21/20

## 2020-07-25 ENCOUNTER — Inpatient Hospital Stay: Payer: Medicare Other | Attending: Internal Medicine | Admitting: Physician Assistant

## 2020-07-25 ENCOUNTER — Other Ambulatory Visit: Payer: Self-pay

## 2020-07-25 ENCOUNTER — Inpatient Hospital Stay: Payer: Medicare Other

## 2020-07-25 VITALS — BP 129/79 | HR 81 | Temp 98.6°F | Resp 18 | Ht 66.0 in | Wt 147.8 lb

## 2020-07-25 DIAGNOSIS — C782 Secondary malignant neoplasm of pleura: Secondary | ICD-10-CM | POA: Diagnosis not present

## 2020-07-25 DIAGNOSIS — E860 Dehydration: Secondary | ICD-10-CM

## 2020-07-25 DIAGNOSIS — Z79899 Other long term (current) drug therapy: Secondary | ICD-10-CM | POA: Insufficient documentation

## 2020-07-25 DIAGNOSIS — C3491 Malignant neoplasm of unspecified part of right bronchus or lung: Secondary | ICD-10-CM

## 2020-07-25 DIAGNOSIS — I10 Essential (primary) hypertension: Secondary | ICD-10-CM | POA: Diagnosis not present

## 2020-07-25 DIAGNOSIS — N486 Induration penis plastica: Secondary | ICD-10-CM | POA: Diagnosis not present

## 2020-07-25 DIAGNOSIS — G2581 Restless legs syndrome: Secondary | ICD-10-CM | POA: Insufficient documentation

## 2020-07-25 DIAGNOSIS — J910 Malignant pleural effusion: Secondary | ICD-10-CM | POA: Insufficient documentation

## 2020-07-25 DIAGNOSIS — M1990 Unspecified osteoarthritis, unspecified site: Secondary | ICD-10-CM | POA: Insufficient documentation

## 2020-07-25 DIAGNOSIS — C3411 Malignant neoplasm of upper lobe, right bronchus or lung: Secondary | ICD-10-CM | POA: Insufficient documentation

## 2020-07-25 DIAGNOSIS — K219 Gastro-esophageal reflux disease without esophagitis: Secondary | ICD-10-CM | POA: Diagnosis not present

## 2020-07-25 DIAGNOSIS — Z5112 Encounter for antineoplastic immunotherapy: Secondary | ICD-10-CM

## 2020-07-25 DIAGNOSIS — J449 Chronic obstructive pulmonary disease, unspecified: Secondary | ICD-10-CM | POA: Diagnosis not present

## 2020-07-25 DIAGNOSIS — R197 Diarrhea, unspecified: Secondary | ICD-10-CM | POA: Insufficient documentation

## 2020-07-25 DIAGNOSIS — R5382 Chronic fatigue, unspecified: Secondary | ICD-10-CM

## 2020-07-25 DIAGNOSIS — Z5111 Encounter for antineoplastic chemotherapy: Secondary | ICD-10-CM

## 2020-07-25 LAB — CMP (CANCER CENTER ONLY)
ALT: 13 U/L (ref 0–44)
AST: 14 U/L — ABNORMAL LOW (ref 15–41)
Albumin: 4.1 g/dL (ref 3.5–5.0)
Alkaline Phosphatase: 44 U/L (ref 38–126)
Anion gap: 7 (ref 5–15)
BUN: 19 mg/dL (ref 8–23)
CO2: 30 mmol/L (ref 22–32)
Calcium: 9.4 mg/dL (ref 8.9–10.3)
Chloride: 102 mmol/L (ref 98–111)
Creatinine: 1.35 mg/dL — ABNORMAL HIGH (ref 0.61–1.24)
GFR, Estimated: 57 mL/min — ABNORMAL LOW (ref >60)
Glucose, Bld: 80 mg/dL (ref 70–99)
Potassium: 4.4 mmol/L (ref 3.5–5.1)
Sodium: 139 mmol/L (ref 135–145)
Total Bilirubin: 0.6 mg/dL (ref 0.3–1.2)
Total Protein: 6.8 g/dL (ref 6.5–8.1)

## 2020-07-25 LAB — CBC WITH DIFFERENTIAL (CANCER CENTER ONLY)
Abs Immature Granulocytes: 0.04 10*3/uL (ref 0.00–0.07)
Basophils Absolute: 0.1 10*3/uL (ref 0.0–0.1)
Basophils Relative: 1 %
Eosinophils Absolute: 0.2 10*3/uL (ref 0.0–0.5)
Eosinophils Relative: 2 %
HCT: 40.7 % (ref 39.0–52.0)
Hemoglobin: 14.6 g/dL (ref 13.0–17.0)
Immature Granulocytes: 1 %
Lymphocytes Relative: 20 %
Lymphs Abs: 1.7 10*3/uL (ref 0.7–4.0)
MCH: 31.3 pg (ref 26.0–34.0)
MCHC: 35.9 g/dL (ref 30.0–36.0)
MCV: 87.2 fL (ref 80.0–100.0)
Monocytes Absolute: 0.7 10*3/uL (ref 0.1–1.0)
Monocytes Relative: 9 %
Neutro Abs: 5.8 10*3/uL (ref 1.7–7.7)
Neutrophils Relative %: 67 %
Platelet Count: 206 10*3/uL (ref 150–400)
RBC: 4.67 MIL/uL (ref 4.22–5.81)
RDW: 13.2 % (ref 11.5–15.5)
WBC Count: 8.5 10*3/uL (ref 4.0–10.5)
nRBC: 0 % (ref 0.0–0.2)

## 2020-07-25 LAB — TSH: TSH: 1.225 u[IU]/mL (ref 0.320–4.118)

## 2020-07-25 MED ORDER — SODIUM CHLORIDE 0.9% FLUSH
10.0000 mL | INTRAVENOUS | Status: DC | PRN
  Administered 2020-07-25: 10 mL
  Filled 2020-07-25: qty 10

## 2020-07-25 MED ORDER — SODIUM CHLORIDE 0.9 % IV SOLN
350.0000 mg | Freq: Once | INTRAVENOUS | Status: AC
  Administered 2020-07-25: 350 mg via INTRAVENOUS
  Filled 2020-07-25: qty 7

## 2020-07-25 MED ORDER — HEPARIN SOD (PORK) LOCK FLUSH 100 UNIT/ML IV SOLN
500.0000 [IU] | Freq: Once | INTRAVENOUS | Status: AC | PRN
  Administered 2020-07-25: 500 [IU]
  Filled 2020-07-25: qty 5

## 2020-07-25 MED ORDER — SODIUM CHLORIDE 0.9 % IV SOLN
Freq: Once | INTRAVENOUS | Status: AC
Start: 2020-07-25 — End: 2020-07-25
  Filled 2020-07-25: qty 250

## 2020-07-25 MED ORDER — SODIUM CHLORIDE 0.9 % IV SOLN
Freq: Once | INTRAVENOUS | Status: AC
  Filled 2020-07-25: qty 250

## 2020-08-14 ENCOUNTER — Encounter: Payer: Self-pay | Admitting: Internal Medicine

## 2020-08-15 DIAGNOSIS — U071 COVID-19: Secondary | ICD-10-CM | POA: Diagnosis not present

## 2020-08-15 DIAGNOSIS — M791 Myalgia, unspecified site: Secondary | ICD-10-CM | POA: Diagnosis not present

## 2020-08-15 DIAGNOSIS — Z20822 Contact with and (suspected) exposure to covid-19: Secondary | ICD-10-CM | POA: Diagnosis not present

## 2020-08-16 ENCOUNTER — Inpatient Hospital Stay: Payer: Medicare Other

## 2020-08-16 ENCOUNTER — Inpatient Hospital Stay: Payer: Medicare Other | Admitting: Internal Medicine

## 2020-08-17 DIAGNOSIS — C3410 Malignant neoplasm of upper lobe, unspecified bronchus or lung: Secondary | ICD-10-CM | POA: Diagnosis not present

## 2020-08-17 DIAGNOSIS — J441 Chronic obstructive pulmonary disease with (acute) exacerbation: Secondary | ICD-10-CM | POA: Diagnosis not present

## 2020-08-17 DIAGNOSIS — K219 Gastro-esophageal reflux disease without esophagitis: Secondary | ICD-10-CM | POA: Diagnosis not present

## 2020-08-17 DIAGNOSIS — J449 Chronic obstructive pulmonary disease, unspecified: Secondary | ICD-10-CM | POA: Diagnosis not present

## 2020-08-20 ENCOUNTER — Other Ambulatory Visit: Payer: Self-pay

## 2020-08-20 ENCOUNTER — Emergency Department: Payer: Medicare Other

## 2020-08-20 ENCOUNTER — Emergency Department
Admission: EM | Admit: 2020-08-20 | Discharge: 2020-08-20 | Disposition: A | Discharge status: Home / Self Care | Payer: Medicare Other | Attending: Emergency Medicine | Admitting: Emergency Medicine

## 2020-08-20 ENCOUNTER — Encounter: Payer: Self-pay | Admitting: Emergency Medicine

## 2020-08-20 ENCOUNTER — Encounter: Payer: Self-pay | Admitting: Internal Medicine

## 2020-08-20 DIAGNOSIS — R531 Weakness: Secondary | ICD-10-CM | POA: Diagnosis not present

## 2020-08-20 DIAGNOSIS — Z7951 Long term (current) use of inhaled steroids: Secondary | ICD-10-CM | POA: Insufficient documentation

## 2020-08-20 DIAGNOSIS — J449 Chronic obstructive pulmonary disease, unspecified: Secondary | ICD-10-CM | POA: Diagnosis not present

## 2020-08-20 DIAGNOSIS — U099 Post covid-19 condition, unspecified: Secondary | ICD-10-CM | POA: Insufficient documentation

## 2020-08-20 DIAGNOSIS — R410 Disorientation, unspecified: Secondary | ICD-10-CM | POA: Insufficient documentation

## 2020-08-20 DIAGNOSIS — I10 Essential (primary) hypertension: Secondary | ICD-10-CM | POA: Insufficient documentation

## 2020-08-20 DIAGNOSIS — R0602 Shortness of breath: Secondary | ICD-10-CM | POA: Diagnosis not present

## 2020-08-20 DIAGNOSIS — Z859 Personal history of malignant neoplasm, unspecified: Secondary | ICD-10-CM | POA: Insufficient documentation

## 2020-08-20 DIAGNOSIS — F05 Delirium due to known physiological condition: Secondary | ICD-10-CM | POA: Diagnosis not present

## 2020-08-20 DIAGNOSIS — U071 COVID-19: Secondary | ICD-10-CM | POA: Diagnosis not present

## 2020-08-20 DIAGNOSIS — Z87891 Personal history of nicotine dependence: Secondary | ICD-10-CM | POA: Diagnosis not present

## 2020-08-20 DIAGNOSIS — Z79899 Other long term (current) drug therapy: Secondary | ICD-10-CM | POA: Diagnosis not present

## 2020-08-20 LAB — BASIC METABOLIC PANEL
Anion gap: 8 (ref 5–15)
BUN: 19 mg/dL (ref 8–23)
CO2: 27 mmol/L (ref 22–32)
Calcium: 9.2 mg/dL (ref 8.9–10.3)
Chloride: 101 mmol/L (ref 98–111)
Creatinine, Ser: 1.25 mg/dL — ABNORMAL HIGH (ref 0.61–1.24)
GFR, Estimated: >60 mL/min (ref >60)
Glucose, Bld: 93 mg/dL (ref 70–99)
Potassium: 4.3 mmol/L (ref 3.5–5.1)
Sodium: 136 mmol/L (ref 135–145)

## 2020-08-20 LAB — URINALYSIS, COMPLETE (UACMP) WITH MICROSCOPIC
Bilirubin Urine: NEGATIVE
Glucose, UA: NEGATIVE mg/dL
Hgb urine dipstick: NEGATIVE
Ketones, ur: NEGATIVE mg/dL
Leukocytes,Ua: NEGATIVE
Nitrite: NEGATIVE
Protein, ur: NEGATIVE mg/dL
Specific Gravity, Urine: 1.023 (ref 1.005–1.030)
Squamous Epithelial / HPF: NONE SEEN (ref 0–5)
pH: 5 (ref 5.0–8.0)

## 2020-08-20 LAB — BLOOD GAS, VENOUS
Acid-Base Excess: 1.2 mmol/L (ref 0.0–2.0)
Bicarbonate: 27.2 mmol/L (ref 20.0–28.0)
O2 Saturation: 77 %
Patient temperature: 37
pCO2, Ven: 47 mmHg (ref 44.0–60.0)
pH, Ven: 7.37 (ref 7.250–7.430)
pO2, Ven: 43 mmHg (ref 32.0–45.0)

## 2020-08-20 LAB — CBC
HCT: 43 % (ref 39.0–52.0)
Hemoglobin: 15.6 g/dL (ref 13.0–17.0)
MCH: 32 pg (ref 26.0–34.0)
MCHC: 36.3 g/dL — ABNORMAL HIGH (ref 30.0–36.0)
MCV: 88.3 fL (ref 80.0–100.0)
Platelets: 186 10*3/uL (ref 150–400)
RBC: 4.87 MIL/uL (ref 4.22–5.81)
RDW: 12.4 % (ref 11.5–15.5)
WBC: 5.6 10*3/uL (ref 4.0–10.5)
nRBC: 0 % (ref 0.0–0.2)

## 2020-08-20 LAB — HEPATIC FUNCTION PANEL
ALT: 28 U/L (ref 0–44)
AST: 32 U/L (ref 15–41)
Albumin: 4.3 g/dL (ref 3.5–5.0)
Alkaline Phosphatase: 48 U/L (ref 38–126)
Bilirubin, Direct: 0.1 mg/dL (ref 0.0–0.2)
Indirect Bilirubin: 0.8 mg/dL (ref 0.3–0.9)
Total Bilirubin: 0.9 mg/dL (ref 0.3–1.2)
Total Protein: 7.2 g/dL (ref 6.5–8.1)

## 2020-08-20 LAB — RESP PANEL BY RT-PCR (FLU A&B, COVID) ARPGX2
Influenza A by PCR: NEGATIVE
Influenza B by PCR: NEGATIVE
SARS Coronavirus 2 by RT PCR: POSITIVE — AB

## 2020-08-20 LAB — AMMONIA: Ammonia: <9 umol/L — ABNORMAL LOW (ref 9–35)

## 2020-08-20 LAB — TSH: TSH: 0.917 u[IU]/mL (ref 0.350–4.500)

## 2020-08-20 LAB — LIPASE, BLOOD: Lipase: 41 U/L (ref 11–51)

## 2020-08-20 MED ORDER — IOHEXOL 350 MG/ML SOLN
75.0000 mL | Freq: Once | INTRAVENOUS | Status: AC | PRN
  Administered 2020-08-20: 75 mL via INTRAVENOUS
  Filled 2020-08-20: qty 75

## 2020-08-20 MED ORDER — SODIUM CHLORIDE 0.9 % IV BOLUS
500.0000 mL | Freq: Once | INTRAVENOUS | Status: AC
  Administered 2020-08-20: 500 mL via INTRAVENOUS

## 2020-08-20 NOTE — ED Triage Notes (Signed)
Pt in w/sob, shaking and weakness, tested Covid+ 3 days ago. Has been taking tylenol at home, no relief. Feels worsened sob w/exertion, and like legs give out when he walks. Poor oral intake recently

## 2020-08-20 NOTE — Discharge Instructions (Signed)
Your evaluation today is quite reassuring.  It seems that your symptoms of coronavirus are slowly improving, but I suspect some of your intermittent confusion may be related to this recent infection (though can not be certain).  Continue to monitor closely, if patient develops recurrent or worsening confusion, weakness, difficulty breathing, has a seizure, or other new concerns arise please return to the emergency room right away.  Please follow-up with your primary care.  Call tomorrow to schedule a close follow-up visit.

## 2020-08-20 NOTE — ED Triage Notes (Signed)
Per pt wife, pt has been having hallucinations talking to people who are not there and his legs twitch when he lays down to go to sleep. Pt as scheduled for immunotherapy infusion but was then diagnosed with covid and could not go. Pt wife [name] [phone]

## 2020-08-20 NOTE — ED Notes (Signed)
Pt dx with covid Thursday and has felt weak and SOB. States that wife made him come. Pt ambulatory to room without issue.

## 2020-08-20 NOTE — ED Provider Notes (Signed)
[facility] Emergency Department Provider Note   ____________________________________________   Event Date/Time   First MD Initiated Contact with Patient 08/20/20 1642     (approximate)  I have reviewed the triage vital signs and the nursing notes.   HISTORY  Chief Complaint Shortness of Breath and Weakness    HPI Luis [name] is a 70 y.o. male here for evaluation for having some episodes of confusion since he developed COVID.  Of note the patient is alert well oriented at this time.  I also discussed with his wife.  They both report that he has had a couple episodes now of some confusion.  For instance over the last couple days he will have periods where he will feel a little like he is twitching or picking at things, when he tries to sleep at night he seems a little bit restless, and then at times he will have episodes where he will be sort of hallucinating as though he is talking to himself.  He has not done anything dangerous.  Right now he reports he feels fine but he does recall feeling like he has a little bit of a "dreamlike" feeling when it happens last happened this morning  He is in no pain or discomfort.  Reports he had a slight dry cough.  He was diagnosed with COVID roughly a week ago, was prescribed Paxil but unable to get from the pharmacy until couple days ago at which point he was outside of the 5-day treatment window  Denies shortness of breath just feels a little bit "fatigued and weak" but otherwise doing well.  Discussed with his wife, she reports that he also being treated for lung cancer.  Wife reports that if everything checks out okay and is not further confused to be comfortable continuing his care at home, but given his history and the COVID she went to make sure he is checking out okay does not have dehydration or other concern.  Past Medical History:  Diagnosis Date   Arthritis    through out body   Cancer ([place])    Chronic  bronchitis (HCC)    Concussion    COPD (chronic obstructive pulmonary disease) (HCC)    Dyspnea    Emphysema lung (HCC)    Fatigue    Frozen shoulder    LEFT   GERD (gastroesophageal reflux disease)    Hard of hearing    Headache    alot of days, related to spine issues   Hypertension    Lumbar radiculopathy 2013   Peyronie's disease    Prostatitis 2011   Dr. Gaynelle Arabian   RLS (restless legs syndrome)    Shingles 06/13/2018   shingles on back, finished with prednisone, stills has scabs and pain   Thigh pain    Bilateral Thigh   Thigh pain 10/13   bilateral   Tobacco abuse 2010   still uses electronic cig/ emphysema, SOB easily   Vertigo    per wifes update   Wears partial dentures    upper    Patient Active Problem List   Diagnosis Date Noted   Diarrhea 07/25/2020   Dehydration 07/25/2020   Encounter for antineoplastic immunotherapy 01/17/2020   Encounter for antineoplastic chemotherapy 08/26/2019   Recurrent adenocarcinoma of lung ([place]) 08/03/2019   Goals of care, counseling/discussion 08/03/2019   S/P lobectomy of lung 06/17/2019   COPD GOLD 3 with reversibility 05/06/2019   Solitary pulmonary nodule on lung CT 05/06/2019   Atherosclerosis  of native arteries of the extremities with intermittent claudication 07/13/2012   Pain in limb-Bilateral thigh 07/13/2012   Tingling-Bilateral leg 07/13/2012    Past Surgical History:  Procedure Laterality Date   CATARACT EXTRACTION W/PHACO Right 07/08/2018   Procedure: CATARACT EXTRACTION PHACO AND INTRAOCULAR LENS PLACEMENT ([place]) RIGHT;  Surgeon: Leandrew Koyanagi, MD;  Location: [place];  Service: Ophthalmology;  Laterality: Right;   CATARACT EXTRACTION W/PHACO Left 07/29/2018   Procedure: CATARACT EXTRACTION PHACO AND INTRAOCULAR LENS PLACEMENT ([place]) LEFT TORIC LENS;  Surgeon: Leandrew Koyanagi, MD;  Location: [place];  Service: Ophthalmology;  Laterality: Left;   CHEST TUBE INSERTION Right  07/01/2019   Procedure: CHEST TUBE INSERTION;  Surgeon: [name], MD;  Location: [place];  Service: Thoracic;  Laterality: Right;   COLONOSCOPY     EYE SURGERY     INTERCOSTAL NERVE BLOCK Right 06/17/2019   Procedure: INTERCOSTAL NERVE BLOCK;  Surgeon: [name], MD;  Location: [place];  Service: Thoracic;  Laterality: Right;   INTERCOSTAL NERVE BLOCK Right 07/01/2019   Procedure: INTERCOSTAL NERVE BLOCK;  Surgeon: [name], MD;  Location: [place];  Service: Thoracic;  Laterality: Right;   IR IMAGING GUIDED PORT INSERTION  02/21/2020   TONSILLECTOMY     VIDEO ASSISTED THORACOSCOPY Right 07/01/2019   Procedure: VIDEO ASSISTED THORACOSCOPY - REMOVAL OF RIGHT MIDDLE LOBE BLEBS;  Surgeon: [name], MD;  Location: [place];  Service: Thoracic;  Laterality: Right;   VIDEO BRONCHOSCOPY WITH INSERTION OF INTERBRONCHIAL VALVE (IBV) Right 06/25/2019   Procedure: VIDEO BRONCHOSCOPY WITH INSERTION OF INTERBRONCHIAL VALVE (IBV); Size 7 IBV into Right Loer Lobe (RLL) Superior Segment, Size 9 IBV into RLL Basilar Segment;  Surgeon: [name], MD;  Location: [facility];  Service: Thoracic;  Laterality: Right;   VIDEO BRONCHOSCOPY WITH INSERTION OF INTERBRONCHIAL VALVE (IBV) N/A 08/06/2019   Procedure: VIDEO BRONCHOSCOPY WITH REMOVAL OF INTERBRONCHIAL VALVE (IBV);  Surgeon: [name], MD;  Location: [facility] OR;  Service: Thoracic;  Laterality: N/A;    Prior to Admission medications   Medication Sig Start Date End Date Taking? Authorizing Provider  acetaminophen (TYLENOL) 500 MG tablet Take 1,000 mg by mouth at bedtime as needed for moderate pain.    [provider]  Cholecalciferol (VITAMIN D) 125 MCG (5000 UT) CAPS Take 5,000 Units by mouth 3 (three) times a week.    [provider]  citalopram (CELEXA) 20 MG tablet Take 1 tablet by mouth daily.    [provider]  Fluticasone-Salmeterol (ADVAIR) 100-50 MCG/DOSE AEPB Inhale 2 puffs into  the lungs 2 (two) times daily.    [provider]  lidocaine-prilocaine (EMLA) cream Apply 1 application topically as needed. Apply 1 tsp over port site at least 45 minutes prior to lab appointment.Do not rub in cream. Cover with plastic wrap. 03/20/20   Curt Bears, MD  Multiple Vitamin (MULTIVITAMIN) tablet Take 1 tablet by mouth daily.    [provider]  valsartan-hydrochlorothiazide (DIOVAN-HCT) 80-12.5 MG tablet Take 1 tablet by mouth daily.    [provider]  vitamin B-12 (CYANOCOBALAMIN) 500 MCG tablet 1 tablet 07/05/19   [provider]    Allergies Ativan [lorazepam], Doxycycline, and Tramadol  Family History  Problem Relation Age of Onset   Diabetes Mother    Hyperlipidemia Father    Hypertension Father     Social History Social History   Tobacco Use   Smoking status: Former    Packs/day: 2.00  Years: 40.00    Pack years: 80.00    Types: Cigarettes    Quit date: 01/14/2009    Years since quitting: 11.6   Smokeless tobacco: Former  Scientific laboratory technician Use: Former   Start date: 01/15/1999   Substances: Nicotine, Flavoring  Substance Use Topics   Alcohol use: Yes    Alcohol/week: 12.0 standard drinks    Types: 12 Cans of beer per week   Drug use: No    Review of Systems Constitutional: No fever/chills.  Ports he feels a little bit fatigued and weak compared to normal but still able to walk and get around. Eyes: No visual changes. ENT: No sore throat. Cardiovascular: Denies chest pain. Respiratory: Denies shortness of breath.  Positive for dry cough for the last couple days or roughly 1 week. Gastrointestinal: No abdominal pain.   Genitourinary: Negative for dysuria. Musculoskeletal: Negative for back pain. Skin: Negative for rash. Neurological: Negative for headaches, areas of focal weakness or numbness.  See HPI regarding confusion.    ____________________________________________   PHYSICAL EXAM:  VITAL  SIGNS: ED Triage Vitals  Enc Vitals Group     BP 08/20/20 1308 114/72     Pulse Rate 08/20/20 1308 88     Resp 08/20/20 1308 18     Temp 08/20/20 1308 97.6 F (36.4 C)     Temp src --      SpO2 08/20/20 1308 98 %     Weight 08/20/20 1309 142 lb (64.4 kg)     Height --      Head Circumference --      Peak Flow --      Pain Score --      Pain Loc --      Pain Edu? --      Excl. in [place]? --     Constitutional: Alert and oriented. Well appearing and in no acute distress.  He is resting quite comfortably.  He seems fully lucid at this time alert well oriented able to give a good history that matches that of his wife's. Eyes: Conjunctivae are normal. Head: Atraumatic. Nose: No congestion/rhinnorhea. Mouth/Throat: Mucous membranes are moist. Neck: No stridor.  Cardiovascular: Normal rate, regular rhythm. Grossly normal heart sounds.  Good peripheral circulation. Respiratory: Normal respiratory effort.  No retractions. Lungs CTAB.  Occasional dry cough. Gastrointestinal: Soft and nontender. No distention. Musculoskeletal: No lower extremity tenderness nor edema. Neurologic:  Normal speech and language. No gross focal neurologic deficits are appreciated.  Moves all extremities well.  Normal facial expressions. Skin:  Skin is warm, dry and intact. No rash noted. Psychiatric: Mood and affect are normal. Speech and behavior are normal.  Does not show any evidence of psychomotor agitation at this time.  Does not exhibit any delirious or picking type behavior right now.  ____________________________________________   LABS (all labs ordered are listed, but only abnormal results are displayed)  Labs Reviewed  RESP PANEL BY RT-PCR (FLU A&B, COVID) ARPGX2 - Abnormal; Notable for the following components:      Result Value   SARS Coronavirus 2 by RT PCR POSITIVE (*)    All other components within normal limits  BASIC METABOLIC PANEL - Abnormal; Notable for the following components:    Creatinine, Ser 1.25 (*)    All other components within normal limits  CBC - Abnormal; Notable for the following components:   MCHC 36.3 (*)    All other components within normal limits  URINALYSIS, COMPLETE (UACMP) WITH MICROSCOPIC -  Abnormal; Notable for the following components:   Color, Urine YELLOW (*)    APPearance CLEAR (*)    Bacteria, UA RARE (*)    All other components within normal limits  AMMONIA - Abnormal; Notable for the following components:   Ammonia <9 (*)    All other components within normal limits  URINE CULTURE  BLOOD GAS, VENOUS  TSH  HEPATIC FUNCTION PANEL  LIPASE, BLOOD  CBG MONITORING, ED   ____________________________________________  EKG  Reviewed inter by me at 1315 Heart rate 99 QRS 89 QTc 445 Normal sinus rhythm, possible RVH.  No evidence acute ischemia and appears consistent morphology with previous EKG from June 2021 ____________________________________________  RADIOLOGY  DG Chest 2 View  Result Date: 08/20/2020 CLINICAL DATA:  COVID.  Shortness of breath. EXAM: CHEST - 2 VIEW COMPARISON:  08/04/2019 FINDINGS: Right Port-A-Cath in place with the tip at the cavoatrial junction. There is hyperinflation of the lungs compatible with COPD. Heart is normal size. Areas of scarring in the lungs, right greater than left. No effusions or acute opacities. No acute bony abnormality. IMPRESSION: COPD/chronic changes. No active disease. Electronically Signed   By: Rolm Baptise M.D.   On: 08/20/2020 17:18   CT HEAD W & WO CONTRAST (5MM)  Result Date: 08/20/2020 CLINICAL DATA:  Shortness of breath and delirium.  COVID-19. EXAM: CT HEAD WITHOUT AND WITH CONTRAST TECHNIQUE: Contiguous axial images were obtained from the base of the skull through the vertex without and with intravenous contrast CONTRAST:  80mL OMNIPAQUE IOHEXOL 350 MG/ML SOLN COMPARISON:  None. FINDINGS: Brain: There is no mass, hemorrhage or extra-axial collection. The size and configuration of the  ventricles and extra-axial CSF spaces are normal. The brain parenchyma is normal, without acute or chronic infarction. No abnormal enhancement. Vascular: No abnormal hyperdensity of the major intracranial arteries or dural venous sinuses. No intracranial atherosclerosis. Skull: The visualized skull base, calvarium and extracranial soft tissues are normal. Sinuses/Orbits: No fluid levels or advanced mucosal thickening of the visualized paranasal sinuses. No mastoid or middle ear effusion. The orbits are normal. IMPRESSION: Normal head CT. Electronically Signed   By: Ulyses Jarred M.D.   On: 08/20/2020 19:47    Chest x-ray reviewed, no noted acute active disease.  CT head with and without contrast reviewed normal. ____________________________________________   PROCEDURES  Procedure(s) performed: None  Procedures  Critical Care performed: No  ____________________________________________   INITIAL IMPRESSION / ASSESSMENT AND PLAN / ED COURSE  Pertinent labs & imaging results that were available during my care of the patient were reviewed by me and considered in my medical decision making (see chart for details).   COVID-positive, wife reports and patient reports he tested +3 days ago has had symptoms for about a week.  Overall normal vital signs normal work of breathing.  He does not appear to have any significant distress or evidence of acute complication from COVID but without being on new medications he is also started to exhibit some occasional confusion.  The behavior described by the patient and his wife seem to represent likely some sort of mild delirium that is been intermittent, currently well oriented and fully lucid.  He does however have a history of lung cancer metastatic, etc.  Will obtain CT head with and without to evaluate for acute neurologic etiology tumor mass etc.  In addition check labs, obtain chest x-ray evaluate for urinary tract infection and other etiologies that could be  contributing.    Clinical Course as of  08/20/20 2033  Sun Aug 20, 2020  1922 Patient resting comfortably at this time.  COVID test positive as anticipated.  Wife at the bedside.  Patient fully alert lucid no ongoing or recurrent confusion.  Discussed plan of care with both patient and wife, both comfortable with plan to discharge to home if CT scan of the head does not reveal any other concerning findings.  Continue his treatment for COVID and follow-up with his physician.  I think this is a reasonable plan.  His work-up to this point very reassuring.  No urinary symptoms, will send urine for culture. [MQ]    Clinical Course User Index [MQ] Delman Kitten, MD    Discussed results with patient and his wife, both resting comfortably.  Patient fully lucid without any further evidence of confusion.  Appears appropriate for discharge, careful return precautions discussed both patient and his wife are understanding agreeable. ____________________________________________   FINAL CLINICAL IMPRESSION(S) / ED DIAGNOSES  Final diagnoses:  Generalized weakness  Post-COVID-19 condition  Confusion and disorientation        Note:  This document was prepared using Dragon voice recognition software and may include unintentional dictation errors       Delman Kitten, MD 08/20/20 2034

## 2020-08-22 LAB — URINE CULTURE: Culture: NO GROWTH

## 2020-08-31 NOTE — Progress Notes (Signed)
[facility] OFFICE PROGRESS NOTE  [name], MD [place] [address] [address]  DIAGNOSIS: Metastatic non-small cell lung cancer initially diagnosed as stage IIB (T1b, N1, M0) non-small cell lung cancer, invasive poorly differentiated carcinoma diagnosed in June 2021 with disease recurrence in November 2021 Molecular studies are negative for EGFR mutation as well as ALK gene translocation.  PD-L1 expression was 50-100%.  This is based on the report from the [facility] trial. Molecular studies by [facility]: Showed no actionable mutations and PD-L1 expression was 90%  PRIOR THERAPY:  1) Status post right upper lobectomy with lymph node dissection on June 17, 2019 under the care of Dr. Roxan Hockey. 2) Adjuvant systemic chemotherapy with cisplatin 75 mg/M2 and Alimta 500 mg/M2 every 3 weeks.  First dose September 02, 2019. Status post 3 cycles.  His treatment was discontinued secondary to intolerance.  CURRENT THERAPY: First-line treatment with immunotherapy with Libtayo (Cempilimab) 350 mg IV every 3 weeks status post 9 cycles. First dose on 01/24/20.   INTERVAL HISTORY: Luis [name] 70 y.o. male returns to the clinic today for a follow up visit. In the interval since his last appointment, he was diagnosed with COVID-19. He was prescribed one of the COVID-19 medications but by the time he was able to receive this medication, he was out of the 5 day window. He did present to the ER on 08/20/20 for the chief complaint of weakness, hallucinations, and shortness of breath. He had a CXR which did not show active disease or acute changes besides the chronic COPD related changes. He had a head CT given his intermittent confusion and lung cancer which was normal. His Covid symptoms have resolved at this time except for a lingering dry cough. His cough is improving gradually. He has not taken anything for cough at this time.     Overall, today, he is feeling well. Otherwise,  the patient continues to tolerate treatment with immunotherapy well without any other adverse side effects. The patient denies any night sweats or weight loss. He denies fever or chills since recovering from COVID-19. The patient denies any chest pain or hemoptysis. He reports his baseline dyspnea on exertion due to COPD which may be a little worse since he has been coughing more from having COVID recently.  He denies any nausea, vomiting, or constipation. He denies unusual diarrhea.  He denies any headache or visual changes.  The patient is here today for evaluation before starting cycle #10.     MEDICAL HISTORY: Past Medical History:  Diagnosis Date   Arthritis    through out body   Cancer ([facility])    Chronic bronchitis (HCC)    Concussion    COPD (chronic obstructive pulmonary disease) (HCC)    Dyspnea    Emphysema lung (HCC)    Fatigue    Frozen shoulder    LEFT   GERD (gastroesophageal reflux disease)    Hard of hearing    Headache    alot of days, related to spine issues   Hypertension    Lumbar radiculopathy 2013   Peyronie's disease    Prostatitis 2011   Dr. Gaynelle Arabian   RLS (restless legs syndrome)    Shingles 06/13/2018   shingles on back, finished with prednisone, stills has scabs and pain   Thigh pain    Bilateral Thigh   Thigh pain 10/13   bilateral   Tobacco abuse 2010   still uses electronic cig/ emphysema, SOB easily  Vertigo    per wifes update   Wears partial dentures    upper    ALLERGIES:  is allergic to ativan [lorazepam], doxycycline, and tramadol.  MEDICATIONS:  Current Outpatient Medications  Medication Sig Dispense Refill   acetaminophen (TYLENOL) 500 MG tablet Take 1,000 mg by mouth at bedtime as needed for moderate pain.     Cholecalciferol (VITAMIN D) 125 MCG (5000 UT) CAPS Take 5,000 Units by mouth 3 (three) times a week.     citalopram (CELEXA) 20 MG tablet Take 1 tablet by mouth daily.     Fluticasone-Salmeterol (ADVAIR) 100-50 MCG/DOSE  AEPB Inhale 2 puffs into the lungs 2 (two) times daily.     lidocaine-prilocaine (EMLA) cream Apply 1 application topically as needed. Apply 1 tsp over port site at least 45 minutes prior to lab appointment.Do not rub in cream. Cover with plastic wrap. 30 g 0   Multiple Vitamin (MULTIVITAMIN) tablet Take 1 tablet by mouth daily.     valsartan-hydrochlorothiazide (DIOVAN-HCT) 80-12.5 MG tablet Take 1 tablet by mouth daily.     vitamin B-12 (CYANOCOBALAMIN) 500 MCG tablet 1 tablet     No current facility-administered medications for this visit.    SURGICAL HISTORY:  Past Surgical History:  Procedure Laterality Date   CATARACT EXTRACTION W/PHACO Right 07/08/2018   Procedure: CATARACT EXTRACTION PHACO AND INTRAOCULAR LENS PLACEMENT ([facility]) RIGHT;  Surgeon: Leandrew Koyanagi, MD;  Location: [facility];  Service: Ophthalmology;  Laterality: Right;   CATARACT EXTRACTION W/PHACO Left 07/29/2018   Procedure: CATARACT EXTRACTION PHACO AND INTRAOCULAR LENS PLACEMENT ([facility]) LEFT TORIC LENS;  Surgeon: Leandrew Koyanagi, MD;  Location: [facility];  Service: Ophthalmology;  Laterality: Left;   CHEST TUBE INSERTION Right 07/01/2019   Procedure: CHEST TUBE INSERTION;  Surgeon: [name], MD;  Location: [facility];  Service: Thoracic;  Laterality: Right;   COLONOSCOPY     EYE SURGERY     INTERCOSTAL NERVE BLOCK Right 06/17/2019   Procedure: INTERCOSTAL NERVE BLOCK;  Surgeon: [name], MD;  Location: [facility];  Service: Thoracic;  Laterality: Right;   INTERCOSTAL NERVE BLOCK Right 07/01/2019   Procedure: INTERCOSTAL NERVE BLOCK;  Surgeon: [name], MD;  Location: [facility];  Service: Thoracic;  Laterality: Right;   IR IMAGING GUIDED PORT INSERTION  02/21/2020   TONSILLECTOMY     VIDEO ASSISTED THORACOSCOPY Right 07/01/2019   Procedure: VIDEO ASSISTED THORACOSCOPY - REMOVAL OF RIGHT MIDDLE LOBE BLEBS;  Surgeon: [name], MD;  Location: [facility];  Service:  Thoracic;  Laterality: Right;   VIDEO BRONCHOSCOPY WITH INSERTION OF INTERBRONCHIAL VALVE (IBV) Right 06/25/2019   Procedure: VIDEO BRONCHOSCOPY WITH INSERTION OF INTERBRONCHIAL VALVE (IBV); Size 7 IBV into Right Loer Lobe (RLL) Superior Segment, Size 9 IBV into RLL Basilar Segment;  Surgeon: [name], MD;  Location: [facility];  Service: Thoracic;  Laterality: Right;   VIDEO BRONCHOSCOPY WITH INSERTION OF INTERBRONCHIAL VALVE (IBV) N/A 08/06/2019   Procedure: VIDEO BRONCHOSCOPY WITH REMOVAL OF INTERBRONCHIAL VALVE (IBV);  Surgeon: [name], MD;  Location: [facility];  Service: Thoracic;  Laterality: N/A;    REVIEW OF SYSTEMS:   Constitutional: Negative for appetite change, chills, fever and unexpected weight change. HENT: Negative for mouth sores, nosebleeds, sore throat and trouble swallowing.   Eyes: Negative for eye problems and icterus. Respiratory: Positive for dry cough. Positive for baseline shortness of breath with exertion. Negative for cough, hemoptysis and wheezing.   Cardiovascular: Negative for chest pain and leg  swelling. Gastrointestinal: Negative for abdominal pain, diarrhea, constipation, nausea and vomiting. Genitourinary: Negative for bladder incontinence, difficulty urinating, dysuria, frequency and hematuria.   Musculoskeletal: Negative for back pain, gait problem, neck pain and neck stiffness. Skin: Negative for rashes. Has some bruising on extremities from his dog.  Neurological: Negative for dizziness, extremity weakness, gait problem, headaches, light-headedness and seizures. Hematological: Negative for adenopathy. Does not bruise/bleed easily. Psychiatric/Behavioral: Negative for confusion, depression and sleep disturbance. The patient is not nervous/anxious.    PHYSICAL EXAMINATION:  There were no vitals taken for this visit.  ECOG PERFORMANCE STATUS: 1  Physical Exam  Constitutional: Oriented to person, place, and time and well-developed,  well-nourished, and in no distress. No distress.  HENT:  Head: Normocephalic and atraumatic.  Mouth/Throat: Oropharynx is clear and moist. No oropharyngeal exudate.  Eyes: Conjunctivae are normal. Right eye exhibits no discharge. Left eye exhibits no discharge. No scleral icterus.  Neck: Normal range of motion. Neck supple.  Cardiovascular: Normal rate, regular rhythm, normal heart sounds and intact distal pulses.   Pulmonary/Chest: Effort normal and breath sounds normal/quiet in all lung fields. No respiratory distress. No wheezes. No rales.  Abdominal: Soft. Bowel sounds are normal. Exhibits no distension and no mass. There is no tenderness.  Musculoskeletal: Normal range of motion. Exhibits no edema.  Lymphadenopathy:    No cervical adenopathy.  Neurological: Alert and oriented to person, place, and time. Exhibits normal muscle tone. Gait normal. Coordination normal.  Skin: Has some extremity bruising. Skin is warm and dry. No rash noted. Not diaphoretic. No erythema. No pallor.  Psychiatric: Mood, memory and judgment normal.  Vitals reviewed.  LABORATORY DATA: Lab Results  Component Value Date   WBC 5.6 08/20/2020   HGB 15.6 08/20/2020   HCT 43.0 08/20/2020   MCV 88.3 08/20/2020   PLT 186 08/20/2020      Chemistry      Component Value Date/Time   NA 136 08/20/2020 1319   K 4.3 08/20/2020 1319   CL 101 08/20/2020 1319   CO2 27 08/20/2020 1319   BUN 19 08/20/2020 1319   CREATININE 1.25 (H) 08/20/2020 1319   CREATININE 1.35 (H) 07/25/2020 1053      Component Value Date/Time   CALCIUM 9.2 08/20/2020 1319   ALKPHOS 48 08/20/2020 1319   AST 32 08/20/2020 1319   AST 14 (L) 07/25/2020 1053   ALT 28 08/20/2020 1319   ALT 13 07/25/2020 1053   BILITOT 0.9 08/20/2020 1319   BILITOT 0.6 07/25/2020 1053       RADIOGRAPHIC STUDIES:  DG Chest 2 View  Result Date: 08/20/2020 CLINICAL DATA:  COVID.  Shortness of breath. EXAM: CHEST - 2 VIEW COMPARISON:  08/04/2019 FINDINGS:  Right Port-A-Cath in place with the tip at the cavoatrial junction. There is hyperinflation of the lungs compatible with COPD. Heart is normal size. Areas of scarring in the lungs, right greater than left. No effusions or acute opacities. No acute bony abnormality. IMPRESSION: COPD/chronic changes. No active disease. Electronically Signed   By: Rolm Baptise M.D.   On: 08/20/2020 17:18   CT HEAD W & WO CONTRAST (5MM)  Result Date: 08/20/2020 CLINICAL DATA:  Shortness of breath and delirium.  COVID-19. EXAM: CT HEAD WITHOUT AND WITH CONTRAST TECHNIQUE: Contiguous axial images were obtained from the base of the skull through the vertex without and with intravenous contrast CONTRAST:  70m OMNIPAQUE IOHEXOL 350 MG/ML SOLN COMPARISON:  None. FINDINGS: Brain: There is no mass, hemorrhage or extra-axial collection. The size  and configuration of the ventricles and extra-axial CSF spaces are normal. The brain parenchyma is normal, without acute or chronic infarction. No abnormal enhancement. Vascular: No abnormal hyperdensity of the major intracranial arteries or dural venous sinuses. No intracranial atherosclerosis. Skull: The visualized skull base, calvarium and extracranial soft tissues are normal. Sinuses/Orbits: No fluid levels or advanced mucosal thickening of the visualized paranasal sinuses. No mastoid or middle ear effusion. The orbits are normal. IMPRESSION: Normal head CT. Electronically Signed   By: Ulyses Jarred M.D.   On: 08/20/2020 19:47     ASSESSMENT/PLAN:  This is a very pleasant 70 year old Caucasian male with metastatic lung cancer initially diagnosed as stage IIb (T1b, N1, M0) non-small cell lung cancer, adenocarcinoma.  He is status post a right upper lobe lobectomy with lymph node dissection under the care of Dr. Roxan Hockey.  This was performed on June 17, 2019.  He has no actionable mutations.  His PD-L1 expression is 90%. He had evidence for metastatic disease in November 2021 with small right  pleural effusion as well as multiple right pleural implants.    The patient completed 3 of the 4 planned adjuvant chemotherapy cycles with cisplatin 75 mg per metered square and Alimta 500 mg/m. This was discontinued after cycle #3 due to intolerance.   When the patient was found to have evidence for disease recurrence, the patient was started on immunotherapy with Libtayo (Cempilimab) 350 mg IV every 3 weeks status post 9 cycles. He is tolerating this well.   Labs were reviewed. Recommend that he proceed with cycle #10 today as scheduled.   I will arrange for a restaging CT scan of the chest prior to starting his next cycle of treatment.     We will see him back for a follow up visit in 3 weeks for evaluation and to review his scan results before starting cycle #11.  Advised he can try delsym or robitussin for his cough if needed.   The patient was advised to call immediately if he has any concerning symptoms in the interval. The patient voices understanding of current disease status and treatment options and is in agreement with the current care plan. All questions were answered. The patient knows to call the clinic with any problems, questions or concerns. We can certainly see the patient much sooner if necessary    No orders of the defined types were placed in this encounter.     The total time spent in the appointment was 20-29 minutes   Zenora Karpel L Shaneka Efaw, PA-C 08/31/20

## 2020-09-05 ENCOUNTER — Encounter: Payer: Self-pay | Admitting: Physician Assistant

## 2020-09-05 ENCOUNTER — Inpatient Hospital Stay: Payer: Medicare Other

## 2020-09-05 ENCOUNTER — Other Ambulatory Visit: Payer: Self-pay

## 2020-09-05 ENCOUNTER — Inpatient Hospital Stay: Payer: Medicare Other | Attending: Internal Medicine | Admitting: Physician Assistant

## 2020-09-05 ENCOUNTER — Encounter: Payer: Self-pay | Admitting: Internal Medicine

## 2020-09-05 VITALS — BP 105/69 | HR 74 | Temp 98.4°F | Resp 18 | Ht 66.0 in | Wt 148.4 lb

## 2020-09-05 DIAGNOSIS — Z8616 Personal history of COVID-19: Secondary | ICD-10-CM | POA: Diagnosis not present

## 2020-09-05 DIAGNOSIS — Z79899 Other long term (current) drug therapy: Secondary | ICD-10-CM | POA: Insufficient documentation

## 2020-09-05 DIAGNOSIS — C3491 Malignant neoplasm of unspecified part of right bronchus or lung: Secondary | ICD-10-CM

## 2020-09-05 DIAGNOSIS — Z902 Acquired absence of lung [part of]: Secondary | ICD-10-CM | POA: Diagnosis not present

## 2020-09-05 DIAGNOSIS — M1990 Unspecified osteoarthritis, unspecified site: Secondary | ICD-10-CM | POA: Diagnosis not present

## 2020-09-05 DIAGNOSIS — Z5111 Encounter for antineoplastic chemotherapy: Secondary | ICD-10-CM

## 2020-09-05 DIAGNOSIS — C3411 Malignant neoplasm of upper lobe, right bronchus or lung: Secondary | ICD-10-CM | POA: Diagnosis not present

## 2020-09-05 DIAGNOSIS — I10 Essential (primary) hypertension: Secondary | ICD-10-CM | POA: Insufficient documentation

## 2020-09-05 DIAGNOSIS — J449 Chronic obstructive pulmonary disease, unspecified: Secondary | ICD-10-CM | POA: Diagnosis not present

## 2020-09-05 DIAGNOSIS — Z87891 Personal history of nicotine dependence: Secondary | ICD-10-CM | POA: Diagnosis not present

## 2020-09-05 DIAGNOSIS — Z5112 Encounter for antineoplastic immunotherapy: Secondary | ICD-10-CM | POA: Diagnosis not present

## 2020-09-05 DIAGNOSIS — C782 Secondary malignant neoplasm of pleura: Secondary | ICD-10-CM | POA: Insufficient documentation

## 2020-09-05 DIAGNOSIS — K219 Gastro-esophageal reflux disease without esophagitis: Secondary | ICD-10-CM | POA: Diagnosis not present

## 2020-09-05 DIAGNOSIS — R5382 Chronic fatigue, unspecified: Secondary | ICD-10-CM

## 2020-09-05 LAB — CBC WITH DIFFERENTIAL (CANCER CENTER ONLY)
Abs Immature Granulocytes: 0.04 10*3/uL (ref 0.00–0.07)
Basophils Absolute: 0.1 10*3/uL (ref 0.0–0.1)
Basophils Relative: 1 %
Eosinophils Absolute: 0.3 10*3/uL (ref 0.0–0.5)
Eosinophils Relative: 3 %
HCT: 40.9 % (ref 39.0–52.0)
Hemoglobin: 14.5 g/dL (ref 13.0–17.0)
Immature Granulocytes: 1 %
Lymphocytes Relative: 23 %
Lymphs Abs: 1.8 10*3/uL (ref 0.7–4.0)
MCH: 31.6 pg (ref 26.0–34.0)
MCHC: 35.5 g/dL (ref 30.0–36.0)
MCV: 89.1 fL (ref 80.0–100.0)
Monocytes Absolute: 0.7 10*3/uL (ref 0.1–1.0)
Monocytes Relative: 9 %
Neutro Abs: 4.9 10*3/uL (ref 1.7–7.7)
Neutrophils Relative %: 63 %
Platelet Count: 212 10*3/uL (ref 150–400)
RBC: 4.59 MIL/uL (ref 4.22–5.81)
RDW: 13 % (ref 11.5–15.5)
WBC Count: 7.7 10*3/uL (ref 4.0–10.5)
nRBC: 0 % (ref 0.0–0.2)

## 2020-09-05 LAB — CMP (CANCER CENTER ONLY)
ALT: 20 U/L (ref 0–44)
AST: 23 U/L (ref 15–41)
Albumin: 4.1 g/dL (ref 3.5–5.0)
Alkaline Phosphatase: 46 U/L (ref 38–126)
Anion gap: 8 (ref 5–15)
BUN: 17 mg/dL (ref 8–23)
CO2: 28 mmol/L (ref 22–32)
Calcium: 9.2 mg/dL (ref 8.9–10.3)
Chloride: 103 mmol/L (ref 98–111)
Creatinine: 1.17 mg/dL (ref 0.61–1.24)
GFR, Estimated: >60 mL/min (ref >60)
Glucose, Bld: 74 mg/dL (ref 70–99)
Potassium: 4.3 mmol/L (ref 3.5–5.1)
Sodium: 139 mmol/L (ref 135–145)
Total Bilirubin: 0.7 mg/dL (ref 0.3–1.2)
Total Protein: 6.6 g/dL (ref 6.5–8.1)

## 2020-09-05 LAB — TSH: TSH: 1.105 u[IU]/mL (ref 0.320–4.118)

## 2020-09-05 MED ORDER — SODIUM CHLORIDE 0.9% FLUSH
10.0000 mL | INTRAVENOUS | Status: DC | PRN
Start: 2020-09-05 — End: 2020-09-05
  Administered 2020-09-05: 10 mL

## 2020-09-05 MED ORDER — SODIUM CHLORIDE 0.9 % IV SOLN
350.0000 mg | Freq: Once | INTRAVENOUS | Status: AC
  Administered 2020-09-05: 350 mg via INTRAVENOUS
  Filled 2020-09-05: qty 7

## 2020-09-05 MED ORDER — SODIUM CHLORIDE 0.9% FLUSH
10.0000 mL | INTRAVENOUS | Status: DC | PRN
  Administered 2020-09-05: 10 mL

## 2020-09-05 MED ORDER — HEPARIN SOD (PORK) LOCK FLUSH 100 UNIT/ML IV SOLN
500.0000 [IU] | Freq: Once | INTRAVENOUS | Status: AC | PRN
  Administered 2020-09-05: 500 [IU]

## 2020-09-05 MED ORDER — SODIUM CHLORIDE 0.9 % IV SOLN: Freq: Once | INTRAVENOUS | Status: AC

## 2020-09-05 NOTE — Patient Instructions (Signed)
[facility]  Discharge Instructions: Thank you for choosing [facility] to provide your oncology and hematology care.   If you have a lab appointment with the [facility], please go directly to the [facility] and check in at the registration area.   Wear comfortable clothing and clothing appropriate for easy access to any Portacath or PICC line.   We strive to give you quality time with your provider. You may need to reschedule your appointment if you arrive late (15 or more minutes).  Arriving late affects you and other patients whose appointments are after yours.  Also, if you miss three or more appointments without notifying the office, you may be dismissed from the clinic at the provider's discretion.      For prescription refill requests, have your pharmacy contact our office and allow 72 hours for refills to be completed.    Today you received the following chemotherapy and/or immunotherapy agent: Cemiplimab (Libtayo).   To help prevent nausea and vomiting after your treatment, we encourage you to take your nausea medication as directed.  BELOW ARE SYMPTOMS THAT SHOULD BE REPORTED IMMEDIATELY: *FEVER GREATER THAN 100.4 F (38 C) OR HIGHER *CHILLS OR SWEATING *NAUSEA AND VOMITING THAT IS NOT CONTROLLED WITH YOUR NAUSEA MEDICATION *UNUSUAL SHORTNESS OF BREATH *UNUSUAL BRUISING OR BLEEDING *URINARY PROBLEMS (pain or burning when urinating, or frequent urination) *BOWEL PROBLEMS (unusual diarrhea, constipation, pain near the anus) TENDERNESS IN MOUTH AND THROAT WITH OR WITHOUT PRESENCE OF ULCERS (sore throat, sores in mouth, or a toothache) UNUSUAL RASH, SWELLING OR PAIN  UNUSUAL VAGINAL DISCHARGE OR ITCHING   Items with * indicate a potential emergency and should be followed up as soon as possible or go to the Emergency Department if any problems should occur.  Please show the CHEMOTHERAPY ALERT CARD or IMMUNOTHERAPY ALERT CARD at  check-in to the Emergency Department and triage nurse.  Should you have questions after your visit or need to cancel or reschedule your appointment, please contact [facility]  Dept: [phone]  and follow the prompts.  Office hours are 8:00 a.m. to 4:30 p.m. Monday - Friday. Please note that voicemails left after 4:00 p.m. may not be returned until the following business day.  We are closed weekends and major holidays. You have access to a nurse at all times for urgent questions. Please call the main number to the clinic Dept: [phone] and follow the prompts.   For any non-urgent questions, you may also contact your provider using MyChart. We now offer e-Visits for anyone 98 and older to request care online for non-urgent symptoms. For details visit [email or web address].   Also download the MyChart app! Go to the app store, search "MyChart", open the app, select , and log in with your MyChart username and password.  Due to Covid, a mask is required upon entering the hospital/clinic. If you do not have a mask, one will be given to you upon arrival. For doctor visits, patients may have 1 support person aged 54 or older with them. For treatment visits, patients cannot have anyone with them due to current Covid guidelines and our immunocompromised population.

## 2020-09-21 NOTE — Progress Notes (Signed)
[facility] OFFICE PROGRESS NOTE  Luis Huddle, MD [place] [address] [place]  [id number]  DIAGNOSIS: Metastatic non-small cell lung cancer initially diagnosed as stage IIB (T1b, N1, M0) non-small cell lung cancer, invasive poorly differentiated carcinoma diagnosed in June 2021 with disease recurrence in November 2021 Molecular studies are negative for EGFR mutation as well as ALK gene translocation.  PD-L1 expression was 50-100%.  This is based on the report from the [facility] trial. Molecular studies by [facility]: Showed no actionable mutations and PD-L1 expression was 90%  PRIOR THERAPY: 1) Status post right upper lobectomy with lymph node dissection on June 17, 2019 under the care of Dr. Roxan [name]. 2) Adjuvant systemic chemotherapy with cisplatin 75 mg/M2 and Alimta 500 mg/M2 every 3 weeks.  First dose September 02, 2019. Status post 3 cycles.  His treatment was discontinued secondary to intolerance.  CURRENT THERAPY:  First-line treatment with immunotherapy with Libtayo (Cempilimab) 350 mg IV every 3 weeks status post 11 cycles. First dose on 01/24/20.   INTERVAL HISTORY: Luis [name] is a 70 y.o. male who returns to the clinic today for a follow up visit. The patient is feeling well today without any concerning complaints. The patient continues to tolerate treatment with immunotherapy well. He does report 2-3 days of diarrhea following his last treatment, which is not unusual for him. He reports taking his prescribed imodium in the past for diarrhea, denies taking it following most recent immunotherapy treatment as the diarrhea began to resolve on its own. For these days he reported about 3 episodes of diarrhea per day. He denies any blood in his stools. He reports that since recovering from COVID-19 a month ago, he still has a persistent nonproductive cough that has not worsened or improved. He denies hemoptysis. Denies weight loss. He reports night sweats every  few weeks which is unchanged for him. He denies fever or chills since recovering from COVID-19. He reports his baseline dyspnea on exertion due to COPD, as well as baseline chest pain along his lower right chest wall. He denies any nausea, vomiting, or constipation. He denies any headache or visual changes. The patient recently had a restaging CT scan performed. The patient is here today for evaluation and to review his scan results before starting cycle #12.  MEDICAL HISTORY: Past Medical History:  Diagnosis Date   Arthritis    through out body   Chronic bronchitis (HCC)    Concussion    COPD (chronic obstructive pulmonary disease) (HCC)    Dyspnea    Emphysema lung (HCC)    Fatigue    Frozen shoulder    LEFT   GERD (gastroesophageal reflux disease)    Hard of hearing    Headache    alot of days, related to spine issues   Hypertension    Lumbar radiculopathy 2013   nscl ca 04/2019   Peyronie's disease    Prostatitis 2011   Dr. Gaynelle Arabian   RLS (restless legs syndrome)    Shingles 06/13/2018   shingles on back, finished with prednisone, stills has scabs and pain   Thigh pain    Bilateral Thigh   Thigh pain 10/2011   bilateral   Tobacco abuse 2010   still uses electronic cig/ emphysema, SOB easily   Vertigo    per wifes update   Wears partial dentures    upper    ALLERGIES:  is allergic to ativan [lorazepam], doxycycline, and tramadol.  MEDICATIONS:  Current Outpatient Medications  Medication Sig Dispense Refill   acetaminophen (TYLENOL) 500 MG tablet Take 1,000 mg by mouth at bedtime as needed for moderate pain.     Cholecalciferol (VITAMIN D) 125 MCG (5000 UT) CAPS Take 5,000 Units by mouth 3 (three) times a week.     citalopram (CELEXA) 20 MG tablet Take 1 tablet by mouth daily.     Fluticasone-Salmeterol (ADVAIR) 100-50 MCG/DOSE AEPB Inhale 2 puffs into the lungs 2 (two) times daily.     lidocaine-prilocaine (EMLA) cream Apply 1 application topically as needed.  Apply 1 tsp over port site at least 45 minutes prior to lab appointment.Do not rub in cream. Cover with plastic wrap. 30 g 0   Multiple Vitamin (MULTIVITAMIN) tablet Take 1 tablet by mouth daily.     valsartan-hydrochlorothiazide (DIOVAN-HCT) 80-12.5 MG tablet Take 1 tablet by mouth daily.     vitamin B-12 (CYANOCOBALAMIN) 500 MCG tablet 1 tablet     No current facility-administered medications for this visit.    SURGICAL HISTORY:  Past Surgical History:  Procedure Laterality Date   CATARACT EXTRACTION W/PHACO Right 07/08/2018   Procedure: CATARACT EXTRACTION PHACO AND INTRAOCULAR LENS PLACEMENT ([facility]) RIGHT;  Surgeon: Leandrew Koyanagi, MD;  Location: [facility];  Service: Ophthalmology;  Laterality: Right;   CATARACT EXTRACTION W/PHACO Left 07/29/2018   Procedure: CATARACT EXTRACTION PHACO AND INTRAOCULAR LENS PLACEMENT ([facility]) LEFT TORIC LENS;  Surgeon: Leandrew Koyanagi, MD;  Location: [facility];  Service: Ophthalmology;  Laterality: Left;   CHEST TUBE INSERTION Right 07/01/2019   Procedure: CHEST TUBE INSERTION;  Surgeon: [name], MD;  Location: [facility];  Service: Thoracic;  Laterality: Right;   COLONOSCOPY     EYE SURGERY     INTERCOSTAL NERVE BLOCK Right 06/17/2019   Procedure: INTERCOSTAL NERVE BLOCK;  Surgeon: [name], MD;  Location: [facility];  Service: Thoracic;  Laterality: Right;   INTERCOSTAL NERVE BLOCK Right 07/01/2019   Procedure: INTERCOSTAL NERVE BLOCK;  Surgeon: [name], MD;  Location: [facility];  Service: Thoracic;  Laterality: Right;   IR IMAGING GUIDED PORT INSERTION  02/21/2020   TONSILLECTOMY     VIDEO ASSISTED THORACOSCOPY Right 07/01/2019   Procedure: VIDEO ASSISTED THORACOSCOPY - REMOVAL OF RIGHT MIDDLE LOBE BLEBS;  Surgeon: [name], MD;  Location: [facility];  Service: Thoracic;  Laterality: Right;   VIDEO BRONCHOSCOPY WITH INSERTION OF INTERBRONCHIAL VALVE (IBV) Right 06/25/2019   Procedure: VIDEO  BRONCHOSCOPY WITH INSERTION OF INTERBRONCHIAL VALVE (IBV); Size 7 IBV into Right Loer Lobe (RLL) Superior Segment, Size 9 IBV into RLL Basilar Segment;  Surgeon: [name], MD;  Location: [facility];  Service: Thoracic;  Laterality: Right;   VIDEO BRONCHOSCOPY WITH INSERTION OF INTERBRONCHIAL VALVE (IBV) N/A 08/06/2019   Procedure: VIDEO BRONCHOSCOPY WITH REMOVAL OF INTERBRONCHIAL VALVE (IBV);  Surgeon: [name], MD;  Location: [facility] OR;  Service: Thoracic;  Laterality: N/A;    REVIEW OF SYSTEMS:   Review of Systems  Constitutional: Positive for occasional night sweats. Negative for appetite change, chills, fatigue, fever and unexpected weight change.  HENT:   Negative for mouth sores, nosebleeds, sore throat and trouble swallowing.   Eyes: Negative for eye problems and icterus.  Respiratory: Positive for dry cough and SOB with exertion. Negative for hemoptysis and wheezing.   Cardiovascular: Positive for pain along right lower chest wall Negative for chest tightness and leg swelling.  Gastrointestinal: Positive for diarrhea following treatment for ~2 days. Negative for abdominal pain, constipation, nausea and vomiting.  Genitourinary: Negative for bladder incontinence, difficulty urinating, dysuria, frequency and hematuria.   Musculoskeletal: Negative for back pain, gait problem, neck pain and neck stiffness.  Skin: Negative for itching and rash.  Neurological: Negative for dizziness, extremity weakness, gait problem, headaches, light-headedness and seizures.  Hematological: Negative for adenopathy. Does not bruise/bleed easily.  Psychiatric/Behavioral: Negative for confusion, depression and sleep disturbance. The patient is not nervous/anxious.     PHYSICAL EXAMINATION:  Blood pressure (!) 156/80, pulse (!) 56, temperature (!) 96.2 F (35.7 C), temperature source Tympanic, resp. rate 17, weight 150 lb 12.8 oz (68.4 kg), SpO2 97 %.  ECOG PERFORMANCE STATUS: 1  Physical  Exam  Constitutional: Oriented to person, place, and time and well-developed, well-nourished, and in no distress.  HENT:  Head: Normocephalic and atraumatic.  Mouth/Throat: Oropharynx is clear and moist. No oropharyngeal exudate.  Eyes: Conjunctivae are normal. Right eye exhibits no discharge. Left eye exhibits no discharge. No scleral icterus.  Neck: Normal range of motion. Neck supple.  Cardiovascular: Normal rate, regular rhythm, normal heart sounds and intact distal pulses.   Pulmonary/Chest: Effort normal and breath sounds mildly decreased. No respiratory distress. No wheezes. No rales.  Abdominal: Soft. Bowel sounds are normal. Exhibits no distension and no mass. There is no tenderness.  Musculoskeletal: Normal range of motion. Exhibits no edema.  Lymphadenopathy:    No cervical adenopathy.  Neurological: Alert and oriented to person, place, and time. Exhibits normal muscle tone. Gait normal. Coordination normal.  Skin: Skin is warm and dry. No rash noted. Not diaphoretic. No erythema. No pallor.  Psychiatric: Mood, memory and judgment normal.  Vitals reviewed.  LABORATORY DATA: Lab Results  Component Value Date   WBC 9.2 09/26/2020   HGB 14.9 09/26/2020   HCT 41.3 09/26/2020   MCV 88.6 09/26/2020   PLT 225 09/26/2020      Chemistry      Component Value Date/Time   NA 137 09/26/2020 1015   K 4.4 09/26/2020 1015   CL 100 09/26/2020 1015   CO2 27 09/26/2020 1015   BUN 17 09/26/2020 1015   CREATININE 1.14 09/26/2020 1015      Component Value Date/Time   CALCIUM 9.6 09/26/2020 1015   ALKPHOS 51 09/26/2020 1015   AST 16 09/26/2020 1015   ALT 17 09/26/2020 1015   BILITOT 0.8 09/26/2020 1015       RADIOGRAPHIC STUDIES:  CT Chest W Contrast  Result Date: 09/25/2020 CLINICAL DATA:  Metastatic non-small lung cancer, status post chemotherapy, ongoing immunotherapy, recent COVID infection EXAM: CT CHEST WITH CONTRAST TECHNIQUE: Multidetector CT imaging of the chest was  performed during intravenous contrast administration. CONTRAST:  27m OMNIPAQUE IOHEXOL 350 MG/ML SOLN COMPARISON:  06/15/2020, 04/26/2019 FINDINGS: Cardiovascular: Right chest port catheter. Scattered aortic atherosclerosis. Normal heart size. Left and right coronary artery calcifications. No pericardial effusion. Mediastinum/Nodes: No enlarged mediastinal, hilar, or axillary lymph nodes. Thyroid gland, trachea, and esophagus demonstrate no significant findings. Lungs/Pleura: Moderate centrilobular and paraseptal emphysema. Redemonstrated postoperative findings of right upper lobectomy and wedge resection of the medial segment right middle lobe. Unchanged, trace, loculated appearing right pleural effusion with associated pleural thickening about the right lung base. No significant interval change in mixed solid and ground-glass nodule of the posterior left upper lobe abutting the fissure, overall dimensions approximately 2.0 x 1.0 cm with the most central, dominant internal solid component measuring 0.7 cm (series 7, image 34). Unchanged irregular nodule of the posterior left upper lobe located just laterally, measuring 1.1 x 0.7 cm.  Although these are not significantly changed in the mediate interval, these nodules have both slightly enlarged over time when compared to multiple prior examinations dated dating back to 04/26/2019. Upper Abdomen: No acute abnormality. Stable, benign left adrenal adenoma, partially imaged (series 2, image 162). Musculoskeletal: No chest wall mass or suspicious bone lesions identified. IMPRESSION: 1. Redemonstrated postoperative findings of right upper lobectomy and wedge resection of the medial segment right middle lobe. 2. Unchanged trace, loculated appearing right pleural effusion with associated pleural thickening. No discrete pleural nodularity at this time. 3. No significant interval change in mixed solid and ground-glass nodule of the posterior left upper lobe abutting the  fissure, overall dimensions approximately 2.0 x 1.0 cm with the most central, dominant internal solid component measuring 0.7 cm. Unchanged irregular nodule of the posterior left upper lobe located just laterally, measuring 1.1 x 0.7 cm. Although these are not significantly changed in the immediate interval, these nodules have both slightly enlarged over time when compared to multiple prior examinations dated dating back to 04/26/2019. Findings are concerning for indolent adenocarcinoma. 4. Emphysema. 5. Coronary artery disease. Aortic Atherosclerosis (ICD10-I70.0) and Emphysema (ICD10-J43.9). Electronically Signed   By: Eddie Candle M.D.   On: 09/25/2020 15:55     ASSESSMENT/PLAN:  This is a very pleasant 70 year old Caucasian male with metastatic lung cancer initially diagnosed as stage IIb (T1b, N1, M0) non-small cell lung cancer, adenocarcinoma.  He is status post a right upper lobe lobectomy with lymph node dissection under the care of Dr. Roxan [name].  This was performed on June 17, 2019.  He has no actionable mutations.  His PD-L1 expression is 90%. He had evidence for metastatic disease in November 2021 with small right pleural effusion as well as multiple right pleural implants.    The patient completed 3 of the 4 planned adjuvant chemotherapy cycles with cisplatin 75 mg per metered square and Alimta 500 mg/m. This was discontinued after cycle #3 due to intolerance.  When the patient was found to have evidence for disease recurrence, the patient was started on immunotherapy with Libtayo (Cempilimab) 350 mg IV every 3 weeks status post 11 cycles. He is tolerating this well.  The patient recently had a restaging CT scan performed.  Luis [name] personally and independently reviewed the scan and discussed the results with the patient today.  The scan showed no evidence for disease progression. Recommend that he continue with cycle #12 today scheduled.  We will see him back for follow-up visit in 3  weeks for evaluation before starting cycle #13.  The patient was advised to call immediately if he has any concerning symptoms in the interval. The patient voices understanding of current disease status and treatment options and is in agreement with the current care plan. All questions were answered. The patient knows to call the clinic with any problems, questions or concerns. We can certainly see the patient much sooner if necessary  No orders of the defined types were placed in this encounter.   Jayana Kotula L Annette Bertelson, PA-C 09/26/20  ADDENDUM: Hematology/Oncology Attending: I had a face-to-face encounter with the patient today.  I reviewed his records, lab and scan and recommended his care plan.  This is a very pleasant 71 years old white male with metastatic non-small cell lung cancer that was initially diagnosed as stage IIb (T1b, N1, M0) non-small cell lung cancer, adenocarcinoma status post right upper lobectomy with lymph node dissection in June 2021 followed by 4 cycles of adjuvant systemic chemotherapy initially with cisplatin  and Alimta followed by carboplatin and Alimta for a total of 3 cycles discontinued secondary to intolerance. The patient had evidence for disease recurrence in November 2021.  The molecular studies showed no actionable mutations and PD-L1 expression was 90%. Is started treatment with immunotherapy with single agent Libtayo (Cempilimab) 350 Mg IV every 3 weeks status post 11 cycles.  The patient has been tolerating his treatment well with no concerning adverse effects. He had repeat CT scan of the chest performed recently.  I personally and independently reviewed the scans and discussed the results with the patient today. Has a scan showed no concerning findings for disease progression. I recommended for him to continue his current treatment with Libtayo (Cempilimab) and he will proceed with cycle #12 today. I will see the patient back for follow-up visit in 3  weeks for evaluation before the next cycle of his treatment. He was advised to call immediately if he has any other concerning The total time spent in the appointment was 30 minutes. Disclaimer: This note was dictated with voice recognition software. Similar sounding words can inadvertently be transcribed and may be missed upon review. Eilleen Kempf, MD 09/26/20

## 2020-09-25 ENCOUNTER — Other Ambulatory Visit: Payer: Self-pay

## 2020-09-25 ENCOUNTER — Encounter (HOSPITAL_COMMUNITY): Payer: Self-pay

## 2020-09-25 ENCOUNTER — Ambulatory Visit (HOSPITAL_COMMUNITY)
Admission: RE | Admit: 2020-09-25 | Discharge: 2020-09-25 | Disposition: A | Discharge status: Home / Self Care | Payer: Medicare Other | Source: Ambulatory Visit | Attending: Physician Assistant | Admitting: Physician Assistant

## 2020-09-25 DIAGNOSIS — C3491 Malignant neoplasm of unspecified part of right bronchus or lung: Secondary | ICD-10-CM | POA: Diagnosis not present

## 2020-09-25 DIAGNOSIS — R911 Solitary pulmonary nodule: Secondary | ICD-10-CM | POA: Diagnosis not present

## 2020-09-25 DIAGNOSIS — I700 Atherosclerosis of aorta: Secondary | ICD-10-CM | POA: Diagnosis not present

## 2020-09-25 DIAGNOSIS — J439 Emphysema, unspecified: Secondary | ICD-10-CM | POA: Diagnosis not present

## 2020-09-25 DIAGNOSIS — C3490 Malignant neoplasm of unspecified part of unspecified bronchus or lung: Secondary | ICD-10-CM | POA: Diagnosis not present

## 2020-09-25 DIAGNOSIS — J90 Pleural effusion, not elsewhere classified: Secondary | ICD-10-CM | POA: Diagnosis not present

## 2020-09-25 MED ORDER — HEPARIN SOD (PORK) LOCK FLUSH 100 UNIT/ML IV SOLN: 500.0000 [IU] | Freq: Once | INTRAVENOUS | Status: AC

## 2020-09-25 MED ORDER — HEPARIN SOD (PORK) LOCK FLUSH 100 UNIT/ML IV SOLN
INTRAVENOUS | Status: AC
  Administered 2020-09-25: 500 [IU] via INTRAVENOUS
  Filled 2020-09-25: qty 5

## 2020-09-25 MED ORDER — IOHEXOL 350 MG/ML SOLN
60.0000 mL | Freq: Once | INTRAVENOUS | Status: AC | PRN
  Administered 2020-09-25: 60 mL via INTRAVENOUS

## 2020-09-26 ENCOUNTER — Inpatient Hospital Stay: Payer: Medicare Other

## 2020-09-26 ENCOUNTER — Inpatient Hospital Stay: Payer: Medicare Other | Attending: Internal Medicine | Admitting: Physician Assistant

## 2020-09-26 VITALS — BP 156/80 | HR 56 | Temp 96.2°F | Resp 17 | Wt 150.8 lb

## 2020-09-26 DIAGNOSIS — Z79899 Other long term (current) drug therapy: Secondary | ICD-10-CM | POA: Diagnosis not present

## 2020-09-26 DIAGNOSIS — C3491 Malignant neoplasm of unspecified part of right bronchus or lung: Secondary | ICD-10-CM

## 2020-09-26 DIAGNOSIS — J449 Chronic obstructive pulmonary disease, unspecified: Secondary | ICD-10-CM | POA: Diagnosis not present

## 2020-09-26 DIAGNOSIS — R5382 Chronic fatigue, unspecified: Secondary | ICD-10-CM

## 2020-09-26 DIAGNOSIS — Z5112 Encounter for antineoplastic immunotherapy: Secondary | ICD-10-CM | POA: Insufficient documentation

## 2020-09-26 DIAGNOSIS — I10 Essential (primary) hypertension: Secondary | ICD-10-CM | POA: Insufficient documentation

## 2020-09-26 DIAGNOSIS — J910 Malignant pleural effusion: Secondary | ICD-10-CM | POA: Insufficient documentation

## 2020-09-26 DIAGNOSIS — Z5111 Encounter for antineoplastic chemotherapy: Secondary | ICD-10-CM

## 2020-09-26 DIAGNOSIS — C782 Secondary malignant neoplasm of pleura: Secondary | ICD-10-CM | POA: Insufficient documentation

## 2020-09-26 DIAGNOSIS — C3411 Malignant neoplasm of upper lobe, right bronchus or lung: Secondary | ICD-10-CM | POA: Insufficient documentation

## 2020-09-26 DIAGNOSIS — Z902 Acquired absence of lung [part of]: Secondary | ICD-10-CM | POA: Insufficient documentation

## 2020-09-26 LAB — CMP (CANCER CENTER ONLY)
ALT: 17 U/L (ref 0–44)
AST: 16 U/L (ref 15–41)
Albumin: 4.4 g/dL (ref 3.5–5.0)
Alkaline Phosphatase: 51 U/L (ref 38–126)
Anion gap: 10 (ref 5–15)
BUN: 17 mg/dL (ref 8–23)
CO2: 27 mmol/L (ref 22–32)
Calcium: 9.6 mg/dL (ref 8.9–10.3)
Chloride: 100 mmol/L (ref 98–111)
Creatinine: 1.14 mg/dL (ref 0.61–1.24)
GFR, Estimated: >60 mL/min (ref >60)
Glucose, Bld: 82 mg/dL (ref 70–99)
Potassium: 4.4 mmol/L (ref 3.5–5.1)
Sodium: 137 mmol/L (ref 135–145)
Total Bilirubin: 0.8 mg/dL (ref 0.3–1.2)
Total Protein: 6.9 g/dL (ref 6.5–8.1)

## 2020-09-26 LAB — CBC WITH DIFFERENTIAL (CANCER CENTER ONLY)
Abs Immature Granulocytes: 0.04 10*3/uL (ref 0.00–0.07)
Basophils Absolute: 0.1 10*3/uL (ref 0.0–0.1)
Basophils Relative: 1 %
Eosinophils Absolute: 0.4 10*3/uL (ref 0.0–0.5)
Eosinophils Relative: 4 %
HCT: 41.3 % (ref 39.0–52.0)
Hemoglobin: 14.9 g/dL (ref 13.0–17.0)
Immature Granulocytes: 0 %
Lymphocytes Relative: 17 %
Lymphs Abs: 1.6 10*3/uL (ref 0.7–4.0)
MCH: 32 pg (ref 26.0–34.0)
MCHC: 36.1 g/dL — ABNORMAL HIGH (ref 30.0–36.0)
MCV: 88.6 fL (ref 80.0–100.0)
Monocytes Absolute: 0.6 10*3/uL (ref 0.1–1.0)
Monocytes Relative: 7 %
Neutro Abs: 6.5 10*3/uL (ref 1.7–7.7)
Neutrophils Relative %: 71 %
Platelet Count: 225 10*3/uL (ref 150–400)
RBC: 4.66 MIL/uL (ref 4.22–5.81)
RDW: 13.2 % (ref 11.5–15.5)
WBC Count: 9.2 10*3/uL (ref 4.0–10.5)
nRBC: 0 % (ref 0.0–0.2)

## 2020-09-26 LAB — TSH: TSH: 1.539 u[IU]/mL (ref 0.350–4.500)

## 2020-09-26 MED ORDER — SODIUM CHLORIDE 0.9 % IV SOLN: Freq: Once | INTRAVENOUS | Status: AC

## 2020-09-26 MED ORDER — SODIUM CHLORIDE 0.9% FLUSH
10.0000 mL | INTRAVENOUS | Status: DC | PRN
  Administered 2020-09-26: 10 mL

## 2020-09-26 MED ORDER — HEPARIN SOD (PORK) LOCK FLUSH 100 UNIT/ML IV SOLN
500.0000 [IU] | Freq: Once | INTRAVENOUS | Status: AC | PRN
  Administered 2020-09-26: 500 [IU]

## 2020-09-26 MED ORDER — SODIUM CHLORIDE 0.9 % IV SOLN
350.0000 mg | Freq: Once | INTRAVENOUS | Status: AC
  Administered 2020-09-26: 350 mg via INTRAVENOUS
  Filled 2020-09-26: qty 7

## 2020-09-26 NOTE — Patient Instructions (Signed)
[facility]  Discharge Instructions: Thank you for choosing [facility] to provide your oncology and hematology care.   If you have a lab appointment with the [facility], please go directly to the [facility] and check in at the registration area.   Wear comfortable clothing and clothing appropriate for easy access to any Portacath or PICC line.   We strive to give you quality time with your provider. You may need to reschedule your appointment if you arrive late (15 or more minutes).  Arriving late affects you and other patients whose appointments are after yours.  Also, if you miss three or more appointments without notifying the office, you may be dismissed from the clinic at the provider's discretion.      For prescription refill requests, have your pharmacy contact our office and allow 72 hours for refills to be completed.    Today you received the following chemotherapy and/or immunotherapy agents :  Libtayo      To help prevent nausea and vomiting after your treatment, we encourage you to take your nausea medication as directed.  BELOW ARE SYMPTOMS THAT SHOULD BE REPORTED IMMEDIATELY: *FEVER GREATER THAN 100.4 F (38 C) OR HIGHER *CHILLS OR SWEATING *NAUSEA AND VOMITING THAT IS NOT CONTROLLED WITH YOUR NAUSEA MEDICATION *UNUSUAL SHORTNESS OF BREATH *UNUSUAL BRUISING OR BLEEDING *URINARY PROBLEMS (pain or burning when urinating, or frequent urination) *BOWEL PROBLEMS (unusual diarrhea, constipation, pain near the anus) TENDERNESS IN MOUTH AND THROAT WITH OR WITHOUT PRESENCE OF ULCERS (sore throat, sores in mouth, or a toothache) UNUSUAL RASH, SWELLING OR PAIN  UNUSUAL VAGINAL DISCHARGE OR ITCHING   Items with * indicate a potential emergency and should be followed up as soon as possible or go to the Emergency Department if any problems should occur.  Please show the CHEMOTHERAPY ALERT CARD or IMMUNOTHERAPY ALERT CARD at check-in to  the Emergency Department and triage nurse.  Should you have questions after your visit or need to cancel or reschedule your appointment, please contact [facility]  Dept: [phone]  and follow the prompts.  Office hours are 8:00 a.m. to 4:30 p.m. Monday - Friday. Please note that voicemails left after 4:00 p.m. may not be returned until the following business day.  We are closed weekends and major holidays. You have access to a nurse at all times for urgent questions. Please call the main number to the clinic Dept: [phone] and follow the prompts.   For any non-urgent questions, you may also contact your provider using MyChart. We now offer e-Visits for anyone 56 and older to request care online for non-urgent symptoms. For details visit [email or web address].   Also download the MyChart app! Go to the app store, search "MyChart", open the app, select [facility], and log in with your MyChart username and password.  Due to Covid, a mask is required upon entering the hospital/clinic. If you do not have a mask, one will be given to you upon arrival. For doctor visits, patients may have 1 support person aged 53 or older with them. For treatment visits, patients cannot have anyone with them due to current Covid guidelines and our immunocompromised population.

## 2020-10-10 NOTE — Progress Notes (Signed)
[facility] OFFICE PROGRESS NOTE  [name], MD [place] [address] [place] [address]  DIAGNOSIS: Metastatic non-small cell lung cancer initially diagnosed as stage IIB (T1b, N1, M0) non-small cell lung cancer, invasive poorly differentiated carcinoma diagnosed in June 2021 with disease recurrence in November 2021 Molecular studies are negative for EGFR mutation as well as ALK gene translocation.  PD-L1 expression was 50-100%.  This is based on the report from the [facility] trial. Molecular studies by [employer] [employer]: Showed no actionable mutations and PD-L1 expression was 90%  PRIOR THERAPY:  1) Status post right upper lobectomy with lymph node dissection on June 17, 2019 under the care of Dr. Roxan Hockey. 2) Adjuvant systemic chemotherapy with cisplatin 75 mg/M2 and Alimta 500 mg/M2 every 3 weeks.  First dose September 02, 2019. Status post 3 cycles.  His treatment was discontinued secondary to intolerance.  CURRENT THERAPY: First-line treatment with immunotherapy with Libtayo (Cempilimab) 350 mg IV every 3 weeks status post 12 cycles. First dose on 01/24/20.   INTERVAL HISTORY: Luis [name] 70 y.o. male returns to the clinic today for a follow up visit. The patient is feeling well today without any concerning complaints. The patient continues to tolerate treatment with immunotherapy well. He does report 2-3 days of diarrhea following his last treatment, which is not unusual for him. Then his diarrhea will resolve completely. For these days he reported about 3 episodes of diarrhea per day. He denies any blood in his stools or abdominal pain. He denies hemoptysis. Denies weight loss. He reports night sweats "rarely" which is unchanged for him. He denies fever or chills since recovering from COVID-19. He reports his baseline dyspnea on exertion due to COPD, as well as baseline chest pain along his lower right chest wall from his prior surgery. He has a very mild dry cough.  He states it is not bad enough to warrant any cough medication. He denies any nausea, vomiting, or constipation. He denies any headache or visual changes. Denies rashes or skin changes. The patient is here today for evaluation and to review his scan results before starting cycle #13.  MEDICAL HISTORY: Past Medical History:  Diagnosis Date   Arthritis    through out body   Chronic bronchitis (HCC)    Concussion    COPD (chronic obstructive pulmonary disease) (HCC)    Dyspnea    Emphysema lung (HCC)    Fatigue    Frozen shoulder    LEFT   GERD (gastroesophageal reflux disease)    Hard of hearing    Headache    alot of days, related to spine issues   Hypertension    Lumbar radiculopathy 2013   nscl ca 04/2019   Peyronie's disease    Prostatitis 2011   Dr. Gaynelle Arabian   RLS (restless legs syndrome)    Shingles 06/13/2018   shingles on back, finished with prednisone, stills has scabs and pain   Thigh pain    Bilateral Thigh   Thigh pain 10/2011   bilateral   Tobacco abuse 2010   still uses electronic cig/ emphysema, SOB easily   Vertigo    per wifes update   Wears partial dentures    upper    ALLERGIES:  is allergic to ativan [lorazepam], doxycycline, and tramadol.  MEDICATIONS:  Current Outpatient Medications  Medication Sig Dispense Refill   acetaminophen (TYLENOL) 500 MG tablet Take 1,000 mg by mouth at bedtime as needed for moderate pain.  Cholecalciferol (VITAMIN D) 125 MCG (5000 UT) CAPS Take 5,000 Units by mouth 3 (three) times a week.     citalopram (CELEXA) 20 MG tablet Take 1 tablet by mouth daily.     Fluticasone-Salmeterol (ADVAIR) 100-50 MCG/DOSE AEPB Inhale 2 puffs into the lungs 2 (two) times daily.     lidocaine-prilocaine (EMLA) cream Apply 1 application topically as needed. Apply 1 tsp over port site at least 45 minutes prior to lab appointment.Do not rub in cream. Cover with plastic wrap. 30 g 0   Multiple Vitamin (MULTIVITAMIN) tablet Take 1 tablet  by mouth daily.     valsartan-hydrochlorothiazide (DIOVAN-HCT) 80-12.5 MG tablet Take 1 tablet by mouth daily.     vitamin B-12 (CYANOCOBALAMIN) 500 MCG tablet 1 tablet     No current facility-administered medications for this visit.    SURGICAL HISTORY:  Past Surgical History:  Procedure Laterality Date   CATARACT EXTRACTION W/PHACO Right 07/08/2018   Procedure: CATARACT EXTRACTION PHACO AND INTRAOCULAR LENS PLACEMENT ([facility]) RIGHT;  Surgeon: Leandrew Koyanagi, MD;  Location: [facility];  Service: Ophthalmology;  Laterality: Right;   CATARACT EXTRACTION W/PHACO Left 07/29/2018   Procedure: CATARACT EXTRACTION PHACO AND INTRAOCULAR LENS PLACEMENT ([facility]) LEFT TORIC LENS;  Surgeon: Leandrew Koyanagi, MD;  Location: [facility];  Service: Ophthalmology;  Laterality: Left;   CHEST TUBE INSERTION Right 07/01/2019   Procedure: CHEST TUBE INSERTION;  Surgeon: [name], MD;  Location: [facility];  Service: Thoracic;  Laterality: Right;   COLONOSCOPY     EYE SURGERY     INTERCOSTAL NERVE BLOCK Right 06/17/2019   Procedure: INTERCOSTAL NERVE BLOCK;  Surgeon: [name], MD;  Location: [facility];  Service: Thoracic;  Laterality: Right;   INTERCOSTAL NERVE BLOCK Right 07/01/2019   Procedure: INTERCOSTAL NERVE BLOCK;  Surgeon: [name], MD;  Location: [facility];  Service: Thoracic;  Laterality: Right;   IR IMAGING GUIDED PORT INSERTION  02/21/2020   TONSILLECTOMY     VIDEO ASSISTED THORACOSCOPY Right 07/01/2019   Procedure: VIDEO ASSISTED THORACOSCOPY - REMOVAL OF RIGHT MIDDLE LOBE BLEBS;  Surgeon: [name], MD;  Location: [facility];  Service: Thoracic;  Laterality: Right;   VIDEO BRONCHOSCOPY WITH INSERTION OF INTERBRONCHIAL VALVE (IBV) Right 06/25/2019   Procedure: VIDEO BRONCHOSCOPY WITH INSERTION OF INTERBRONCHIAL VALVE (IBV); Size 7 IBV into Right Loer Lobe (RLL) Superior Segment, Size 9 IBV into RLL Basilar Segment;  Surgeon: [name], MD;   Location: [facility];  Service: Thoracic;  Laterality: Right;   VIDEO BRONCHOSCOPY WITH INSERTION OF INTERBRONCHIAL VALVE (IBV) N/A 08/06/2019   Procedure: VIDEO BRONCHOSCOPY WITH REMOVAL OF INTERBRONCHIAL VALVE (IBV);  Surgeon: [name], MD;  Location: [facility];  Service: Thoracic;  Laterality: N/A;    REVIEW OF SYSTEMS:   Review of Systems  Constitutional: Positive for occasional night sweats. Negative for appetite change, chills, fatigue, fever and unexpected weight change.  HENT:   Negative for mouth sores, nosebleeds, sore throat and trouble swallowing.   Eyes: Negative for eye problems and icterus.  Respiratory: Positive for mild dry cough and SOB with exertion. Negative for hemoptysis and wheezing.   Cardiovascular: Positive for pain along right lower chest wall. Negative for chest tightness and leg swelling.  Gastrointestinal: Positive for diarrhea following treatment for ~2 days. Negative for abdominal pain, constipation, nausea and vomiting.  Genitourinary: Negative for bladder incontinence, difficulty urinating, dysuria, frequency and hematuria.   Musculoskeletal: Negative for back pain, gait problem, neck pain and neck stiffness.  Skin: Negative for itching and rash.  Neurological: Negative for dizziness, extremity weakness, gait problem, headaches, light-headedness and seizures.  Hematological: Negative for adenopathy. Does not bruise/bleed easily.  Psychiatric/Behavioral: Negative for confusion, depression and sleep disturbance. The patient is not nervous/anxious.    PHYSICAL EXAMINATION:  Blood pressure (!) 148/85, pulse 73, temperature (!) 97.5 F (36.4 C), temperature source Tympanic, resp. rate 20, height 5' 6"  (1.676 m), weight 152 lb (68.9 kg), SpO2 99 %.  ECOG PERFORMANCE STATUS: 1  Physical Exam  Constitutional: Oriented to person, place, and time and well-developed, well-nourished, and in no distress.  HENT:  Head: Normocephalic and atraumatic.  Mouth/Throat:  Oropharynx is clear and moist. No oropharyngeal exudate.  Eyes: Conjunctivae are normal. Right eye exhibits no discharge. Left eye exhibits no discharge. No scleral icterus.  Neck: Normal range of motion. Neck supple.  Cardiovascular: Normal rate, regular rhythm, normal heart sounds and intact distal pulses.   Pulmonary/Chest: Effort normal. Quiet breath sounds bilaterally. No respiratory distress. No wheezes. No rales.  Abdominal: Soft. Bowel sounds are normal. Exhibits no distension and no mass. There is no tenderness.  Musculoskeletal: Normal range of motion. Exhibits no edema.  Lymphadenopathy:    No cervical adenopathy.  Neurological: Alert and oriented to person, place, and time. Exhibits normal muscle tone. Gait normal. Coordination normal.  Skin: Skin is warm and dry. No rash noted. Not diaphoretic. No erythema. No pallor.  Psychiatric: Mood, memory and judgment normal.  Vitals reviewed.  LABORATORY DATA: Lab Results  Component Value Date   WBC 7.5 10/18/2020   HGB 14.6 10/18/2020   HCT 40.2 10/18/2020   MCV 88.0 10/18/2020   PLT 197 10/18/2020      Chemistry      Component Value Date/Time   NA 139 10/18/2020 1006   K 4.3 10/18/2020 1006   CL 101 10/18/2020 1006   CO2 28 10/18/2020 1006   BUN 18 10/18/2020 1006   CREATININE 1.22 10/18/2020 1006      Component Value Date/Time   CALCIUM 9.5 10/18/2020 1006   ALKPHOS 51 10/18/2020 1006   AST 15 10/18/2020 1006   ALT 13 10/18/2020 1006   BILITOT 0.7 10/18/2020 1006       RADIOGRAPHIC STUDIES:  CT Chest W Contrast  Result Date: 09/25/2020 CLINICAL DATA:  Metastatic non-small lung cancer, status post chemotherapy, ongoing immunotherapy, recent COVID infection EXAM: CT CHEST WITH CONTRAST TECHNIQUE: Multidetector CT imaging of the chest was performed during intravenous contrast administration. CONTRAST:  61m OMNIPAQUE IOHEXOL 350 MG/ML SOLN COMPARISON:  06/15/2020, 04/26/2019 FINDINGS: Cardiovascular: Right chest  port catheter. Scattered aortic atherosclerosis. Normal heart size. Left and right coronary artery calcifications. No pericardial effusion. Mediastinum/Nodes: No enlarged mediastinal, hilar, or axillary lymph nodes. Thyroid gland, trachea, and esophagus demonstrate no significant findings. Lungs/Pleura: Moderate centrilobular and paraseptal emphysema. Redemonstrated postoperative findings of right upper lobectomy and wedge resection of the medial segment right middle lobe. Unchanged, trace, loculated appearing right pleural effusion with associated pleural thickening about the right lung base. No significant interval change in mixed solid and ground-glass nodule of the posterior left upper lobe abutting the fissure, overall dimensions approximately 2.0 x 1.0 cm with the most central, dominant internal solid component measuring 0.7 cm (series 7, image 34). Unchanged irregular nodule of the posterior left upper lobe located just laterally, measuring 1.1 x 0.7 cm. Although these are not significantly changed in the mediate interval, these nodules have both slightly enlarged over time when compared to multiple prior examinations dated dating  back to 04/26/2019. Upper Abdomen: No acute abnormality. Stable, benign left adrenal adenoma, partially imaged (series 2, image 162). Musculoskeletal: No chest wall mass or suspicious bone lesions identified. IMPRESSION: 1. Redemonstrated postoperative findings of right upper lobectomy and wedge resection of the medial segment right middle lobe. 2. Unchanged trace, loculated appearing right pleural effusion with associated pleural thickening. No discrete pleural nodularity at this time. 3. No significant interval change in mixed solid and ground-glass nodule of the posterior left upper lobe abutting the fissure, overall dimensions approximately 2.0 x 1.0 cm with the most central, dominant internal solid component measuring 0.7 cm. Unchanged irregular nodule of the posterior left  upper lobe located just laterally, measuring 1.1 x 0.7 cm. Although these are not significantly changed in the immediate interval, these nodules have both slightly enlarged over time when compared to multiple prior examinations dated dating back to 04/26/2019. Findings are concerning for indolent adenocarcinoma. 4. Emphysema. 5. Coronary artery disease. Aortic Atherosclerosis (ICD10-I70.0) and Emphysema (ICD10-J43.9). Electronically Signed   By: Eddie Candle M.D.   On: 09/25/2020 15:55     ASSESSMENT/PLAN:  This is a very pleasant 70 year old Caucasian male with metastatic lung cancer initially diagnosed as stage IIb (T1b, N1, M0) non-small cell lung cancer, adenocarcinoma.  He is status post a right upper lobe lobectomy with lymph node dissection under the care of Dr. Roxan Hockey.  This was performed on June 17, 2019.  He has no actionable mutations.  His PD-L1 expression is 90%. He had evidence for metastatic disease in November 2021 with small right pleural effusion as well as multiple right pleural implants.    The patient completed 3 of the 4 planned adjuvant chemotherapy cycles with cisplatin 75 mg per metered square and Alimta 500 mg/m. This was discontinued after cycle #3 due to intolerance.   When the patient was found to have evidence for disease recurrence, the patient was started on immunotherapy with Libtayo (Cempilimab) 350 mg IV every 3 weeks status post 12 cycles. He is tolerating this well.   Labs were reviewed.  Recommend that he proceed with cycle #13 today scheduled.  We will see him back for follow-up visit in 3 weeks for evaluation before starting cycle #14.  The patient was advised to call immediately if he has any concerning symptoms in the interval. The patient voices understanding of current disease status and treatment options and is in agreement with the current care plan. All questions were answered. The patient knows to call the clinic with any problems, questions or  concerns. We can certainly see the patient much sooner if necessary      No orders of the defined types were placed in this encounter.    The total time spent in the appointment was 20-29 minutes.  Rosalena Mccorry L Aashrith Eves, PA-C 10/18/20

## 2020-10-18 ENCOUNTER — Other Ambulatory Visit: Payer: Self-pay

## 2020-10-18 ENCOUNTER — Inpatient Hospital Stay: Payer: Medicare Other | Attending: Internal Medicine | Admitting: Physician Assistant

## 2020-10-18 ENCOUNTER — Inpatient Hospital Stay: Payer: Medicare Other

## 2020-10-18 VITALS — BP 148/85 | HR 73 | Temp 97.5°F | Resp 20 | Ht 66.0 in | Wt 152.0 lb

## 2020-10-18 DIAGNOSIS — C3411 Malignant neoplasm of upper lobe, right bronchus or lung: Secondary | ICD-10-CM | POA: Insufficient documentation

## 2020-10-18 DIAGNOSIS — Z8616 Personal history of COVID-19: Secondary | ICD-10-CM | POA: Diagnosis not present

## 2020-10-18 DIAGNOSIS — J449 Chronic obstructive pulmonary disease, unspecified: Secondary | ICD-10-CM | POA: Diagnosis not present

## 2020-10-18 DIAGNOSIS — C3491 Malignant neoplasm of unspecified part of right bronchus or lung: Secondary | ICD-10-CM

## 2020-10-18 DIAGNOSIS — Z902 Acquired absence of lung [part of]: Secondary | ICD-10-CM | POA: Insufficient documentation

## 2020-10-18 DIAGNOSIS — R5382 Chronic fatigue, unspecified: Secondary | ICD-10-CM

## 2020-10-18 DIAGNOSIS — Z5111 Encounter for antineoplastic chemotherapy: Secondary | ICD-10-CM

## 2020-10-18 DIAGNOSIS — C782 Secondary malignant neoplasm of pleura: Secondary | ICD-10-CM | POA: Insufficient documentation

## 2020-10-18 DIAGNOSIS — R197 Diarrhea, unspecified: Secondary | ICD-10-CM | POA: Diagnosis not present

## 2020-10-18 DIAGNOSIS — Z5112 Encounter for antineoplastic immunotherapy: Secondary | ICD-10-CM | POA: Diagnosis not present

## 2020-10-18 LAB — CBC WITH DIFFERENTIAL (CANCER CENTER ONLY)
Abs Immature Granulocytes: 0.04 10*3/uL (ref 0.00–0.07)
Basophils Absolute: 0.1 10*3/uL (ref 0.0–0.1)
Basophils Relative: 1 %
Eosinophils Absolute: 0.3 10*3/uL (ref 0.0–0.5)
Eosinophils Relative: 3 %
HCT: 40.2 % (ref 39.0–52.0)
Hemoglobin: 14.6 g/dL (ref 13.0–17.0)
Immature Granulocytes: 1 %
Lymphocytes Relative: 23 %
Lymphs Abs: 1.7 10*3/uL (ref 0.7–4.0)
MCH: 31.9 pg (ref 26.0–34.0)
MCHC: 36.3 g/dL — ABNORMAL HIGH (ref 30.0–36.0)
MCV: 88 fL (ref 80.0–100.0)
Monocytes Absolute: 0.6 10*3/uL (ref 0.1–1.0)
Monocytes Relative: 7 %
Neutro Abs: 4.9 10*3/uL (ref 1.7–7.7)
Neutrophils Relative %: 65 %
Platelet Count: 197 10*3/uL (ref 150–400)
RBC: 4.57 MIL/uL (ref 4.22–5.81)
RDW: 13.1 % (ref 11.5–15.5)
WBC Count: 7.5 10*3/uL (ref 4.0–10.5)
nRBC: 0 % (ref 0.0–0.2)

## 2020-10-18 LAB — CMP (CANCER CENTER ONLY)
ALT: 13 U/L (ref 0–44)
AST: 15 U/L (ref 15–41)
Albumin: 4.4 g/dL (ref 3.5–5.0)
Alkaline Phosphatase: 51 U/L (ref 38–126)
Anion gap: 10 (ref 5–15)
BUN: 18 mg/dL (ref 8–23)
CO2: 28 mmol/L (ref 22–32)
Calcium: 9.5 mg/dL (ref 8.9–10.3)
Chloride: 101 mmol/L (ref 98–111)
Creatinine: 1.22 mg/dL (ref 0.61–1.24)
GFR, Estimated: >60 mL/min (ref >60)
Glucose, Bld: 78 mg/dL (ref 70–99)
Potassium: 4.3 mmol/L (ref 3.5–5.1)
Sodium: 139 mmol/L (ref 135–145)
Total Bilirubin: 0.7 mg/dL (ref 0.3–1.2)
Total Protein: 6.8 g/dL (ref 6.5–8.1)

## 2020-10-18 LAB — TSH: TSH: 1.257 u[IU]/mL (ref 0.320–4.118)

## 2020-10-18 MED ORDER — HEPARIN SOD (PORK) LOCK FLUSH 100 UNIT/ML IV SOLN
500.0000 [IU] | Freq: Once | INTRAVENOUS | Status: AC | PRN
  Administered 2020-10-18: 500 [IU]

## 2020-10-18 MED ORDER — SODIUM CHLORIDE 0.9% FLUSH
10.0000 mL | INTRAVENOUS | Status: DC | PRN
  Administered 2020-10-18: 10 mL

## 2020-10-18 MED ORDER — SODIUM CHLORIDE 0.9 % IV SOLN
350.0000 mg | Freq: Once | INTRAVENOUS | Status: AC
  Administered 2020-10-18: 350 mg via INTRAVENOUS
  Filled 2020-10-18: qty 7

## 2020-10-18 MED ORDER — SODIUM CHLORIDE 0.9 % IV SOLN: Freq: Once | INTRAVENOUS | Status: AC

## 2020-10-25 ENCOUNTER — Emergency Department
Admission: EM | Admit: 2020-10-25 | Discharge: 2020-10-25 | Disposition: A | Discharge status: Home / Self Care | Payer: Medicare Other | Attending: Emergency Medicine | Admitting: Emergency Medicine

## 2020-10-25 ENCOUNTER — Emergency Department: Payer: Medicare Other

## 2020-10-25 ENCOUNTER — Other Ambulatory Visit: Payer: Self-pay

## 2020-10-25 ENCOUNTER — Encounter: Payer: Self-pay | Admitting: Emergency Medicine

## 2020-10-25 DIAGNOSIS — Z87891 Personal history of nicotine dependence: Secondary | ICD-10-CM | POA: Insufficient documentation

## 2020-10-25 DIAGNOSIS — S20212A Contusion of left front wall of thorax, initial encounter: Secondary | ICD-10-CM | POA: Diagnosis not present

## 2020-10-25 DIAGNOSIS — S99922A Unspecified injury of left foot, initial encounter: Secondary | ICD-10-CM | POA: Diagnosis present

## 2020-10-25 DIAGNOSIS — S92515A Nondisplaced fracture of proximal phalanx of left lesser toe(s), initial encounter for closed fracture: Secondary | ICD-10-CM

## 2020-10-25 DIAGNOSIS — S7002XA Contusion of left hip, initial encounter: Secondary | ICD-10-CM | POA: Diagnosis not present

## 2020-10-25 DIAGNOSIS — J449 Chronic obstructive pulmonary disease, unspecified: Secondary | ICD-10-CM | POA: Diagnosis not present

## 2020-10-25 DIAGNOSIS — Z79899 Other long term (current) drug therapy: Secondary | ICD-10-CM | POA: Diagnosis not present

## 2020-10-25 DIAGNOSIS — R0781 Pleurodynia: Secondary | ICD-10-CM | POA: Diagnosis not present

## 2020-10-25 DIAGNOSIS — I10 Essential (primary) hypertension: Secondary | ICD-10-CM | POA: Insufficient documentation

## 2020-10-25 DIAGNOSIS — M79672 Pain in left foot: Secondary | ICD-10-CM | POA: Diagnosis not present

## 2020-10-25 DIAGNOSIS — S5002XA Contusion of left elbow, initial encounter: Secondary | ICD-10-CM | POA: Diagnosis not present

## 2020-10-25 DIAGNOSIS — Z7952 Long term (current) use of systemic steroids: Secondary | ICD-10-CM | POA: Insufficient documentation
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
## 2020-11-08 ENCOUNTER — Inpatient Hospital Stay: Payer: Medicare Other

## 2020-11-08 ENCOUNTER — Inpatient Hospital Stay (HOSPITAL_BASED_OUTPATIENT_CLINIC_OR_DEPARTMENT_OTHER): Payer: Medicare Other | Admitting: Internal Medicine

## 2020-11-08 ENCOUNTER — Encounter: Payer: Self-pay | Admitting: Internal Medicine

## 2020-11-08 ENCOUNTER — Other Ambulatory Visit: Payer: Self-pay

## 2020-11-08 VITALS — BP 132/79 | HR 82 | Temp 97.6°F | Resp 16 | Ht 66.0 in | Wt 149.7 lb

## 2020-11-08 DIAGNOSIS — R197 Diarrhea, unspecified: Secondary | ICD-10-CM | POA: Diagnosis not present

## 2020-11-08 DIAGNOSIS — Z5112 Encounter for antineoplastic immunotherapy: Secondary | ICD-10-CM

## 2020-11-08 DIAGNOSIS — C3411 Malignant neoplasm of upper lobe, right bronchus or lung: Secondary | ICD-10-CM | POA: Diagnosis not present

## 2020-11-08 DIAGNOSIS — R5382 Chronic fatigue, unspecified: Secondary | ICD-10-CM

## 2020-11-08 DIAGNOSIS — C3491 Malignant neoplasm of unspecified part of right bronchus or lung: Secondary | ICD-10-CM

## 2020-11-08 DIAGNOSIS — J449 Chronic obstructive pulmonary disease, unspecified: Secondary | ICD-10-CM | POA: Diagnosis not present

## 2020-11-08 DIAGNOSIS — Z8616 Personal history of COVID-19: Secondary | ICD-10-CM | POA: Diagnosis not present

## 2020-11-08 DIAGNOSIS — Z5111 Encounter for antineoplastic chemotherapy: Secondary | ICD-10-CM

## 2020-11-08 DIAGNOSIS — C782 Secondary malignant neoplasm of pleura: Secondary | ICD-10-CM | POA: Diagnosis not present

## 2020-11-08 LAB — CBC WITH DIFFERENTIAL (CANCER CENTER ONLY)
Abs Immature Granulocytes: 0.03 10*3/uL (ref 0.00–0.07)
Basophils Absolute: 0.1 10*3/uL (ref 0.0–0.1)
Basophils Relative: 1 %
Eosinophils Absolute: 0.3 10*3/uL (ref 0.0–0.5)
Eosinophils Relative: 3 %
HCT: 43 % (ref 39.0–52.0)
Hemoglobin: 14.8 g/dL (ref 13.0–17.0)
Immature Granulocytes: 0 %
Lymphocytes Relative: 16 %
Lymphs Abs: 1.4 10*3/uL (ref 0.7–4.0)
MCH: 30.6 pg (ref 26.0–34.0)
MCHC: 34.4 g/dL (ref 30.0–36.0)
MCV: 88.8 fL (ref 80.0–100.0)
Monocytes Absolute: 0.6 10*3/uL (ref 0.1–1.0)
Monocytes Relative: 7 %
Neutro Abs: 6 10*3/uL (ref 1.7–7.7)
Neutrophils Relative %: 73 %
Platelet Count: 245 10*3/uL (ref 150–400)
RBC: 4.84 MIL/uL (ref 4.22–5.81)
RDW: 12.8 % (ref 11.5–15.5)
WBC Count: 8.4 10*3/uL (ref 4.0–10.5)
nRBC: 0 % (ref 0.0–0.2)

## 2020-11-08 LAB — CMP (CANCER CENTER ONLY)
ALT: 16 U/L (ref 0–44)
AST: 20 U/L (ref 15–41)
Albumin: 4.4 g/dL (ref 3.5–5.0)
Alkaline Phosphatase: 81 U/L (ref 38–126)
Anion gap: 12 (ref 5–15)
BUN: 19 mg/dL (ref 8–23)
CO2: 25 mmol/L (ref 22–32)
Calcium: 9.7 mg/dL (ref 8.9–10.3)
Chloride: 99 mmol/L (ref 98–111)
Creatinine: 1.31 mg/dL — ABNORMAL HIGH (ref 0.61–1.24)
GFR, Estimated: 59 mL/min — ABNORMAL LOW (ref >60)
Glucose, Bld: 153 mg/dL — ABNORMAL HIGH (ref 70–99)
Potassium: 4.3 mmol/L (ref 3.5–5.1)
Sodium: 136 mmol/L (ref 135–145)
Total Bilirubin: 0.6 mg/dL (ref 0.3–1.2)
Total Protein: 7.2 g/dL (ref 6.5–8.1)

## 2020-11-08 LAB — TSH: TSH: 1.457 u[IU]/mL (ref 0.320–4.118)

## 2020-11-08 MED ORDER — SODIUM CHLORIDE 0.9% FLUSH
10.0000 mL | INTRAVENOUS | Status: DC | PRN
Start: 2020-11-08 — End: 2020-11-08
  Administered 2020-11-08: 10 mL

## 2020-11-08 MED ORDER — SODIUM CHLORIDE 0.9 % IV SOLN: Freq: Once | INTRAVENOUS | Status: AC

## 2020-11-08 MED ORDER — SODIUM CHLORIDE 0.9 % IV SOLN
350.0000 mg | Freq: Once | INTRAVENOUS | Status: AC
  Administered 2020-11-08: 350 mg via INTRAVENOUS
  Filled 2020-11-08: qty 7

## 2020-11-08 MED ORDER — HEPARIN SOD (PORK) LOCK FLUSH 100 UNIT/ML IV SOLN
500.0000 [IU] | Freq: Once | INTRAVENOUS | Status: AC | PRN
Start: 2020-11-08 — End: 2020-11-08
  Administered 2020-11-08: 500 [IU]

## 2020-11-08 NOTE — Patient Instructions (Signed)
[facility] ONCOLOGY  Discharge Instructions: Thank you for choosing [facility] to provide your oncology and hematology care.   If you have a lab appointment with the [facility], please go directly to the [facility] and check in at the registration area.   Wear comfortable clothing and clothing appropriate for easy access to any Portacath or PICC line.   We strive to give you quality time with your provider. You may need to reschedule your appointment if you arrive late (15 or more minutes).  Arriving late affects you and other patients whose appointments are after yours.  Also, if you miss three or more appointments without notifying the office, you may be dismissed from the clinic at the provider's discretion.      For prescription refill requests, have your pharmacy contact our office and allow 72 hours for refills to be completed.    Today you received the following chemotherapy and/or immunotherapy agents [place]      To help prevent nausea and vomiting after your treatment, we encourage you to take your nausea medication as directed.  BELOW ARE SYMPTOMS THAT SHOULD BE REPORTED IMMEDIATELY: *FEVER GREATER THAN 100.4 F (38 C) OR HIGHER *CHILLS OR SWEATING *NAUSEA AND VOMITING THAT IS NOT CONTROLLED WITH YOUR NAUSEA MEDICATION *UNUSUAL SHORTNESS OF BREATH *UNUSUAL BRUISING OR BLEEDING *URINARY PROBLEMS (pain or burning when urinating, or frequent urination) *BOWEL PROBLEMS (unusual diarrhea, constipation, pain near the anus) TENDERNESS IN MOUTH AND THROAT WITH OR WITHOUT PRESENCE OF ULCERS (sore throat, sores in mouth, or a toothache) UNUSUAL RASH, SWELLING OR PAIN  UNUSUAL VAGINAL DISCHARGE OR ITCHING   Items with * indicate a potential emergency and should be followed up as soon as possible or go to the Emergency Department if any problems should occur.  Please show the CHEMOTHERAPY ALERT CARD or IMMUNOTHERAPY ALERT CARD at check-in to the  Emergency Department and triage nurse.  Should you have questions after your visit or need to cancel or reschedule your appointment, please contact [facility]  Dept: [phone]  and follow the prompts.  Office hours are 8:00 a.m. to 4:30 p.m. Monday - Friday. Please note that voicemails left after 4:00 p.m. may not be returned until the following business day.  We are closed weekends and major holidays. You have access to a nurse at all times for urgent questions. Please call the main number to the clinic Dept: [phone] and follow the prompts.   For any non-urgent questions, you may also contact your provider using MyChart. We now offer e-Visits for anyone 24 and older to request care online for non-urgent symptoms. For details visit [email or web address].   Also download the MyChart app! Go to the app store, search "MyChart", open the app, select [facility], and log in with your MyChart username and password.  Due to Covid, a mask is required upon entering the hospital/clinic. If you do not have a mask, one will be given to you upon arrival. For doctor visits, patients may have 1 support person aged 69 or older with them. For treatment visits, patients cannot have anyone with them due to current Covid guidelines and our immunocompromised population.
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
## 2020-11-09 ENCOUNTER — Telehealth: Payer: Self-pay | Admitting: Internal Medicine

## 2020-11-09 NOTE — Telephone Encounter (Signed)
Left message with rescheduled upcoming appointment per 10/26 los.
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
## 2020-11-29 ENCOUNTER — Other Ambulatory Visit: Payer: Self-pay

## 2020-11-29 ENCOUNTER — Inpatient Hospital Stay: Payer: Medicare Other

## 2020-11-29 ENCOUNTER — Inpatient Hospital Stay (HOSPITAL_BASED_OUTPATIENT_CLINIC_OR_DEPARTMENT_OTHER): Payer: Medicare Other | Admitting: Physician Assistant

## 2020-11-29 ENCOUNTER — Inpatient Hospital Stay: Payer: Medicare Other | Attending: Internal Medicine

## 2020-11-29 VITALS — Resp 16

## 2020-11-29 VITALS — BP 119/78 | HR 88 | Temp 98.6°F | Wt 150.8 lb

## 2020-11-29 DIAGNOSIS — R5382 Chronic fatigue, unspecified: Secondary | ICD-10-CM

## 2020-11-29 DIAGNOSIS — K219 Gastro-esophageal reflux disease without esophagitis: Secondary | ICD-10-CM | POA: Insufficient documentation

## 2020-11-29 DIAGNOSIS — C3411 Malignant neoplasm of upper lobe, right bronchus or lung: Secondary | ICD-10-CM | POA: Insufficient documentation

## 2020-11-29 DIAGNOSIS — I10 Essential (primary) hypertension: Secondary | ICD-10-CM | POA: Insufficient documentation

## 2020-11-29 DIAGNOSIS — C3491 Malignant neoplasm of unspecified part of right bronchus or lung: Secondary | ICD-10-CM

## 2020-11-29 DIAGNOSIS — J449 Chronic obstructive pulmonary disease, unspecified: Secondary | ICD-10-CM | POA: Insufficient documentation

## 2020-11-29 DIAGNOSIS — M25551 Pain in right hip: Secondary | ICD-10-CM | POA: Diagnosis not present

## 2020-11-29 DIAGNOSIS — Z5112 Encounter for antineoplastic immunotherapy: Secondary | ICD-10-CM

## 2020-11-29 DIAGNOSIS — Z5111 Encounter for antineoplastic chemotherapy: Secondary | ICD-10-CM

## 2020-11-29 LAB — CBC WITH DIFFERENTIAL (CANCER CENTER ONLY)
Abs Immature Granulocytes: 0.04 10*3/uL (ref 0.00–0.07)
Basophils Absolute: 0.1 10*3/uL (ref 0.0–0.1)
Basophils Relative: 1 %
Eosinophils Absolute: 0.3 10*3/uL (ref 0.0–0.5)
Eosinophils Relative: 4 %
HCT: 43.7 % (ref 39.0–52.0)
Hemoglobin: 15.3 g/dL (ref 13.0–17.0)
Immature Granulocytes: 1 %
Lymphocytes Relative: 19 %
Lymphs Abs: 1.6 10*3/uL (ref 0.7–4.0)
MCH: 31.2 pg (ref 26.0–34.0)
MCHC: 35 g/dL (ref 30.0–36.0)
MCV: 89.2 fL (ref 80.0–100.0)
Monocytes Absolute: 0.8 10*3/uL (ref 0.1–1.0)
Monocytes Relative: 9 %
Neutro Abs: 5.6 10*3/uL (ref 1.7–7.7)
Neutrophils Relative %: 66 %
Platelet Count: 201 10*3/uL (ref 150–400)
RBC: 4.9 MIL/uL (ref 4.22–5.81)
RDW: 12.7 % (ref 11.5–15.5)
WBC Count: 8.4 10*3/uL (ref 4.0–10.5)
nRBC: 0 % (ref 0.0–0.2)

## 2020-11-29 LAB — CMP (CANCER CENTER ONLY)
ALT: 15 U/L (ref 0–44)
AST: 15 U/L (ref 15–41)
Albumin: 4.4 g/dL (ref 3.5–5.0)
Alkaline Phosphatase: 63 U/L (ref 38–126)
Anion gap: 10 (ref 5–15)
BUN: 15 mg/dL (ref 8–23)
CO2: 27 mmol/L (ref 22–32)
Calcium: 9.4 mg/dL (ref 8.9–10.3)
Chloride: 100 mmol/L (ref 98–111)
Creatinine: 1.25 mg/dL — ABNORMAL HIGH (ref 0.61–1.24)
GFR, Estimated: >60 mL/min (ref >60)
Glucose, Bld: 87 mg/dL (ref 70–99)
Potassium: 4.1 mmol/L (ref 3.5–5.1)
Sodium: 137 mmol/L (ref 135–145)
Total Bilirubin: 0.6 mg/dL (ref 0.3–1.2)
Total Protein: 7 g/dL (ref 6.5–8.1)

## 2020-11-29 LAB — TSH: TSH: 1.62 u[IU]/mL (ref 0.320–4.118)

## 2020-11-29 MED ORDER — SODIUM CHLORIDE 0.9 % IV SOLN
350.0000 mg | Freq: Once | INTRAVENOUS | Status: AC
  Administered 2020-11-29: 350 mg via INTRAVENOUS
  Filled 2020-11-29: qty 7

## 2020-11-29 MED ORDER — SODIUM CHLORIDE 0.9% FLUSH
10.0000 mL | INTRAVENOUS | Status: DC | PRN
  Administered 2020-11-29: 10 mL

## 2020-11-29 MED ORDER — HEPARIN SOD (PORK) LOCK FLUSH 100 UNIT/ML IV SOLN
500.0000 [IU] | Freq: Once | INTRAVENOUS | Status: AC | PRN
  Administered 2020-11-29: 500 [IU]

## 2020-11-29 MED ORDER — SODIUM CHLORIDE 0.9 % IV SOLN
Freq: Once | INTRAVENOUS | Status: AC
Start: 2020-11-29 — End: 2020-11-29

## 2020-11-29 NOTE — Patient Instructions (Signed)
[facility] ONCOLOGY  Discharge Instructions: Thank you for choosing [facility] to provide your oncology and hematology care.   If you have a lab appointment with the [facility], please go directly to the [facility] and check in at the registration area.   Wear comfortable clothing and clothing appropriate for easy access to any Portacath or PICC line.   We strive to give you quality time with your provider. You may need to reschedule your appointment if you arrive late (15 or more minutes).  Arriving late affects you and other patients whose appointments are after yours.  Also, if you miss three or more appointments without notifying the office, you may be dismissed from the clinic at the provider's discretion.      For prescription refill requests, have your pharmacy contact our office and allow 72 hours for refills to be completed.    Today you received the following chemotherapy and/or immunotherapy agents Libtayo      To help prevent nausea and vomiting after your treatment, we encourage you to take your nausea medication as directed.  BELOW ARE SYMPTOMS THAT SHOULD BE REPORTED IMMEDIATELY: *FEVER GREATER THAN 100.4 F (38 C) OR HIGHER *CHILLS OR SWEATING *NAUSEA AND VOMITING THAT IS NOT CONTROLLED WITH YOUR NAUSEA MEDICATION *UNUSUAL SHORTNESS OF BREATH *UNUSUAL BRUISING OR BLEEDING *URINARY PROBLEMS (pain or burning when urinating, or frequent urination) *BOWEL PROBLEMS (unusual diarrhea, constipation, pain near the anus) TENDERNESS IN MOUTH AND THROAT WITH OR WITHOUT PRESENCE OF ULCERS (sore throat, sores in mouth, or a toothache) UNUSUAL RASH, SWELLING OR PAIN  UNUSUAL VAGINAL DISCHARGE OR ITCHING   Items with * indicate a potential emergency and should be followed up as soon as possible or go to the Emergency Department if any problems should occur.  Please show the CHEMOTHERAPY ALERT CARD or IMMUNOTHERAPY ALERT CARD at check-in to the  Emergency Department and triage nurse.  Should you have questions after your visit or need to cancel or reschedule your appointment, please contact [facility]  Dept: [phone]  and follow the prompts.  Office hours are 8:00 a.m. to 4:30 p.m. Monday - Friday. Please note that voicemails left after 4:00 p.m. may not be returned until the following business day.  We are closed weekends and major holidays. You have access to a nurse at all times for urgent questions. Please call the main number to the clinic Dept: [phone] and follow the prompts.   For any non-urgent questions, you may also contact your provider using MyChart. We now offer e-Visits for anyone 69 and older to request care online for non-urgent symptoms. For details visit [email or web address].   Also download the MyChart app! Go to the app store, search "MyChart", open the app, select [facility], and log in with your MyChart username and password.  Due to Covid, a mask is required upon entering the hospital/clinic. If you do not have a mask, one will be given to you upon arrival. For doctor visits, patients may have 1 support person aged 69 or older with them. For treatment visits, patients cannot have anyone with them due to current Covid guidelines and our immunocompromised population.

## 2020-12-18 ENCOUNTER — Ambulatory Visit (HOSPITAL_COMMUNITY)
Admission: RE | Admit: 2020-12-18 | Discharge: 2020-12-18 | Disposition: A | Discharge status: Home / Self Care | Payer: Medicare Other | Source: Ambulatory Visit | Attending: Physician Assistant | Admitting: Physician Assistant

## 2020-12-18 ENCOUNTER — Encounter (HOSPITAL_COMMUNITY): Payer: Self-pay

## 2020-12-18 ENCOUNTER — Other Ambulatory Visit: Payer: Self-pay

## 2020-12-18 DIAGNOSIS — K5730 Diverticulosis of large intestine without perforation or abscess without bleeding: Secondary | ICD-10-CM | POA: Diagnosis not present

## 2020-12-18 DIAGNOSIS — Z85110 Personal history of malignant carcinoid tumor of bronchus and lung: Secondary | ICD-10-CM | POA: Diagnosis not present

## 2020-12-18 DIAGNOSIS — K769 Liver disease, unspecified: Secondary | ICD-10-CM | POA: Diagnosis not present

## 2020-12-18 DIAGNOSIS — R918 Other nonspecific abnormal finding of lung field: Secondary | ICD-10-CM | POA: Diagnosis not present

## 2020-12-18 DIAGNOSIS — D7389 Other diseases of spleen: Secondary | ICD-10-CM | POA: Diagnosis not present

## 2020-12-18 DIAGNOSIS — J439 Emphysema, unspecified: Secondary | ICD-10-CM | POA: Diagnosis not present

## 2020-12-18 DIAGNOSIS — I700 Atherosclerosis of aorta: Secondary | ICD-10-CM | POA: Diagnosis not present

## 2020-12-18 DIAGNOSIS — C3491 Malignant neoplasm of unspecified part of right bronchus or lung: Secondary | ICD-10-CM | POA: Diagnosis not present

## 2020-12-18 DIAGNOSIS — R911 Solitary pulmonary nodule: Secondary | ICD-10-CM | POA: Diagnosis not present

## 2020-12-18 MED ORDER — HEPARIN SOD (PORK) LOCK FLUSH 100 UNIT/ML IV SOLN
INTRAVENOUS | Status: AC
  Administered 2020-12-18: 500 [IU] via INTRAVENOUS
  Filled 2020-12-18: qty 5

## 2020-12-18 MED ORDER — IOHEXOL 350 MG/ML SOLN
75.0000 mL | Freq: Once | INTRAVENOUS | Status: AC | PRN
  Administered 2020-12-18: 75 mL via INTRAVENOUS

## 2020-12-18 MED ORDER — SODIUM CHLORIDE (PF) 0.9 % IJ SOLN
INTRAMUSCULAR | Status: AC
  Filled 2020-12-18: qty 50

## 2020-12-18 MED ORDER — HEPARIN SOD (PORK) LOCK FLUSH 100 UNIT/ML IV SOLN: 500.0000 [IU] | Freq: Once | INTRAVENOUS | Status: AC

## 2020-12-20 ENCOUNTER — Ambulatory Visit: Payer: Medicare Other

## 2020-12-20 ENCOUNTER — Inpatient Hospital Stay: Payer: Medicare Other

## 2020-12-20 ENCOUNTER — Other Ambulatory Visit: Payer: Medicare Other

## 2020-12-20 ENCOUNTER — Inpatient Hospital Stay: Payer: Medicare Other | Attending: Internal Medicine

## 2020-12-20 ENCOUNTER — Inpatient Hospital Stay (HOSPITAL_BASED_OUTPATIENT_CLINIC_OR_DEPARTMENT_OTHER): Payer: Medicare Other | Admitting: Internal Medicine

## 2020-12-20 ENCOUNTER — Other Ambulatory Visit: Payer: Self-pay

## 2020-12-20 ENCOUNTER — Ambulatory Visit: Payer: Medicare Other | Admitting: Physician Assistant

## 2020-12-20 VITALS — BP 137/75 | HR 77 | Temp 97.5°F | Resp 18 | Ht 66.0 in | Wt 152.4 lb

## 2020-12-20 DIAGNOSIS — Z5111 Encounter for antineoplastic chemotherapy: Secondary | ICD-10-CM

## 2020-12-20 DIAGNOSIS — C3491 Malignant neoplasm of unspecified part of right bronchus or lung: Secondary | ICD-10-CM

## 2020-12-20 DIAGNOSIS — C3411 Malignant neoplasm of upper lobe, right bronchus or lung: Secondary | ICD-10-CM | POA: Diagnosis not present

## 2020-12-20 DIAGNOSIS — Z79899 Other long term (current) drug therapy: Secondary | ICD-10-CM | POA: Diagnosis not present

## 2020-12-20 DIAGNOSIS — Z5112 Encounter for antineoplastic immunotherapy: Secondary | ICD-10-CM

## 2020-12-20 DIAGNOSIS — R5382 Chronic fatigue, unspecified: Secondary | ICD-10-CM

## 2020-12-20 LAB — CMP (CANCER CENTER ONLY)
ALT: 16 U/L (ref 0–44)
AST: 16 U/L (ref 15–41)
Albumin: 4.4 g/dL (ref 3.5–5.0)
Alkaline Phosphatase: 55 U/L (ref 38–126)
Anion gap: 11 (ref 5–15)
BUN: 16 mg/dL (ref 8–23)
CO2: 26 mmol/L (ref 22–32)
Calcium: 9.4 mg/dL (ref 8.9–10.3)
Chloride: 101 mmol/L (ref 98–111)
Creatinine: 1.2 mg/dL (ref 0.61–1.24)
GFR, Estimated: >60 mL/min (ref >60)
Glucose, Bld: 80 mg/dL (ref 70–99)
Potassium: 4.4 mmol/L (ref 3.5–5.1)
Sodium: 138 mmol/L (ref 135–145)
Total Bilirubin: 0.7 mg/dL (ref 0.3–1.2)
Total Protein: 6.8 g/dL (ref 6.5–8.1)

## 2020-12-20 LAB — CBC WITH DIFFERENTIAL (CANCER CENTER ONLY)
Abs Immature Granulocytes: 0.04 10*3/uL (ref 0.00–0.07)
Basophils Absolute: 0.1 10*3/uL (ref 0.0–0.1)
Basophils Relative: 1 %
Eosinophils Absolute: 0.4 10*3/uL (ref 0.0–0.5)
Eosinophils Relative: 5 %
HCT: 42 % (ref 39.0–52.0)
Hemoglobin: 15.1 g/dL (ref 13.0–17.0)
Immature Granulocytes: 1 %
Lymphocytes Relative: 20 %
Lymphs Abs: 1.5 10*3/uL (ref 0.7–4.0)
MCH: 31.7 pg (ref 26.0–34.0)
MCHC: 36 g/dL (ref 30.0–36.0)
MCV: 88.2 fL (ref 80.0–100.0)
Monocytes Absolute: 0.6 10*3/uL (ref 0.1–1.0)
Monocytes Relative: 7 %
Neutro Abs: 5.3 10*3/uL (ref 1.7–7.7)
Neutrophils Relative %: 66 %
Platelet Count: 195 10*3/uL (ref 150–400)
RBC: 4.76 MIL/uL (ref 4.22–5.81)
RDW: 12.2 % (ref 11.5–15.5)
WBC Count: 7.9 10*3/uL (ref 4.0–10.5)
nRBC: 0 % (ref 0.0–0.2)

## 2020-12-20 LAB — TSH: TSH: 1.541 u[IU]/mL (ref 0.320–4.118)

## 2020-12-20 MED ORDER — HEPARIN SOD (PORK) LOCK FLUSH 100 UNIT/ML IV SOLN
500.0000 [IU] | Freq: Once | INTRAVENOUS | Status: AC | PRN
  Administered 2020-12-20: 500 [IU]

## 2020-12-20 MED ORDER — SODIUM CHLORIDE 0.9 % IV SOLN
350.0000 mg | Freq: Once | INTRAVENOUS | Status: AC
  Administered 2020-12-20: 350 mg via INTRAVENOUS
  Filled 2020-12-20: qty 7

## 2020-12-20 MED ORDER — SODIUM CHLORIDE 0.9% FLUSH
10.0000 mL | INTRAVENOUS | Status: DC | PRN
  Administered 2020-12-20: 10 mL

## 2020-12-20 MED ORDER — SODIUM CHLORIDE 0.9 % IV SOLN: Freq: Once | INTRAVENOUS | Status: AC

## 2020-12-20 NOTE — Patient Instructions (Signed)

## 2020-12-20 NOTE — Patient Instructions (Signed)
[facility] ONCOLOGY   Discharge Instructions: Thank you for choosing [facility] to provide your oncology and hematology care.   If you have a lab appointment with the [facility], please go directly to the [facility] and check in at the registration area.   Wear comfortable clothing and clothing appropriate for easy access to any Portacath or PICC line.   We strive to give you quality time with your provider. You may need to reschedule your appointment if you arrive late (15 or more minutes).  Arriving late affects you and other patients whose appointments are after yours.  Also, if you miss three or more appointments without notifying the office, you may be dismissed from the clinic at the provider's discretion.      For prescription refill requests, have your pharmacy contact our office and allow 72 hours for refills to be completed.    Today you received the following chemotherapy and/or immunotherapy agents: cemiplimab      To help prevent nausea and vomiting after your treatment, we encourage you to take your nausea medication as directed.  BELOW ARE SYMPTOMS THAT SHOULD BE REPORTED IMMEDIATELY: *FEVER GREATER THAN 100.4 F (38 C) OR HIGHER *CHILLS OR SWEATING *NAUSEA AND VOMITING THAT IS NOT CONTROLLED WITH YOUR NAUSEA MEDICATION *UNUSUAL SHORTNESS OF BREATH *UNUSUAL BRUISING OR BLEEDING *URINARY PROBLEMS (pain or burning when urinating, or frequent urination) *BOWEL PROBLEMS (unusual diarrhea, constipation, pain near the anus) TENDERNESS IN MOUTH AND THROAT WITH OR WITHOUT PRESENCE OF ULCERS (sore throat, sores in mouth, or a toothache) UNUSUAL RASH, SWELLING OR PAIN  UNUSUAL VAGINAL DISCHARGE OR ITCHING   Items with * indicate a potential emergency and should be followed up as soon as possible or go to the Emergency Department if any problems should occur.  Please show the CHEMOTHERAPY ALERT CARD or IMMUNOTHERAPY ALERT CARD at check-in  to the Emergency Department and triage nurse.  Should you have questions after your visit or need to cancel or reschedule your appointment, please contact [facility]  Dept: [phone]  and follow the prompts.  Office hours are 8:00 a.m. to 4:30 p.m. Monday - Friday. Please note that voicemails left after 4:00 p.m. may not be returned until the following business day.  We are closed weekends and major holidays. You have access to a nurse at all times for urgent questions. Please call the main number to the clinic Dept: [phone] and follow the prompts.   For any non-urgent questions, you may also contact your provider using MyChart. We now offer e-Visits for anyone 30 and older to request care online for non-urgent symptoms. For details visit [email or web address].   Also download the MyChart app! Go to the app store, search "MyChart", open the app, select [facility], and log in with your MyChart username and password.  Due to Covid, a mask is required upon entering the hospital/clinic. If you do not have a mask, one will be given to you upon arrival. For doctor visits, patients may have 1 support person aged 56 or older with them. For treatment visits, patients cannot have anyone with them due to current Covid guidelines and our immunocompromised population.

## 2020-12-20 NOTE — Progress Notes (Signed)
[place] [phone]   [phone]  OFFICE PROGRESS NOTE  [name], MD [address] [place] [address]  DIAGNOSIS: Metastatic non-small cell lung cancer initially diagnosed as stage IIB (T1b, N1, M0) non-small cell lung cancer, invasive poorly differentiated carcinoma diagnosed in June 2021 with disease recurrence in November 2021 Molecular studies are negative for EGFR mutation as well as ALK gene translocation.  PD-L1 expression was 50-100%.  This is based on the report from the [employer] trial. Molecular studies by [employer]: Showed no actionable mutations and PD-L1 expression was 90%  PRIOR THERAPY:  1) Status post right upper lobectomy with lymph node dissection on June 17, 2019 under the care of Dr. Roxan Hockey. 2) Adjuvant systemic chemotherapy with cisplatin 75 mg/M2 and Alimta 500 mg/M2 every 3 weeks.  First dose September 02, 2019. Status post 3 cycles.  His treatment was discontinued secondary to intolerance.  CURRENT THERAPY: First-line treatment with immunotherapy with Libtayo (Cempilimab) 350 mg IV every 3 weeks status post 15 cycles.  INTERVAL HISTORY: Luis [name] 70 y.o. male returns to the clinic today for follow-up visit.  The patient is feeling fine today with no concerning complaints except for few episodes of diarrhea after the oral contrast.  He denied having any current chest pain, shortness of breath, cough or hemoptysis.  He has no nausea, vomiting, abdominal pain or constipation.  He has no headache or visual changes.  He denied having any recent weight loss or night sweats.  He has no headache or visual changes.  He continues to tolerate his treatment with cemiplimab fairly well.  He is here today for evaluation with repeat CT scan of the chest, abdomen and pelvis for restaging of his disease.   MEDICAL HISTORY: Past Medical History:  Diagnosis Date   Arthritis    through out body   Chronic  bronchitis (HCC)    Concussion    COPD (chronic obstructive pulmonary disease) (HCC)    Dyspnea    Emphysema lung (HCC)    Fatigue    Frozen shoulder    LEFT   GERD (gastroesophageal reflux disease)    Hard of hearing    Headache    alot of days, related to spine issues   Hypertension    Lumbar radiculopathy 2013   nscl ca 04/2019   Peyronie's disease    Prostatitis 2011   Dr. Gaynelle Arabian   RLS (restless legs syndrome)    Shingles 06/13/2018   shingles on back, finished with prednisone, stills has scabs and pain   Thigh pain    Bilateral Thigh   Thigh pain 10/2011   bilateral   Tobacco abuse 2010   still uses electronic cig/ emphysema, SOB easily   Vertigo    per wifes update   Wears partial dentures    upper    ALLERGIES:  is allergic to ativan [lorazepam], doxycycline, and tramadol.  MEDICATIONS:  Current Outpatient Medications  Medication Sig Dispense Refill   acetaminophen (TYLENOL) 500 MG tablet Take 1,000 mg by mouth at bedtime as needed for moderate pain.     Cholecalciferol (VITAMIN D) 125 MCG (5000 UT) CAPS Take 5,000 Units by mouth 3 (three) times a week.     citalopram (CELEXA) 20 MG tablet Take 1 tablet by mouth daily.     Fluticasone-Salmeterol (ADVAIR) 100-50 MCG/DOSE AEPB Inhale 2 puffs into the lungs 2 (two) times daily.     lidocaine-prilocaine (EMLA) cream Apply  1 application topically as needed. Apply 1 tsp over port site at least 45 minutes prior to lab appointment.Do not rub in cream. Cover with plastic wrap. 30 g 0   Multiple Vitamin (MULTIVITAMIN) tablet Take 1 tablet by mouth daily.     valsartan-hydrochlorothiazide (DIOVAN-HCT) 80-12.5 MG tablet Take 1 tablet by mouth daily.     vitamin B-12 (CYANOCOBALAMIN) 500 MCG tablet 1 tablet     No current facility-administered medications for this visit.    SURGICAL HISTORY:  Past Surgical History:  Procedure Laterality Date   CATARACT EXTRACTION W/PHACO Right 07/08/2018   Procedure: CATARACT  EXTRACTION PHACO AND INTRAOCULAR LENS PLACEMENT ([place]) RIGHT;  Surgeon: Leandrew Koyanagi, MD;  Location: [facility];  Service: Ophthalmology;  Laterality: Right;   CATARACT EXTRACTION W/PHACO Left 07/29/2018   Procedure: CATARACT EXTRACTION PHACO AND INTRAOCULAR LENS PLACEMENT ([place]) LEFT TORIC LENS;  Surgeon: Leandrew Koyanagi, MD;  Location: [facility];  Service: Ophthalmology;  Laterality: Left;   CHEST TUBE INSERTION Right 07/01/2019   Procedure: CHEST TUBE INSERTION;  Surgeon: [name], MD;  Location: [facility];  Service: Thoracic;  Laterality: Right;   COLONOSCOPY     EYE SURGERY     INTERCOSTAL NERVE BLOCK Right 06/17/2019   Procedure: INTERCOSTAL NERVE BLOCK;  Surgeon: [name], MD;  Location: [facility];  Service: Thoracic;  Laterality: Right;   INTERCOSTAL NERVE BLOCK Right 07/01/2019   Procedure: INTERCOSTAL NERVE BLOCK;  Surgeon: [name], MD;  Location: [facility];  Service: Thoracic;  Laterality: Right;   IR IMAGING GUIDED PORT INSERTION  02/21/2020   TONSILLECTOMY     VIDEO ASSISTED THORACOSCOPY Right 07/01/2019   Procedure: VIDEO ASSISTED THORACOSCOPY - REMOVAL OF RIGHT MIDDLE LOBE BLEBS;  Surgeon: [name], MD;  Location: [facility];  Service: Thoracic;  Laterality: Right;   VIDEO BRONCHOSCOPY WITH INSERTION OF INTERBRONCHIAL VALVE (IBV) Right 06/25/2019   Procedure: VIDEO BRONCHOSCOPY WITH INSERTION OF INTERBRONCHIAL VALVE (IBV); Size 7 IBV into Right Loer Lobe (RLL) Superior Segment, Size 9 IBV into RLL Basilar Segment;  Surgeon: [name], MD;  Location: [facility];  Service: Thoracic;  Laterality: Right;   VIDEO BRONCHOSCOPY WITH INSERTION OF INTERBRONCHIAL VALVE (IBV) N/A 08/06/2019   Procedure: VIDEO BRONCHOSCOPY WITH REMOVAL OF INTERBRONCHIAL VALVE (IBV);  Surgeon: [name], MD;  Location[facility];  Service: Thoracic;  Laterality: N/A;    REVIEW OF SYSTEMS:  Constitutional: negative Eyes: negative Ears,  nose, mouth, throat, and face: negative Respiratory: negative Cardiovascular: negative Gastrointestinal: positive for diarrhea Genitourinary:negative Integument/breast: negative Hematologic/lymphatic: negative Musculoskeletal:negative Neurological: negative Behavioral/Psych: negative Endocrine: negative Allergic/Immunologic: negative   PHYSICAL EXAMINATION: General appearance: alert, cooperative, and no distress Head: Normocephalic, without obvious abnormality, atraumatic Neck: no adenopathy, no JVD, supple, symmetrical, trachea midline, and thyroid not enlarged, symmetric, no tenderness/mass/nodules Lymph nodes: Cervical, supraclavicular, and axillary nodes normal. Resp: clear to auscultation bilaterally Back: symmetric, no curvature. ROM normal. No CVA tenderness. Cardio: regular rate and rhythm, S1, S2 normal, no murmur, click, rub or gallop GI: soft, non-tender; bowel sounds normal; no masses,  no organomegaly Extremities: extremities normal, atraumatic, no cyanosis or edema Neurologic: Alert and oriented X 3, normal strength and tone. Normal symmetric reflexes. Normal coordination and gait  ECOG PERFORMANCE STATUS: 0 - Asymptomatic  Blood pressure 137/75, pulse 77, temperature (!) 97.5 F (36.4 C), temperature source Tympanic, resp. rate 18, height 5' 6"  (1.676 m), weight 152 lb 6.4 oz (69.1 kg), SpO2 100 %.  LABORATORY DATA: Lab Results  Component Value Date   WBC 7.9 12/20/2020   HGB 15.1 12/20/2020   HCT 42.0 12/20/2020   MCV 88.2 12/20/2020   PLT 195 12/20/2020      Chemistry      Component Value Date/Time   NA 137 11/29/2020 1005   K 4.1 11/29/2020 1005   CL 100 11/29/2020 1005   CO2 27 11/29/2020 1005   BUN 15 11/29/2020 1005   CREATININE 1.25 (H) 11/29/2020 1005      Component Value Date/Time   CALCIUM 9.4 11/29/2020 1005   ALKPHOS 63 11/29/2020 1005   AST 15 11/29/2020 1005   ALT 15 11/29/2020 1005   BILITOT 0.6 11/29/2020 1005        RADIOGRAPHIC STUDIES: CT Chest W Contrast  Result Date: 12/18/2020 CLINICAL DATA:  70 year old male with history of lung cancer. Evaluate for metastatic disease. EXAM: CT CHEST, ABDOMEN, AND PELVIS WITH CONTRAST TECHNIQUE: Multidetector CT imaging of the chest, abdomen and pelvis was performed following the standard protocol during bolus administration of intravenous contrast. CONTRAST:  44m OMNIPAQUE IOHEXOL 350 MG/ML SOLN COMPARISON:  CT the chest 09/25/2020. CT the abdomen and pelvis 04/05/2020. FINDINGS: CT CHEST FINDINGS Cardiovascular: Heart size is normal. There is no significant pericardial fluid, thickening or pericardial calcification. There is aortic atherosclerosis, as well as atherosclerosis of the great vessels of the mediastinum and the coronary arteries, including calcified atherosclerotic plaque in the left anterior descending coronary artery. Right internal jugular single-lumen porta cath with tip terminating in the right atrium. Mediastinum/Nodes: No pathologically enlarged mediastinal or hilar lymph nodes. Esophagus is unremarkable in appearance. No axillary lymphadenopathy. Lungs/Pleura: Status post right upper lobectomy. Compensatory hyperexpansion of the right middle and lower lobes. Mixed solid and sub solid lesion in the posterior aspect of the left upper lobe is similar to the prior study, with a ground-glass attenuation component measuring 2.2 x 1.0 cm (axial image 46 of series 6), and a central solid component measuring 6 mm (axial image 44 of series 6). A smaller predominantly ground-glass lesion is noted in the posterior aspect of the left lower lobe (axial image 58 of series 6) measuring 12 x 10 mm (previously 11 x 7 mm). No other new suspicious appearing pulmonary nodules or masses are noted. No acute consolidative airspace disease. Trace amount of pleural thickening and fluid in the right hemithorax again noted. No left pleural effusion. Diffuse bronchial wall thickening  with moderate centrilobular and paraseptal emphysema. Musculoskeletal: Multiple old left-sided rib fractures involving the lateral aspect of the left third, fourth, fifth and sixth ribs. There are no aggressive appearing lytic or blastic lesions noted in the visualized portions of the skeleton. CT ABDOMEN PELVIS FINDINGS Hepatobiliary: 1.4 x 0.9 cm low-attenuation lesion in segment 2 of the liver, compatible with a simple cyst. No other suspicious appearing hepatic lesions. No intra or extrahepatic biliary ductal dilatation. Gallbladder is normal in appearance. Pancreas: No pancreatic mass. No pancreatic ductal dilatation. No pancreatic or peripancreatic fluid collections or inflammatory changes. Spleen: Numerous calcified granulomas throughout the spleen. Otherwise, unremarkable. Adrenals/Urinary Tract: Low-attenuation lesions in both kidneys compatible with simple cysts, largest of which is in the medial aspect of the lower pole of the right kidney measuring 5.4 x 3.8 cm. No suspicious renal lesions. No hydroureteronephrosis. Bilateral adrenal glands are normal in appearance. Urinary bladder is normal in appearance. Stomach/Bowel: The appearance of the stomach is normal. No pathologic dilatation of small bowel or colon. Numerous colonic diverticulae are noted, without surrounding inflammatory changes to suggest an  acute diverticulitis at this time. Normal appendix. Vascular/Lymphatic: Aortic atherosclerosis, without evidence of aneurysm or dissection in the abdominal or pelvic vasculature. No lymphadenopathy noted in the abdomen or pelvis. Reproductive: Prostate gland and seminal vesicles are unremarkable in appearance. Other: No significant volume of ascites.  No pneumoperitoneum. Musculoskeletal: There are no aggressive appearing lytic or blastic lesions noted in the visualized portions of the skeleton. IMPRESSION: 1. Similar appearance of pulmonary nodules, as detailed above. Continued attention on follow-up  studies is recommended, as findings remain concerning for slow-growing neoplasm such as adenocarcinoma. No new suspicious appearing pulmonary nodules or masses are otherwise noted. No evidence of metastatic disease in the abdomen or pelvis. 2. Diffuse bronchial wall thickening with moderate centrilobular and paraseptal emphysema. 3. Colonic diverticulosis without evidence of acute diverticulitis at this time. 4. Additional incidental findings, as above. Electronically Signed   By: Vinnie Langton M.D.   On: 12/18/2020 15:29   CT Abdomen Pelvis W Contrast  Result Date: 12/18/2020 CLINICAL DATA:  70 year old male with history of lung cancer. Evaluate for metastatic disease. EXAM: CT CHEST, ABDOMEN, AND PELVIS WITH CONTRAST TECHNIQUE: Multidetector CT imaging of the chest, abdomen and pelvis was performed following the standard protocol during bolus administration of intravenous contrast. CONTRAST:  26m OMNIPAQUE IOHEXOL 350 MG/ML SOLN COMPARISON:  CT the chest 09/25/2020. CT the abdomen and pelvis 04/05/2020. FINDINGS: CT CHEST FINDINGS Cardiovascular: Heart size is normal. There is no significant pericardial fluid, thickening or pericardial calcification. There is aortic atherosclerosis, as well as atherosclerosis of the great vessels of the mediastinum and the coronary arteries, including calcified atherosclerotic plaque in the left anterior descending coronary artery. Right internal jugular single-lumen porta cath with tip terminating in the right atrium. Mediastinum/Nodes: No pathologically enlarged mediastinal or hilar lymph nodes. Esophagus is unremarkable in appearance. No axillary lymphadenopathy. Lungs/Pleura: Status post right upper lobectomy. Compensatory hyperexpansion of the right middle and lower lobes. Mixed solid and sub solid lesion in the posterior aspect of the left upper lobe is similar to the prior study, with a ground-glass attenuation component measuring 2.2 x 1.0 cm (axial image 46 of  series 6), and a central solid component measuring 6 mm (axial image 44 of series 6). A smaller predominantly ground-glass lesion is noted in the posterior aspect of the left lower lobe (axial image 58 of series 6) measuring 12 x 10 mm (previously 11 x 7 mm). No other new suspicious appearing pulmonary nodules or masses are noted. No acute consolidative airspace disease. Trace amount of pleural thickening and fluid in the right hemithorax again noted. No left pleural effusion. Diffuse bronchial wall thickening with moderate centrilobular and paraseptal emphysema. Musculoskeletal: Multiple old left-sided rib fractures involving the lateral aspect of the left third, fourth, fifth and sixth ribs. There are no aggressive appearing lytic or blastic lesions noted in the visualized portions of the skeleton. CT ABDOMEN PELVIS FINDINGS Hepatobiliary: 1.4 x 0.9 cm low-attenuation lesion in segment 2 of the liver, compatible with a simple cyst. No other suspicious appearing hepatic lesions. No intra or extrahepatic biliary ductal dilatation. Gallbladder is normal in appearance. Pancreas: No pancreatic mass. No pancreatic ductal dilatation. No pancreatic or peripancreatic fluid collections or inflammatory changes. Spleen: Numerous calcified granulomas throughout the spleen. Otherwise, unremarkable. Adrenals/Urinary Tract: Low-attenuation lesions in both kidneys compatible with simple cysts, largest of which is in the medial aspect of the lower pole of the right kidney measuring 5.4 x 3.8 cm. No suspicious renal lesions. No hydroureteronephrosis. Bilateral adrenal glands are normal in  appearance. Urinary bladder is normal in appearance. Stomach/Bowel: The appearance of the stomach is normal. No pathologic dilatation of small bowel or colon. Numerous colonic diverticulae are noted, without surrounding inflammatory changes to suggest an acute diverticulitis at this time. Normal appendix. Vascular/Lymphatic: Aortic atherosclerosis,  without evidence of aneurysm or dissection in the abdominal or pelvic vasculature. No lymphadenopathy noted in the abdomen or pelvis. Reproductive: Prostate gland and seminal vesicles are unremarkable in appearance. Other: No significant volume of ascites.  No pneumoperitoneum. Musculoskeletal: There are no aggressive appearing lytic or blastic lesions noted in the visualized portions of the skeleton. IMPRESSION: 1. Similar appearance of pulmonary nodules, as detailed above. Continued attention on follow-up studies is recommended, as findings remain concerning for slow-growing neoplasm such as adenocarcinoma. No new suspicious appearing pulmonary nodules or masses are otherwise noted. No evidence of metastatic disease in the abdomen or pelvis. 2. Diffuse bronchial wall thickening with moderate centrilobular and paraseptal emphysema. 3. Colonic diverticulosis without evidence of acute diverticulitis at this time. 4. Additional incidental findings, as above. Electronically Signed   By: Vinnie Langton M.D.   On: 12/18/2020 15:29    ASSESSMENT AND PLAN: This is a very pleasant 70 years old white male recently diagnosed with stage IIb (T1b, N1, M0) non-small cell lung cancer, adenocarcinoma status post right upper lobectomy with lymph node dissection under the care of Dr. Roxan Hockey on June 17, 2019. The patient had molecular studies on the [facility] clinical trial that showed negative for EGFR mutation as well as ALK gene translocation but PD-L1 expression in the range of 50-100%. The patient underwent standard adjuvant systemic chemotherapy with cisplatin 75 mg/M2 and Alimta 500 mg/M2 every 3 weeks status post 3 cycles.  His treatment was discontinued secondary to intolerance. Unfortunately repeat CT scan as well as PET scan after completion of the adjuvant chemotherapy showed concerning findings for disease recurrence with small right pleural effusion as well as multiple pleural implants consistent with  metastatic disease which was confirmed with repeat biopsy. He had molecular studies by [employer] and the blood test showed no actionable mutation.  His PD-L1 expression was 90%. The patient is currently undergoing treatment with immunotherapy with Libtayo (Cempilimab) 350 mg IV every 3 weeks status post 15 cycles. The patient has been tolerating his treatment with Libtayo (Cempilimab) fairly well with no concerning adverse effects. He had repeat CT scan of the chest, abdomen pelvis performed recently.  I personally and independently reviewed the scan and discussed the results with the patient today. His scan showed no concerning findings for disease recurrence and he has a stable appearance of the pulmonary nodules. I recommended for him to continue his current treatment with Libtayo (Cempilimab) and he will proceed with cycle #16 today. The patient will come back for follow-up visit in 3 weeks for evaluation before the next cycle of his treatment. He was advised to call immediately if he has any other concerning symptoms in the interval. The patient voices understanding of current disease status and treatment options and is in agreement with the current care plan.  All questions were answered. The patient knows to call the clinic with any problems, questions or concerns. We can certainly see the patient much sooner if necessary.  Disclaimer: This note was dictated with voice recognition software. Similar sounding words can inadvertently be transcribed and may not be corrected upon review.

## 2021-01-03 NOTE — Progress Notes (Signed)
[facility] OFFICE PROGRESS NOTE  Luis Huddle, MD [place] [address] [place]  [address]  DIAGNOSIS:  Metastatic non-small cell lung cancer initially diagnosed as stage IIB (T1b, N1, M0) non-small cell lung cancer, invasive poorly differentiated carcinoma diagnosed in June 2021 with disease recurrence in November 2021 Molecular studies are negative for EGFR mutation as well as ALK gene translocation.  PD-L1 expression was 50-100%.  This is based on the report from the [facility] trial. Molecular studies by [employer]: Showed no actionable mutations and PD-L1 expression was 90%  PRIOR THERAPY: 1) Status post right upper lobectomy with lymph node dissection on June 17, 2019 under the care of Dr. Roxan Hockey. 2) Adjuvant systemic chemotherapy with cisplatin 75 mg/M2 and Alimta 500 mg/M2 every 3 weeks.  First dose September 02, 2019. Status post 3 cycles.  His treatment was discontinued secondary to intolerance.  CURRENT THERAPY: First-line treatment with immunotherapy with Libtayo (Cempilimab) 350 mg IV every 3 weeks status post 16 cycles. First dose on 01/24/20.  INTERVAL HISTORY: Luis [name] 70 y.o. male returns to the clinic today for a follow up visit. The patient is feeling well today without any concerning complaints. The patient continues to tolerate treatment with immunotherapy well he sometimes has mild systemic itching without rash. He does report 2-3 days of diarrhea following his last treatment, which is not unusual for him. Then his diarrhea will resolve completely. For these days he reported about 3 episodes of diarrhea per day.  He states he has been prone to having diarrhea "all his life". He denies any blood in his stools or abdominal pain. He denies hemoptysis. Denies weight loss.  He denies fever or chills.  He reports his baseline dyspnea on exertion due to COPD, as well as baseline chest pain along his lower right chest wall from his prior surgery. He has  a very mild dry cough. He states it is not bad enough to warrant any cough medication. He denies any nausea, vomiting, or constipation. He denies any visual changes.  He states that he has daily headaches in the occipital region which has been occurring "my whole life" which she states is secondary to a disc in his neck.  He states that it usually is worse in the morning but after 45 minutes of getting up it resolves completely.  The patient is here today for evaluation before starting cycle #17.    MEDICAL HISTORY: Past Medical History:  Diagnosis Date   Arthritis    through out body   Chronic bronchitis (HCC)    Concussion    COPD (chronic obstructive pulmonary disease) (HCC)    Dyspnea    Emphysema lung (HCC)    Fatigue    Frozen shoulder    LEFT   GERD (gastroesophageal reflux disease)    Hard of hearing    Headache    alot of days, related to spine issues   Hypertension    Lumbar radiculopathy 2013   nscl ca 04/2019   Peyronie's disease    Prostatitis 2011   Dr. Gaynelle Arabian   RLS (restless legs syndrome)    Shingles 06/13/2018   shingles on back, finished with prednisone, stills has scabs and pain   Thigh pain    Bilateral Thigh   Thigh pain 10/2011   bilateral   Tobacco abuse 2010   still uses electronic cig/ emphysema, SOB easily   Vertigo    per wifes update   Wears partial dentures  upper    ALLERGIES:  is allergic to ativan [lorazepam], doxycycline, and tramadol.  MEDICATIONS:  Current Outpatient Medications  Medication Sig Dispense Refill   acetaminophen (TYLENOL) 500 MG tablet Take 1,000 mg by mouth at bedtime as needed for moderate pain.     Cholecalciferol (VITAMIN D) 125 MCG (5000 UT) CAPS Take 5,000 Units by mouth 3 (three) times a week.     citalopram (CELEXA) 20 MG tablet Take 1 tablet by mouth daily.     Fluticasone-Salmeterol (ADVAIR) 100-50 MCG/DOSE AEPB Inhale 2 puffs into the lungs 2 (two) times daily.     lidocaine-prilocaine (EMLA) cream  Apply 1 application topically as needed. Apply 1 tsp over port site at least 45 minutes prior to lab appointment.Do not rub in cream. Cover with plastic wrap. 30 g 0   Multiple Vitamin (MULTIVITAMIN) tablet Take 1 tablet by mouth daily.     valsartan-hydrochlorothiazide (DIOVAN-HCT) 80-12.5 MG tablet Take 1 tablet by mouth daily.     vitamin B-12 (CYANOCOBALAMIN) 500 MCG tablet 1 tablet     No current facility-administered medications for this visit.    SURGICAL HISTORY:  Past Surgical History:  Procedure Laterality Date   CATARACT EXTRACTION W/PHACO Right 07/08/2018   Procedure: CATARACT EXTRACTION PHACO AND INTRAOCULAR LENS PLACEMENT ([facility]) RIGHT;  Surgeon: Leandrew Koyanagi, MD;  Location: [facility];  Service: Ophthalmology;  Laterality: Right;   CATARACT EXTRACTION W/PHACO Left 07/29/2018   Procedure: CATARACT EXTRACTION PHACO AND INTRAOCULAR LENS PLACEMENT ([facility]) LEFT TORIC LENS;  Surgeon: Leandrew Koyanagi, MD;  Location: [facility];  Service: Ophthalmology;  Laterality: Left;   CHEST TUBE INSERTION Right 07/01/2019   Procedure: CHEST TUBE INSERTION;  Surgeon: [name], MD;  Location: [facility];  Service: Thoracic;  Laterality: Right;   COLONOSCOPY     EYE SURGERY     INTERCOSTAL NERVE BLOCK Right 06/17/2019   Procedure: INTERCOSTAL NERVE BLOCK;  Surgeon: [name], MD;  Location: [facility];  Service: Thoracic;  Laterality: Right;   INTERCOSTAL NERVE BLOCK Right 07/01/2019   Procedure: INTERCOSTAL NERVE BLOCK;  Surgeon: [name], MD;  Location: [facility];  Service: Thoracic;  Laterality: Right;   IR IMAGING GUIDED PORT INSERTION  02/21/2020   TONSILLECTOMY     VIDEO ASSISTED THORACOSCOPY Right 07/01/2019   Procedure: VIDEO ASSISTED THORACOSCOPY - REMOVAL OF RIGHT MIDDLE LOBE BLEBS;  Surgeon: [name], MD;  Location: [facility];  Service: Thoracic;  Laterality: Right;   VIDEO BRONCHOSCOPY WITH INSERTION OF INTERBRONCHIAL VALVE (IBV)  Right 06/25/2019   Procedure: VIDEO BRONCHOSCOPY WITH INSERTION OF INTERBRONCHIAL VALVE (IBV); Size 7 IBV into Right Loer Lobe (RLL) Superior Segment, Size 9 IBV into RLL Basilar Segment;  Surgeon: [name], MD;  Location: [facility];  Service: Thoracic;  Laterality: Right;   VIDEO BRONCHOSCOPY WITH INSERTION OF INTERBRONCHIAL VALVE (IBV) N/A 08/06/2019   Procedure: VIDEO BRONCHOSCOPY WITH REMOVAL OF INTERBRONCHIAL VALVE (IBV);  Surgeon: [name], MD;  Location[facility];  Service: Thoracic;  Laterality: N/A;    REVIEW OF SYSTEMS:   Review of Systems  Constitutional: Positive for occasional night sweats. Negative for appetite change, chills, fatigue, fever and unexpected weight change.  HENT:   Negative for mouth sores, nosebleeds, sore throat and trouble swallowing.   Eyes: Negative for eye problems and icterus.  Respiratory: Positive for mild dry cough and SOB with exertion (unchanged). Negative for hemoptysis and wheezing.   Cardiovascular: Positive for pain along right lower chest wall (unchanged). Negative  for chest tightness and leg swelling.  Gastrointestinal: Positive for diarrhea following treatment for ~2 days (unchanged). Negative for abdominal pain, constipation, nausea and vomiting.  Genitourinary: Negative for bladder incontinence, difficulty urinating, dysuria, frequency and hematuria.   Musculoskeletal: Positive for occasional right hip pain.  Negative for back pain, gait problem, neck pain and neck stiffness.  Skin: Negative for itching and rash.  Neurological: Positive for occasional occipital headache/neck discomfort.  Negative for dizziness, extremity weakness, gait problem, light-headedness and seizures.  Hematological: Negative for adenopathy. Does not bruise/bleed easily.  Psychiatric/Behavioral: Negative for confusion, depression and sleep disturbance. The patient is not nervous/anxious.      PHYSICAL EXAMINATION:  Blood pressure 129/87, pulse 72,  temperature (!) 97 F (36.1 C), temperature source Tympanic, resp. rate 18, height 5' 6"  (1.676 m), weight 154 lb 12.8 oz (70.2 kg), SpO2 97 %.  ECOG PERFORMANCE STATUS: 1  Physical Exam  Head: Normocephalic and atraumatic.  Mouth/Throat: Oropharynx is clear and moist. No oropharyngeal exudate.  Eyes: Conjunctivae are normal. Right eye exhibits no discharge. Left eye exhibits no discharge. No scleral icterus.  Neck: Normal range of motion. Neck supple.  Cardiovascular: Normal rate, regular rhythm, normal heart sounds and intact distal pulses.   Pulmonary/Chest: Effort normal. Quiet breath sounds bilaterally. No respiratory distress. No wheezes. No rales.  Abdominal: Soft. Bowel sounds are normal. Exhibits no distension and no mass. There is no tenderness.  Musculoskeletal: Normal range of motion. Exhibits no edema.  Lymphadenopathy:    No cervical adenopathy.  Neurological: Alert and oriented to person, place, and time. Exhibits normal muscle tone. Gait normal. Coordination normal.  Skin: Skin is warm and dry. No rash noted. Not diaphoretic. No erythema. No pallor.  Psychiatric: Mood, memory and judgment normal.  Vitals reviewed.  LABORATORY DATA: Lab Results  Component Value Date   WBC 8.7 01/10/2021   HGB 14.5 01/10/2021   HCT 41.6 01/10/2021   MCV 88.5 01/10/2021   PLT 201 01/10/2021      Chemistry      Component Value Date/Time   NA 137 01/10/2021 0945   K 4.2 01/10/2021 0945   CL 101 01/10/2021 0945   CO2 30 01/10/2021 0945   BUN 16 01/10/2021 0945   CREATININE 1.26 (H) 01/10/2021 0945      Component Value Date/Time   CALCIUM 9.3 01/10/2021 0945   ALKPHOS 56 01/10/2021 0945   AST 14 (L) 01/10/2021 0945   ALT 22 01/10/2021 0945   BILITOT 0.7 01/10/2021 0945       RADIOGRAPHIC STUDIES:  CT Chest W Contrast  Result Date: 12/18/2020 CLINICAL DATA:  70 year old male with history of lung cancer. Evaluate for metastatic disease. EXAM: CT CHEST, ABDOMEN, AND PELVIS  WITH CONTRAST TECHNIQUE: Multidetector CT imaging of the chest, abdomen and pelvis was performed following the standard protocol during bolus administration of intravenous contrast. CONTRAST:  67m OMNIPAQUE IOHEXOL 350 MG/ML SOLN COMPARISON:  CT the chest 09/25/2020. CT the abdomen and pelvis 04/05/2020. FINDINGS: CT CHEST FINDINGS Cardiovascular: Heart size is normal. There is no significant pericardial fluid, thickening or pericardial calcification. There is aortic atherosclerosis, as well as atherosclerosis of the great vessels of the mediastinum and the coronary arteries, including calcified atherosclerotic plaque in the left anterior descending coronary artery. Right internal jugular single-lumen porta cath with tip terminating in the right atrium. Mediastinum/Nodes: No pathologically enlarged mediastinal or hilar lymph nodes. Esophagus is unremarkable in appearance. No axillary lymphadenopathy. Lungs/Pleura: Status post right upper lobectomy. Compensatory hyperexpansion of the right  middle and lower lobes. Mixed solid and sub solid lesion in the posterior aspect of the left upper lobe is similar to the prior study, with a ground-glass attenuation component measuring 2.2 x 1.0 cm (axial image 46 of series 6), and a central solid component measuring 6 mm (axial image 44 of series 6). A smaller predominantly ground-glass lesion is noted in the posterior aspect of the left lower lobe (axial image 58 of series 6) measuring 12 x 10 mm (previously 11 x 7 mm). No other new suspicious appearing pulmonary nodules or masses are noted. No acute consolidative airspace disease. Trace amount of pleural thickening and fluid in the right hemithorax again noted. No left pleural effusion. Diffuse bronchial wall thickening with moderate centrilobular and paraseptal emphysema. Musculoskeletal: Multiple old left-sided rib fractures involving the lateral aspect of the left third, fourth, fifth and sixth ribs. There are no  aggressive appearing lytic or blastic lesions noted in the visualized portions of the skeleton. CT ABDOMEN PELVIS FINDINGS Hepatobiliary: 1.4 x 0.9 cm low-attenuation lesion in segment 2 of the liver, compatible with a simple cyst. No other suspicious appearing hepatic lesions. No intra or extrahepatic biliary ductal dilatation. Gallbladder is normal in appearance. Pancreas: No pancreatic mass. No pancreatic ductal dilatation. No pancreatic or peripancreatic fluid collections or inflammatory changes. Spleen: Numerous calcified granulomas throughout the spleen. Otherwise, unremarkable. Adrenals/Urinary Tract: Low-attenuation lesions in both kidneys compatible with simple cysts, largest of which is in the medial aspect of the lower pole of the right kidney measuring 5.4 x 3.8 cm. No suspicious renal lesions. No hydroureteronephrosis. Bilateral adrenal glands are normal in appearance. Urinary bladder is normal in appearance. Stomach/Bowel: The appearance of the stomach is normal. No pathologic dilatation of small bowel or colon. Numerous colonic diverticulae are noted, without surrounding inflammatory changes to suggest an acute diverticulitis at this time. Normal appendix. Vascular/Lymphatic: Aortic atherosclerosis, without evidence of aneurysm or dissection in the abdominal or pelvic vasculature. No lymphadenopathy noted in the abdomen or pelvis. Reproductive: Prostate gland and seminal vesicles are unremarkable in appearance. Other: No significant volume of ascites.  No pneumoperitoneum. Musculoskeletal: There are no aggressive appearing lytic or blastic lesions noted in the visualized portions of the skeleton. IMPRESSION: 1. Similar appearance of pulmonary nodules, as detailed above. Continued attention on follow-up studies is recommended, as findings remain concerning for slow-growing neoplasm such as adenocarcinoma. No new suspicious appearing pulmonary nodules or masses are otherwise noted. No evidence of  metastatic disease in the abdomen or pelvis. 2. Diffuse bronchial wall thickening with moderate centrilobular and paraseptal emphysema. 3. Colonic diverticulosis without evidence of acute diverticulitis at this time. 4. Additional incidental findings, as above. Electronically Signed   By: Vinnie Langton M.D.   On: 12/18/2020 15:29   CT Abdomen Pelvis W Contrast  Result Date: 12/18/2020 CLINICAL DATA:  70 year old male with history of lung cancer. Evaluate for metastatic disease. EXAM: CT CHEST, ABDOMEN, AND PELVIS WITH CONTRAST TECHNIQUE: Multidetector CT imaging of the chest, abdomen and pelvis was performed following the standard protocol during bolus administration of intravenous contrast. CONTRAST:  34m OMNIPAQUE IOHEXOL 350 MG/ML SOLN COMPARISON:  CT the chest 09/25/2020. CT the abdomen and pelvis 04/05/2020. FINDINGS: CT CHEST FINDINGS Cardiovascular: Heart size is normal. There is no significant pericardial fluid, thickening or pericardial calcification. There is aortic atherosclerosis, as well as atherosclerosis of the great vessels of the mediastinum and the coronary arteries, including calcified atherosclerotic plaque in the left anterior descending coronary artery. Right internal jugular single-lumen porta cath  with tip terminating in the right atrium. Mediastinum/Nodes: No pathologically enlarged mediastinal or hilar lymph nodes. Esophagus is unremarkable in appearance. No axillary lymphadenopathy. Lungs/Pleura: Status post right upper lobectomy. Compensatory hyperexpansion of the right middle and lower lobes. Mixed solid and sub solid lesion in the posterior aspect of the left upper lobe is similar to the prior study, with a ground-glass attenuation component measuring 2.2 x 1.0 cm (axial image 46 of series 6), and a central solid component measuring 6 mm (axial image 44 of series 6). A smaller predominantly ground-glass lesion is noted in the posterior aspect of the left lower lobe (axial image  58 of series 6) measuring 12 x 10 mm (previously 11 x 7 mm). No other new suspicious appearing pulmonary nodules or masses are noted. No acute consolidative airspace disease. Trace amount of pleural thickening and fluid in the right hemithorax again noted. No left pleural effusion. Diffuse bronchial wall thickening with moderate centrilobular and paraseptal emphysema. Musculoskeletal: Multiple old left-sided rib fractures involving the lateral aspect of the left third, fourth, fifth and sixth ribs. There are no aggressive appearing lytic or blastic lesions noted in the visualized portions of the skeleton. CT ABDOMEN PELVIS FINDINGS Hepatobiliary: 1.4 x 0.9 cm low-attenuation lesion in segment 2 of the liver, compatible with a simple cyst. No other suspicious appearing hepatic lesions. No intra or extrahepatic biliary ductal dilatation. Gallbladder is normal in appearance. Pancreas: No pancreatic mass. No pancreatic ductal dilatation. No pancreatic or peripancreatic fluid collections or inflammatory changes. Spleen: Numerous calcified granulomas throughout the spleen. Otherwise, unremarkable. Adrenals/Urinary Tract: Low-attenuation lesions in both kidneys compatible with simple cysts, largest of which is in the medial aspect of the lower pole of the right kidney measuring 5.4 x 3.8 cm. No suspicious renal lesions. No hydroureteronephrosis. Bilateral adrenal glands are normal in appearance. Urinary bladder is normal in appearance. Stomach/Bowel: The appearance of the stomach is normal. No pathologic dilatation of small bowel or colon. Numerous colonic diverticulae are noted, without surrounding inflammatory changes to suggest an acute diverticulitis at this time. Normal appendix. Vascular/Lymphatic: Aortic atherosclerosis, without evidence of aneurysm or dissection in the abdominal or pelvic vasculature. No lymphadenopathy noted in the abdomen or pelvis. Reproductive: Prostate gland and seminal vesicles are  unremarkable in appearance. Other: No significant volume of ascites.  No pneumoperitoneum. Musculoskeletal: There are no aggressive appearing lytic or blastic lesions noted in the visualized portions of the skeleton. IMPRESSION: 1. Similar appearance of pulmonary nodules, as detailed above. Continued attention on follow-up studies is recommended, as findings remain concerning for slow-growing neoplasm such as adenocarcinoma. No new suspicious appearing pulmonary nodules or masses are otherwise noted. No evidence of metastatic disease in the abdomen or pelvis. 2. Diffuse bronchial wall thickening with moderate centrilobular and paraseptal emphysema. 3. Colonic diverticulosis without evidence of acute diverticulitis at this time. 4. Additional incidental findings, as above. Electronically Signed   By: Vinnie Langton M.D.   On: 12/18/2020 15:29     ASSESSMENT/PLAN:  This is a very pleasant 70 year old Caucasian male with metastatic lung cancer initially diagnosed as stage IIb (T1b, N1, M0) non-small cell lung cancer, adenocarcinoma.  He is status post a right upper lobe lobectomy with lymph node dissection under the care of Dr. Roxan Hockey.  This was performed on June 17, 2019.  He has no actionable mutations.  His PD-L1 expression is 90%. He had evidence for metastatic disease in November 2021 with small right pleural effusion as well as multiple right pleural implants.   The  patient completed 3 of the 4 planned adjuvant chemotherapy cycles with cisplatin 75 mg per metered square and Alimta 500 mg/m. This was discontinued after cycle #3 due to intolerance.   When the patient was found to have evidence for disease recurrence, the patient was started on immunotherapy with Libtayo (Cempilimab) 350 mg IV every 3 weeks status post 16 cycles. He is tolerating this well.  Labs were reviewed.  Recommend that he proceed  with cycle #18 today scheduled.   We will see him back for follow-up visit in 3 weeks for  evaluation before starting cycle #18.  Discussed he may take any antihistamine such as Claritin or Zyrtec for the itching.  Discussed Benadryl will likely be more effective but it can cause drowsiness.  If neither of these options work, discussed that we can try Atarax.  He is not interested in this at this time and states that his itching is manageable.  The patient was advised to call immediately if he has any concerning symptoms in the interval. The patient voices understanding of current disease status and treatment options and is in agreement with the current care plan. All questions were answered. The patient knows to call the clinic with any problems, questions or concerns. We can certainly see the patient much sooner if necessary   No orders of the defined types were placed in this encounter.     The total time spent in the appointment was 20-29 minutes.   Jizel Cheeks L Lala Been, PA-C 01/10/21

## 2021-01-10 ENCOUNTER — Inpatient Hospital Stay: Payer: Medicare Other

## 2021-01-10 ENCOUNTER — Inpatient Hospital Stay (HOSPITAL_BASED_OUTPATIENT_CLINIC_OR_DEPARTMENT_OTHER): Payer: Medicare Other | Admitting: Physician Assistant

## 2021-01-10 ENCOUNTER — Other Ambulatory Visit: Payer: Self-pay

## 2021-01-10 ENCOUNTER — Encounter: Payer: Self-pay | Admitting: Physician Assistant

## 2021-01-10 VITALS — BP 129/87 | HR 72 | Temp 97.0°F | Resp 18 | Ht 66.0 in | Wt 154.8 lb

## 2021-01-10 DIAGNOSIS — Z79899 Other long term (current) drug therapy: Secondary | ICD-10-CM | POA: Diagnosis not present

## 2021-01-10 DIAGNOSIS — C3491 Malignant neoplasm of unspecified part of right bronchus or lung: Secondary | ICD-10-CM

## 2021-01-10 DIAGNOSIS — Z5112 Encounter for antineoplastic immunotherapy: Secondary | ICD-10-CM

## 2021-01-10 DIAGNOSIS — R5382 Chronic fatigue, unspecified: Secondary | ICD-10-CM

## 2021-01-10 DIAGNOSIS — C3411 Malignant neoplasm of upper lobe, right bronchus or lung: Secondary | ICD-10-CM | POA: Diagnosis not present

## 2021-01-10 DIAGNOSIS — Z5111 Encounter for antineoplastic chemotherapy: Secondary | ICD-10-CM

## 2021-01-10 LAB — CBC WITH DIFFERENTIAL (CANCER CENTER ONLY)
Abs Immature Granulocytes: 0.07 10*3/uL (ref 0.00–0.07)
Basophils Absolute: 0.1 10*3/uL (ref 0.0–0.1)
Basophils Relative: 1 %
Eosinophils Absolute: 0.4 10*3/uL (ref 0.0–0.5)
Eosinophils Relative: 4 %
HCT: 41.6 % (ref 39.0–52.0)
Hemoglobin: 14.5 g/dL (ref 13.0–17.0)
Immature Granulocytes: 1 %
Lymphocytes Relative: 20 %
Lymphs Abs: 1.7 10*3/uL (ref 0.7–4.0)
MCH: 30.9 pg (ref 26.0–34.0)
MCHC: 34.9 g/dL (ref 30.0–36.0)
MCV: 88.5 fL (ref 80.0–100.0)
Monocytes Absolute: 0.5 10*3/uL (ref 0.1–1.0)
Monocytes Relative: 6 %
Neutro Abs: 6 10*3/uL (ref 1.7–7.7)
Neutrophils Relative %: 68 %
Platelet Count: 201 10*3/uL (ref 150–400)
RBC: 4.7 MIL/uL (ref 4.22–5.81)
RDW: 12.4 % (ref 11.5–15.5)
WBC Count: 8.7 10*3/uL (ref 4.0–10.5)
nRBC: 0 % (ref 0.0–0.2)

## 2021-01-10 LAB — CMP (CANCER CENTER ONLY)
ALT: 22 U/L (ref 0–44)
AST: 14 U/L — ABNORMAL LOW (ref 15–41)
Albumin: 4.3 g/dL (ref 3.5–5.0)
Alkaline Phosphatase: 56 U/L (ref 38–126)
Anion gap: 6 (ref 5–15)
BUN: 16 mg/dL (ref 8–23)
CO2: 30 mmol/L (ref 22–32)
Calcium: 9.3 mg/dL (ref 8.9–10.3)
Chloride: 101 mmol/L (ref 98–111)
Creatinine: 1.26 mg/dL — ABNORMAL HIGH (ref 0.61–1.24)
GFR, Estimated: >60 mL/min (ref >60)
Glucose, Bld: 98 mg/dL (ref 70–99)
Potassium: 4.2 mmol/L (ref 3.5–5.1)
Sodium: 137 mmol/L (ref 135–145)
Total Bilirubin: 0.7 mg/dL (ref 0.3–1.2)
Total Protein: 6.7 g/dL (ref 6.5–8.1)

## 2021-01-10 LAB — TSH: TSH: 1.194 u[IU]/mL (ref 0.320–4.118)

## 2021-01-10 MED ORDER — SODIUM CHLORIDE 0.9 % IV SOLN
350.0000 mg | Freq: Once | INTRAVENOUS | Status: AC
  Administered 2021-01-10: 11:00:00 350 mg via INTRAVENOUS
  Filled 2021-01-10: qty 7

## 2021-01-10 MED ORDER — SODIUM CHLORIDE 0.9% FLUSH
10.0000 mL | INTRAVENOUS | Status: DC | PRN
  Administered 2021-01-10: 10:00:00 10 mL

## 2021-01-10 MED ORDER — SODIUM CHLORIDE 0.9 % IV SOLN
Freq: Once | INTRAVENOUS | Status: AC
Start: 2021-01-10 — End: 2021-01-10

## 2021-01-10 NOTE — Patient Instructions (Signed)
[facility]   Discharge Instructions: Thank you for choosing [facility] to provide your oncology and hematology care.   If you have a lab appointment with the [facility], please go directly to the [facility] and check in at the registration area.   Wear comfortable clothing and clothing appropriate for easy access to any Portacath or PICC line.   We strive to give you quality time with your provider. You may need to reschedule your appointment if you arrive late (15 or more minutes).  Arriving late affects you and other patients whose appointments are after yours.  Also, if you miss three or more appointments without notifying the office, you may be dismissed from the clinic at the providers discretion.      For prescription refill requests, have your pharmacy contact our office and allow 72 hours for refills to be completed.    Today you received the following chemotherapy and/or immunotherapy agents: cemiplimab      To help prevent nausea and vomiting after your treatment, we encourage you to take your nausea medication as directed.  BELOW ARE SYMPTOMS THAT SHOULD BE REPORTED IMMEDIATELY: *FEVER GREATER THAN 100.4 F (38 C) OR HIGHER *CHILLS OR SWEATING *NAUSEA AND VOMITING THAT IS NOT CONTROLLED WITH YOUR NAUSEA MEDICATION *UNUSUAL SHORTNESS OF BREATH *UNUSUAL BRUISING OR BLEEDING *URINARY PROBLEMS (pain or burning when urinating, or frequent urination) *BOWEL PROBLEMS (unusual diarrhea, constipation, pain near the anus) TENDERNESS IN MOUTH AND THROAT WITH OR WITHOUT PRESENCE OF ULCERS (sore throat, sores in mouth, or a toothache) UNUSUAL RASH, SWELLING OR PAIN  UNUSUAL VAGINAL DISCHARGE OR ITCHING   Items with * indicate a potential emergency and should be followed up as soon as possible or go to the Emergency Department if any problems should occur.  Please show the CHEMOTHERAPY ALERT CARD or IMMUNOTHERAPY ALERT CARD at check-in  to the Emergency Department and triage nurse.  Should you have questions after your visit or need to cancel or reschedule your appointment, please contact [facility]  Dept: [phone]  and follow the prompts.  Office hours are 8:00 a.m. to 4:30 p.m. Monday - Friday. Please note that voicemails left after 4:00 p.m. may not be returned until the following business day.  We are closed weekends and major holidays. You have access to a nurse at all times for urgent questions. Please call the main number to the clinic Dept: [phone] and follow the prompts.   For any non-urgent questions, you may also contact your provider using MyChart. We now offer e-Visits for anyone 47 and older to request care online for non-urgent symptoms. For details visit [email or web address].   Also download the MyChart app! Go to the app store, search "MyChart", open the app, select [facility], and log in with your MyChart username and password.  Due to Covid, a mask is required upon entering the hospital/clinic. If you do not have a mask, one will be given to you upon arrival. For doctor visits, patients may have 1 support person aged 67 or older with them. For treatment visits, patients cannot have anyone with them due to current Covid guidelines and our immunocompromised population.

## 2021-01-14 DIAGNOSIS — Z20822 Contact with and (suspected) exposure to covid-19: Secondary | ICD-10-CM | POA: Diagnosis not present

## 2021-01-29 ENCOUNTER — Other Ambulatory Visit: Payer: Self-pay | Admitting: *Deleted

## 2021-01-29 DIAGNOSIS — Z5112 Encounter for antineoplastic immunotherapy: Secondary | ICD-10-CM

## 2021-01-29 DIAGNOSIS — C3491 Malignant neoplasm of unspecified part of right bronchus or lung: Secondary | ICD-10-CM

## 2021-01-29 DIAGNOSIS — Z5111 Encounter for antineoplastic chemotherapy: Secondary | ICD-10-CM

## 2021-01-29 DIAGNOSIS — R5382 Chronic fatigue, unspecified: Secondary | ICD-10-CM

## 2021-01-31 ENCOUNTER — Inpatient Hospital Stay: Payer: Medicare Other

## 2021-01-31 ENCOUNTER — Inpatient Hospital Stay: Payer: Medicare Other | Attending: Internal Medicine | Admitting: Internal Medicine

## 2021-01-31 ENCOUNTER — Other Ambulatory Visit: Payer: Self-pay

## 2021-01-31 VITALS — BP 120/76 | HR 82 | Temp 97.4°F | Resp 18 | Ht 66.0 in | Wt 150.0 lb

## 2021-01-31 DIAGNOSIS — C3491 Malignant neoplasm of unspecified part of right bronchus or lung: Secondary | ICD-10-CM

## 2021-01-31 DIAGNOSIS — C3411 Malignant neoplasm of upper lobe, right bronchus or lung: Secondary | ICD-10-CM

## 2021-01-31 DIAGNOSIS — R197 Diarrhea, unspecified: Secondary | ICD-10-CM | POA: Diagnosis not present

## 2021-01-31 DIAGNOSIS — J449 Chronic obstructive pulmonary disease, unspecified: Secondary | ICD-10-CM | POA: Diagnosis not present

## 2021-01-31 DIAGNOSIS — N486 Induration penis plastica: Secondary | ICD-10-CM | POA: Diagnosis not present

## 2021-01-31 DIAGNOSIS — Z902 Acquired absence of lung [part of]: Secondary | ICD-10-CM | POA: Insufficient documentation

## 2021-01-31 DIAGNOSIS — R5382 Chronic fatigue, unspecified: Secondary | ICD-10-CM

## 2021-01-31 DIAGNOSIS — G2581 Restless legs syndrome: Secondary | ICD-10-CM | POA: Insufficient documentation

## 2021-01-31 DIAGNOSIS — I10 Essential (primary) hypertension: Secondary | ICD-10-CM | POA: Diagnosis not present

## 2021-01-31 DIAGNOSIS — R110 Nausea: Secondary | ICD-10-CM | POA: Insufficient documentation

## 2021-01-31 DIAGNOSIS — Z5112 Encounter for antineoplastic immunotherapy: Secondary | ICD-10-CM | POA: Diagnosis not present

## 2021-01-31 DIAGNOSIS — Z79899 Other long term (current) drug therapy: Secondary | ICD-10-CM | POA: Insufficient documentation

## 2021-01-31 DIAGNOSIS — K219 Gastro-esophageal reflux disease without esophagitis: Secondary | ICD-10-CM | POA: Insufficient documentation

## 2021-01-31 DIAGNOSIS — C782 Secondary malignant neoplasm of pleura: Secondary | ICD-10-CM | POA: Insufficient documentation

## 2021-01-31 DIAGNOSIS — Z5111 Encounter for antineoplastic chemotherapy: Secondary | ICD-10-CM

## 2021-01-31 LAB — CMP (CANCER CENTER ONLY)
ALT: 18 U/L (ref 0–44)
AST: 17 U/L (ref 15–41)
Albumin: 4.5 g/dL (ref 3.5–5.0)
Alkaline Phosphatase: 53 U/L (ref 38–126)
Anion gap: 5 (ref 5–15)
BUN: 17 mg/dL (ref 8–23)
CO2: 30 mmol/L (ref 22–32)
Calcium: 9.6 mg/dL (ref 8.9–10.3)
Chloride: 101 mmol/L (ref 98–111)
Creatinine: 1.29 mg/dL — ABNORMAL HIGH (ref 0.61–1.24)
GFR, Estimated: >60 mL/min (ref >60)
Glucose, Bld: 95 mg/dL (ref 70–99)
Potassium: 4.1 mmol/L (ref 3.5–5.1)
Sodium: 136 mmol/L (ref 135–145)
Total Bilirubin: 0.7 mg/dL (ref 0.3–1.2)
Total Protein: 7 g/dL (ref 6.5–8.1)

## 2021-01-31 LAB — CBC WITH DIFFERENTIAL (CANCER CENTER ONLY)
Abs Immature Granulocytes: 0.05 10*3/uL (ref 0.00–0.07)
Basophils Absolute: 0.1 10*3/uL (ref 0.0–0.1)
Basophils Relative: 1 %
Eosinophils Absolute: 0.5 10*3/uL (ref 0.0–0.5)
Eosinophils Relative: 5 %
HCT: 44.2 % (ref 39.0–52.0)
Hemoglobin: 15.7 g/dL (ref 13.0–17.0)
Immature Granulocytes: 1 %
Lymphocytes Relative: 20 %
Lymphs Abs: 1.9 10*3/uL (ref 0.7–4.0)
MCH: 31.3 pg (ref 26.0–34.0)
MCHC: 35.5 g/dL (ref 30.0–36.0)
MCV: 88 fL (ref 80.0–100.0)
Monocytes Absolute: 0.7 10*3/uL (ref 0.1–1.0)
Monocytes Relative: 7 %
Neutro Abs: 6.1 10*3/uL (ref 1.7–7.7)
Neutrophils Relative %: 66 %
Platelet Count: 195 10*3/uL (ref 150–400)
RBC: 5.02 MIL/uL (ref 4.22–5.81)
RDW: 12.3 % (ref 11.5–15.5)
WBC Count: 9.3 10*3/uL (ref 4.0–10.5)
nRBC: 0 % (ref 0.0–0.2)

## 2021-01-31 LAB — TSH: TSH: 1.84 u[IU]/mL (ref 0.320–4.118)

## 2021-01-31 MED ORDER — SODIUM CHLORIDE 0.9 % IV SOLN
350.0000 mg | Freq: Once | INTRAVENOUS | Status: AC
  Administered 2021-01-31: 350 mg via INTRAVENOUS
  Filled 2021-01-31: qty 7

## 2021-01-31 MED ORDER — SODIUM CHLORIDE 0.9% FLUSH
10.0000 mL | INTRAVENOUS | Status: DC | PRN
  Administered 2021-01-31: 10 mL

## 2021-01-31 MED ORDER — HEPARIN SOD (PORK) LOCK FLUSH 100 UNIT/ML IV SOLN
500.0000 [IU] | Freq: Once | INTRAVENOUS | Status: AC | PRN
  Administered 2021-01-31: 500 [IU]

## 2021-01-31 MED ORDER — SODIUM CHLORIDE 0.9 % IV SOLN: Freq: Once | INTRAVENOUS | Status: AC

## 2021-01-31 NOTE — Progress Notes (Signed)
[facility] [phone]   [phone]  OFFICE PROGRESS NOTE  [name], MD [address] [place] [address]  DIAGNOSIS: Metastatic non-small cell lung cancer initially diagnosed as stage IIB (T1b, N1, M0) non-small cell lung cancer, invasive poorly differentiated carcinoma diagnosed in June 2021 with disease recurrence in November 2021 Molecular studies are negative for EGFR mutation as well as ALK gene translocation.  PD-L1 expression was 50-100%.  This is based on the report from the [employer] trial. Molecular studies by [employer]: Showed no actionable mutations and PD-L1 expression was 90%  PRIOR THERAPY:  1) Status post right upper lobectomy with lymph node dissection on June 17, 2019 under the care of Dr. Roxan Hockey. 2) Adjuvant systemic chemotherapy with cisplatin 75 mg/M2 and Alimta 500 mg/M2 every 3 weeks.  First dose September 02, 2019. Status post 3 cycles.  His treatment was discontinued secondary to intolerance.  CURRENT THERAPY: First-line treatment with immunotherapy with Libtayo (Cempilimab) 350 mg IV every 3 weeks status post 17 cycles.  INTERVAL HISTORY: Luis [name] 71 y.o. male returns to the clinic today for follow-up visit.  The patient is feeling fine today with no concerning complaints except for some ear ache and flulike symptoms that started 2 days ago but he is feeling much better.  He had few episodes of nausea and diarrhea which is resolving.  He denied having any current chest pain, shortness of breath, but has mild cough with no hemoptysis.  He has no current fever or chills.  He has no significant weight loss or night sweats.  The patient continues to tolerate his treatment with Libtayo (Cempilimab) fairly well.  Is here today for evaluation before starting cycle #18.  MEDICAL HISTORY: Past Medical History:  Diagnosis Date   Arthritis    through out body   Chronic bronchitis (HCC)    Concussion     COPD (chronic obstructive pulmonary disease) (HCC)    Dyspnea    Emphysema lung (HCC)    Fatigue    Frozen shoulder    LEFT   GERD (gastroesophageal reflux disease)    Hard of hearing    Headache    alot of days, related to spine issues   Hypertension    Lumbar radiculopathy 2013   nscl ca 04/2019   Peyronie's disease    Prostatitis 2011   Dr. Gaynelle Arabian   RLS (restless legs syndrome)    Shingles 06/13/2018   shingles on back, finished with prednisone, stills has scabs and pain   Thigh pain    Bilateral Thigh   Thigh pain 10/2011   bilateral   Tobacco abuse 2010   still uses electronic cig/ emphysema, SOB easily   Vertigo    per wifes update   Wears partial dentures    upper    ALLERGIES:  is allergic to ativan [lorazepam], doxycycline, and tramadol.  MEDICATIONS:  Current Outpatient Medications  Medication Sig Dispense Refill   acetaminophen (TYLENOL) 500 MG tablet Take 1,000 mg by mouth at bedtime as needed for moderate pain.     Cholecalciferol (VITAMIN D) 125 MCG (5000 UT) CAPS Take 5,000 Units by mouth 3 (three) times a week.     citalopram (CELEXA) 20 MG tablet Take 1 tablet by mouth daily.     Fluticasone-Salmeterol (ADVAIR) 100-50 MCG/DOSE AEPB Inhale 2 puffs into the lungs 2 (two) times daily.     lidocaine-prilocaine (EMLA) cream Apply 1 application topically as needed.  Apply 1 tsp over port site at least 45 minutes prior to lab appointment.Do not rub in cream. Cover with plastic wrap. 30 g 0   Multiple Vitamin (MULTIVITAMIN) tablet Take 1 tablet by mouth daily.     valsartan-hydrochlorothiazide (DIOVAN-HCT) 80-12.5 MG tablet Take 1 tablet by mouth daily.     vitamin B-12 (CYANOCOBALAMIN) 500 MCG tablet 1 tablet     No current facility-administered medications for this visit.    SURGICAL HISTORY:  Past Surgical History:  Procedure Laterality Date   CATARACT EXTRACTION W/PHACO Right 07/08/2018   Procedure: CATARACT EXTRACTION PHACO AND INTRAOCULAR LENS  PLACEMENT ([facility]) RIGHT;  Surgeon: Leandrew Koyanagi, MD;  Location: [facility];  Service: Ophthalmology;  Laterality: Right;   CATARACT EXTRACTION W/PHACO Left 07/29/2018   Procedure: CATARACT EXTRACTION PHACO AND INTRAOCULAR LENS PLACEMENT ([facility]) LEFT TORIC LENS;  Surgeon: Leandrew Koyanagi, MD;  Location: [facility];  Service: Ophthalmology;  Laterality: Left;   CHEST TUBE INSERTION Right 07/01/2019   Procedure: CHEST TUBE INSERTION;  Surgeon: [name], MD;  Location: [facility];  Service: Thoracic;  Laterality: Right;   COLONOSCOPY     EYE SURGERY     INTERCOSTAL NERVE BLOCK Right 06/17/2019   Procedure: INTERCOSTAL NERVE BLOCK;  Surgeon: [name], MD;  Location: [facility];  Service: Thoracic;  Laterality: Right;   INTERCOSTAL NERVE BLOCK Right 07/01/2019   Procedure: INTERCOSTAL NERVE BLOCK;  Surgeon: [name], MD;  Location: [facility];  Service: Thoracic;  Laterality: Right;   IR IMAGING GUIDED PORT INSERTION  02/21/2020   TONSILLECTOMY     VIDEO ASSISTED THORACOSCOPY Right 07/01/2019   Procedure: VIDEO ASSISTED THORACOSCOPY - REMOVAL OF RIGHT MIDDLE LOBE BLEBS;  Surgeon: [name], MD;  Location: [facility];  Service: Thoracic;  Laterality: Right;   VIDEO BRONCHOSCOPY WITH INSERTION OF INTERBRONCHIAL VALVE (IBV) Right 06/25/2019   Procedure: VIDEO BRONCHOSCOPY WITH INSERTION OF INTERBRONCHIAL VALVE (IBV); Size 7 IBV into Right Loer Lobe (RLL) Superior Segment, Size 9 IBV into RLL Basilar Segment;  Surgeon: [name], MD;  Location: [facility];  Service: Thoracic;  Laterality: Right;   VIDEO BRONCHOSCOPY WITH INSERTION OF INTERBRONCHIAL VALVE (IBV) N/A 08/06/2019   Procedure: VIDEO BRONCHOSCOPY WITH REMOVAL OF INTERBRONCHIAL VALVE (IBV);  Surgeon: [name], MD;  Location[facility];  Service: Thoracic;  Laterality: N/A;    REVIEW OF SYSTEMS:  A comprehensive review of systems was negative except for: Constitutional: positive for  fatigue Respiratory: positive for cough   PHYSICAL EXAMINATION: General appearance: alert, cooperative, fatigued, and no distress Head: Normocephalic, without obvious abnormality, atraumatic Neck: no adenopathy, no JVD, supple, symmetrical, trachea midline, and thyroid not enlarged, symmetric, no tenderness/mass/nodules Lymph nodes: Cervical, supraclavicular, and axillary nodes normal. Resp: clear to auscultation bilaterally Back: symmetric, no curvature. ROM normal. No CVA tenderness. Cardio: regular rate and rhythm, S1, S2 normal, no murmur, click, rub or gallop GI: soft, non-tender; bowel sounds normal; no masses,  no organomegaly Extremities: extremities normal, atraumatic, no cyanosis or edema  ECOG PERFORMANCE STATUS: 0 - Asymptomatic  Blood pressure 120/76, pulse 82, temperature (!) 97.4 F (36.3 C), temperature source Tympanic, resp. rate 18, height 5' 6"  (1.676 m), weight 150 lb (68 kg), SpO2 98 %.  LABORATORY DATA: Lab Results  Component Value Date   WBC 9.3 01/31/2021   HGB 15.7 01/31/2021   HCT 44.2 01/31/2021   MCV 88.0 01/31/2021   PLT 195 01/31/2021      Chemistry  Component Value Date/Time   NA 137 01/10/2021 0945   K 4.2 01/10/2021 0945   CL 101 01/10/2021 0945   CO2 30 01/10/2021 0945   BUN 16 01/10/2021 0945   CREATININE 1.26 (H) 01/10/2021 0945      Component Value Date/Time   CALCIUM 9.3 01/10/2021 0945   ALKPHOS 56 01/10/2021 0945   AST 14 (L) 01/10/2021 0945   ALT 22 01/10/2021 0945   BILITOT 0.7 01/10/2021 0945       RADIOGRAPHIC STUDIES: No results found.  ASSESSMENT AND PLAN: This is a very pleasant 71 years old white male recently diagnosed with stage IIb (T1b, N1, M0) non-small cell lung cancer, adenocarcinoma status post right upper lobectomy with lymph node dissection under the care of Dr. Roxan Hockey on June 17, 2019. The patient had molecular studies on the [facility] clinical trial that showed negative for EGFR mutation as well as  ALK gene translocation but PD-L1 expression in the range of 50-100%. The patient underwent standard adjuvant systemic chemotherapy with cisplatin 75 mg/M2 and Alimta 500 mg/M2 every 3 weeks status post 3 cycles.  His treatment was discontinued secondary to intolerance. Unfortunately repeat CT scan as well as PET scan after completion of the adjuvant chemotherapy showed concerning findings for disease recurrence with small right pleural effusion as well as multiple pleural implants consistent with metastatic disease which was confirmed with repeat biopsy. He had molecular studies by [employer] and the blood test showed no actionable mutation.  His PD-L1 expression was 90%. The patient is currently undergoing treatment with immunotherapy with Libtayo (Cempilimab) 350 mg IV every 3 weeks status post 17 cycles. The patient continues to tolerate his treatment with Libtayo (Cempilimab) fairly well with no concerning adverse effects. I recommended for him to proceed with cycle #18 today as planned. I will see him back for follow-up visit in 3 weeks for evaluation before the next cycle of his treatment. The patient was advised to call immediately if he has any concerning symptoms in the interval.  The patient voices understanding of current disease status and treatment options and is in agreement with the current care plan.  All questions were answered. The patient knows to call the clinic with any problems, questions or concerns. We can certainly see the patient much sooner if necessary.  Disclaimer: This note was dictated with voice recognition software. Similar sounding words can inadvertently be transcribed and may not be corrected upon review.

## 2021-01-31 NOTE — Patient Instructions (Signed)
[facility]   Discharge Instructions: Thank you for choosing [facility] to provide your oncology and hematology care.   If you have a lab appointment with the [facility], please go directly to the [facility] and check in at the registration area.   Wear comfortable clothing and clothing appropriate for easy access to any Portacath or PICC line.   We strive to give you quality time with your provider. You may need to reschedule your appointment if you arrive late (15 or more minutes).  Arriving late affects you and other patients whose appointments are after yours.  Also, if you miss three or more appointments without notifying the office, you may be dismissed from the clinic at the providers discretion.      For prescription refill requests, have your pharmacy contact our office and allow 72 hours for refills to be completed.    Today you received the following chemotherapy and/or immunotherapy agents: [id number]      To help prevent nausea and vomiting after your treatment, we encourage you to take your nausea medication as directed.  BELOW ARE SYMPTOMS THAT SHOULD BE REPORTED IMMEDIATELY: *FEVER GREATER THAN 100.4 F (38 C) OR HIGHER *CHILLS OR SWEATING *NAUSEA AND VOMITING THAT IS NOT CONTROLLED WITH YOUR NAUSEA MEDICATION *UNUSUAL SHORTNESS OF BREATH *UNUSUAL BRUISING OR BLEEDING *URINARY PROBLEMS (pain or burning when urinating, or frequent urination) *BOWEL PROBLEMS (unusual diarrhea, constipation, pain near the anus) TENDERNESS IN MOUTH AND THROAT WITH OR WITHOUT PRESENCE OF ULCERS (sore throat, sores in mouth, or a toothache) UNUSUAL RASH, SWELLING OR PAIN  UNUSUAL VAGINAL DISCHARGE OR ITCHING   Items with * indicate a potential emergency and should be followed up as soon as possible or go to the Emergency Department if any problems should occur.  Please show the CHEMOTHERAPY ALERT CARD or IMMUNOTHERAPY ALERT CARD at  check-in to the Emergency Department and triage nurse.  Should you have questions after your visit or need to cancel or reschedule your appointment, please contact [facility]  Dept: [phone]  and follow the prompts.  Office hours are 8:00 a.m. to 4:30 p.m. Monday - Friday. Please note that voicemails left after 4:00 p.m. may not be returned until the following business day.  We are closed weekends and major holidays. You have access to a nurse at all times for urgent questions. Please call the main number to the clinic Dept: [phone] and follow the prompts.   For any non-urgent questions, you may also contact your provider using MyChart. We now offer e-Visits for anyone 25 and older to request care online for non-urgent symptoms. For details visit [email or web address].   Also download the MyChart app! Go to the app store, search "MyChart", open the app, select , and log in with your MyChart username and password.  Due to Covid, a mask is required upon entering the hospital/clinic. If you do not have a mask, one will be given to you upon arrival. For doctor visits, patients may have 1 support person aged 36 or older with them. For treatment visits, patients cannot have anyone with them due to current Covid guidelines and our immunocompromised population.

## 2021-02-08 NOTE — Progress Notes (Signed)
[facility] OFFICE PROGRESS NOTE  [name], MD [address]  [place] [address]  DIAGNOSIS: Metastatic non-small cell lung cancer initially diagnosed as stage IIB (T1b, N1, M0) non-small cell lung cancer, invasive poorly differentiated carcinoma diagnosed in June 2021 with disease recurrence in November 2021 Molecular studies are negative for EGFR mutation as well as ALK gene translocation.  PD-L1 expression was 50-100%.  This is based on the report from the [facility] trial. Molecular studies by [facility]: Showed no actionable mutations and PD-L1 expression was 90%  PRIOR THERAPY: 1) Status post right upper lobectomy with lymph node dissection on June 17, 2019 under the care of Dr. Roxan Hockey. 2) Adjuvant systemic chemotherapy with cisplatin 75 mg/M2 and Alimta 500 mg/M2 every 3 weeks.  First dose September 02, 2019. Status post 3 cycles.  His treatment was discontinued secondary to intolerance.  CURRENT THERAPY: First-line treatment with immunotherapy with Libtayo (Cempilimab) 350 mg IV every 3 weeks status post 18 cycles. First dose on 01/24/20.  INTERVAL HISTORY: Luis [name] 71 y.o. male returns to the clinic today for a follow up visit. The patient is feeling well today without any concerning complaints. He states that "everything is the same" today. Denies any changes in his health in the interval since his last appointment. The patient continues to tolerate treatment with immunotherapy well he sometimes has mild systemic itching without rash. He uses hydrocortisone cream if needed for itching. He does report 2-3 days of diarrhea following his treatments, which is not unusual for him. Then his diarrhea will resolve completely. For these days he reported about 3 episodes of diarrhea per day.  He states he has been prone to having diarrhea "all his life". He denies any blood in his stools or abdominal pain. He denies hemoptysis. Denies weight loss.  He  denies fever or chills.  He reports his baseline dyspnea on exertion due to COPD, as well as baseline chest pain along his lower right chest wall from his prior surgery. He denies a cough recently. He denies any nausea, vomiting, or constipation. He denies any visual changes.  He states that he has daily headaches in the occipital region which has been occurring his entire life which she states is secondary to a disc in his neck.  The patient is here today for evaluation before starting cycle #19.   MEDICAL HISTORY: Past Medical History:  Diagnosis Date   Arthritis    through out body   Chronic bronchitis (HCC)    Concussion    COPD (chronic obstructive pulmonary disease) (HCC)    Dyspnea    Emphysema lung (HCC)    Fatigue    Frozen shoulder    LEFT   GERD (gastroesophageal reflux disease)    Hard of hearing    Headache    alot of days, related to spine issues   Hypertension    Lumbar radiculopathy 2013   nscl ca 04/2019   Peyronie's disease    Prostatitis 2011   Dr. Gaynelle Arabian   RLS (restless legs syndrome)    Shingles 06/13/2018   shingles on back, finished with prednisone, stills has scabs and pain   Thigh pain    Bilateral Thigh   Thigh pain 10/2011   bilateral   Tobacco abuse 2010   still uses electronic cig/ emphysema, SOB easily   Vertigo    per wifes update   Wears partial dentures    upper    ALLERGIES:  is allergic  to ativan [lorazepam], doxycycline, and tramadol.  MEDICATIONS:  Current Outpatient Medications  Medication Sig Dispense Refill   acetaminophen (TYLENOL) 500 MG tablet Take 1,000 mg by mouth at bedtime as needed for moderate pain.     Cholecalciferol (VITAMIN D) 125 MCG (5000 UT) CAPS Take 5,000 Units by mouth 3 (three) times a week.     citalopram (CELEXA) 20 MG tablet Take 1 tablet by mouth daily.     Fluticasone-Salmeterol (ADVAIR) 100-50 MCG/DOSE AEPB Inhale 2 puffs into the lungs 2 (two) times daily.     lidocaine-prilocaine (EMLA) cream  Apply 1 application topically as needed. Apply 1 tsp over port site at least 45 minutes prior to lab appointment.Do not rub in cream. Cover with plastic wrap. 30 g 0   Multiple Vitamin (MULTIVITAMIN) tablet Take 1 tablet by mouth daily.     valsartan-hydrochlorothiazide (DIOVAN-HCT) 80-12.5 MG tablet Take 1 tablet by mouth daily.     vitamin B-12 (CYANOCOBALAMIN) 500 MCG tablet 1 tablet     No current facility-administered medications for this visit.    SURGICAL HISTORY:  Past Surgical History:  Procedure Laterality Date   CATARACT EXTRACTION W/PHACO Right 07/08/2018   Procedure: CATARACT EXTRACTION PHACO AND INTRAOCULAR LENS PLACEMENT ([facility]) RIGHT;  Surgeon: Leandrew Koyanagi, MD;  Location: [facility];  Service: Ophthalmology;  Laterality: Right;   CATARACT EXTRACTION W/PHACO Left 07/29/2018   Procedure: CATARACT EXTRACTION PHACO AND INTRAOCULAR LENS PLACEMENT ([facility]) LEFT TORIC LENS;  Surgeon: Leandrew Koyanagi, MD;  Location: [facility];  Service: Ophthalmology;  Laterality: Left;   CHEST TUBE INSERTION Right 07/01/2019   Procedure: CHEST TUBE INSERTION;  Surgeon: [name], MD;  Location: [facility];  Service: Thoracic;  Laterality: Right;   COLONOSCOPY     EYE SURGERY     INTERCOSTAL NERVE BLOCK Right 06/17/2019   Procedure: INTERCOSTAL NERVE BLOCK;  Surgeon: [name], MD;  Location: [facility];  Service: Thoracic;  Laterality: Right;   INTERCOSTAL NERVE BLOCK Right 07/01/2019   Procedure: INTERCOSTAL NERVE BLOCK;  Surgeon: [name], MD;  Location: [facility];  Service: Thoracic;  Laterality: Right;   IR IMAGING GUIDED PORT INSERTION  02/21/2020   TONSILLECTOMY     VIDEO ASSISTED THORACOSCOPY Right 07/01/2019   Procedure: VIDEO ASSISTED THORACOSCOPY - REMOVAL OF RIGHT MIDDLE LOBE BLEBS;  Surgeon: [name], MD;  Location: [facility];  Service: Thoracic;  Laterality: Right;   VIDEO BRONCHOSCOPY WITH INSERTION OF INTERBRONCHIAL VALVE (IBV)  Right 06/25/2019   Procedure: VIDEO BRONCHOSCOPY WITH INSERTION OF INTERBRONCHIAL VALVE (IBV); Size 7 IBV into Right Loer Lobe (RLL) Superior Segment, Size 9 IBV into RLL Basilar Segment;  Surgeon: [name], MD;  Location: [facility];  Service: Thoracic;  Laterality: Right;   VIDEO BRONCHOSCOPY WITH INSERTION OF INTERBRONCHIAL VALVE (IBV) N/A 08/06/2019   Procedure: VIDEO BRONCHOSCOPY WITH REMOVAL OF INTERBRONCHIAL VALVE (IBV);  Surgeon: [name], MD;  Location[facility];  Service: Thoracic;  Laterality: N/A;    REVIEW OF SYSTEMS:   Review of Systems  Constitutional: Negative for appetite change, chills, fatigue, fever and unexpected weight change.  HENT:   Negative for mouth sores, nosebleeds, sore throat and trouble swallowing.   Eyes: Negative for eye problems and icterus.  Respiratory: Positive for SOB with exertion (unchanged). Negative for hemoptysis, cough, and wheezing.   Cardiovascular: Positive for occasional pain along right lower chest wall (unchanged). Negative for chest tightness and leg swelling.  Gastrointestinal: Positive for diarrhea following treatment for ~2  days (unchanged). Negative for abdominal pain, constipation, nausea and vomiting.  Genitourinary: Negative for bladder incontinence, difficulty urinating, dysuria, frequency and hematuria.   Musculoskeletal: Negative for back pain, gait problem, neck pain and neck stiffness.  Skin: Negative for itching and rash.  Neurological: Positive for occasional occipital headache/neck discomfort.  Negative for dizziness, extremity weakness, gait problem, light-headedness and seizures.  Hematological: Negative for adenopathy. Does not bruise/bleed easily.  Psychiatric/Behavioral: Negative for confusion, depression and sleep disturbance. The patient is not nervous/anxious.     PHYSICAL EXAMINATION:  Blood pressure 140/78, pulse 65, temperature (!) 96.9 F (36.1 C), temperature source Tympanic, resp. rate 17, weight  151 lb 3.2 oz (68.6 kg), SpO2 100 %.  ECOG PERFORMANCE STATUS: 1  Physical Exam  Head: Normocephalic and atraumatic.  Mouth/Throat: Oropharynx is clear and moist. No oropharyngeal exudate.  Eyes: Conjunctivae are normal. Right eye exhibits no discharge. Left eye exhibits no discharge. No scleral icterus.  Neck: Normal range of motion. Neck supple.  Cardiovascular: Normal rate, regular rhythm, normal heart sounds and intact distal pulses.   Pulmonary/Chest: Effort normal. Quiet breath sounds bilaterally. No respiratory distress. No wheezes. No rales.  Abdominal: Soft. Bowel sounds are normal. Exhibits no distension and no mass. There is no tenderness.  Musculoskeletal: Normal range of motion. Exhibits no edema.  Lymphadenopathy:    No cervical adenopathy.  Neurological: Alert and oriented to person, place, and time. Exhibits normal muscle tone. Gait normal. Coordination normal.  Skin: Skin is warm and dry. No rash noted. Not diaphoretic. No erythema. No pallor.  Psychiatric: Mood, memory and judgment normal.  Vitals reviewed.  LABORATORY DATA: Lab Results  Component Value Date   WBC 7.9 02/21/2021   HGB 14.7 02/21/2021   HCT 41.9 02/21/2021   MCV 89.0 02/21/2021   PLT 198 02/21/2021      Chemistry      Component Value Date/Time   NA 136 01/31/2021 1028   K 4.1 01/31/2021 1028   CL 101 01/31/2021 1028   CO2 30 01/31/2021 1028   BUN 17 01/31/2021 1028   CREATININE 1.29 (H) 01/31/2021 1028      Component Value Date/Time   CALCIUM 9.6 01/31/2021 1028   ALKPHOS 53 01/31/2021 1028   AST 17 01/31/2021 1028   ALT 18 01/31/2021 1028   BILITOT 0.7 01/31/2021 1028       RADIOGRAPHIC STUDIES:  No results found.   ASSESSMENT/PLAN:  This is a very pleasant 71 year old Caucasian male with metastatic lung cancer initially diagnosed as stage IIb (T1b, N1, M0) non-small cell lung cancer, adenocarcinoma.  He is status post a right upper lobe lobectomy with lymph node dissection  under the care of Dr. Roxan Hockey.  This was performed on June 17, 2019.  He has no actionable mutations.  His PD-L1 expression is 90%. He had evidence for metastatic disease in November 2021 with small right pleural effusion as well as multiple right pleural implants.   The patient completed 3 of the 4 planned adjuvant chemotherapy cycles with cisplatin 75 mg per metered square and Alimta 500 mg/m. This was discontinued after cycle #3 due to intolerance.   When the patient was found to have evidence for disease recurrence, the patient was started on immunotherapy with Libtayo (Cempilimab) 350 mg IV every 3 weeks status post 18 cycles. He is tolerating this well.  Labs were reviewed. His CMP is pending. As long as his CMP is within parameters, recommend that he proceed  with cycle #19 today scheduled.  We will see him back for follow-up visit in 3 weeks for evaluation before starting cycle #20.  He will continue to use anti-histamines and hydrocortisone if needed for itching.   He knows to take imodium if needed for diarrhea.   The patient was advised to call immediately if he has any concerning symptoms in the interval. The patient voices understanding of current disease status and treatment options and is in agreement with the current care plan. All questions were answered. The patient knows to call the clinic with any problems, questions or concerns. We can certainly see the patient much sooner if necessary   Orders Placed This Encounter  Procedures   CBC with Differential ([facility] Only)    Standing Status:   Standing    Number of Occurrences:   16    Standing Expiration Date:   02/21/2022   CMP ([facility] only)    Standing Status:   Standing    Number of Occurrences:   16    Standing Expiration Date:   02/21/2022   TSH    Standing Status:   Standing    Number of Occurrences:   16    Standing Expiration Date:   02/21/2022     The total time spent in the appointment was  20-29 minutes.   Taylen Wendland L Dezirae Service, PA-C 02/21/21

## 2021-02-20 ENCOUNTER — Other Ambulatory Visit: Payer: Self-pay

## 2021-02-20 DIAGNOSIS — C3491 Malignant neoplasm of unspecified part of right bronchus or lung: Secondary | ICD-10-CM

## 2021-02-21 ENCOUNTER — Other Ambulatory Visit: Payer: Self-pay

## 2021-02-21 ENCOUNTER — Inpatient Hospital Stay: Payer: Medicare Other

## 2021-02-21 ENCOUNTER — Encounter: Payer: Self-pay | Admitting: Physician Assistant

## 2021-02-21 ENCOUNTER — Inpatient Hospital Stay (HOSPITAL_BASED_OUTPATIENT_CLINIC_OR_DEPARTMENT_OTHER): Payer: Medicare Other | Admitting: Physician Assistant

## 2021-02-21 ENCOUNTER — Inpatient Hospital Stay: Payer: Medicare Other | Attending: Internal Medicine

## 2021-02-21 VITALS — BP 140/78 | HR 65 | Temp 96.9°F | Resp 17 | Wt 151.2 lb

## 2021-02-21 DIAGNOSIS — C3491 Malignant neoplasm of unspecified part of right bronchus or lung: Secondary | ICD-10-CM | POA: Diagnosis not present

## 2021-02-21 DIAGNOSIS — I10 Essential (primary) hypertension: Secondary | ICD-10-CM | POA: Diagnosis not present

## 2021-02-21 DIAGNOSIS — L299 Pruritus, unspecified: Secondary | ICD-10-CM | POA: Diagnosis not present

## 2021-02-21 DIAGNOSIS — Z79899 Other long term (current) drug therapy: Secondary | ICD-10-CM | POA: Diagnosis not present

## 2021-02-21 DIAGNOSIS — R197 Diarrhea, unspecified: Secondary | ICD-10-CM | POA: Insufficient documentation

## 2021-02-21 DIAGNOSIS — Z5111 Encounter for antineoplastic chemotherapy: Secondary | ICD-10-CM

## 2021-02-21 DIAGNOSIS — C3411 Malignant neoplasm of upper lobe, right bronchus or lung: Secondary | ICD-10-CM | POA: Diagnosis not present

## 2021-02-21 DIAGNOSIS — Z5112 Encounter for antineoplastic immunotherapy: Secondary | ICD-10-CM | POA: Diagnosis not present

## 2021-02-21 DIAGNOSIS — R519 Headache, unspecified: Secondary | ICD-10-CM | POA: Insufficient documentation

## 2021-02-21 DIAGNOSIS — J449 Chronic obstructive pulmonary disease, unspecified: Secondary | ICD-10-CM | POA: Insufficient documentation

## 2021-02-21 DIAGNOSIS — R5383 Other fatigue: Secondary | ICD-10-CM

## 2021-02-21 DIAGNOSIS — Z902 Acquired absence of lung [part of]: Secondary | ICD-10-CM | POA: Diagnosis not present

## 2021-02-21 DIAGNOSIS — K219 Gastro-esophageal reflux disease without esophagitis: Secondary | ICD-10-CM | POA: Insufficient documentation

## 2021-02-21 LAB — CBC WITH DIFFERENTIAL (CANCER CENTER ONLY)
Abs Immature Granulocytes: 0.02 10*3/uL (ref 0.00–0.07)
Basophils Absolute: 0.1 10*3/uL (ref 0.0–0.1)
Basophils Relative: 1 %
Eosinophils Absolute: 0.4 10*3/uL (ref 0.0–0.5)
Eosinophils Relative: 5 %
HCT: 41.9 % (ref 39.0–52.0)
Hemoglobin: 14.7 g/dL (ref 13.0–17.0)
Immature Granulocytes: 0 %
Lymphocytes Relative: 22 %
Lymphs Abs: 1.7 10*3/uL (ref 0.7–4.0)
MCH: 31.2 pg (ref 26.0–34.0)
MCHC: 35.1 g/dL (ref 30.0–36.0)
MCV: 89 fL (ref 80.0–100.0)
Monocytes Absolute: 0.7 10*3/uL (ref 0.1–1.0)
Monocytes Relative: 9 %
Neutro Abs: 5 10*3/uL (ref 1.7–7.7)
Neutrophils Relative %: 63 %
Platelet Count: 198 10*3/uL (ref 150–400)
RBC: 4.71 MIL/uL (ref 4.22–5.81)
RDW: 12.6 % (ref 11.5–15.5)
WBC Count: 7.9 10*3/uL (ref 4.0–10.5)
nRBC: 0 % (ref 0.0–0.2)

## 2021-02-21 LAB — CMP (CANCER CENTER ONLY)
ALT: 16 U/L (ref 0–44)
AST: 15 U/L (ref 15–41)
Albumin: 4.4 g/dL (ref 3.5–5.0)
Alkaline Phosphatase: 45 U/L (ref 38–126)
Anion gap: 5 (ref 5–15)
BUN: 18 mg/dL (ref 8–23)
CO2: 30 mmol/L (ref 22–32)
Calcium: 9.4 mg/dL (ref 8.9–10.3)
Chloride: 102 mmol/L (ref 98–111)
Creatinine: 1.22 mg/dL (ref 0.61–1.24)
GFR, Estimated: >60 mL/min (ref >60)
Glucose, Bld: 81 mg/dL (ref 70–99)
Potassium: 4.2 mmol/L (ref 3.5–5.1)
Sodium: 137 mmol/L (ref 135–145)
Total Bilirubin: 0.7 mg/dL (ref 0.3–1.2)
Total Protein: 6.6 g/dL (ref 6.5–8.1)

## 2021-02-21 MED ORDER — HEPARIN SOD (PORK) LOCK FLUSH 100 UNIT/ML IV SOLN
500.0000 [IU] | Freq: Once | INTRAVENOUS | Status: AC | PRN
  Administered 2021-02-21: 500 [IU]

## 2021-02-21 MED ORDER — SODIUM CHLORIDE 0.9% FLUSH
10.0000 mL | INTRAVENOUS | Status: DC | PRN
  Administered 2021-02-21: 10 mL

## 2021-02-21 MED ORDER — SODIUM CHLORIDE 0.9 % IV SOLN: Freq: Once | INTRAVENOUS | Status: AC

## 2021-02-21 MED ORDER — SODIUM CHLORIDE 0.9 % IV SOLN
350.0000 mg | Freq: Once | INTRAVENOUS | Status: AC
  Administered 2021-02-21: 350 mg via INTRAVENOUS
  Filled 2021-02-21: qty 7

## 2021-02-21 NOTE — Patient Instructions (Addendum)
[facility]  Discharge Instructions: Thank you for choosing [facility] to provide your oncology and hematology care.   If you have a lab appointment with the [facility], please go directly to the [facility] and check in at the registration area.   Wear comfortable clothing and clothing appropriate for easy access to any Portacath or PICC line.   We strive to give you quality time with your provider. You may need to reschedule your appointment if you arrive late (15 or more minutes).  Arriving late affects you and other patients whose appointments are after yours.  Also, if you miss three or more appointments without notifying the office, you may be dismissed from the clinic at the providers discretion.      For prescription refill requests, have your pharmacy contact our office and allow 72 hours for refills to be completed.    Today you received the following chemotherapy and/or immunotherapy agents :  Libtayo      To help prevent nausea and vomiting after your treatment, we encourage you to take your nausea medication as directed.  BELOW ARE SYMPTOMS THAT SHOULD BE REPORTED IMMEDIATELY: *FEVER GREATER THAN 100.4 F (38 C) OR HIGHER *CHILLS OR SWEATING *NAUSEA AND VOMITING THAT IS NOT CONTROLLED WITH YOUR NAUSEA MEDICATION *UNUSUAL SHORTNESS OF BREATH *UNUSUAL BRUISING OR BLEEDING *URINARY PROBLEMS (pain or burning when urinating, or frequent urination) *BOWEL PROBLEMS (unusual diarrhea, constipation, pain near the anus) TENDERNESS IN MOUTH AND THROAT WITH OR WITHOUT PRESENCE OF ULCERS (sore throat, sores in mouth, or a toothache) UNUSUAL RASH, SWELLING OR PAIN  UNUSUAL VAGINAL DISCHARGE OR ITCHING   Items with * indicate a potential emergency and should be followed up as soon as possible or go to the Emergency Department if any problems should occur.  Please show the CHEMOTHERAPY ALERT CARD or IMMUNOTHERAPY ALERT CARD at check-in to  the Emergency Department and triage nurse.  Should you have questions after your visit or need to cancel or reschedule your appointment, please contact [facility]  Dept: [phone]  and follow the prompts.  Office hours are 8:00 a.m. to 4:30 p.m. Monday - Friday. Please note that voicemails left after 4:00 p.m. may not be returned until the following business day.  We are closed weekends and major holidays. You have access to a nurse at all times for urgent questions. Please call the main number to the clinic Dept: [phone] and follow the prompts.   For any non-urgent questions, you may also contact your provider using MyChart. We now offer e-Visits for anyone 77 and older to request care online for non-urgent symptoms. For details visit [email or web address].   Also download the MyChart app! Go to the app store, search "MyChart", open the app, select [facility], and log in with your MyChart username and password.  Due to Covid, a mask is required upon entering the hospital/clinic. If you do not have a mask, one will be given to you upon arrival. For doctor visits, patients may have 1 support person aged 70 or older with them. For treatment visits, patients cannot have anyone with them due to current Covid guidelines and our immunocompromised population.

## 2021-03-14 ENCOUNTER — Inpatient Hospital Stay (HOSPITAL_BASED_OUTPATIENT_CLINIC_OR_DEPARTMENT_OTHER): Payer: Medicare Other | Admitting: Internal Medicine

## 2021-03-14 ENCOUNTER — Inpatient Hospital Stay: Payer: Medicare Other | Attending: Internal Medicine

## 2021-03-14 ENCOUNTER — Encounter: Payer: Self-pay | Admitting: Internal Medicine

## 2021-03-14 ENCOUNTER — Other Ambulatory Visit: Payer: Self-pay | Admitting: Internal Medicine

## 2021-03-14 ENCOUNTER — Inpatient Hospital Stay: Payer: Medicare Other

## 2021-03-14 ENCOUNTER — Other Ambulatory Visit: Payer: Self-pay

## 2021-03-14 VITALS — BP 143/77 | HR 75 | Temp 97.2°F | Resp 19 | Ht 66.0 in | Wt 152.7 lb

## 2021-03-14 DIAGNOSIS — C3491 Malignant neoplasm of unspecified part of right bronchus or lung: Secondary | ICD-10-CM

## 2021-03-14 DIAGNOSIS — N486 Induration penis plastica: Secondary | ICD-10-CM | POA: Diagnosis not present

## 2021-03-14 DIAGNOSIS — I10 Essential (primary) hypertension: Secondary | ICD-10-CM | POA: Insufficient documentation

## 2021-03-14 DIAGNOSIS — Z5112 Encounter for antineoplastic immunotherapy: Secondary | ICD-10-CM

## 2021-03-14 DIAGNOSIS — J449 Chronic obstructive pulmonary disease, unspecified: Secondary | ICD-10-CM | POA: Insufficient documentation

## 2021-03-14 DIAGNOSIS — C3411 Malignant neoplasm of upper lobe, right bronchus or lung: Secondary | ICD-10-CM | POA: Diagnosis not present

## 2021-03-14 DIAGNOSIS — C782 Secondary malignant neoplasm of pleura: Secondary | ICD-10-CM | POA: Diagnosis not present

## 2021-03-14 DIAGNOSIS — Z79899 Other long term (current) drug therapy: Secondary | ICD-10-CM | POA: Insufficient documentation

## 2021-03-14 DIAGNOSIS — Z5111 Encounter for antineoplastic chemotherapy: Secondary | ICD-10-CM

## 2021-03-14 DIAGNOSIS — Z902 Acquired absence of lung [part of]: Secondary | ICD-10-CM | POA: Insufficient documentation

## 2021-03-14 DIAGNOSIS — K219 Gastro-esophageal reflux disease without esophagitis: Secondary | ICD-10-CM | POA: Diagnosis not present

## 2021-03-14 DIAGNOSIS — G2581 Restless legs syndrome: Secondary | ICD-10-CM | POA: Diagnosis not present

## 2021-03-14 DIAGNOSIS — R5383 Other fatigue: Secondary | ICD-10-CM

## 2021-03-14 DIAGNOSIS — C3490 Malignant neoplasm of unspecified part of unspecified bronchus or lung: Secondary | ICD-10-CM

## 2021-03-14 LAB — CMP (CANCER CENTER ONLY)
ALT: 14 U/L (ref 0–44)
AST: 15 U/L (ref 15–41)
Albumin: 4.4 g/dL (ref 3.5–5.0)
Alkaline Phosphatase: 51 U/L (ref 38–126)
Anion gap: 5 (ref 5–15)
BUN: 20 mg/dL (ref 8–23)
CO2: 29 mmol/L (ref 22–32)
Calcium: 9.4 mg/dL (ref 8.9–10.3)
Chloride: 103 mmol/L (ref 98–111)
Creatinine: 1.2 mg/dL (ref 0.61–1.24)
GFR, Estimated: >60 mL/min (ref >60)
Glucose, Bld: 117 mg/dL — ABNORMAL HIGH (ref 70–99)
Potassium: 4.2 mmol/L (ref 3.5–5.1)
Sodium: 137 mmol/L (ref 135–145)
Total Bilirubin: 0.7 mg/dL (ref 0.3–1.2)
Total Protein: 6.7 g/dL (ref 6.5–8.1)

## 2021-03-14 LAB — CBC WITH DIFFERENTIAL (CANCER CENTER ONLY)
Abs Immature Granulocytes: 0.03 10*3/uL (ref 0.00–0.07)
Basophils Absolute: 0.1 10*3/uL (ref 0.0–0.1)
Basophils Relative: 1 %
Eosinophils Absolute: 0.3 10*3/uL (ref 0.0–0.5)
Eosinophils Relative: 4 %
HCT: 42.5 % (ref 39.0–52.0)
Hemoglobin: 15.1 g/dL (ref 13.0–17.0)
Immature Granulocytes: 0 %
Lymphocytes Relative: 21 %
Lymphs Abs: 1.6 10*3/uL (ref 0.7–4.0)
MCH: 31.5 pg (ref 26.0–34.0)
MCHC: 35.5 g/dL (ref 30.0–36.0)
MCV: 88.5 fL (ref 80.0–100.0)
Monocytes Absolute: 0.5 10*3/uL (ref 0.1–1.0)
Monocytes Relative: 7 %
Neutro Abs: 4.9 10*3/uL (ref 1.7–7.7)
Neutrophils Relative %: 67 %
Platelet Count: 182 10*3/uL (ref 150–400)
RBC: 4.8 MIL/uL (ref 4.22–5.81)
RDW: 12.5 % (ref 11.5–15.5)
WBC Count: 7.4 10*3/uL (ref 4.0–10.5)
nRBC: 0 % (ref 0.0–0.2)

## 2021-03-14 LAB — TSH: TSH: 1.497 u[IU]/mL (ref 0.320–4.118)

## 2021-03-14 MED ORDER — SODIUM CHLORIDE 0.9% FLUSH
10.0000 mL | INTRAVENOUS | Status: DC | PRN
  Administered 2021-03-14: 10 mL

## 2021-03-14 MED ORDER — HEPARIN SOD (PORK) LOCK FLUSH 100 UNIT/ML IV SOLN
500.0000 [IU] | Freq: Once | INTRAVENOUS | Status: AC | PRN
  Administered 2021-03-14: 500 [IU]

## 2021-03-14 MED ORDER — SODIUM CHLORIDE 0.9 % IV SOLN: Freq: Once | INTRAVENOUS | Status: AC

## 2021-03-14 MED ORDER — SODIUM CHLORIDE 0.9 % IV SOLN
350.0000 mg | Freq: Once | INTRAVENOUS | Status: AC
  Administered 2021-03-14: 350 mg via INTRAVENOUS
  Filled 2021-03-14: qty 7

## 2021-03-14 NOTE — Patient Instructions (Signed)
[facility]  Discharge Instructions: ?Thank you for choosing [facility] to provide your oncology and hematology care.  ? ?If you have a lab appointment with the [facility], please go directly to the [facility] and check in at the registration area. ?  ?Wear comfortable clothing and clothing appropriate for easy access to any Portacath or PICC line.  ? ?We strive to give you quality time with your provider. You may need to reschedule your appointment if you arrive late (15 or more minutes).  Arriving late affects you and other patients whose appointments are after yours.  Also, if you miss three or more appointments without notifying the office, you may be dismissed from the clinic at the provider?s discretion.    ?  ?For prescription refill requests, have your pharmacy contact our office and allow 72 hours for refills to be completed.   ? ?Today you received the following chemotherapy and/or immunotherapy agents :  Libtayo    ?  ?To help prevent nausea and vomiting after your treatment, we encourage you to take your nausea medication as directed. ? ?BELOW ARE SYMPTOMS THAT SHOULD BE REPORTED IMMEDIATELY: ?*FEVER GREATER THAN 100.4 F (38 ?C) OR HIGHER ?*CHILLS OR SWEATING ?*NAUSEA AND VOMITING THAT IS NOT CONTROLLED WITH YOUR NAUSEA MEDICATION ?*UNUSUAL SHORTNESS OF BREATH ?*UNUSUAL BRUISING OR BLEEDING ?*URINARY PROBLEMS (pain or burning when urinating, or frequent urination) ?*BOWEL PROBLEMS (unusual diarrhea, constipation, pain near the anus) ?TENDERNESS IN MOUTH AND THROAT WITH OR WITHOUT PRESENCE OF ULCERS (sore throat, sores in mouth, or a toothache) ?UNUSUAL RASH, SWELLING OR PAIN  ?UNUSUAL VAGINAL DISCHARGE OR ITCHING  ? ?Items with * indicate a potential emergency and should be followed up as soon as possible or go to the Emergency Department if any problems should occur. ? ?Please show the CHEMOTHERAPY ALERT CARD or IMMUNOTHERAPY ALERT CARD at check-in to  the Emergency Department and triage nurse. ? ?Should you have questions after your visit or need to cancel or reschedule your appointment, please contact [facility]  Dept: [phone]  and follow the prompts.  Office hours are 8:00 a.m. to 4:30 p.m. Monday - Friday. Please note that voicemails left after 4:00 p.m. may not be returned until the following business day.  We are closed weekends and major holidays. You have access to a nurse at all times for urgent questions. Please call the main number to the clinic Dept: [phone] and follow the prompts. ? ? ?For any non-urgent questions, you may also contact your provider using MyChart. We now offer e-Visits for anyone 23 and older to request care online for non-urgent symptoms. For details visit [email or web address]. ?  ?Also download the MyChart app! Go to the app store, search "MyChart", open the app, select [facility], and log in with your MyChart username and password. ? ?Due to Covid, a mask is required upon entering the hospital/clinic. If you do not have a mask, one will be given to you upon arrival. For doctor visits, patients may have 1 support person aged 11 or older with them. For treatment visits, patients cannot have anyone with them due to current Covid guidelines and our immunocompromised population.  ? ?

## 2021-03-14 NOTE — Progress Notes (Signed)
[facility] [phone]   Fax:([phone]  OFFICE PROGRESS NOTE  [name], MD [address] [place] [address]  DIAGNOSIS: Metastatic non-small cell lung cancer initially diagnosed as stage IIB (T1b, N1, M0) non-small cell lung cancer, invasive poorly differentiated carcinoma diagnosed in June 2021 with disease recurrence in November 2021 Molecular studies are negative for EGFR mutation as well as ALK gene translocation.  PD-L1 expression was 50-100%.  This is based on the report from the [employer] trial. Molecular studies by [employer]: Showed no actionable mutations and PD-L1 expression was 90%  PRIOR THERAPY:  1) Status post right upper lobectomy with lymph node dissection on June 17, 2019 under the care of Dr. Roxan Hockey. 2) Adjuvant systemic chemotherapy with cisplatin 75 mg/M2 and Alimta 500 mg/M2 every 3 weeks.  First dose September 02, 2019. Status post 3 cycles.  His treatment was discontinued secondary to intolerance.  CURRENT THERAPY: First-line treatment with immunotherapy with Libtayo (Cempilimab) 350 mg IV every 3 weeks status post 19 cycles.  INTERVAL HISTORY: Luis [name] 71 y.o. male returns to the clinic today for follow-up visit.  The patient is feeling fine today with no concerning complaints.  He denied having any current chest pain, shortness of breath, cough or hemoptysis.  He has no nausea, vomiting, diarrhea or constipation.  He denied having any headache or visual changes.  He has no recent weight loss or night sweats.  He continues to tolerate his treatment with cemiplimab fairly well.  The patient is here today for evaluation before starting cycle #20 of his treatment.   MEDICAL HISTORY: Past Medical History:  Diagnosis Date   Arthritis    through out body   Chronic bronchitis (HCC)    Concussion    COPD (chronic obstructive pulmonary disease) (HCC)    Dyspnea    Emphysema lung (HCC)    Fatigue     Frozen shoulder    LEFT   GERD (gastroesophageal reflux disease)    Hard of hearing    Headache    alot of days, related to spine issues   Hypertension    Lumbar radiculopathy 2013   nscl ca 04/2019   Peyronie's disease    Prostatitis 2011   Dr. Gaynelle Arabian   RLS (restless legs syndrome)    Shingles 06/13/2018   shingles on back, finished with prednisone, stills has scabs and pain   Thigh pain    Bilateral Thigh   Thigh pain 10/2011   bilateral   Tobacco abuse 2010   still uses electronic cig/ emphysema, SOB easily   Vertigo    per wifes update   Wears partial dentures    upper    ALLERGIES:  is allergic to ativan [lorazepam], doxycycline, and tramadol.  MEDICATIONS:  Current Outpatient Medications  Medication Sig Dispense Refill   acetaminophen (TYLENOL) 500 MG tablet Take 1,000 mg by mouth at bedtime as needed for moderate pain.     Cholecalciferol (VITAMIN D) 125 MCG (5000 UT) CAPS Take 5,000 Units by mouth 3 (three) times a week.     citalopram (CELEXA) 20 MG tablet Take 1 tablet by mouth daily.     Fluticasone-Salmeterol (ADVAIR) 100-50 MCG/DOSE AEPB Inhale 2 puffs into the lungs 2 (two) times daily.     lidocaine-prilocaine (EMLA) cream Apply 1 application topically as needed. Apply 1 tsp over port site at least 45 minutes prior to lab appointment.Do not rub in cream. Cover with plastic  wrap. 30 g 0   Multiple Vitamin (MULTIVITAMIN) tablet Take 1 tablet by mouth daily.     valsartan-hydrochlorothiazide (DIOVAN-HCT) 80-12.5 MG tablet Take 1 tablet by mouth daily.     vitamin B-12 (CYANOCOBALAMIN) 500 MCG tablet 1 tablet     No current facility-administered medications for this visit.    SURGICAL HISTORY:  Past Surgical History:  Procedure Laterality Date   CATARACT EXTRACTION W/PHACO Right 07/08/2018   Procedure: CATARACT EXTRACTION PHACO AND INTRAOCULAR LENS PLACEMENT ([place]) RIGHT;  Surgeon: Leandrew Koyanagi, MD;  Location: [facility];  Service:  Ophthalmology;  Laterality: Right;   CATARACT EXTRACTION W/PHACO Left 07/29/2018   Procedure: CATARACT EXTRACTION PHACO AND INTRAOCULAR LENS PLACEMENT ([place]) LEFT TORIC LENS;  Surgeon: Leandrew Koyanagi, MD;  Location: [facility];  Service: Ophthalmology;  Laterality: Left;   CHEST TUBE INSERTION Right 07/01/2019   Procedure: CHEST TUBE INSERTION;  Surgeon: [name], MD;  Location: [facility];  Service: Thoracic;  Laterality: Right;   COLONOSCOPY     EYE SURGERY     INTERCOSTAL NERVE BLOCK Right 06/17/2019   Procedure: INTERCOSTAL NERVE BLOCK;  Surgeon: [name], MD;  Location: [facility];  Service: Thoracic;  Laterality: Right;   INTERCOSTAL NERVE BLOCK Right 07/01/2019   Procedure: INTERCOSTAL NERVE BLOCK;  Surgeon: [name], MD;  Location: [facility];  Service: Thoracic;  Laterality: Right;   IR IMAGING GUIDED PORT INSERTION  02/21/2020   TONSILLECTOMY     VIDEO ASSISTED THORACOSCOPY Right 07/01/2019   Procedure: VIDEO ASSISTED THORACOSCOPY - REMOVAL OF RIGHT MIDDLE LOBE BLEBS;  Surgeon: [name], MD;  Location: [facility];  Service: Thoracic;  Laterality: Right;   VIDEO BRONCHOSCOPY WITH INSERTION OF INTERBRONCHIAL VALVE (IBV) Right 06/25/2019   Procedure: VIDEO BRONCHOSCOPY WITH INSERTION OF INTERBRONCHIAL VALVE (IBV); Size 7 IBV into Right Loer Lobe (RLL) Superior Segment, Size 9 IBV into RLL Basilar Segment;  Surgeon: [name], MD;  Location[facility];  Service: Thoracic;  Laterality: Right;   VIDEO BRONCHOSCOPY WITH INSERTION OF INTERBRONCHIAL VALVE (IBV) N/A 08/06/2019   Procedure: VIDEO BRONCHOSCOPY WITH REMOVAL OF INTERBRONCHIAL VALVE (IBV);  Surgeon: [name], MD;  Location: [facility];  Service: Thoracic;  Laterality: N/A;    REVIEW OF SYSTEMS:  A comprehensive review of systems was negative.   PHYSICAL EXAMINATION: General appearance: alert, cooperative, and no distress Head: Normocephalic, without obvious abnormality,  atraumatic Neck: no adenopathy, no JVD, supple, symmetrical, trachea midline, and thyroid not enlarged, symmetric, no tenderness/mass/nodules Lymph nodes: Cervical, supraclavicular, and axillary nodes normal. Resp: clear to auscultation bilaterally Back: symmetric, no curvature. ROM normal. No CVA tenderness. Cardio: regular rate and rhythm, S1, S2 normal, no murmur, click, rub or gallop GI: soft, non-tender; bowel sounds normal; no masses,  no organomegaly Extremities: extremities normal, atraumatic, no cyanosis or edema  ECOG PERFORMANCE STATUS: 0 - Asymptomatic  Blood pressure (!) 143/77, pulse 75, temperature (!) 97.2 F (36.2 C), temperature source Tympanic, resp. rate 19, height 5' 6"  (1.676 m), weight 152 lb 11.2 oz (69.3 kg), SpO2 100 %.  LABORATORY DATA: Lab Results  Component Value Date   WBC 7.4 03/14/2021   HGB 15.1 03/14/2021   HCT 42.5 03/14/2021   MCV 88.5 03/14/2021   PLT 182 03/14/2021      Chemistry      Component Value Date/Time   NA 137 02/21/2021 0945   K 4.2 02/21/2021 0945   CL 102 02/21/2021 0945   CO2 30 02/21/2021 0945  BUN 18 02/21/2021 0945   CREATININE 1.22 02/21/2021 0945      Component Value Date/Time   CALCIUM 9.4 02/21/2021 0945   ALKPHOS 45 02/21/2021 0945   AST 15 02/21/2021 0945   ALT 16 02/21/2021 0945   BILITOT 0.7 02/21/2021 0945       RADIOGRAPHIC STUDIES: No results found.  ASSESSMENT AND PLAN: This is a very pleasant 71 years old white male recently diagnosed with stage IIb (T1b, N1, M0) non-small cell lung cancer, adenocarcinoma status post right upper lobectomy with lymph node dissection under the care of Dr. Roxan Hockey on June 17, 2019. The patient had molecular studies on the [facility] clinical trial that showed negative for EGFR mutation as well as ALK gene translocation but PD-L1 expression in the range of 50-100%. The patient underwent standard adjuvant systemic chemotherapy with cisplatin 75 mg/M2 and Alimta 500 mg/M2  every 3 weeks status post 3 cycles.  His treatment was discontinued secondary to intolerance. Unfortunately repeat CT scan as well as PET scan after completion of the adjuvant chemotherapy showed concerning findings for disease recurrence with small right pleural effusion as well as multiple pleural implants consistent with metastatic disease which was confirmed with repeat biopsy. He had molecular studies by [employer] and the blood test showed no actionable mutation.  His PD-L1 expression was 90%. The patient is currently undergoing treatment with immunotherapy with Libtayo (Cempilimab) 350 mg IV every 3 weeks status post 19 cycles. He has been tolerating this treatment well with no concerning adverse effects. I recommended for the patient to proceed with cycle #20 today as planned. I will see him back for follow-up visit in 3 weeks for evaluation with repeat CT scan of the chest, abdomen and pelvis for restaging of his disease. The patient was advised to call immediately if he has any other concerning symptoms in the interval. The patient voices understanding of current disease status and treatment options and is in agreement with the current care plan.  All questions were answered. The patient knows to call the clinic with any problems, questions or concerns. We can certainly see the patient much sooner if necessary.  Disclaimer: This note was dictated with voice recognition software. Similar sounding words can inadvertently be transcribed and may not be corrected upon review.

## 2021-03-19 ENCOUNTER — Telehealth: Payer: Self-pay | Admitting: Internal Medicine

## 2021-03-19 NOTE — Telephone Encounter (Signed)
Scheduled per 03/01 los, patient has been called and voicemail was left. ?

## 2021-03-28 DIAGNOSIS — Z20822 Contact with and (suspected) exposure to covid-19: Secondary | ICD-10-CM | POA: Diagnosis not present

## 2021-04-02 DIAGNOSIS — I2510 Atherosclerotic heart disease of native coronary artery without angina pectoris: Secondary | ICD-10-CM | POA: Diagnosis not present

## 2021-04-02 DIAGNOSIS — J929 Pleural plaque without asbestos: Secondary | ICD-10-CM | POA: Diagnosis not present

## 2021-04-02 DIAGNOSIS — I700 Atherosclerosis of aorta: Secondary | ICD-10-CM | POA: Diagnosis not present

## 2021-04-02 DIAGNOSIS — K7689 Other specified diseases of liver: Secondary | ICD-10-CM | POA: Diagnosis not present

## 2021-04-02 DIAGNOSIS — N281 Cyst of kidney, acquired: Secondary | ICD-10-CM | POA: Diagnosis not present

## 2021-04-02 DIAGNOSIS — C3411 Malignant neoplasm of upper lobe, right bronchus or lung: Secondary | ICD-10-CM | POA: Diagnosis not present

## 2021-04-02 DIAGNOSIS — D71 Functional disorders of polymorphonuclear neutrophils: Secondary | ICD-10-CM | POA: Diagnosis not present

## 2021-04-03 ENCOUNTER — Ambulatory Visit
Admission: RE | Admit: 2021-04-03 | Discharge: 2021-04-03 | Disposition: A | Discharge status: Home / Self Care | Payer: Medicare Other | Source: Ambulatory Visit | Attending: Internal Medicine | Admitting: Internal Medicine

## 2021-04-03 ENCOUNTER — Other Ambulatory Visit: Payer: Self-pay | Admitting: Internal Medicine

## 2021-04-03 ENCOUNTER — Encounter: Payer: Self-pay | Admitting: Internal Medicine

## 2021-04-03 DIAGNOSIS — C3490 Malignant neoplasm of unspecified part of unspecified bronchus or lung: Secondary | ICD-10-CM | POA: Insufficient documentation

## 2021-04-03 DIAGNOSIS — C3411 Malignant neoplasm of upper lobe, right bronchus or lung: Secondary | ICD-10-CM | POA: Diagnosis not present

## 2021-04-03 DIAGNOSIS — N281 Cyst of kidney, acquired: Secondary | ICD-10-CM | POA: Diagnosis not present

## 2021-04-03 DIAGNOSIS — K7689 Other specified diseases of liver: Secondary | ICD-10-CM | POA: Diagnosis not present

## 2021-04-03 DIAGNOSIS — I700 Atherosclerosis of aorta: Secondary | ICD-10-CM | POA: Diagnosis not present

## 2021-04-03 DIAGNOSIS — I2510 Atherosclerotic heart disease of native coronary artery without angina pectoris: Secondary | ICD-10-CM | POA: Diagnosis not present

## 2021-04-03 DIAGNOSIS — D71 Functional disorders of polymorphonuclear neutrophils: Secondary | ICD-10-CM | POA: Diagnosis not present

## 2021-04-03 DIAGNOSIS — J929 Pleural plaque without asbestos: Secondary | ICD-10-CM | POA: Diagnosis not present

## 2021-04-03 MED ORDER — IOHEXOL 300 MG/ML  SOLN
100.0000 mL | Freq: Once | INTRAMUSCULAR | Status: AC | PRN
  Administered 2021-04-03: 100 mL via INTRAVENOUS

## 2021-04-04 ENCOUNTER — Telehealth: Payer: Self-pay | Admitting: Internal Medicine

## 2021-04-04 NOTE — Telephone Encounter (Signed)
.  Called patient to schedule appointment per 3/22 inbasket, patient is aware of date and time.   ?

## 2021-04-05 ENCOUNTER — Inpatient Hospital Stay: Payer: Medicare Other

## 2021-04-05 ENCOUNTER — Other Ambulatory Visit: Payer: Self-pay

## 2021-04-05 ENCOUNTER — Encounter: Payer: Self-pay | Admitting: Internal Medicine

## 2021-04-05 ENCOUNTER — Inpatient Hospital Stay (HOSPITAL_BASED_OUTPATIENT_CLINIC_OR_DEPARTMENT_OTHER): Payer: Medicare Other | Admitting: Internal Medicine

## 2021-04-05 VITALS — BP 125/87 | HR 78 | Temp 97.3°F | Resp 19 | Ht 66.0 in | Wt 150.1 lb

## 2021-04-05 DIAGNOSIS — R5383 Other fatigue: Secondary | ICD-10-CM

## 2021-04-05 DIAGNOSIS — C3491 Malignant neoplasm of unspecified part of right bronchus or lung: Secondary | ICD-10-CM

## 2021-04-05 DIAGNOSIS — Z902 Acquired absence of lung [part of]: Secondary | ICD-10-CM | POA: Diagnosis not present

## 2021-04-05 DIAGNOSIS — J449 Chronic obstructive pulmonary disease, unspecified: Secondary | ICD-10-CM | POA: Diagnosis not present

## 2021-04-05 DIAGNOSIS — C782 Secondary malignant neoplasm of pleura: Secondary | ICD-10-CM | POA: Diagnosis not present

## 2021-04-05 DIAGNOSIS — Z5112 Encounter for antineoplastic immunotherapy: Secondary | ICD-10-CM | POA: Diagnosis not present

## 2021-04-05 DIAGNOSIS — K219 Gastro-esophageal reflux disease without esophagitis: Secondary | ICD-10-CM | POA: Diagnosis not present

## 2021-04-05 DIAGNOSIS — C3411 Malignant neoplasm of upper lobe, right bronchus or lung: Secondary | ICD-10-CM | POA: Diagnosis not present

## 2021-04-05 LAB — CMP (CANCER CENTER ONLY)
ALT: 13 U/L (ref 0–44)
AST: 13 U/L — ABNORMAL LOW (ref 15–41)
Albumin: 4.4 g/dL (ref 3.5–5.0)
Alkaline Phosphatase: 51 U/L (ref 38–126)
Anion gap: 6 (ref 5–15)
BUN: 19 mg/dL (ref 8–23)
CO2: 28 mmol/L (ref 22–32)
Calcium: 9.3 mg/dL (ref 8.9–10.3)
Chloride: 101 mmol/L (ref 98–111)
Creatinine: 1.25 mg/dL — ABNORMAL HIGH (ref 0.61–1.24)
GFR, Estimated: >60 mL/min (ref >60)
Glucose, Bld: 177 mg/dL — ABNORMAL HIGH (ref 70–99)
Potassium: 4.3 mmol/L (ref 3.5–5.1)
Sodium: 135 mmol/L (ref 135–145)
Total Bilirubin: 0.7 mg/dL (ref 0.3–1.2)
Total Protein: 6.7 g/dL (ref 6.5–8.1)

## 2021-04-05 LAB — CBC WITH DIFFERENTIAL (CANCER CENTER ONLY)
Abs Immature Granulocytes: 0.02 10*3/uL (ref 0.00–0.07)
Basophils Absolute: 0.1 10*3/uL (ref 0.0–0.1)
Basophils Relative: 1 %
Eosinophils Absolute: 0.3 10*3/uL (ref 0.0–0.5)
Eosinophils Relative: 4 %
HCT: 43.2 % (ref 39.0–52.0)
Hemoglobin: 15.4 g/dL (ref 13.0–17.0)
Immature Granulocytes: 0 %
Lymphocytes Relative: 19 %
Lymphs Abs: 1.5 10*3/uL (ref 0.7–4.0)
MCH: 31.1 pg (ref 26.0–34.0)
MCHC: 35.6 g/dL (ref 30.0–36.0)
MCV: 87.3 fL (ref 80.0–100.0)
Monocytes Absolute: 0.5 10*3/uL (ref 0.1–1.0)
Monocytes Relative: 7 %
Neutro Abs: 5.4 10*3/uL (ref 1.7–7.7)
Neutrophils Relative %: 69 %
Platelet Count: 195 10*3/uL (ref 150–400)
RBC: 4.95 MIL/uL (ref 4.22–5.81)
RDW: 12.1 % (ref 11.5–15.5)
WBC Count: 7.9 10*3/uL (ref 4.0–10.5)
nRBC: 0 % (ref 0.0–0.2)

## 2021-04-05 LAB — TSH: TSH: 1.635 u[IU]/mL (ref 0.320–4.118)

## 2021-04-05 MED ORDER — HEPARIN SOD (PORK) LOCK FLUSH 100 UNIT/ML IV SOLN
500.0000 [IU] | Freq: Once | INTRAVENOUS | Status: AC | PRN
  Administered 2021-04-05: 500 [IU]

## 2021-04-05 MED ORDER — SODIUM CHLORIDE 0.9 % IV SOLN
350.0000 mg | Freq: Once | INTRAVENOUS | Status: AC
  Administered 2021-04-05: 350 mg via INTRAVENOUS
  Filled 2021-04-05: qty 7

## 2021-04-05 MED ORDER — SODIUM CHLORIDE 0.9 % IV SOLN: Freq: Once | INTRAVENOUS | Status: AC

## 2021-04-05 MED ORDER — SODIUM CHLORIDE 0.9% FLUSH
10.0000 mL | INTRAVENOUS | Status: DC | PRN
  Administered 2021-04-05: 10 mL

## 2021-04-05 NOTE — Patient Instructions (Signed)
[facility]  Discharge Instructions: ?Thank you for choosing [facility] to provide your oncology and hematology care.  ? ?If you have a lab appointment with the [facility], please go directly to the [facility] and check in at the registration area. ?  ?Wear comfortable clothing and clothing appropriate for easy access to any Portacath or PICC line.  ? ?We strive to give you quality time with your provider. You may need to reschedule your appointment if you arrive late (15 or more minutes).  Arriving late affects you and other patients whose appointments are after yours.  Also, if you miss three or more appointments without notifying the office, you may be dismissed from the clinic at the provider?s discretion.    ?  ?For prescription refill requests, have your pharmacy contact our office and allow 72 hours for refills to be completed.   ? ?Today you received the following chemotherapy and/or immunotherapy agents: Libtayo    ?  ?To help prevent nausea and vomiting after your treatment, we encourage you to take your nausea medication as directed. ? ?BELOW ARE SYMPTOMS THAT SHOULD BE REPORTED IMMEDIATELY: ?*FEVER GREATER THAN 100.4 F (38 ?C) OR HIGHER ?*CHILLS OR SWEATING ?*NAUSEA AND VOMITING THAT IS NOT CONTROLLED WITH YOUR NAUSEA MEDICATION ?*UNUSUAL SHORTNESS OF BREATH ?*UNUSUAL BRUISING OR BLEEDING ?*URINARY PROBLEMS (pain or burning when urinating, or frequent urination) ?*BOWEL PROBLEMS (unusual diarrhea, constipation, pain near the anus) ?TENDERNESS IN MOUTH AND THROAT WITH OR WITHOUT PRESENCE OF ULCERS (sore throat, sores in mouth, or a toothache) ?UNUSUAL RASH, SWELLING OR PAIN  ?UNUSUAL VAGINAL DISCHARGE OR ITCHING  ? ?Items with * indicate a potential emergency and should be followed up as soon as possible or go to the Emergency Department if any problems should occur. ? ?Please show the CHEMOTHERAPY ALERT CARD or IMMUNOTHERAPY ALERT CARD at check-in to  the Emergency Department and triage nurse. ? ?Should you have questions after your visit or need to cancel or reschedule your appointment, please contact [facility]  Dept: [phone]  and follow the prompts.  Office hours are 8:00 a.m. to 4:30 p.m. Monday - Friday. Please note that voicemails left after 4:00 p.m. may not be returned until the following business day.  We are closed weekends and major holidays. You have access to a nurse at all times for urgent questions. Please call the main number to the clinic Dept: [phone] and follow the prompts. ? ? ?For any non-urgent questions, you may also contact your provider using MyChart. We now offer e-Visits for anyone 94 and older to request care online for non-urgent symptoms. For details visit [email or web address]. ?  ?Also download the MyChart app! Go to the app store, search "MyChart", open the app, select , and log in with your MyChart username and password. ? ?Due to Covid, a mask is required upon entering the hospital/clinic. If you do not have a mask, one will be given to you upon arrival. For doctor visits, patients may have 1 support person aged 68 or older with them. For treatment visits, patients cannot have anyone with them due to current Covid guidelines and our immunocompromised population.  ? ?

## 2021-04-05 NOTE — Progress Notes (Signed)
?    [facility] ?Telephone:([phone]   [phone] ? ?OFFICE PROGRESS NOTE ? ?Luis Huddle, MD ?[address] ?[place] [address] ? ?DIAGNOSIS: Metastatic non-small cell lung cancer initially diagnosed as stage IIB (T1b, N1, M0) non-small cell lung cancer, invasive poorly differentiated carcinoma diagnosed in June 2021 with disease recurrence in November 2021 ?Molecular studies are negative for EGFR mutation as well as ALK gene translocation.  PD-L1 expression was 50-100%.  This is based on the report from the [employer] trial. ?Molecular studies by [employer]: Showed no actionable mutations and PD-L1 expression was 90% ? ?PRIOR THERAPY:  ?1) Status post right upper lobectomy with lymph node dissection on June 17, 2019 under the care of Dr. Roxan Hockey. ?2) Adjuvant systemic chemotherapy with cisplatin 75 mg/M2 and Alimta 500 mg/M2 every 3 weeks.  First dose September 02, 2019. Status post 3 cycles.  His treatment was discontinued secondary to intolerance. ? ?CURRENT THERAPY: First-line treatment with immunotherapy with Libtayo (Cempilimab) 350 mg IV every 3 weeks status post 20 cycles. ? ?INTERVAL HISTORY: ?Luis [name] 71 y.o. male returns to the clinic today for follow-up visit.  The patient is feeling fine today with no concerning complaints.  He denied having any current chest pain, shortness of breath, cough or hemoptysis.  He denied having any fever or chills.  He has no nausea, vomiting, diarrhea or constipation.  He has no headache or visual changes.  He continues to tolerate his treatment with Libtayo (Cempilimab) fairly well.  The patient is here today for evaluation with repeat CT scan of the chest for restaging of his disease. ? ?MEDICAL HISTORY: ?Past Medical History:  ?Diagnosis Date  ? Arthritis   ? through out body  ? Chronic bronchitis ([facility])   ? Concussion   ? COPD (chronic obstructive pulmonary disease) ([facility])   ? Dyspnea   ? Emphysema lung ([facility])   ?  Fatigue   ? Frozen shoulder   ? LEFT  ? GERD (gastroesophageal reflux disease)   ? Hard of hearing   ? Headache   ? alot of days, related to spine issues  ? Hypertension   ? Lumbar radiculopathy 2013  ? nscl ca 04/2019  ? Peyronie's disease   ? Prostatitis 2011  ? Dr. Gaynelle Arabian  ? RLS (restless legs syndrome)   ? Shingles 06/13/2018  ? shingles on back, finished with prednisone, stills has scabs and pain  ? Thigh pain   ? Bilateral Thigh  ? Thigh pain 10/2011  ? bilateral  ? Tobacco abuse 2010  ? still uses electronic cig/ emphysema, SOB easily  ? Vertigo   ? per wifes update  ? Wears partial dentures   ? upper  ? ? ?ALLERGIES:  is allergic to ativan [lorazepam], doxycycline, and tramadol. ? ?MEDICATIONS:  ?Current Outpatient Medications  ?Medication Sig Dispense Refill  ? acetaminophen (TYLENOL) 500 MG tablet Take 1,000 mg by mouth at bedtime as needed for moderate pain.    ? Cholecalciferol (VITAMIN D) 125 MCG (5000 UT) CAPS Take 5,000 Units by mouth 3 (three) times a week.    ? citalopram (CELEXA) 20 MG tablet Take 1 tablet by mouth daily.    ? Fluticasone-Salmeterol (ADVAIR) 100-50 MCG/DOSE AEPB Inhale 2 puffs into the lungs 2 (two) times daily.    ? lidocaine-prilocaine (EMLA) cream Apply 1 application topically as needed. Apply 1 tsp over port site at least 45 minutes prior to lab appointment.Do not rub in cream. Cover  with plastic wrap. 30 g 0  ? Multiple Vitamin (MULTIVITAMIN) tablet Take 1 tablet by mouth daily.    ? valsartan-hydrochlorothiazide (DIOVAN-HCT) 80-12.5 MG tablet Take 1 tablet by mouth daily.    ? vitamin B-12 (CYANOCOBALAMIN) 500 MCG tablet 1 tablet    ? ?No current facility-administered medications for this visit.  ? ? ?SURGICAL HISTORY:  ?Past Surgical History:  ?Procedure Laterality Date  ? CATARACT EXTRACTION W/PHACO Right 07/08/2018  ? Procedure: CATARACT EXTRACTION PHACO AND INTRAOCULAR LENS PLACEMENT ([place]) RIGHT;  Surgeon: Leandrew Koyanagi, MD;  Location: [facility];   Service: Ophthalmology;  Laterality: Right;  ? CATARACT EXTRACTION W/PHACO Left 07/29/2018  ? Procedure: CATARACT EXTRACTION PHACO AND INTRAOCULAR LENS PLACEMENT ([place]) LEFT TORIC LENS;  Surgeon: Leandrew Koyanagi, MD;  Location: [facility];  Service: Ophthalmology;  Laterality: Left;  ? CHEST TUBE INSERTION Right 07/01/2019  ? Procedure: CHEST TUBE INSERTION;  Surgeon: [name], MD;  Location: [facility];  Service: Thoracic;  Laterality: Right;  ? COLONOSCOPY    ? EYE SURGERY    ? INTERCOSTAL NERVE BLOCK Right 06/17/2019  ? Procedure: INTERCOSTAL NERVE BLOCK;  Surgeon: [name], MD;  Location: [facility];  Service: Thoracic;  Laterality: Right;  ? INTERCOSTAL NERVE BLOCK Right 07/01/2019  ? Procedure: INTERCOSTAL NERVE BLOCK;  Surgeon: [name], MD;  Location: [facility];  Service: Thoracic;  Laterality: Right;  ? IR IMAGING GUIDED PORT INSERTION  02/21/2020  ? TONSILLECTOMY    ? VIDEO ASSISTED THORACOSCOPY Right 07/01/2019  ? Procedure: VIDEO ASSISTED THORACOSCOPY - REMOVAL OF RIGHT MIDDLE LOBE BLEBS;  Surgeon: [name], MD;  Location: [facility];  Service: Thoracic;  Laterality: Right;  ? VIDEO BRONCHOSCOPY WITH INSERTION OF INTERBRONCHIAL VALVE (IBV) Right 06/25/2019  ? Procedure: VIDEO BRONCHOSCOPY WITH INSERTION OF INTERBRONCHIAL VALVE (IBV); Size 7 IBV into Right Loer Lobe (RLL) Superior Segment, Size 9 IBV into RLL Basilar Segment;  Surgeon: [name], MD;  Location: [facility];  Service: Thoracic;  Laterality: Right;  ? VIDEO BRONCHOSCOPY WITH INSERTION OF INTERBRONCHIAL VALVE (IBV) N/A 08/06/2019  ? Procedure: VIDEO BRONCHOSCOPY WITH REMOVAL OF INTERBRONCHIAL VALVE (IBV);  Surgeon: [name], MD;  Location[facility];  Service: Thoracic;  Laterality: N/A;  ? ? ?REVIEW OF SYSTEMS:  Constitutional: negative ?Eyes: negative ?Ears, nose, mouth, throat, and face: negative ?Respiratory: negative ?Cardiovascular: negative ?Gastrointestinal:  negative ?Genitourinary:negative ?Integument/breast: negative ?Hematologic/lymphatic: negative ?Musculoskeletal:negative ?Neurological: negative ?Behavioral/Psych: negative ?Endocrine: negative ?Allergic/Immunologic: negative  ? ?PHYSICAL EXAMINATION: General appearance: alert, cooperative, and no distress ?Head: Normocephalic, without obvious abnormality, atraumatic ?Neck: no adenopathy, no JVD, supple, symmetrical, trachea midline, and thyroid not enlarged, symmetric, no tenderness/mass/nodules ?Lymph nodes: Cervical, supraclavicular, and axillary nodes normal. ?Resp: clear to auscultation bilaterally ?Back: symmetric, no curvature. ROM normal. No CVA tenderness. ?Cardio: regular rate and rhythm, S1, S2 normal, no murmur, click, rub or gallop ?GI: soft, non-tender; bowel sounds normal; no masses,  no organomegaly ?Extremities: extremities normal, atraumatic, no cyanosis or edema ?Neurologic: Alert and oriented X 3, normal strength and tone. Normal symmetric reflexes. Normal coordination and gait ? ?ECOG PERFORMANCE STATUS: 0 - Asymptomatic ? ?Blood pressure 125/87, pulse 78, temperature (!) 97.3 ?F (36.3 ?C), temperature source Tympanic, resp. rate 19, height 5' 6"  (1.676 m), weight 150 lb 1.6 oz (68.1 kg), SpO2 100 %. ? ?LABORATORY DATA: ?Lab Results  ?Component Value Date  ? WBC 7.9 04/05/2021  ? HGB 15.4 04/05/2021  ? HCT 43.2 04/05/2021  ? MCV 87.3 04/05/2021  ?  PLT 195 04/05/2021  ? ? ?  Chemistry   ?   ?Component Value Date/Time  ? NA 135 04/05/2021 0935  ? K 4.3 04/05/2021 0935  ? CL 101 04/05/2021 0935  ? CO2 28 04/05/2021 0935  ? BUN 19 04/05/2021 0935  ? CREATININE 1.25 (H) 04/05/2021 0935  ?    ?Component Value Date/Time  ? CALCIUM 9.3 04/05/2021 0935  ? ALKPHOS 51 04/05/2021 0935  ? AST 13 (L) 04/05/2021 0935  ? ALT 13 04/05/2021 0935  ? BILITOT 0.7 04/05/2021 0935  ?  ? ? ? ?RADIOGRAPHIC STUDIES: ?CT CHEST ABDOMEN PELVIS W CONTRAST ? ?Result Date: 04/04/2021 ?CLINICAL DATA:  Primary Cancer Type: Lung *  Tracking Code: BO * Imaging Indication: Assess response to therapy Interval therapy since last imaging? Yes Initial Cancer Diagnosis Date: 06/17/2019; Established by: Biopsy-proven Detailed Pathology: Metastatic non-small cell lung

## 2021-04-25 ENCOUNTER — Encounter: Payer: Self-pay | Admitting: Internal Medicine

## 2021-04-25 ENCOUNTER — Inpatient Hospital Stay: Payer: Medicare Other | Attending: Internal Medicine | Admitting: Internal Medicine

## 2021-04-25 ENCOUNTER — Encounter: Payer: Self-pay | Admitting: *Deleted

## 2021-04-25 ENCOUNTER — Inpatient Hospital Stay: Payer: Medicare Other

## 2021-04-25 ENCOUNTER — Other Ambulatory Visit: Payer: Self-pay

## 2021-04-25 ENCOUNTER — Other Ambulatory Visit: Payer: Self-pay | Admitting: *Deleted

## 2021-04-25 VITALS — BP 134/84 | HR 103 | Temp 97.2°F | Resp 18 | Ht 66.0 in | Wt 141.7 lb

## 2021-04-25 VITALS — HR 95

## 2021-04-25 DIAGNOSIS — R739 Hyperglycemia, unspecified: Secondary | ICD-10-CM

## 2021-04-25 DIAGNOSIS — Z902 Acquired absence of lung [part of]: Secondary | ICD-10-CM | POA: Insufficient documentation

## 2021-04-25 DIAGNOSIS — E109 Type 1 diabetes mellitus without complications: Secondary | ICD-10-CM | POA: Diagnosis not present

## 2021-04-25 DIAGNOSIS — E1165 Type 2 diabetes mellitus with hyperglycemia: Secondary | ICD-10-CM | POA: Diagnosis not present

## 2021-04-25 DIAGNOSIS — R631 Polydipsia: Secondary | ICD-10-CM | POA: Diagnosis not present

## 2021-04-25 DIAGNOSIS — Z5112 Encounter for antineoplastic immunotherapy: Secondary | ICD-10-CM

## 2021-04-25 DIAGNOSIS — E119 Type 2 diabetes mellitus without complications: Secondary | ICD-10-CM

## 2021-04-25 DIAGNOSIS — Z79899 Other long term (current) drug therapy: Secondary | ICD-10-CM | POA: Insufficient documentation

## 2021-04-25 DIAGNOSIS — E139 Other specified diabetes mellitus without complications: Secondary | ICD-10-CM | POA: Diagnosis not present

## 2021-04-25 DIAGNOSIS — R7309 Other abnormal glucose: Secondary | ICD-10-CM

## 2021-04-25 DIAGNOSIS — C3411 Malignant neoplasm of upper lobe, right bronchus or lung: Secondary | ICD-10-CM | POA: Insufficient documentation

## 2021-04-25 DIAGNOSIS — R351 Nocturia: Secondary | ICD-10-CM | POA: Diagnosis not present

## 2021-04-25 DIAGNOSIS — Z5111 Encounter for antineoplastic chemotherapy: Secondary | ICD-10-CM

## 2021-04-25 DIAGNOSIS — Z794 Long term (current) use of insulin: Secondary | ICD-10-CM | POA: Diagnosis not present

## 2021-04-25 DIAGNOSIS — T50905A Adverse effect of unspecified drugs, medicaments and biological substances, initial encounter: Secondary | ICD-10-CM | POA: Diagnosis not present

## 2021-04-25 DIAGNOSIS — C3491 Malignant neoplasm of unspecified part of right bronchus or lung: Secondary | ICD-10-CM

## 2021-04-25 LAB — CBC WITH DIFFERENTIAL (CANCER CENTER ONLY)
Abs Immature Granulocytes: 0.07 10*3/uL (ref 0.00–0.07)
Basophils Absolute: 0.1 10*3/uL (ref 0.0–0.1)
Basophils Relative: 1 %
Eosinophils Absolute: 0.3 10*3/uL (ref 0.0–0.5)
Eosinophils Relative: 3 %
HCT: 44.6 % (ref 39.0–52.0)
Hemoglobin: 16.4 g/dL (ref 13.0–17.0)
Immature Granulocytes: 1 %
Lymphocytes Relative: 13 %
Lymphs Abs: 1.4 10*3/uL (ref 0.7–4.0)
MCH: 30.8 pg (ref 26.0–34.0)
MCHC: 36.8 g/dL — ABNORMAL HIGH (ref 30.0–36.0)
MCV: 83.8 fL (ref 80.0–100.0)
Monocytes Absolute: 0.6 10*3/uL (ref 0.1–1.0)
Monocytes Relative: 6 %
Neutro Abs: 8.3 10*3/uL — ABNORMAL HIGH (ref 1.7–7.7)
Neutrophils Relative %: 76 %
Platelet Count: 191 10*3/uL (ref 150–400)
RBC: 5.32 MIL/uL (ref 4.22–5.81)
RDW: 11.7 % (ref 11.5–15.5)
WBC Count: 10.7 10*3/uL — ABNORMAL HIGH (ref 4.0–10.5)
nRBC: 0 % (ref 0.0–0.2)

## 2021-04-25 LAB — CMP (CANCER CENTER ONLY)
ALT: 15 U/L (ref 0–44)
AST: 12 U/L — ABNORMAL LOW (ref 15–41)
Albumin: 4.5 g/dL (ref 3.5–5.0)
Alkaline Phosphatase: 60 U/L (ref 38–126)
Anion gap: 13 (ref 5–15)
BUN: 24 mg/dL — ABNORMAL HIGH (ref 8–23)
CO2: 23 mmol/L (ref 22–32)
Calcium: 9.4 mg/dL (ref 8.9–10.3)
Chloride: 93 mmol/L — ABNORMAL LOW (ref 98–111)
Creatinine: 1.42 mg/dL — ABNORMAL HIGH (ref 0.61–1.24)
GFR, Estimated: 53 mL/min — ABNORMAL LOW (ref >60)
Glucose, Bld: 500 mg/dL — ABNORMAL HIGH (ref 70–99)
Potassium: 4.6 mmol/L (ref 3.5–5.1)
Sodium: 129 mmol/L — ABNORMAL LOW (ref 135–145)
Total Bilirubin: 0.8 mg/dL (ref 0.3–1.2)
Total Protein: 7.2 g/dL (ref 6.5–8.1)

## 2021-04-25 LAB — GLUCOSE, CAPILLARY: Glucose-Capillary: 428 mg/dL — ABNORMAL HIGH (ref 70–99)

## 2021-04-25 LAB — TSH: TSH: 1.188 u[IU]/mL (ref 0.320–4.118)

## 2021-04-25 MED ORDER — SODIUM CHLORIDE 0.9 % IV SOLN: Freq: Once | INTRAVENOUS | Status: AC

## 2021-04-25 MED ORDER — SODIUM CHLORIDE 0.9% FLUSH
10.0000 mL | INTRAVENOUS | Status: DC | PRN
  Administered 2021-04-25: 10 mL

## 2021-04-25 MED ORDER — HEPARIN SOD (PORK) LOCK FLUSH 100 UNIT/ML IV SOLN
500.0000 [IU] | Freq: Once | INTRAVENOUS | Status: AC | PRN
  Administered 2021-04-25: 500 [IU]

## 2021-04-25 MED ORDER — SODIUM CHLORIDE 0.9 % IV SOLN
350.0000 mg | Freq: Once | INTRAVENOUS | Status: AC
  Administered 2021-04-25: 350 mg via INTRAVENOUS
  Filled 2021-04-25: qty 7

## 2021-04-25 MED ORDER — INSULIN ASPART 100 UNIT/ML IJ SOLN
15.0000 [IU] | Freq: Once | INTRAMUSCULAR | Status: AC
  Administered 2021-04-25: 15 [IU] via SUBCUTANEOUS
  Filled 2021-04-25: qty 1

## 2021-04-25 NOTE — Progress Notes (Signed)
Oncology Nurse Navigator Documentation ? ? ?  04/25/2021  ? 11:00 AM  ?Oncology Nurse Navigator Flowsheets  ?Navigator Location [facility]  ?Navigator Encounter Type Clinic/[facility]  ?Barriers/Navigation Needs Education;Coordination of Care/per Dr. Julien Nordmann, patient has high blood sugar and needs urgent referral back to PCP and endocrinology.  I completed urgent referral to PCP and notified infusion nurses that patient has order for insulin and urgent referrals. Desk nurse will make referral to endocrinology. Dr. Julien Nordmann called PCP office and they will see him today and updated patient via his cell phone. I updated patient he will get a call from PCP office.  Patient was thankful for the update.   ?Education Other  ?Interventions Education;Psycho-Social Support;Coordination of Care  ?Acuity Level 3-Moderate Needs (3-4 Barriers Identified)  ?Coordination of Care Other  ?Education Method Verbal  ?Time Spent with Patient 60  ?  ?

## 2021-04-25 NOTE — Patient Instructions (Signed)
[facility]  Discharge Instructions: ?Thank you for choosing [facility] to provide your oncology and hematology care.  ? ?If you have a lab appointment with the [facility], please go directly to the [facility] and check in at the registration area. ?  ?Wear comfortable clothing and clothing appropriate for easy access to any Portacath or PICC line.  ? ?We strive to give you quality time with your provider. You may need to reschedule your appointment if you arrive late (15 or more minutes).  Arriving late affects you and other patients whose appointments are after yours.  Also, if you miss three or more appointments without notifying the office, you may be dismissed from the clinic at the provider?s discretion.    ?  ?For prescription refill requests, have your pharmacy contact our office and allow 72 hours for refills to be completed.   ? ?Today you received the following chemotherapy and/or immunotherapy agents: Libtayo    ?  ?To help prevent nausea and vomiting after your treatment, we encourage you to take your nausea medication as directed. ? ?BELOW ARE SYMPTOMS THAT SHOULD BE REPORTED IMMEDIATELY: ?*FEVER GREATER THAN 100.4 F (38 ?C) OR HIGHER ?*CHILLS OR SWEATING ?*NAUSEA AND VOMITING THAT IS NOT CONTROLLED WITH YOUR NAUSEA MEDICATION ?*UNUSUAL SHORTNESS OF BREATH ?*UNUSUAL BRUISING OR BLEEDING ?*URINARY PROBLEMS (pain or burning when urinating, or frequent urination) ?*BOWEL PROBLEMS (unusual diarrhea, constipation, pain near the anus) ?TENDERNESS IN MOUTH AND THROAT WITH OR WITHOUT PRESENCE OF ULCERS (sore throat, sores in mouth, or a toothache) ?UNUSUAL RASH, SWELLING OR PAIN  ?UNUSUAL VAGINAL DISCHARGE OR ITCHING  ? ?Items with * indicate a potential emergency and should be followed up as soon as possible or go to the Emergency Department if any problems should occur. ? ?Please show the CHEMOTHERAPY ALERT CARD or IMMUNOTHERAPY ALERT CARD at check-in to  the Emergency Department and triage nurse. ? ?Should you have questions after your visit or need to cancel or reschedule your appointment, please contact [facility]  Dept: [phone]  and follow the prompts.  Office hours are 8:00 a.m. to 4:30 p.m. Monday - Friday. Please note that voicemails left after 4:00 p.m. may not be returned until the following business day.  We are closed weekends and major holidays. You have access to a nurse at all times for urgent questions. Please call the main number to the clinic Dept: [phone] and follow the prompts. ? ? ?For any non-urgent questions, you may also contact your provider using MyChart. We now offer e-Visits for anyone 64 and older to request care online for non-urgent symptoms. For details visit [email or web address]. ?  ?Also download the MyChart app! Go to the app store, search "MyChart", open the app, select [facility], and log in with your MyChart username and password. ? ?Due to Covid, a mask is required upon entering the hospital/clinic. If you do not have a mask, one will be given to you upon arrival. For doctor visits, patients may have 1 support person aged 17 or older with them. For treatment visits, patients cannot have anyone with them due to current Covid guidelines and our immunocompromised population.  ? ?

## 2021-04-25 NOTE — Progress Notes (Signed)
Dr. Julien Nordmann aware of high blood sugar- to proceed with treatment. Patient given 15 units of novolog and rechecked with a blood sugar of 428. MD notified. ?Dr. Julien Nordmann aware of blood sugar of 428 and states that patient is to make sure he sees his PCP today- patient's wife calling. Endocrinologist contacted by Dr. Julien Nordmann. Norton Blizzard came over to see patient and clarify that he needs to show up at his MD office. ?

## 2021-04-25 NOTE — Progress Notes (Signed)
Patient to receive Novolog 15 units in infusion room today.  Order entered per MD discussion. ? ?Acquanetta Belling, [place], BCPS, BCOP ?04/25/2021 ?11:26 AM ? ?

## 2021-04-25 NOTE — Progress Notes (Signed)
?    [facility] ?[phone]   [phone] ? ?OFFICE PROGRESS NOTE ? ?[name], MD ?[address] ?[place] [address] ? ?DIAGNOSIS:  ?1) Metastatic non-small cell lung cancer initially diagnosed as stage IIB (T1b, N1, M0) non-small cell lung cancer, invasive poorly differentiated carcinoma diagnosed in June 2021 with disease recurrence in November 2021 ?Molecular studies are negative for EGFR mutation as well as ALK gene translocation.  PD-L1 expression was 50-100%.  This is based on the report from the [facility] trial. ?Molecular studies by [facility]: Showed no actionable mutations and PD-L1 expression was 90% ?2) immunotherapy induced type 1 diabetes mellitus. ? ?PRIOR THERAPY:  ?1) Status post right upper lobectomy with lymph node dissection on June 17, 2019 under the care of Dr. Roxan Hockey. ?2) Adjuvant systemic chemotherapy with cisplatin 75 mg/M2 and Alimta 500 mg/M2 every 3 weeks.  First dose September 02, 2019. Status post 3 cycles.  His treatment was discontinued secondary to intolerance. ? ?CURRENT THERAPY: First-line treatment with immunotherapy with Libtayo (Cempilimab) 350 mg IV every 3 weeks status post 21 cycles. ? ?INTERVAL HISTORY: ?Luis [name] 71 y.o. male returns to the clinic today for follow-up visit.  The patient mentions that he started having sudden weight loss of more than 10 pounds in the last 2 weeks.  He is also extremely thirsty in the past 2 weeks and cannot have enough to drink.  He denied having any current chest pain, shortness of breath, cough or hemoptysis.  He denied having any fever or chills.  He has no nausea, vomiting, diarrhea or constipation.  He has no headache or visual changes.  He is here today for evaluation before restarting cycle #22 of his treatment. ? ?MEDICAL HISTORY: ?Past Medical History:  ?Diagnosis Date  ? Arthritis   ? through out body  ? Chronic bronchitis ([facility])   ? Concussion   ? COPD (chronic  obstructive pulmonary disease) ([facility])   ? Dyspnea   ? Emphysema lung ([facility])   ? Fatigue   ? Frozen shoulder   ? LEFT  ? GERD (gastroesophageal reflux disease)   ? Hard of hearing   ? Headache   ? alot of days, related to spine issues  ? Hypertension   ? Lumbar radiculopathy 2013  ? nscl ca 04/2019  ? Peyronie's disease   ? Prostatitis 2011  ? Dr. Gaynelle Arabian  ? RLS (restless legs syndrome)   ? Shingles 06/13/2018  ? shingles on back, finished with prednisone, stills has scabs and pain  ? Thigh pain   ? Bilateral Thigh  ? Thigh pain 10/2011  ? bilateral  ? Tobacco abuse 2010  ? still uses electronic cig/ emphysema, SOB easily  ? Vertigo   ? per wifes update  ? Wears partial dentures   ? upper  ? ? ?ALLERGIES:  is allergic to ativan [lorazepam], doxycycline, and tramadol. ? ?MEDICATIONS:  ?Current Outpatient Medications  ?Medication Sig Dispense Refill  ? acetaminophen (TYLENOL) 500 MG tablet Take 1,000 mg by mouth at bedtime as needed for moderate pain.    ? Cholecalciferol (VITAMIN D) 125 MCG (5000 UT) CAPS Take 5,000 Units by mouth 3 (three) times a week.    ? citalopram (CELEXA) 20 MG tablet Take 1 tablet by mouth daily.    ? Fluticasone-Salmeterol (ADVAIR) 100-50 MCG/DOSE AEPB Inhale 2 puffs into the lungs 2 (two) times daily. (Patient not taking: Reported on 04/25/2021)    ? lidocaine-prilocaine (EMLA) cream Apply  1 application topically as needed. Apply 1 tsp over port site at least 45 minutes prior to lab appointment.Do not rub in cream. Cover with plastic wrap. 30 g 0  ? Multiple Vitamin (MULTIVITAMIN) tablet Take 1 tablet by mouth daily.    ? valsartan-hydrochlorothiazide (DIOVAN-HCT) 80-12.5 MG tablet Take 1 tablet by mouth daily.    ? vitamin B-12 (CYANOCOBALAMIN) 500 MCG tablet 1 tablet    ? ?No current facility-administered medications for this visit.  ? ? ?SURGICAL HISTORY:  ?Past Surgical History:  ?Procedure Laterality Date  ? CATARACT EXTRACTION W/PHACO Right 07/08/2018  ? Procedure: CATARACT EXTRACTION  PHACO AND INTRAOCULAR LENS PLACEMENT ([facility]) RIGHT;  Surgeon: Leandrew Koyanagi, MD;  Location: [facility];  Service: Ophthalmology;  Laterality: Right;  ? CATARACT EXTRACTION W/PHACO Left 07/29/2018  ? Procedure: CATARACT EXTRACTION PHACO AND INTRAOCULAR LENS PLACEMENT ([facility]) LEFT TORIC LENS;  Surgeon: Leandrew Koyanagi, MD;  Location: [facility];  Service: Ophthalmology;  Laterality: Left;  ? CHEST TUBE INSERTION Right 07/01/2019  ? Procedure: CHEST TUBE INSERTION;  Surgeon: [name], MD;  Location: [facility];  Service: Thoracic;  Laterality: Right;  ? COLONOSCOPY    ? EYE SURGERY    ? INTERCOSTAL NERVE BLOCK Right 06/17/2019  ? Procedure: INTERCOSTAL NERVE BLOCK;  Surgeon: [name], MD;  Location: [facility];  Service: Thoracic;  Laterality: Right;  ? INTERCOSTAL NERVE BLOCK Right 07/01/2019  ? Procedure: INTERCOSTAL NERVE BLOCK;  Surgeon: [name], MD;  Location: [facility];  Service: Thoracic;  Laterality: Right;  ? IR IMAGING GUIDED PORT INSERTION  02/21/2020  ? TONSILLECTOMY    ? VIDEO ASSISTED THORACOSCOPY Right 07/01/2019  ? Procedure: VIDEO ASSISTED THORACOSCOPY - REMOVAL OF RIGHT MIDDLE LOBE BLEBS;  Surgeon: [name], MD;  Location: [facility];  Service: Thoracic;  Laterality: Right;  ? VIDEO BRONCHOSCOPY WITH INSERTION OF INTERBRONCHIAL VALVE (IBV) Right 06/25/2019  ? Procedure: VIDEO BRONCHOSCOPY WITH INSERTION OF INTERBRONCHIAL VALVE (IBV); Size 7 IBV into Right Loer Lobe (RLL) Superior Segment, Size 9 IBV into RLL Basilar Segment;  Surgeon: [name], MD;  Location: [facility];  Service: Thoracic;  Laterality: Right;  ? VIDEO BRONCHOSCOPY WITH INSERTION OF INTERBRONCHIAL VALVE (IBV) N/A 08/06/2019  ? Procedure: VIDEO BRONCHOSCOPY WITH REMOVAL OF INTERBRONCHIAL VALVE (IBV);  Surgeon: [name], MD;  Location: [facility];  Service: Thoracic;  Laterality: N/A;  ? ? ?REVIEW OF SYSTEMS:  Constitutional: positive for fatigue and weight loss ?Eyes:  negative ?Ears, nose, mouth, throat, and face: negative ?Respiratory: negative ?Cardiovascular: negative ?Gastrointestinal: negative ?Genitourinary:negative ?Integument/breast: negative ?Hematologic/lymphatic: negative ?Musculoskeletal:negative ?Neurological: negative ?Behavioral/Psych: negative ?Endocrine: negative ?Allergic/Immunologic: negative  ? ?PHYSICAL EXAMINATION: General appearance: alert, cooperative, fatigued, and no distress ?Head: Normocephalic, without obvious abnormality, atraumatic ?Neck: no adenopathy, no JVD, supple, symmetrical, trachea midline, and thyroid not enlarged, symmetric, no tenderness/mass/nodules ?Lymph nodes: Cervical, supraclavicular, and axillary nodes normal. ?Resp: clear to auscultation bilaterally ?Back: symmetric, no curvature. ROM normal. No CVA tenderness. ?Cardio: regular rate and rhythm, S1, S2 normal, no murmur, click, rub or gallop ?GI: soft, non-tender; bowel sounds normal; no masses,  no organomegaly ?Extremities: extremities normal, atraumatic, no cyanosis or edema ?Neurologic: Alert and oriented X 3, normal strength and tone. Normal symmetric reflexes. Normal coordination and gait ? ?ECOG PERFORMANCE STATUS: 0 - Asymptomatic ? ?Blood pressure 134/84, pulse (!) 103, temperature (!) 97.2 ?F (36.2 ?C), temperature source Tympanic, resp. rate 18, height _0  (1.676 m), weight 141 lb 11.2 oz (64.3 kg), SpO2 100 %. ? ?  LABORATORY DATA: ?Lab Results  ?Component Value Date  ? WBC 10.7 (H) 04/25/2021  ? HGB 16.4 04/25/2021  ? HCT 44.6 04/25/2021  ? MCV 83.8 04/25/2021  ? PLT 191 04/25/2021  ? ? ?  Chemistry   ?   ?Component Value Date/Time  ? NA 135 04/05/2021 0935  ? K 4.3 04/05/2021 0935  ? CL 101 04/05/2021 0935  ? CO2 28 04/05/2021 0935  ? BUN 19 04/05/2021 0935  ? CREATININE 1.25 (H) 04/05/2021 0935  ?    ?Component Value Date/Time  ? CALCIUM 9.3 04/05/2021 0935  ? ALKPHOS 51 04/05/2021 0935  ? AST 13 (L) 04/05/2021 0935  ? ALT 13 04/05/2021 0935  ? BILITOT 0.7 04/05/2021  0935  ?  ? ? ? ?RADIOGRAPHIC STUDIES: ?CT CHEST ABDOMEN PELVIS W CONTRAST ? ?Result Date: 04/04/2021 ?CLINICAL DATA:  Primary Cancer Type: Lung * Tracking Code: BO * Imaging Indication: Assess response to th

## 2021-04-26 ENCOUNTER — Ambulatory Visit (INDEPENDENT_AMBULATORY_CARE_PROVIDER_SITE_OTHER): Payer: Medicare Other | Admitting: Internal Medicine

## 2021-04-26 ENCOUNTER — Encounter: Payer: Self-pay | Admitting: *Deleted

## 2021-04-26 ENCOUNTER — Encounter: Payer: Self-pay | Admitting: Internal Medicine

## 2021-04-26 VITALS — BP 118/74 | HR 94 | Ht 66.0 in | Wt 141.0 lb

## 2021-04-26 DIAGNOSIS — E1042 Type 1 diabetes mellitus with diabetic polyneuropathy: Secondary | ICD-10-CM | POA: Diagnosis not present

## 2021-04-26 DIAGNOSIS — E1065 Type 1 diabetes mellitus with hyperglycemia: Secondary | ICD-10-CM | POA: Diagnosis not present

## 2021-04-26 DIAGNOSIS — R739 Hyperglycemia, unspecified: Secondary | ICD-10-CM

## 2021-04-26 LAB — POCT GLYCOSYLATED HEMOGLOBIN (HGB A1C): Hemoglobin A1C: 9.8 % — AB (ref 4.0–5.6)

## 2021-04-26 LAB — POCT GLUCOSE (DEVICE FOR HOME USE): Glucose Fasting, POC: 319 mg/dL — AB (ref 70–99)

## 2021-04-26 MED ORDER — DEXCOM G6 SENSOR MISC: 1.0000 | 3 refills | Status: DC

## 2021-04-26 MED ORDER — NOVOLOG FLEXPEN 100 UNIT/ML ~~LOC~~ SOPN: PEN_INJECTOR | SUBCUTANEOUS | 11 refills | Status: DC

## 2021-04-26 MED ORDER — DEXCOM G6 TRANSMITTER MISC: 1.0000 | 3 refills | Status: DC

## 2021-04-26 MED ORDER — LANTUS SOLOSTAR 100 UNIT/ML ~~LOC~~ SOPN: 10.0000 [IU] | PEN_INJECTOR | Freq: Every day | SUBCUTANEOUS | 6 refills | Status: DC

## 2021-04-26 MED ORDER — INSULIN PEN NEEDLE 32G X 4 MM MISC: 1.0000 | Freq: Four times a day (QID) | 3 refills | Status: DC

## 2021-04-26 NOTE — Progress Notes (Signed)
?Name: Luis [name]  ?MRN/ DOB: [id number], [birth year]   ?Age/ Sex: 71 y.o., male   ? ?PCP: [name], MD   ?Reason for Endocrinology Evaluation: Type 1 Diabetes Mellitus  ?   ?Date of Initial Endocrinology Visit: 04/26/2021   ? ? ?PATIENT IDENTIFIER: Luis [name] is a 71 y.o. male with a past medical history of T1DM, metastatic non-small cell lung cancer (Dx 2021) S/P right upper lobectomy. The patient presented for initial endocrinology clinic visit on 04/26/2021 for consultative assistance with his diabetes management.  ? ? ?HPI: ?Mr. Abboud was diagnosed with metastatic non-small cell lung cancer in 2021.  He is S/P right upper lobectomy in 06/2019, as well as initial adjuvant systemic chemotherapy with cisplatin and Alimta which was discontinued due to intolerance.  He is currently on immunotherapy Cempilimab every 3 weeks ? ?During his follow-up with his oncologist on 04/25/2021 , he was having symptomatic hyperglycemia with polyuria and polydipsia.  Serum glucose was 500 mg/DL ?He saw his PCP yesterday and was provided with Novolog pen but pt was unable to use ,due to lack of pen needles  ? ? ? ?HOME DIABETES REGIMEN: ?Novolog- Not taking  ? ? ?Statin: no ?ACE-I/ARB: yes ?Prior Diabetic Education: no ? ? ?METER DOWNLOAD SUMMARY: did not bring ? ? ?DIABETIC COMPLICATIONS: ?Microvascular complications:  ?Chemo induced neuropathy ?Denies: CKD ?Last eye exam: needs one   ? ?Macrovascular complications:  ? ? ?Denies: CAD, PVD, CVA ? ? ?PAST HISTORY: ?Past Medical History:  ?Past Medical History:  ?Diagnosis Date  ? Arthritis   ? through out body  ? Chronic bronchitis ([facility])   ? Concussion   ? COPD (chronic obstructive pulmonary disease) ([facility])   ? Dyspnea   ? Emphysema lung ([facility])   ? Fatigue   ? Frozen shoulder   ? LEFT  ? GERD (gastroesophageal reflux disease)   ? Hard of hearing   ? Headache   ? alot of days, related to spine issues  ? Hypertension   ? Lumbar radiculopathy 2013  ? nscl ca 04/2019  ?  Peyronie's disease   ? Prostatitis 2011  ? Dr. Gaynelle Arabian  ? RLS (restless legs syndrome)   ? Shingles 06/13/2018  ? shingles on back, finished with prednisone, stills has scabs and pain  ? Thigh pain   ? Bilateral Thigh  ? Thigh pain 10/2011  ? bilateral  ? Tobacco abuse 2010  ? still uses electronic cig/ emphysema, SOB easily  ? Vertigo   ? per wifes update  ? Wears partial dentures   ? upper  ? ?Past Surgical History:  ?Past Surgical History:  ?Procedure Laterality Date  ? CATARACT EXTRACTION W/PHACO Right 07/08/2018  ? Procedure: CATARACT EXTRACTION PHACO AND INTRAOCULAR LENS PLACEMENT ([facility]) RIGHT;  Surgeon: Leandrew Koyanagi, MD;  Location: [facility];  Service: Ophthalmology;  Laterality: Right;  ? CATARACT EXTRACTION W/PHACO Left 07/29/2018  ? Procedure: CATARACT EXTRACTION PHACO AND INTRAOCULAR LENS PLACEMENT ([facility]) LEFT TORIC LENS;  Surgeon: Leandrew Koyanagi, MD;  Location: [facility];  Service: Ophthalmology;  Laterality: Left;  ? CHEST TUBE INSERTION Right 07/01/2019  ? Procedure: CHEST TUBE INSERTION;  Surgeon: [name], MD;  Location: [facility];  Service: Thoracic;  Laterality: Right;  ? COLONOSCOPY    ? EYE SURGERY    ? INTERCOSTAL NERVE BLOCK Right 06/17/2019  ? Procedure: INTERCOSTAL NERVE BLOCK;  Surgeon: [name], MD;  Location: [facility];  Service: Thoracic;  Laterality: Right;  ? INTERCOSTAL NERVE  BLOCK Right 07/01/2019  ? Procedure: INTERCOSTAL NERVE BLOCK;  Surgeon: [name], MD;  Location: [facility];  Service: Thoracic;  Laterality: Right;  ? IR IMAGING GUIDED PORT INSERTION  02/21/2020  ? TONSILLECTOMY    ? VIDEO ASSISTED THORACOSCOPY Right 07/01/2019  ? Procedure: VIDEO ASSISTED THORACOSCOPY - REMOVAL OF RIGHT MIDDLE LOBE BLEBS;  Surgeon: [name], MD;  Location: [facility];  Service: Thoracic;  Laterality: Right;  ? VIDEO BRONCHOSCOPY WITH INSERTION OF INTERBRONCHIAL VALVE (IBV) Right 06/25/2019  ? Procedure: VIDEO BRONCHOSCOPY WITH INSERTION  OF INTERBRONCHIAL VALVE (IBV); Size 7 IBV into Right Loer Lobe (RLL) Superior Segment, Size 9 IBV into RLL Basilar Segment;  Surgeon: [name], MD;  Location: [facility];  Service: Thoracic;  Laterality: Right;  ? VIDEO BRONCHOSCOPY WITH INSERTION OF INTERBRONCHIAL VALVE (IBV) N/A 08/06/2019  ? Procedure: VIDEO BRONCHOSCOPY WITH REMOVAL OF INTERBRONCHIAL VALVE (IBV);  Surgeon: [name], MD;  Location: [facility];  Service: Thoracic;  Laterality: N/A;  ?  ?Social History:  reports that he quit smoking about 12 years ago. His smoking use included cigarettes. He has a 80.00 pack-year smoking history. He has quit using smokeless tobacco. He reports current alcohol use of about 12.0 standard drinks per week. He reports that he does not use drugs. ?Family History:  ?Family History  ?Problem Relation Age of Onset  ? Diabetes Mother   ? Hyperlipidemia Father   ? Hypertension Father   ? ? ? ?HOME MEDICATIONS: ?Allergies as of 04/26/2021   ? ?   Reactions  ? Ativan [lorazepam] Other (See Comments)  ? hallucinations  ? Doxycycline Itching, Other (See Comments)  ? Itching/ redness  ? Tramadol Other (See Comments)  ? Hallucination  ? ?  ? ?  ?Medication List  ?  ? ?  ? Accurate as of April 26, 2021 11:27 AM. If you have any questions, ask your nurse or doctor.  ?  ?  ? ?  ? ?acetaminophen 500 MG tablet ?Commonly known as: TYLENOL ?Take 1,000 mg by mouth at bedtime as needed for moderate pain. ?  ?citalopram 20 MG tablet ?Commonly known as: CELEXA ?Take 1 tablet by mouth daily. ?  ?Fluticasone-Salmeterol 100-50 MCG/DOSE Aepb ?Commonly known as: ADVAIR ?Inhale 2 puffs into the lungs 2 (two) times daily. ?  ?lidocaine-prilocaine cream ?Commonly known as: EMLA ?Apply 1 application topically as needed. Apply 1 tsp over port site at least 45 minutes prior to lab appointment.Do not rub in cream. Cover with plastic wrap. ?  ?multivitamin tablet ?Take 1 tablet by mouth daily. ?  ?valsartan-hydrochlorothiazide 80-12.5 MG  tablet ?Commonly known as: DIOVAN-HCT ?Take 1 tablet by mouth daily. ?  ?vitamin B-12 500 MCG tablet ?Commonly known as: CYANOCOBALAMIN ?1 tablet ?  ?Vitamin D 125 MCG (5000 UT) Caps ?Take 5,000 Units by mouth 3 (three) times a week. ?  ? ?  ? ? ? ?ALLERGIES: ?Allergies  ?Allergen Reactions  ? Ativan [Lorazepam] Other (See Comments)  ?  hallucinations  ? Doxycycline Itching and Other (See Comments)  ?  Itching/ redness  ? Tramadol Other (See Comments)  ?  Hallucination  ? ? ? ?REVIEW OF SYSTEMS: ?A comprehensive ROS was conducted with the patient and is negative except as per HPI and below:  ?Review of Systems  ?Gastrointestinal:  Negative for constipation, diarrhea, nausea and vomiting.  ?Genitourinary:  Positive for frequency.  ?Endo/Heme/Allergies:  Positive for polydipsia.  ? ?  ?OBJECTIVE:  ? ?VITAL SIGNS: BP 118/74 (BP  Location: Left Arm, Patient Position: Sitting, Cuff Size: Small)   Pulse 94   Ht 5\' 6"  (1.676 m)   Wt 141 lb (64 kg)   SpO2 98%   BMI 22.76 kg/m?   ? ?PHYSICAL EXAM:  ?General: Pt appears well and is in NAD  ?Neck: General: Supple without adenopathy or carotid bruits. ?Thyroid: Thyroid size normal.  No goiter or nodules appreciated.   ?Lungs: Clear with good BS bilat with no rales, rhonchi, or wheezes  ?Heart: RRR with normal S1 and S2 and no gallops; no murmurs; no rub  ?Abdomen: Normoactive bowel sounds, soft, nontender, without masses or organomegaly palpable  ?Extremities:  ?Lower extremities - No pretibial edema. No lesions.  ?Neuro: MS is good with appropriate affect, pt is alert and Ox3  ? ? ?DM foot exam: 04/26/2021 ? ?The skin of the feet is intact without sores or ulcerations. ?The pedal pulses are 2+ on right and 2+ on left. ?The sensation is intact to a screening 5.07, 10 gram monofilament bilaterally ? ? ? ?DATA REVIEWED: ? ?Lab Results  ?Component Value Date  ? HGBA1C 9.8 (A) 04/26/2021  ? ?Lab Results  ?Component Value Date  ? CREATININE 1.42 (H) 04/25/2021  ? ? Latest  Reference Range & Units 04/25/21 10:18  ?Sodium 135 - 145 mmol/L 129 (L)  ?Potassium 3.5 - 5.1 mmol/L 4.6  ?Chloride 98 - 111 mmol/L 93 (L)  ?CO2 22 - 32 mmol/L 23  ?Glucose 70 - 99 mg/dL 500 (H)  ?BUN 8 - 23 mg/dL 24 (H

## 2021-04-26 NOTE — Patient Instructions (Signed)
-   Start Lantus ( long acting insulin ) 10 units ONCE daily  ?- Start Novolog 4 units with each meal  ?-Novolog correctional insulin: ADD extra units on insulin to your meal-time Novolog dose if your blood sugars are higher than . Use the scale below to help guide you:  ? ?Blood sugar before meal Number of units to inject  ?Less than 190 0 unit  ?191 -  250 1 units  ?251 -  310 2 units  ?311 -  370 3 units  ?371 -  430 4 units  ?431 -  490 5 units  ?491 -  550 6 units  ? ?HOW TO TREAT LOW BLOOD SUGARS (Blood sugar LESS THAN 70 MG/DL) ?Please follow the RULE OF 15 for the treatment of hypoglycemia treatment (when your (blood sugars are less than 70 mg/dL)  ? ?STEP 1: Take 15 grams of carbohydrates when your blood sugar is low, which includes:  ?3-4 GLUCOSE TABS  OR ?3-4 OZ OF JUICE OR REGULAR SODA OR ?ONE TUBE OF GLUCOSE GEL   ? ?STEP 2: RECHECK blood sugar in 15 MINUTES ?STEP 3: If your blood sugar is still low at the 15 minute recheck --> then, go back to STEP 1 and treat AGAIN with another 15 grams of carbohydrates. ? ?

## 2021-04-26 NOTE — Progress Notes (Signed)
Oncology Nurse Navigator Documentation ? ? ?  04/26/2021  ?  2:00 PM 04/25/2021  ? 11:00 AM  ?Oncology Nurse Navigator Flowsheets  ?Navigator Location [facility]  ?Navigator Encounter Type Other: Clinic/[facility]  ?Patient Visit Type Other   ?Treatment Phase Treatment   ?Barriers/Navigation Needs Coordination of Care Education;Coordination of Care  ?Education  Other  ?Interventions Coordination of Care/I followed up on [name]'s appt with endocrinology today.  He will be on insulin.  Will update Dr. Julien Nordmann Education;Psycho-Social Support;Coordination of Care  ?Acuity Level 2-Minimal Needs (1-2 Barriers Identified) Level 3-Moderate Needs (3-4 Barriers Identified)  ?Coordination of Care Other Other  ?Education Method  Verbal  ?Time Spent with Patient 15 60  ?  ?

## 2021-04-30 ENCOUNTER — Encounter: Payer: Medicare Other | Attending: Internal Medicine | Admitting: *Deleted

## 2021-04-30 ENCOUNTER — Encounter: Payer: Self-pay | Admitting: *Deleted

## 2021-04-30 VITALS — BP 128/76 | Ht 66.0 in | Wt 145.6 lb

## 2021-04-30 DIAGNOSIS — E099 Drug or chemical induced diabetes mellitus without complications: Secondary | ICD-10-CM

## 2021-04-30 DIAGNOSIS — E1042 Type 1 diabetes mellitus with diabetic polyneuropathy: Secondary | ICD-10-CM | POA: Insufficient documentation

## 2021-04-30 NOTE — Patient Instructions (Addendum)
Check blood sugars 4 x day before each meal and before bed every day ?Bring blood sugar records to the next appointment ? ?Exercise:  Begin walking  for    15  minutes   3  days a week and gradually increase to 30 minutes 5 x week ? ?Eat 3 meals day,   1-2  snacks a day ?Space meals 4-6 hours apart ?Continue to avoid sugar sweetened drinks unless treating a low blood sugar ? ?Complete 3 Day Food Record and bring to next appt ? ?Make an eye doctor appointment ? ?Carry fast acting glucose and a snack at all times ?Rotate injection sites ? ?Return for appointment on:   Call back when you receive your Dexcom to schedule an appointment ?

## 2021-04-30 NOTE — Progress Notes (Signed)
Diabetes Self-Management Education ? ?Visit Type: First/Initial ? ?Appt. Start Time: 1320 Appt. End Time: 9563 ? ?04/30/2021 ? ?Luis [name], identified by name and date of birth, is a 71 y.o. male with a diagnosis of Diabetes: Type 1 (drug induced - no labs drawn yet for C Peptide or antibodies).  ? ?ASSESSMENT ? ?Blood pressure 128/76, height 5\' 6"  (1.676 m), weight 145 lb 9.6 oz (66 kg). ?Body mass index is 23.5 kg/m?. ? ? Diabetes Self-Management Education - 04/30/21 1509   ? ?  ? Visit Information  ? Visit Type First/Initial   ?  ? Initial Visit  ? Diabetes Type Type 1   drug induced - no labs drawn yet for C Peptide or antibodies  ? Are you currently following a meal plan? Yes   ? What type of meal plan do you follow? "decrease carb and sugar"   ? Are you taking your medications as prescribed? Yes   ? Date Diagnosed 2 weeks   ?  ? Health Coping  ? How would you rate your overall health? Good   ?  ? Psychosocial Assessment  ? Patient Belief/Attitude about Diabetes Other (comment)   "I don't care" He also reports he has felt better in years and is glad that he was diagnosed and put on insulin.  ? Self-care barriers Hard of hearing   ? Self-management support Doctor's office;Family   ? Other persons present Spouse/SO   ? Patient Concerns Nutrition/Meal planning;Glycemic Control;Monitoring;Healthy Lifestyle   ? Special Needs None   ? Preferred Learning Style Auditory;Hands on   ? Learning Readiness Change in progress   ? How often do you need to have someone help you when you read instructions, pamphlets, or other written materials from your doctor or pharmacy? 2 - Rarely   ? What is the last grade level you completed in school? 12th   ?  ? Pre-Education Assessment  ? Patient understands the diabetes disease and treatment process. Needs Instruction   ? Patient understands incorporating nutritional management into lifestyle. Needs Instruction   ? Patient undertands incorporating physical activity into lifestyle.  Needs Instruction   ? Patient understands using medications safely. Needs Review   ? Patient understands monitoring blood glucose, interpreting and using results Needs Review   ? Patient understands prevention, detection, and treatment of acute complications. Needs Review   ? Patient understands prevention, detection, and treatment of chronic complications. Needs Instruction   ? Patient understands how to develop strategies to address psychosocial issues. Needs Instruction   ? Patient understands how to develop strategies to promote health/change behavior. Needs Instruction   ?  ? Complications  ? Last HgB A1C per patient/outside source 9.8 %   04/26/2021  ? How often do you check your blood sugar? 3-4 times/day   ? Fasting Blood glucose range (mg/dL) 130-179;180-200;>200   He reports FBG's 171 today and has been as high as 225 mg/dL. He reports readings before lunch and supper 130's mg/dL and last night at bedtime 120's mg/dL  ? Number of hypoglycemic episodes per month 1   Reading of 70 before supper on 4/15  ? Can you tell when your blood sugar is low? Yes   ? What do you do if your blood sugar is low? chewed up 1 glucose tablet and ate chips   He reports reading of 171 mg/dL after treatment.  ? Have you had a dilated eye exam in the past 12 months? No   ? Have you  had a dental exam in the past 12 months? No   ? Are you checking your feet? No   ?  ? Dietary Intake  ? Breakfast 2 eggs, sausage, toast with butter and jelly   ? Snack (morning) he reports no snacks   ? Lunch chicken salad, green beans, pinto beans, hamburger   ? Dinner chicken, beef, pork, fish, [place] hot dogs; potatoes, beans, corn, rice, pasta, green beans - reports "picky"   ? Beverage(s) water, cofee, diet soda, Gatorade zero   ?  ? Exercise  ? Exercise Type ADL's   ?  ? Patient Education  ? Previous Diabetes Education No   has been getting education from son - patient's grandson was dx with Type 1 at age 77  ? Disease state  Definition of  diabetes, type 1 and 2, and the diagnosis of diabetes;Factors that contribute to the development of diabetes;Explored patient's options for treatment of their diabetes   ? Nutrition management  Role of diet in the treatment of diabetes and the relationship between the three main macronutrients and blood glucose level;Food label reading, portion sizes and measuring food.;Carbohydrate counting;Reviewed blood glucose goals for pre and post meals and how to evaluate the patients' food intake on their blood glucose level.;Meal timing in regards to the patients' current diabetes medication.;Effects of alcohol on blood glucose and safety factors with consumption of alcohol.   ? Physical activity and exercise  Role of exercise on diabetes management, blood pressure control and cardiac health.   ? Medications Taught/reviewed insulin injection, site rotation, insulin storage and needle disposal.;Reviewed patients medication for diabetes, action, purpose, timing of dose and side effects.   ? Monitoring Purpose and frequency of SMBG.;Taught/discussed recording of test results and interpretation of SMBG.;Identified appropriate SMBG and/or A1C goals.;Yearly dilated eye exam;Other (comment)   Discussed use of Dexcom GCM. They received a call during this appointment from [facility] rep. He will be receiving at home once insurance and mail order medical supply has received information.  ? Acute complications Taught treatment of hypoglycemia - the 15 rule.   ? Chronic complications Relationship between chronic complications and blood glucose control   ? Psychosocial adjustment Role of stress on diabetes;Identified and addressed patients feelings and concerns about diabetes   ?  ? Individualized Goals (developed by patient)  ? Reducing Risk Other (comment)   improve blood sugars, prevent diabetes complications, lead a healthier lifestyle  ?  ? Outcomes  ? Expected Outcomes Demonstrated interest in learning. Expect positive outcomes   ?  Future DMSE --   Pt/wife will call once they receive Dexcom for follow up appointment to insert CGM and more carbohydrate counting.  ? ?  ?  ? ?Individualized Plan for Diabetes Self-Management Training:  ? ?Learning Objective:  Patient will have a greater understanding of diabetes self-management. ?Patient education plan is to attend individual and/or group sessions per assessed needs and concerns. ?  ?Plan:  ? ?Patient Instructions  ?Check blood sugars 4 x day before each meal and before bed every day ?Bring blood sugar records to the next appointment ? ?Exercise:  Begin walking  for    15  minutes   3  days a week and gradually increase to 30 minutes 5 x week ? ?Eat 3 meals day,   1-2  snacks a day ?Space meals 4-6 hours apart ?Continue to avoid sugar sweetened drinks unless treating a low blood sugar ? ?Make an eye doctor appointment ? ?Carry fast acting glucose and  a snack at all times ?Rotate injection sites ? ?Return for appointment on:   Call back when you receive your Dexcom to schedule an appointment ? ?Expected Outcomes:  Demonstrated interest in learning. Expect positive outcomes ? ?Education material provided:  ?[employer] Guidelines ?Simple Meal Plan ?Symptoms, causes and treatments of Hypoglycemia ?Healthy Snack Choices (ADA) ?Making Choices Using Food Labels (ADA) ?After Visit Summary (Dexcom) ? ?If problems or questions, patient to contact team via:   ?Johny Drilling, RN, [place], [place] [phone] ? ?Future DSME appointment:  (Pt/wife will call once they receive Dexcom for follow up appointment for CGM instruction and more carbohydrate counting.) ?

## 2021-05-04 ENCOUNTER — Encounter: Payer: Self-pay | Admitting: Internal Medicine

## 2021-05-08 ENCOUNTER — Ambulatory Visit: Payer: Medicare Other | Admitting: *Deleted

## 2021-05-10 ENCOUNTER — Encounter: Payer: Medicare Other | Admitting: *Deleted

## 2021-05-10 ENCOUNTER — Encounter: Payer: Self-pay | Admitting: *Deleted

## 2021-05-10 VITALS — Wt 145.4 lb

## 2021-05-10 DIAGNOSIS — E1042 Type 1 diabetes mellitus with diabetic polyneuropathy: Secondary | ICD-10-CM | POA: Diagnosis not present

## 2021-05-10 DIAGNOSIS — E099 Drug or chemical induced diabetes mellitus without complications: Secondary | ICD-10-CM

## 2021-05-10 NOTE — Patient Instructions (Addendum)
Check blood sugars 4 x day before each meal and before bed every day and as needed with Dexcom CGM ? ?Exercise:  Begin walking 15 minutes   3 days a week and gradually increase to 30 minutes 5 x week ? ?Eat 3 meals day,   1  snack a day ?Space meals 4-6 hours apart ?Limit desserts/sweets ? ?Carry fast acting glucose and a snack at all times ?Rotate injection sites  ? ?Call back if you want to schedule an appointment with the nurse or dietitian ?

## 2021-05-10 NOTE — Progress Notes (Signed)
Diabetes Self-Management Education ? ?Visit Type: Follow-up ? ?Appt. Start Time: 1020 Appt. End Time: 6270 ? ?05/10/2021 ? ?[name], identified by name and date of birth, is a 71 y.o. male with a diagnosis of Diabetes: Type 1.  ? ?ASSESSMENT ? ?Weight 145 lb 6.4 oz (66 kg). ?Body mass index is 23.47 kg/m?. ? ? Diabetes Self-Management Education - 05/10/21 1411   ? ?  ? Visit Information  ? Visit Type Follow-up   ?  ? Initial Visit  ? Diabetes Type Type 1   ?  ? Complications  ? How often do you check your blood sugar? > 4 times/day   ? Fasting Blood glucose range (mg/dL) 180-200;130-179;>200   see BG records - FBG's 135-244 mg/dL  ? Number of hypoglycemic episodes per month 0   ? Have you had a dilated eye exam in the past 12 months? No   appt scheduled for 05/15/21  ? Have you had a dental exam in the past 12 months? No   ? Are you checking your feet? No   ?  ? Dietary Intake  ? Breakfast 3 meals and no snacks   ?  ? Activity / Exercise  ? Activity / Exercise Type ADL's   ?  ? Patient Education  ? Disease Pathophysiology Definition of diabetes, type 1 and 2, and the diagnosis of diabetes;Factors that contribute to the development of diabetes;Explored patient's options for treatment of their diabetes   ? Healthy Eating Role of diet in the treatment of diabetes and the relationship between the three main macronutrients and blood glucose level;Food label reading, portion sizes and measuring food.;Carbohydrate counting;Reviewed blood glucose goals for pre and post meals and how to evaluate the patients' food intake on their blood glucose level.;Meal timing in regards to the patients' current diabetes medication.;Effects of alcohol on blood glucose and safety factors with consumption of alcohol.   ? Being Active Role of exercise on diabetes management, blood pressure control and cardiac health.   ? Medications Taught/reviewed insulin/injectables, injection, site rotation, insulin/injectables storage and needle  disposal.;Reviewed patients medication for diabetes, action, purpose, timing of dose and side effects.   ? Monitoring Taught/evaluated CGM (comment);Purpose and frequency of SMBG.;Taught/discussed recording of test results and interpretation of SMBG.;Identified appropriate SMBG and/or A1C goals.   Pt brought his Dexcom G6 to the office and was instructed on use. Sensor inserted to right abdomen.  ? Acute complications Taught prevention, symptoms, and  treatment of hypoglycemia - the 15 rule.   ? Chronic complications Relationship between chronic complications and blood glucose control   ? Diabetes Stress and Support Identified and addressed patients feelings and concerns about diabetes   ?  ? Individualized Goals (developed by patient)  ? Nutrition Carb counting   ? Physical Activity Exercise 3-5 times per week;30 minutes per day   ? Medications take my medication as prescribed   ? Monitoring  Consistenly use CGM   ? Problem Solving Eating Pattern   ? Reducing Risk examine blood glucose patterns;treat hypoglycemia with 15 grams of carbs if blood glucose less than 70mg /dL;do foot checks daily   ?  ? Post-Education Assessment  ? Patient understands the diabetes disease and treatment process. Demonstrates understanding / competency   ? Patient understands incorporating nutritional management into lifestyle. Demonstrates understanding / competency   ? Patient undertands incorporating physical activity into lifestyle. Needs Review   ? Patient understands using medications safely. Demonstrates understanding / competency   ? Patient understands monitoring blood  glucose, interpreting and using results Needs Review   when he starts using CGM  ? Patient understands prevention, detection, and treatment of acute complications. Comprehends key points   ? Patient understands prevention, detection, and treatment of chronic complications. Demonstrates understanding / competency   ? Patient understands how to develop strategies to  address psychosocial issues. Comprehends key points   ? Patient understands how to develop strategies to promote health/change behavior. Comprehends key points   ?  ? Outcomes  ? Expected Outcomes Demonstrated interest in learning. Expect positive outcomes   ? Program Status Completed   ?  ? Subsequent Visit  ? Since your last visit have you continued or begun to take your medications as prescribed? Yes   ? Since your last visit have you had your blood pressure checked? No   ? Since your last visit have you experienced any weight changes? No change   ? Since your last visit, are you checking your blood glucose at least once a day? Yes   ? ?  ?  ? ?Individualized Plan for Diabetes Self-Management Training:  ? ?Learning Objective:  Patient will have a greater understanding of diabetes self-management. ?Patient education plan is to attend individual and/or group sessions per assessed needs and concerns. ?  ?Plan:  ? ?Patient Instructions  ?Check blood sugars 4 x day before each meal and before bed every day and as needed with Dexcom CGM ? ?Exercise:  Begin walking 15 minutes   3 days a week and gradually increase to 30 minutes 5 x week ? ?Eat 3 meals day,   1  snack a day ?Space meals 4-6 hours apart ?Limit desserts/sweets ? ?Carry fast acting glucose and a snack at all times ?Rotate injection sites  ? ?Call back if you want to schedule an appointment with the nurse or dietitian ? ?Expected Outcomes:  Demonstrated interest in learning. Expect positive outcomes ? ?Education material provided:  ?5 Tips for Device Placement ? ?If problems or questions, patient to contact team via:   ?Johny Drilling, RN, [place], [place] [phone] ? ?Future DSME appointment: PRN ?

## 2021-05-11 ENCOUNTER — Telehealth: Payer: Self-pay | Admitting: *Deleted

## 2021-05-11 NOTE — Telephone Encounter (Signed)
Phone call from patient with questions about food, insulin and CGM. He reports that he ate 75 grams of carbs and took 7 units NovoLog for supper last night then he had a blood sugar of 49. He is using 1:10 ICR. Normally he only takes 2-6 units per meal. Explained that sometimes his body may use insulin differently depending on food, activity, etc and try not to exceed 5-6 units of NovoLog at meals. He may need to change his ICR if he continues to have low blood sugars. Then he over treated with cookies, 2 glucose tablets and orange juice. His blood sugar went up to 306. This morning his fasting was slightly higher at 175. Reviewed proper ways to treat a low blood sugar. He reports doing well with Dexcom. He will call back if he has other questions.  ?

## 2021-05-15 DIAGNOSIS — Z961 Presence of intraocular lens: Secondary | ICD-10-CM | POA: Diagnosis not present

## 2021-05-15 LAB — HM DIABETES EYE EXAM

## 2021-05-16 ENCOUNTER — Encounter: Payer: Self-pay | Admitting: Internal Medicine

## 2021-05-16 ENCOUNTER — Inpatient Hospital Stay: Payer: Medicare Other

## 2021-05-16 ENCOUNTER — Inpatient Hospital Stay: Payer: Medicare Other | Attending: Internal Medicine | Admitting: Internal Medicine

## 2021-05-16 ENCOUNTER — Other Ambulatory Visit: Payer: Self-pay

## 2021-05-16 VITALS — BP 124/79 | HR 91 | Temp 99.0°F | Resp 18 | Wt 144.4 lb

## 2021-05-16 DIAGNOSIS — C782 Secondary malignant neoplasm of pleura: Secondary | ICD-10-CM | POA: Diagnosis not present

## 2021-05-16 DIAGNOSIS — C3491 Malignant neoplasm of unspecified part of right bronchus or lung: Secondary | ICD-10-CM

## 2021-05-16 DIAGNOSIS — R252 Cramp and spasm: Secondary | ICD-10-CM | POA: Diagnosis not present

## 2021-05-16 DIAGNOSIS — J449 Chronic obstructive pulmonary disease, unspecified: Secondary | ICD-10-CM | POA: Diagnosis not present

## 2021-05-16 DIAGNOSIS — Z794 Long term (current) use of insulin: Secondary | ICD-10-CM | POA: Insufficient documentation

## 2021-05-16 DIAGNOSIS — E109 Type 1 diabetes mellitus without complications: Secondary | ICD-10-CM | POA: Insufficient documentation

## 2021-05-16 DIAGNOSIS — R5383 Other fatigue: Secondary | ICD-10-CM

## 2021-05-16 DIAGNOSIS — C3411 Malignant neoplasm of upper lobe, right bronchus or lung: Secondary | ICD-10-CM | POA: Insufficient documentation

## 2021-05-16 DIAGNOSIS — J910 Malignant pleural effusion: Secondary | ICD-10-CM | POA: Diagnosis not present

## 2021-05-16 DIAGNOSIS — Z5111 Encounter for antineoplastic chemotherapy: Secondary | ICD-10-CM

## 2021-05-16 DIAGNOSIS — Z5112 Encounter for antineoplastic immunotherapy: Secondary | ICD-10-CM | POA: Insufficient documentation

## 2021-05-16 DIAGNOSIS — E876 Hypokalemia: Secondary | ICD-10-CM | POA: Diagnosis not present

## 2021-05-16 DIAGNOSIS — Z902 Acquired absence of lung [part of]: Secondary | ICD-10-CM | POA: Diagnosis not present

## 2021-05-16 LAB — CBC WITH DIFFERENTIAL (CANCER CENTER ONLY)
Abs Immature Granulocytes: 0.02 10*3/uL (ref 0.00–0.07)
Basophils Absolute: 0.1 10*3/uL (ref 0.0–0.1)
Basophils Relative: 1 %
Eosinophils Absolute: 0.3 10*3/uL (ref 0.0–0.5)
Eosinophils Relative: 4 %
HCT: 42.6 % (ref 39.0–52.0)
Hemoglobin: 15.2 g/dL (ref 13.0–17.0)
Immature Granulocytes: 0 %
Lymphocytes Relative: 15 %
Lymphs Abs: 1 10*3/uL (ref 0.7–4.0)
MCH: 31 pg (ref 26.0–34.0)
MCHC: 35.7 g/dL (ref 30.0–36.0)
MCV: 86.9 fL (ref 80.0–100.0)
Monocytes Absolute: 0.8 10*3/uL (ref 0.1–1.0)
Monocytes Relative: 12 %
Neutro Abs: 4.8 10*3/uL (ref 1.7–7.7)
Neutrophils Relative %: 68 %
Platelet Count: 172 10*3/uL (ref 150–400)
RBC: 4.9 MIL/uL (ref 4.22–5.81)
RDW: 12.2 % (ref 11.5–15.5)
WBC Count: 6.9 10*3/uL (ref 4.0–10.5)
nRBC: 0 % (ref 0.0–0.2)

## 2021-05-16 LAB — CMP (CANCER CENTER ONLY)
ALT: 16 U/L (ref 0–44)
AST: 15 U/L (ref 15–41)
Albumin: 4 g/dL (ref 3.5–5.0)
Alkaline Phosphatase: 53 U/L (ref 38–126)
Anion gap: 8 (ref 5–15)
BUN: 21 mg/dL (ref 8–23)
CO2: 27 mmol/L (ref 22–32)
Calcium: 9 mg/dL (ref 8.9–10.3)
Chloride: 98 mmol/L (ref 98–111)
Creatinine: 1.29 mg/dL — ABNORMAL HIGH (ref 0.61–1.24)
GFR, Estimated: >60 mL/min (ref >60)
Glucose, Bld: 347 mg/dL — ABNORMAL HIGH (ref 70–99)
Potassium: 4.2 mmol/L (ref 3.5–5.1)
Sodium: 133 mmol/L — ABNORMAL LOW (ref 135–145)
Total Bilirubin: 0.8 mg/dL (ref 0.3–1.2)
Total Protein: 6.9 g/dL (ref 6.5–8.1)

## 2021-05-16 LAB — TSH: TSH: 1.524 u[IU]/mL (ref 0.350–4.500)

## 2021-05-16 MED ORDER — SODIUM CHLORIDE 0.9 % IV SOLN: Freq: Once | INTRAVENOUS | Status: AC

## 2021-05-16 MED ORDER — SODIUM CHLORIDE 0.9 % IV SOLN
350.0000 mg | Freq: Once | INTRAVENOUS | Status: AC
  Administered 2021-05-16: 350 mg via INTRAVENOUS
  Filled 2021-05-16: qty 7

## 2021-05-16 MED ORDER — SODIUM CHLORIDE 0.9% FLUSH
10.0000 mL | INTRAVENOUS | Status: DC | PRN
  Administered 2021-05-16: 10 mL

## 2021-05-16 MED ORDER — HEPARIN SOD (PORK) LOCK FLUSH 100 UNIT/ML IV SOLN
500.0000 [IU] | Freq: Once | INTRAVENOUS | Status: AC | PRN
  Administered 2021-05-16: 500 [IU]

## 2021-05-16 NOTE — Progress Notes (Signed)
?    [facility] ?[phone]   [phone] ? ?OFFICE PROGRESS NOTE ? ?Luis Huddle, MD ?[address] ?[place] [address] ? ?DIAGNOSIS:  ?1) Metastatic non-small cell lung cancer initially diagnosed as stage IIB (T1b, N1, M0) non-small cell lung cancer, invasive poorly differentiated carcinoma diagnosed in June 2021 with disease recurrence in November 2021 ?Molecular studies are negative for EGFR mutation as well as ALK gene translocation.  PD-L1 expression was 50-100%.  This is based on the report from the [facility] trial. ?Molecular studies by [facility]: Showed no actionable mutations and PD-L1 expression was 90% ?2) immunotherapy induced type 1 diabetes mellitus. ? ?PRIOR THERAPY:  ?1) Status post right upper lobectomy with lymph node dissection on June 17, 2019 under the care of Dr. Roxan Hockey. ?2) Adjuvant systemic chemotherapy with cisplatin 75 mg/M2 and Alimta 500 mg/M2 every 3 weeks.  First dose September 02, 2019. Status post 3 cycles.  His treatment was discontinued secondary to intolerance. ? ?CURRENT THERAPY: First-line treatment with immunotherapy with Libtayo (Cempilimab) 350 mg IV every 3 weeks status post 22 cycles. ? ?INTERVAL HISTORY: ?Luis [name] 71 y.o. male returns to the clinic today for follow-up visit.  The patient is feeling much better today with no concerning complaints.  He started treatment with insulin therapy after his diagnosis with immunotherapy mediated type 1 diabetes mellitus.  He was seen by Dr. Kelton Pillar with [facility] endocrinology and she is taking good care of his type 1 diabetes mellitus.  The patient is started gaining weight again.  He denied having any current chest pain, shortness of breath, cough or hemoptysis.  He has no nausea, vomiting, diarrhea or constipation.  He has no fever or chills.  He is here today for evaluation before starting cycle #23 of his treatment. ? ?MEDICAL HISTORY: ?Past Medical History:   ?Diagnosis Date  ? Arthritis   ? through out body  ? Chronic bronchitis ([place])   ? Concussion   ? COPD (chronic obstructive pulmonary disease) ([place])   ? Diabetes mellitus without complication ([place])   ? Dyspnea   ? Emphysema lung ([place])   ? Fatigue   ? Frozen shoulder   ? LEFT  ? GERD (gastroesophageal reflux disease)   ? Hard of hearing   ? Headache   ? alot of days, related to spine issues  ? Hypertension   ? Lumbar radiculopathy 2013  ? nscl ca 04/2019  ? Peyronie's disease   ? Prostatitis 2011  ? Dr. Gaynelle Arabian  ? RLS (restless legs syndrome)   ? Shingles 06/13/2018  ? shingles on back, finished with prednisone, stills has scabs and pain  ? Thigh pain   ? Bilateral Thigh  ? Thigh pain 10/2011  ? bilateral  ? Tobacco abuse 2010  ? still uses electronic cig/ emphysema, SOB easily  ? Vertigo   ? per wifes update  ? Wears partial dentures   ? upper  ? ? ?ALLERGIES:  is allergic to ativan [lorazepam], doxycycline, and tramadol. ? ?MEDICATIONS:  ?Current Outpatient Medications  ?Medication Sig Dispense Refill  ? acetaminophen (TYLENOL) 500 MG tablet Take 1,000 mg by mouth at bedtime as needed for moderate pain.    ? b complex vitamins capsule Take 1 capsule by mouth daily.    ? Cholecalciferol (VITAMIN D) 125 MCG (5000 UT) CAPS Take 5,000 Units by mouth daily.    ? citalopram (CELEXA) 20 MG tablet Take 1 tablet by mouth daily.    ?  Continuous Blood Gluc Sensor (DEXCOM G6 SENSOR) MISC 1 Device by Does not apply route as directed. 9 each 3  ? Continuous Blood Gluc Transmit (DEXCOM G6 TRANSMITTER) MISC 1 Device by Does not apply route as directed. 1 each 3  ? glucose blood (ACCU-CHEK GUIDE) test strip See admin instructions.    ? insulin aspart (NOVOLOG FLEXPEN) 100 UNIT/ML FlexPen Max daily 30 units (Patient taking differently: 2-6 Units 3 (three) times daily before meals. Usually takes 2-6 units before meals - based on 1:10 ICR) 15 mL 11  ? insulin glargine (LANTUS SOLOSTAR) 100 UNIT/ML Solostar Pen Inject 10 Units into  the skin daily. 15 mL 6  ? Insulin Pen Needle 32G X 4 MM MISC 1 Device by Does not apply route in the morning, at noon, in the evening, and at bedtime. 400 each 3  ? lidocaine-prilocaine (EMLA) cream Apply 1 application topically as needed. Apply 1 tsp over port site at least 45 minutes prior to lab appointment.Do not rub in cream. Cover with plastic wrap. 30 g 0  ? Multiple Vitamin (MULTIVITAMIN) tablet Take 1 tablet by mouth 2 (two) times a week.    ? valsartan-hydrochlorothiazide (DIOVAN-HCT) 80-12.5 MG tablet Take 1 tablet by mouth daily.    ? vitamin B-12 (CYANOCOBALAMIN) 500 MCG tablet Take 500 mcg by mouth daily.    ? ?No current facility-administered medications for this visit.  ? ? ?SURGICAL HISTORY:  ?Past Surgical History:  ?Procedure Laterality Date  ? CATARACT EXTRACTION W/PHACO Right 07/08/2018  ? Procedure: CATARACT EXTRACTION PHACO AND INTRAOCULAR LENS PLACEMENT ([place]) RIGHT;  Surgeon: Leandrew Koyanagi, MD;  Location: [facility];  Service: Ophthalmology;  Laterality: Right;  ? CATARACT EXTRACTION W/PHACO Left 07/29/2018  ? Procedure: CATARACT EXTRACTION PHACO AND INTRAOCULAR LENS PLACEMENT ([place]) LEFT TORIC LENS;  Surgeon: Leandrew Koyanagi, MD;  Location: [facility];  Service: Ophthalmology;  Laterality: Left;  ? CHEST TUBE INSERTION Right 07/01/2019  ? Procedure: CHEST TUBE INSERTION;  Surgeon: [name], MD;  Location: [facility];  Service: Thoracic;  Laterality: Right;  ? COLONOSCOPY    ? EYE SURGERY    ? INTERCOSTAL NERVE BLOCK Right 06/17/2019  ? Procedure: INTERCOSTAL NERVE BLOCK;  Surgeon: [name], MD;  Location: [facility];  Service: Thoracic;  Laterality: Right;  ? INTERCOSTAL NERVE BLOCK Right 07/01/2019  ? Procedure: INTERCOSTAL NERVE BLOCK;  Surgeon: [name], MD;  Location: [facility];  Service: Thoracic;  Laterality: Right;  ? IR IMAGING GUIDED PORT INSERTION  02/21/2020  ? TONSILLECTOMY    ? VIDEO ASSISTED THORACOSCOPY Right 07/01/2019  ?  Procedure: VIDEO ASSISTED THORACOSCOPY - REMOVAL OF RIGHT MIDDLE LOBE BLEBS;  Surgeon: [name], MD;  Location: [facility];  Service: Thoracic;  Laterality: Right;  ? VIDEO BRONCHOSCOPY WITH INSERTION OF INTERBRONCHIAL VALVE (IBV) Right 06/25/2019  ? Procedure: VIDEO BRONCHOSCOPY WITH INSERTION OF INTERBRONCHIAL VALVE (IBV); Size 7 IBV into Right Loer Lobe (RLL) Superior Segment, Size 9 IBV into RLL Basilar Segment;  Surgeon: [name], MD;  Location: [facility];  Service: Thoracic;  Laterality: Right;  ? VIDEO BRONCHOSCOPY WITH INSERTION OF INTERBRONCHIAL VALVE (IBV) N/A 08/06/2019  ? Procedure: VIDEO BRONCHOSCOPY WITH REMOVAL OF INTERBRONCHIAL VALVE (IBV);  Surgeon: [name], MD;  Location[facility] OR;  Service: Thoracic;  Laterality: N/A;  ? ? ?REVIEW OF SYSTEMS:  A comprehensive review of systems was negative.  ? ?PHYSICAL EXAMINATION: General appearance: alert, cooperative, and no distress ?Head: Normocephalic, without obvious abnormality, atraumatic ?Neck:  no adenopathy, no JVD, supple, symmetrical, trachea midline, and thyroid not enlarged, symmetric, no tenderness/mass/nodules ?Lymph nodes: Cervical, supraclavicular, and axillary nodes normal. ?Resp: clear to auscultation bilaterally ?Back: symmetric, no curvature. ROM normal. No CVA tenderness. ?Cardio: regular rate and rhythm, S1, S2 normal, no murmur, click, rub or gallop ?GI: soft, non-tender; bowel sounds normal; no masses,  no organomegaly ?Extremities: extremities normal, atraumatic, no cyanosis or edema ? ?ECOG PERFORMANCE STATUS: 0 - Asymptomatic ? ?Blood pressure 124/79, pulse 91, temperature 99 ?F (37.2 ?C), temperature source Tympanic, resp. rate 18, weight 144 lb 7 oz (65.5 kg), SpO2 95 %. ? ?LABORATORY DATA: ?Lab Results  ?Component Value Date  ? WBC 6.9 05/16/2021  ? HGB 15.2 05/16/2021  ? HCT 42.6 05/16/2021  ? MCV 86.9 05/16/2021  ? PLT 172 05/16/2021  ? ? ?  Chemistry   ?   ?Component Value Date/Time  ? NA 129 (L)  04/25/2021 1018  ? K 4.6 04/25/2021 1018  ? CL 93 (L) 04/25/2021 1018  ? CO2 23 04/25/2021 1018  ? BUN 24 (H) 04/25/2021 1018  ? CREATININE 1.42 (H) 04/25/2021 1018  ?    ?Component Value Date/Time  ? CALCIUM 9.4 04/1

## 2021-05-16 NOTE — Patient Instructions (Signed)
[facility]  Discharge Instructions: ?Thank you for choosing [facility] to provide your oncology and hematology care.  ? ?If you have a lab appointment with the [facility], please go directly to the [facility] and check in at the registration area. ?  ?Wear comfortable clothing and clothing appropriate for easy access to any Portacath or PICC line.  ? ?We strive to give you quality time with your provider. You may need to reschedule your appointment if you arrive late (15 or more minutes).  Arriving late affects you and other patients whose appointments are after yours.  Also, if you miss three or more appointments without notifying the office, you may be dismissed from the clinic at the provider?s discretion.    ?  ?For prescription refill requests, have your pharmacy contact our office and allow 72 hours for refills to be completed.   ? ?Today you received the following chemotherapy and/or immunotherapy agent: Libtayo    ?  ?To help prevent nausea and vomiting after your treatment, we encourage you to take your nausea medication as directed. ? ?BELOW ARE SYMPTOMS THAT SHOULD BE REPORTED IMMEDIATELY: ?*FEVER GREATER THAN 100.4 F (38 ?C) OR HIGHER ?*CHILLS OR SWEATING ?*NAUSEA AND VOMITING THAT IS NOT CONTROLLED WITH YOUR NAUSEA MEDICATION ?*UNUSUAL SHORTNESS OF BREATH ?*UNUSUAL BRUISING OR BLEEDING ?*URINARY PROBLEMS (pain or burning when urinating, or frequent urination) ?*BOWEL PROBLEMS (unusual diarrhea, constipation, pain near the anus) ?TENDERNESS IN MOUTH AND THROAT WITH OR WITHOUT PRESENCE OF ULCERS (sore throat, sores in mouth, or a toothache) ?UNUSUAL RASH, SWELLING OR PAIN  ?UNUSUAL VAGINAL DISCHARGE OR ITCHING  ? ?Items with * indicate a potential emergency and should be followed up as soon as possible or go to the Emergency Department if any problems should occur. ? ?Please show the CHEMOTHERAPY ALERT CARD or IMMUNOTHERAPY ALERT CARD at check-in to the  Emergency Department and triage nurse. ? ?Should you have questions after your visit or need to cancel or reschedule your appointment, please contact [facility]  Dept: [phone]  and follow the prompts.  Office hours are 8:00 a.m. to 4:30 p.m. Monday - Friday. Please note that voicemails left after 4:00 p.m. may not be returned until the following business day.  We are closed weekends and major holidays. You have access to a nurse at all times for urgent questions. Please call the main number to the clinic Dept: [phone] and follow the prompts. ? ? ?For any non-urgent questions, you may also contact your provider using MyChart. We now offer e-Visits for anyone 44 and older to request care online for non-urgent symptoms. For details visit [email or web address]. ?  ?Also download the MyChart app! Go to the app store, search "MyChart", open the app, select [facility], and log in with your MyChart username and password. ? ?Due to Covid, a mask is required upon entering the hospital/clinic. If you do not have a mask, one will be given to you upon arrival. For doctor visits, patients may have 1 support person aged 70 or older with them. For treatment visits, patients cannot have anyone with them due to current Covid guidelines and our immunocompromised population.  ? ?

## 2021-05-18 DIAGNOSIS — Z20822 Contact with and (suspected) exposure to covid-19: Secondary | ICD-10-CM | POA: Diagnosis not present

## 2021-05-23 ENCOUNTER — Telehealth: Payer: Self-pay | Admitting: *Deleted

## 2021-05-23 NOTE — Telephone Encounter (Signed)
Received voice mail from patient's wife that he was having with high blood sugars (300's) and hasn't changed his eating or insulin. They also had questions about reader for Dexcom and if it was accurate. Called [facility] and patient was also on speaker phone in the car. They report that the reader is not showing trend marks and there is a message about the sensor. They will replace the sensor today and see if it corrects the problem. Instructed them to contact Dexcom for continued problems. They did change the sensor after it was initially placed here in the office. They also report that when he checks it on the glucometer there is a 20-40 point difference. Explained that there is a little lag time with the sensor since it is picking up interstitial fluid and the readings will be different. He reports blood sugars 200's prior to meals and taking NovoLog insulin based on 1:10 ICR. Explained that he may have to lower the ICR or use the prior sliding scale by MD to increase insulin dosage before meals. Patient makes changes on his own. Also explained that many things can increase blood sugar due to the nature of Diabetes. They will change the sensor today and call back for further questions.  ?

## 2021-06-04 NOTE — Progress Notes (Unsigned)
[facility] OFFICE PROGRESS NOTE  Marden Noble, MD [address] [place] [address]  DIAGNOSIS: Metastatic non-small cell lung cancer initially diagnosed as stage IIB (T1b, N1, M0) non-small cell lung cancer, invasive poorly differentiated carcinoma diagnosed in June 2021 with disease recurrence in November 2021 Molecular studies are negative for EGFR mutation as well as ALK gene translocation.  PD-L1 expression was 50-100%.  This is based on the report from the [employer] trial. Molecular studies by [employer]: Showed no actionable mutations and PD-L1 expression was 90%  PRIOR THERAPY: 1) Status post right upper lobectomy with lymph node dissection on June 17, 2019 under the care of Dr. Dorris Fetch. 2) Adjuvant systemic chemotherapy with cisplatin 75 mg/M2 and Alimta 500 mg/M2 every 3 weeks.  First dose September 02, 2019. Status post 3 cycles.  His treatment was discontinued secondary to intolerance.  CURRENT THERAPY: First-line treatment with immunotherapy with Libtayo (Cempilimab) 350 mg IV every 3 weeks status post 23 cycles. First dose on 01/24/20.  INTERVAL HISTORY: Luis [name] 71 y.o. male returns to the clinic today for a follow-up visit.  The patient is feeling well today without any concerning complaints except he has noticed increased muscle cramps in his hands and feet the last few days which is unusual for him.  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  He is followed closely by endocrinology. He has a dexcom and checked his blood sugar while in the exam room today and it was 302. He reports he just ate. He has his insulin with him.   He is currently undergoing immunotherapy with Libtayo.  He is otherwise tolerating this well besides the recent development of diabetes.  He also sometimes has mild systemic itching without any rash for which she will use hydrocortisone cream if needed.  Denies any rashes or  itching today. He denies any fever, chills, or night sweats.  He had lost several pounds when he developed diabetes but is gaining it back.  He reports his baseline dyspnea exertion secondary to his COPD and baseline chest discomfort along his right chest wall from his prior surgery.  Denies any changes with his occasional cough.  Denies any hemoptysis.  Denies any nausea, vomiting, or constipation.  The patient sometimes has intermittent  loose stool which he has been prone to "all his life". He sometimes has headaches in the occipital region which is also been occurring for most of his life secondary to disc issues in his cervical spine.  He is here today for evaluation and repeat blood work before starting cycle #24.   MEDICAL HISTORY: Past Medical History:  Diagnosis Date   Arthritis    through out body   Chronic bronchitis (HCC)    Concussion    COPD (chronic obstructive pulmonary disease) (HCC)    Diabetes mellitus without complication (HCC)    Dyspnea    Emphysema lung (HCC)    Fatigue    Frozen shoulder    LEFT   GERD (gastroesophageal reflux disease)    Hard of hearing    Headache    alot of days, related to spine issues   Hypertension    Lumbar radiculopathy 2013   nscl ca 04/2019   Peyronie's disease    Prostatitis 2011   Dr. Patsi Sears   RLS (restless legs syndrome)    Shingles 06/13/2018   shingles on back, finished with prednisone, stills has scabs and pain  Thigh pain    Bilateral Thigh   Thigh pain 10/2011   bilateral   Tobacco abuse 2010   still uses electronic cig/ emphysema, SOB easily   Vertigo    per wifes update   Wears partial dentures    upper    ALLERGIES:  is allergic to ativan [lorazepam], doxycycline, and tramadol.  MEDICATIONS:  Current Outpatient Medications  Medication Sig Dispense Refill   acetaminophen (TYLENOL) 500 MG tablet Take 1,000 mg by mouth at bedtime as needed for moderate pain.     albuterol (VENTOLIN HFA) 108 (90 Base)  MCG/ACT inhaler INHALE 1-2 PUFFS BY ORAL INHALATION EVERY 4 HOURS FOR CHRONIC OBSTRUCTIVE LUNG DISEASE BE SURE TO WASH MOUTHPIECE WITH WARM WATER ONCE A WEEK     amLODipine (NORVASC) 5 MG tablet TAKE ONE-HALF TABLET BY MOUTH EVERY DAY FOR BLOOD PRESSURE. NOTE NEW MEDICINE     b complex vitamins capsule Take 1 capsule by mouth daily.     Cholecalciferol (VITAMIN D) 125 MCG (5000 UT) CAPS Take 5,000 Units by mouth daily.     citalopram (CELEXA) 20 MG tablet Take 1 tablet by mouth daily.     Continuous Blood Gluc Sensor (DEXCOM G6 SENSOR) MISC 1 Device by Does not apply route as directed. 9 each 3   Continuous Blood Gluc Transmit (DEXCOM G6 TRANSMITTER) MISC 1 Device by Does not apply route as directed. 1 each 3   fluticasone-salmeterol (ADVAIR) 250-50 MCG/ACT AEPB 1 puff     glucose blood (ACCU-CHEK GUIDE) test strip See admin instructions.     insulin aspart (NOVOLOG FLEXPEN) 100 UNIT/ML FlexPen Max daily 30 units (Patient taking differently: 2-6 Units 3 (three) times daily before meals. Usually takes 2-6 units before meals - based on 1:10 ICR) 15 mL 11   insulin glargine (LANTUS SOLOSTAR) 100 UNIT/ML Solostar Pen Inject 10 Units into the skin daily. 15 mL 6   Insulin Pen Needle 32G X 4 MM MISC 1 Device by Does not apply route in the morning, at noon, in the evening, and at bedtime. 400 each 3   lidocaine-prilocaine (EMLA) cream Apply 1 application topically as needed. Apply 1 tsp over port site at least 45 minutes prior to lab appointment.Do not rub in cream. Cover with plastic wrap. 30 g 0   losartan (COZAAR) 50 MG tablet TAKE ONE TABLET BY MOUTH EVERY DAY FOR HYPERTENSION     Multiple Vitamin (MULTIVITAMIN) tablet Take 1 tablet by mouth 2 (two) times a week.     vitamin B-12 (CYANOCOBALAMIN) 500 MCG tablet Take 500 mcg by mouth daily.     No current facility-administered medications for this visit.   Facility-Administered Medications Ordered in Other Visits  Medication Dose Route Frequency  Provider Last Rate Last Admin   cemiplimab-rwlc (LIBTAYO) 350 mg in sodium chloride 0.9 % 100 mL chemo infusion  350 mg Intravenous Once Curt Bears, MD       heparin lock flush 100 unit/mL  500 Units Intracatheter Once PRN Curt Bears, MD       sodium chloride flush (NS) 0.9 % injection 10 mL  10 mL Intracatheter PRN Curt Bears, MD        SURGICAL HISTORY:  Past Surgical History:  Procedure Laterality Date   CATARACT EXTRACTION W/PHACO Right 07/08/2018   Procedure: CATARACT EXTRACTION PHACO AND INTRAOCULAR LENS PLACEMENT ([facility]) RIGHT;  Surgeon: Leandrew Koyanagi, MD;  Location: [facility];  Service: Ophthalmology;  Laterality: Right;   CATARACT EXTRACTION W/PHACO Left 07/29/2018   Procedure:  CATARACT EXTRACTION PHACO AND INTRAOCULAR LENS PLACEMENT ([facility]) LEFT TORIC LENS;  Surgeon: Leandrew Koyanagi, MD;  Location: [facility];  Service: Ophthalmology;  Laterality: Left;   CHEST TUBE INSERTION Right 07/01/2019   Procedure: CHEST TUBE INSERTION;  Surgeon: [name], MD;  Location: [facility];  Service: Thoracic;  Laterality: Right;   COLONOSCOPY     EYE SURGERY     INTERCOSTAL NERVE BLOCK Right 06/17/2019   Procedure: INTERCOSTAL NERVE BLOCK;  Surgeon: [name], MD;  Location: [facility];  Service: Thoracic;  Laterality: Right;   INTERCOSTAL NERVE BLOCK Right 07/01/2019   Procedure: INTERCOSTAL NERVE BLOCK;  Surgeon: [name], MD;  Location: [facility];  Service: Thoracic;  Laterality: Right;   IR IMAGING GUIDED PORT INSERTION  02/21/2020   TONSILLECTOMY     VIDEO ASSISTED THORACOSCOPY Right 07/01/2019   Procedure: VIDEO ASSISTED THORACOSCOPY - REMOVAL OF RIGHT MIDDLE LOBE BLEBS;  Surgeon: [name], MD;  Location: [facility];  Service: Thoracic;  Laterality: Right;   VIDEO BRONCHOSCOPY WITH INSERTION OF INTERBRONCHIAL VALVE (IBV) Right 06/25/2019   Procedure: VIDEO BRONCHOSCOPY WITH INSERTION OF INTERBRONCHIAL VALVE (IBV); Size 7 IBV  into Right Loer Lobe (RLL) Superior Segment, Size 9 IBV into RLL Basilar Segment;  Surgeon: [name], MD;  Location: [facility];  Service: Thoracic;  Laterality: Right;   VIDEO BRONCHOSCOPY WITH INSERTION OF INTERBRONCHIAL VALVE (IBV) N/A 08/06/2019   Procedure: VIDEO BRONCHOSCOPY WITH REMOVAL OF INTERBRONCHIAL VALVE (IBV);  Surgeon: [name], MD;  Location: [facility] OR;  Service: Thoracic;  Laterality: N/A;    REVIEW OF SYSTEMS:   Review of Systems  Constitutional: Negative for appetite change, chills, fatigue, fever and unexpected weight change.  HENT:   Negative for mouth sores, nosebleeds, sore throat and trouble swallowing.   Eyes: Negative for eye problems and icterus.  Respiratory: Positive for SOB with exertion (unchanged). Positive for mild cough. Negative for hemoptysis and wheezing.   Cardiovascular: Negative for chest pain and leg swelling.  Gastrointestinal: Positive for intermittent baseline loose stool. Negative for abdominal pain, constipation, nausea and vomiting.  Genitourinary: Negative for bladder incontinence, difficulty urinating, dysuria, frequency and hematuria.   Musculoskeletal: Positive for occasional low back pain and pain in his cervical spine. Negative for gait problem and neck stiffness.  Skin: Negative for itching and rash.  Neurological: Negative for dizziness, extremity weakness, gait problem, headaches, light-headedness and seizures.  Hematological: Negative for adenopathy. Does not bruise/bleed easily.  Psychiatric/Behavioral: Negative for confusion, depression and sleep disturbance. The patient is not nervous/anxious.     PHYSICAL EXAMINATION:  Blood pressure 132/78, pulse 87, temperature 98.3 F (36.8 C), temperature source Tympanic, resp. rate 17, weight 144 lb 8 oz (65.5 kg), SpO2 96 %.  ECOG PERFORMANCE STATUS: 1  Physical Exam  Constitutional: Oriented to person, place, and time and well-developed, well-nourished, and in no distress.   HENT:  Head: Normocephalic and atraumatic.  Mouth/Throat: Oropharynx is clear and moist. No oropharyngeal exudate.  Eyes: Conjunctivae are normal. Right eye exhibits no discharge. Left eye exhibits no discharge. No scleral icterus.  Neck: Normal range of motion. Neck supple.  Cardiovascular: Normal rate, regular rhythm, normal heart sounds and intact distal pulses.   Pulmonary/Chest: Effort normal and breath sounds normal. No respiratory distress. No wheezes. No rales.  Abdominal: Soft. Bowel sounds are normal. Exhibits no distension and no mass. There is no tenderness.  Musculoskeletal: Normal range of motion. Exhibits no edema.  Lymphadenopathy:    No cervical  adenopathy.  Neurological: Alert and oriented to person, place, and time. Exhibits normal muscle tone. Gait normal. Coordination normal.  Skin: Positive for bruising on his upper extremities. Skin is warm and dry. No rash noted. Not diaphoretic. No erythema. No pallor.  Psychiatric: Mood, memory and judgment normal.  Vitals reviewed.  LABORATORY DATA: Lab Results  Component Value Date   WBC 6.9 06/06/2021   HGB 13.3 06/06/2021   HCT 37.9 (L) 06/06/2021   MCV 86.7 06/06/2021   PLT 205 06/06/2021      Chemistry      Component Value Date/Time   NA 134 (L) 06/06/2021 1104   K 4.4 06/06/2021 1104   CL 102 06/06/2021 1104   CO2 28 06/06/2021 1104   BUN 16 06/06/2021 1104   CREATININE 1.20 06/06/2021 1104      Component Value Date/Time   CALCIUM 8.5 (L) 06/06/2021 1104   ALKPHOS 49 06/06/2021 1104   AST 13 (L) 06/06/2021 1104   ALT 15 06/06/2021 1104   BILITOT 0.5 06/06/2021 1104       RADIOGRAPHIC STUDIES:  No results found.   ASSESSMENT/PLAN:  This is a very pleasant 71 year old Caucasian male with metastatic lung cancer initially diagnosed as stage IIb (T1b, N1, M0) non-small cell lung cancer, adenocarcinoma.  He is status post a right upper lobe lobectomy with lymph node dissection under the care of Dr.  Roxan Hockey.  This was performed on June 17, 2019.  He has no actionable mutations.  His PD-L1 expression is 90%. He had evidence for metastatic disease in November 2021 with small right pleural effusion as well as multiple right pleural implants.   The patient completed 3 of the 4 planned adjuvant chemotherapy cycles with cisplatin 75 mg per metered square and Alimta 500 mg/m. This was discontinued after cycle #3 due to intolerance.   When the patient was found to have evidence for disease recurrence, the patient was started on immunotherapy with Libtayo (Cempilimab) 350 mg IV every 3 weeks status post 23 cycles. He is tolerating this well.  Labs were reviewed. Recommend that he proceed  with cycle #24 today scheduled.  His blood sugar resulted at 351 on labs. Upon check in the exam room with his dexcom, his BS is 302. He has insulin with him and will take it as directed by his endocrinologist.   For his cramping, his calcium is a little low today which is not typical for him. This is likely contributing to his cramping. Advised to increase his calcium intake for the next few days. He can also take a calcium or multivitamin with calcium the next few days OTC if he desires. He was also encouraged to hydrate.   I will arrange for a restaging CT scan prior to his next cycle of treatment.    We will see him back for follow-up visit in 3 weeks for evaluation before starting cycle #25.   He will continue to use anti-histamines and hydrocortisone if needed for itching. He denied significant itching today.    We will continue to follow closely with endocrinology regarding his diabetes..  The patient was advised to call immediately if he has any concerning symptoms in the interval. The patient voices understanding of current disease status and treatment options and is in agreement with the current care plan. All questions were answered. The patient knows to call the clinic with any problems, questions  or concerns. We can certainly see the patient much sooner if necessary  Orders Placed This Encounter  Procedures   CT Chest W Contrast    Standing Status:   Future    Standing Expiration Date:   06/06/2022    Order Specific Question:   If indicated for the ordered procedure, I authorize the administration of contrast media per Radiology protocol    Answer:   Yes    Order Specific Question:   Preferred imaging location?    Answer:   [facility]   CT Abdomen Pelvis W Contrast    Standing Status:   Future    Standing Expiration Date:   06/06/2022    Order Specific Question:   If indicated for the ordered procedure, I authorize the administration of contrast media per Radiology protocol    Answer:   Yes    Order Specific Question:   Preferred imaging location?    Answer:   [facility]    Order Specific Question:   Is Oral Contrast requested for this exam?    Answer:   Yes, Per Radiology protocol     The total time spent in the appointment was 20-29 minutes.   Nattalie Santiesteban L Billey Wojciak, PA-C 06/06/21

## 2021-06-06 ENCOUNTER — Inpatient Hospital Stay: Payer: Medicare Other

## 2021-06-06 ENCOUNTER — Encounter: Payer: Self-pay | Admitting: Physician Assistant

## 2021-06-06 ENCOUNTER — Encounter: Payer: Self-pay | Admitting: Internal Medicine

## 2021-06-06 ENCOUNTER — Inpatient Hospital Stay (HOSPITAL_BASED_OUTPATIENT_CLINIC_OR_DEPARTMENT_OTHER): Payer: Medicare Other | Admitting: Physician Assistant

## 2021-06-06 VITALS — BP 132/78 | HR 87 | Temp 98.3°F | Resp 17 | Wt 144.5 lb

## 2021-06-06 DIAGNOSIS — C3491 Malignant neoplasm of unspecified part of right bronchus or lung: Secondary | ICD-10-CM

## 2021-06-06 DIAGNOSIS — E109 Type 1 diabetes mellitus without complications: Secondary | ICD-10-CM | POA: Diagnosis not present

## 2021-06-06 DIAGNOSIS — C782 Secondary malignant neoplasm of pleura: Secondary | ICD-10-CM | POA: Diagnosis not present

## 2021-06-06 DIAGNOSIS — C3411 Malignant neoplasm of upper lobe, right bronchus or lung: Secondary | ICD-10-CM | POA: Diagnosis not present

## 2021-06-06 DIAGNOSIS — R5383 Other fatigue: Secondary | ICD-10-CM

## 2021-06-06 DIAGNOSIS — Z902 Acquired absence of lung [part of]: Secondary | ICD-10-CM | POA: Diagnosis not present

## 2021-06-06 DIAGNOSIS — Z5111 Encounter for antineoplastic chemotherapy: Secondary | ICD-10-CM

## 2021-06-06 DIAGNOSIS — J910 Malignant pleural effusion: Secondary | ICD-10-CM | POA: Diagnosis not present

## 2021-06-06 DIAGNOSIS — Z5112 Encounter for antineoplastic immunotherapy: Secondary | ICD-10-CM

## 2021-06-06 LAB — CBC WITH DIFFERENTIAL (CANCER CENTER ONLY)
Abs Immature Granulocytes: 0.07 10*3/uL (ref 0.00–0.07)
Basophils Absolute: 0.1 10*3/uL (ref 0.0–0.1)
Basophils Relative: 1 %
Eosinophils Absolute: 0.2 10*3/uL (ref 0.0–0.5)
Eosinophils Relative: 3 %
HCT: 37.9 % — ABNORMAL LOW (ref 39.0–52.0)
Hemoglobin: 13.3 g/dL (ref 13.0–17.0)
Immature Granulocytes: 1 %
Lymphocytes Relative: 13 %
Lymphs Abs: 0.9 10*3/uL (ref 0.7–4.0)
MCH: 30.4 pg (ref 26.0–34.0)
MCHC: 35.1 g/dL (ref 30.0–36.0)
MCV: 86.7 fL (ref 80.0–100.0)
Monocytes Absolute: 0.6 10*3/uL (ref 0.1–1.0)
Monocytes Relative: 9 %
Neutro Abs: 5.1 10*3/uL (ref 1.7–7.7)
Neutrophils Relative %: 73 %
Platelet Count: 205 10*3/uL (ref 150–400)
RBC: 4.37 MIL/uL (ref 4.22–5.81)
RDW: 12.2 % (ref 11.5–15.5)
WBC Count: 6.9 10*3/uL (ref 4.0–10.5)
nRBC: 0 % (ref 0.0–0.2)

## 2021-06-06 LAB — CMP (CANCER CENTER ONLY)
ALT: 15 U/L (ref 0–44)
AST: 13 U/L — ABNORMAL LOW (ref 15–41)
Albumin: 3.6 g/dL (ref 3.5–5.0)
Alkaline Phosphatase: 49 U/L (ref 38–126)
Anion gap: 4 — ABNORMAL LOW (ref 5–15)
BUN: 16 mg/dL (ref 8–23)
CO2: 28 mmol/L (ref 22–32)
Calcium: 8.5 mg/dL — ABNORMAL LOW (ref 8.9–10.3)
Chloride: 102 mmol/L (ref 98–111)
Creatinine: 1.2 mg/dL (ref 0.61–1.24)
GFR, Estimated: >60 mL/min (ref >60)
Glucose, Bld: 351 mg/dL — ABNORMAL HIGH (ref 70–99)
Potassium: 4.4 mmol/L (ref 3.5–5.1)
Sodium: 134 mmol/L — ABNORMAL LOW (ref 135–145)
Total Bilirubin: 0.5 mg/dL (ref 0.3–1.2)
Total Protein: 6.1 g/dL — ABNORMAL LOW (ref 6.5–8.1)

## 2021-06-06 LAB — TSH: TSH: 0.923 u[IU]/mL (ref 0.350–4.500)

## 2021-06-06 MED ORDER — SODIUM CHLORIDE 0.9 % IV SOLN: Freq: Once | INTRAVENOUS | Status: AC

## 2021-06-06 MED ORDER — HEPARIN SOD (PORK) LOCK FLUSH 100 UNIT/ML IV SOLN
500.0000 [IU] | Freq: Once | INTRAVENOUS | Status: AC | PRN
  Administered 2021-06-06: 500 [IU]

## 2021-06-06 MED ORDER — SODIUM CHLORIDE 0.9% FLUSH
10.0000 mL | INTRAVENOUS | Status: DC | PRN
  Administered 2021-06-06: 10 mL

## 2021-06-06 MED ORDER — SODIUM CHLORIDE 0.9 % IV SOLN
350.0000 mg | Freq: Once | INTRAVENOUS | Status: AC
  Administered 2021-06-06: 350 mg via INTRAVENOUS
  Filled 2021-06-06: qty 7

## 2021-06-06 NOTE — Patient Instructions (Signed)
[facility] ONCOLOGY  Discharge Instructions: Thank you for choosing [facility] to provide your oncology and hematology care.   If you have a lab appointment with the [facility], please go directly to the [facility] and check in at the registration area.   Wear comfortable clothing and clothing appropriate for easy access to any Portacath or PICC line.   We strive to give you quality time with your provider. You may need to reschedule your appointment if you arrive late (15 or more minutes).  Arriving late affects you and other patients whose appointments are after yours.  Also, if you miss three or more appointments without notifying the office, you may be dismissed from the clinic at the provider's discretion.      For prescription refill requests, have your pharmacy contact our office and allow 72 hours for refills to be completed.    Today you received the following chemotherapy and/or immunotherapy agents: Libtayo      To help prevent nausea and vomiting after your treatment, we encourage you to take your nausea medication as directed.  BELOW ARE SYMPTOMS THAT SHOULD BE REPORTED IMMEDIATELY: *FEVER GREATER THAN 100.4 F (38 C) OR HIGHER *CHILLS OR SWEATING *NAUSEA AND VOMITING THAT IS NOT CONTROLLED WITH YOUR NAUSEA MEDICATION *UNUSUAL SHORTNESS OF BREATH *UNUSUAL BRUISING OR BLEEDING *URINARY PROBLEMS (pain or burning when urinating, or frequent urination) *BOWEL PROBLEMS (unusual diarrhea, constipation, pain near the anus) TENDERNESS IN MOUTH AND THROAT WITH OR WITHOUT PRESENCE OF ULCERS (sore throat, sores in mouth, or a toothache) UNUSUAL RASH, SWELLING OR PAIN  UNUSUAL VAGINAL DISCHARGE OR ITCHING   Items with * indicate a potential emergency and should be followed up as soon as possible or go to the Emergency Department if any problems should occur.  Please show the CHEMOTHERAPY ALERT CARD or IMMUNOTHERAPY ALERT CARD at check-in to  the Emergency Department and triage nurse.  Should you have questions after your visit or need to cancel or reschedule your appointment, please contact [facility]  Dept: [phone]  and follow the prompts.  Office hours are 8:00 a.m. to 4:30 p.m. Monday - Friday. Please note that voicemails left after 4:00 p.m. may not be returned until the following business day.  We are closed weekends and major holidays. You have access to a nurse at all times for urgent questions. Please call the main number to the clinic Dept: [phone] and follow the prompts.   For any non-urgent questions, you may also contact your provider using MyChart. We now offer e-Visits for anyone 49 and older to request care online for non-urgent symptoms. For details visit [email or web address].   Also download the MyChart app! Go to the app store, search "MyChart", open the app, select [facility], and log in with your MyChart username and password.  Due to Covid, a mask is required upon entering the hospital/clinic. If you do not have a mask, one will be given to you upon arrival. For doctor visits, patients may have 1 support person aged 32 or older with them. For treatment visits, patients cannot have anyone with them due to current Covid guidelines and our immunocompromised population.

## 2021-06-14 ENCOUNTER — Encounter: Payer: Self-pay | Admitting: Internal Medicine

## 2021-06-22 ENCOUNTER — Ambulatory Visit
Admission: RE | Admit: 2021-06-22 | Discharge: 2021-06-22 | Disposition: A | Discharge status: Home / Self Care | Payer: Medicare Other | Source: Ambulatory Visit | Attending: Physician Assistant | Admitting: Physician Assistant

## 2021-06-22 DIAGNOSIS — K7689 Other specified diseases of liver: Secondary | ICD-10-CM | POA: Diagnosis not present

## 2021-06-22 DIAGNOSIS — K4090 Unilateral inguinal hernia, without obstruction or gangrene, not specified as recurrent: Secondary | ICD-10-CM | POA: Diagnosis not present

## 2021-06-22 DIAGNOSIS — C3491 Malignant neoplasm of unspecified part of right bronchus or lung: Secondary | ICD-10-CM | POA: Insufficient documentation

## 2021-06-22 DIAGNOSIS — J432 Centrilobular emphysema: Secondary | ICD-10-CM | POA: Diagnosis not present

## 2021-06-22 DIAGNOSIS — S2242XA Multiple fractures of ribs, left side, initial encounter for closed fracture: Secondary | ICD-10-CM | POA: Diagnosis not present

## 2021-06-22 DIAGNOSIS — C3490 Malignant neoplasm of unspecified part of unspecified bronchus or lung: Secondary | ICD-10-CM | POA: Diagnosis not present

## 2021-06-22 DIAGNOSIS — I700 Atherosclerosis of aorta: Secondary | ICD-10-CM | POA: Diagnosis not present

## 2021-06-22 MED ORDER — HEPARIN SOD (PORK) LOCK FLUSH 100 UNIT/ML IV SOLN
500.0000 [IU] | Freq: Once | INTRAVENOUS | Status: AC
  Administered 2021-06-22: 500 [IU] via INTRAVENOUS

## 2021-06-22 MED ORDER — IOHEXOL 300 MG/ML  SOLN
85.0000 mL | Freq: Once | INTRAMUSCULAR | Status: AC | PRN
  Administered 2021-06-22: 85 mL via INTRAVENOUS

## 2021-06-25 ENCOUNTER — Other Ambulatory Visit: Payer: Self-pay | Admitting: Physician Assistant

## 2021-06-25 ENCOUNTER — Encounter: Payer: Self-pay | Admitting: Internal Medicine

## 2021-06-26 ENCOUNTER — Telehealth: Payer: Self-pay | Admitting: Medical Oncology

## 2021-06-26 NOTE — Telephone Encounter (Signed)
Wife notified that Luis [name] will review scan with [facility] tomorrow.

## 2021-06-27 ENCOUNTER — Inpatient Hospital Stay: Payer: Medicare Other

## 2021-06-27 ENCOUNTER — Other Ambulatory Visit: Payer: Self-pay

## 2021-06-27 ENCOUNTER — Other Ambulatory Visit: Payer: Medicare Other

## 2021-06-27 ENCOUNTER — Inpatient Hospital Stay: Payer: Medicare Other | Attending: Internal Medicine | Admitting: Internal Medicine

## 2021-06-27 VITALS — BP 121/81 | HR 103 | Temp 98.2°F | Resp 17 | Wt 144.9 lb

## 2021-06-27 VITALS — HR 86

## 2021-06-27 DIAGNOSIS — J449 Chronic obstructive pulmonary disease, unspecified: Secondary | ICD-10-CM | POA: Diagnosis not present

## 2021-06-27 DIAGNOSIS — Z79899 Other long term (current) drug therapy: Secondary | ICD-10-CM | POA: Diagnosis not present

## 2021-06-27 DIAGNOSIS — C3411 Malignant neoplasm of upper lobe, right bronchus or lung: Secondary | ICD-10-CM | POA: Insufficient documentation

## 2021-06-27 DIAGNOSIS — Z5112 Encounter for antineoplastic immunotherapy: Secondary | ICD-10-CM | POA: Insufficient documentation

## 2021-06-27 DIAGNOSIS — E109 Type 1 diabetes mellitus without complications: Secondary | ICD-10-CM | POA: Insufficient documentation

## 2021-06-27 DIAGNOSIS — Z794 Long term (current) use of insulin: Secondary | ICD-10-CM | POA: Diagnosis not present

## 2021-06-27 DIAGNOSIS — C3491 Malignant neoplasm of unspecified part of right bronchus or lung: Secondary | ICD-10-CM

## 2021-06-27 DIAGNOSIS — Z5111 Encounter for antineoplastic chemotherapy: Secondary | ICD-10-CM

## 2021-06-27 DIAGNOSIS — R5383 Other fatigue: Secondary | ICD-10-CM

## 2021-06-27 DIAGNOSIS — Z902 Acquired absence of lung [part of]: Secondary | ICD-10-CM | POA: Insufficient documentation

## 2021-06-27 LAB — CBC WITH DIFFERENTIAL (CANCER CENTER ONLY)
Abs Immature Granulocytes: 0.04 10*3/uL (ref 0.00–0.07)
Basophils Absolute: 0.1 10*3/uL (ref 0.0–0.1)
Basophils Relative: 1 %
Eosinophils Absolute: 0.3 10*3/uL (ref 0.0–0.5)
Eosinophils Relative: 4 %
HCT: 42.6 % (ref 39.0–52.0)
Hemoglobin: 14.6 g/dL (ref 13.0–17.0)
Immature Granulocytes: 1 %
Lymphocytes Relative: 16 %
Lymphs Abs: 1.3 10*3/uL (ref 0.7–4.0)
MCH: 30 pg (ref 26.0–34.0)
MCHC: 34.3 g/dL (ref 30.0–36.0)
MCV: 87.5 fL (ref 80.0–100.0)
Monocytes Absolute: 0.6 10*3/uL (ref 0.1–1.0)
Monocytes Relative: 7 %
Neutro Abs: 6.1 10*3/uL (ref 1.7–7.7)
Neutrophils Relative %: 71 %
Platelet Count: 212 10*3/uL (ref 150–400)
RBC: 4.87 MIL/uL (ref 4.22–5.81)
RDW: 13.6 % (ref 11.5–15.5)
WBC Count: 8.5 10*3/uL (ref 4.0–10.5)
nRBC: 0 % (ref 0.0–0.2)

## 2021-06-27 LAB — CMP (CANCER CENTER ONLY)
ALT: 15 U/L (ref 0–44)
AST: 13 U/L — ABNORMAL LOW (ref 15–41)
Albumin: 4.1 g/dL (ref 3.5–5.0)
Alkaline Phosphatase: 43 U/L (ref 38–126)
Anion gap: 3 — ABNORMAL LOW (ref 5–15)
BUN: 18 mg/dL (ref 8–23)
CO2: 30 mmol/L (ref 22–32)
Calcium: 9.4 mg/dL (ref 8.9–10.3)
Chloride: 104 mmol/L (ref 98–111)
Creatinine: 1.22 mg/dL (ref 0.61–1.24)
GFR, Estimated: >60 mL/min (ref >60)
Glucose, Bld: 232 mg/dL — ABNORMAL HIGH (ref 70–99)
Potassium: 4.5 mmol/L (ref 3.5–5.1)
Sodium: 137 mmol/L (ref 135–145)
Total Bilirubin: 0.7 mg/dL (ref 0.3–1.2)
Total Protein: 6.4 g/dL — ABNORMAL LOW (ref 6.5–8.1)

## 2021-06-27 LAB — TSH: TSH: 1.551 u[IU]/mL (ref 0.350–4.500)

## 2021-06-27 MED ORDER — SODIUM CHLORIDE 0.9 % IV SOLN
350.0000 mg | Freq: Once | INTRAVENOUS | Status: AC
  Administered 2021-06-27: 350 mg via INTRAVENOUS
  Filled 2021-06-27: qty 7

## 2021-06-27 MED ORDER — SODIUM CHLORIDE 0.9% FLUSH
10.0000 mL | INTRAVENOUS | Status: DC | PRN
  Administered 2021-06-27: 10 mL

## 2021-06-27 MED ORDER — HEPARIN SOD (PORK) LOCK FLUSH 100 UNIT/ML IV SOLN
500.0000 [IU] | Freq: Once | INTRAVENOUS | Status: AC | PRN
  Administered 2021-06-27: 500 [IU]

## 2021-06-27 MED ORDER — SODIUM CHLORIDE 0.9 % IV SOLN: Freq: Once | INTRAVENOUS | Status: AC

## 2021-06-27 NOTE — Progress Notes (Signed)
[facility] [phone]   [phone]  OFFICE PROGRESS NOTE  [name], MD [address] [place] [address]  DIAGNOSIS:  1) Metastatic non-small cell lung cancer initially diagnosed as stage IIB (T1b, N1, M0) non-small cell lung cancer, invasive poorly differentiated carcinoma diagnosed in June 2021 with disease recurrence in November 2021 Molecular studies are negative for EGFR mutation as well as ALK gene translocation.  PD-L1 expression was 50-100%.  This is based on the report from the [facility] trial. Molecular studies by [employer]: Showed no actionable mutations and PD-L1 expression was 90% 2) immunotherapy induced type 1 diabetes mellitus.  PRIOR THERAPY:  1) Status post right upper lobectomy with lymph node dissection on June 17, 2019 under the care of Dr. Roxan Hockey. 2) Adjuvant systemic chemotherapy with cisplatin 75 mg/M2 and Alimta 500 mg/M2 every 3 weeks.  First dose September 02, 2019. Status post 3 cycles.  His treatment was discontinued secondary to intolerance.  CURRENT THERAPY: First-line treatment with immunotherapy with Libtayo (Cempilimab) 350 mg IV every 3 weeks status post 24 cycles.  INTERVAL HISTORY: Luis [name] 71 y.o. male returns to the clinic today for follow-up visit.  The patient is feeling fine today with no concerning complaints.  He denied having any current chest pain, shortness of breath, cough or hemoptysis.  He has no nausea, vomiting, diarrhea or constipation.  He has no headache or visual changes.  He is working with his endocrinologist on adjusting his doses for the insulin for his type 1 diabetes mellitus.  The patient denied having any recent weight loss or night sweats.  He continues to tolerate his treatment with immunotherapy fairly well.  He is here today for evaluation with repeat CT scan of the chest, abdomen and pelvis for restaging of his disease.  MEDICAL HISTORY: Past Medical  History:  Diagnosis Date   Arthritis    through out body   Chronic bronchitis (HCC)    Concussion    COPD (chronic obstructive pulmonary disease) (HCC)    Diabetes mellitus without complication (HCC)    Dyspnea    Emphysema lung (HCC)    Fatigue    Frozen shoulder    LEFT   GERD (gastroesophageal reflux disease)    Hard of hearing    Headache    alot of days, related to spine issues   Hypertension    Lumbar radiculopathy 2013   nscl ca 04/2019   Peyronie's disease    Prostatitis 2011   Dr. Gaynelle Arabian   RLS (restless legs syndrome)    Shingles 06/13/2018   shingles on back, finished with prednisone, stills has scabs and pain   Thigh pain    Bilateral Thigh   Thigh pain 10/2011   bilateral   Tobacco abuse 2010   still uses electronic cig/ emphysema, SOB easily   Vertigo    per wifes update   Wears partial dentures    upper    ALLERGIES:  is allergic to ativan [lorazepam], doxycycline, and tramadol.  MEDICATIONS:  Current Outpatient Medications  Medication Sig Dispense Refill   acetaminophen (TYLENOL) 500 MG tablet Take 1,000 mg by mouth at bedtime as needed for moderate pain.     albuterol (VENTOLIN HFA) 108 (90 Base) MCG/ACT inhaler INHALE 1-2 PUFFS BY ORAL INHALATION EVERY 4 HOURS FOR CHRONIC OBSTRUCTIVE LUNG DISEASE BE SURE TO WASH MOUTHPIECE WITH WARM WATER ONCE A WEEK     amLODipine (NORVASC) 5 MG tablet  TAKE ONE-HALF TABLET BY MOUTH EVERY DAY FOR BLOOD PRESSURE. NOTE NEW MEDICINE     b complex vitamins capsule Take 1 capsule by mouth daily.     Cholecalciferol (VITAMIN D) 125 MCG (5000 UT) CAPS Take 5,000 Units by mouth daily.     citalopram (CELEXA) 20 MG tablet Take 1 tablet by mouth daily.     Continuous Blood Gluc Sensor (DEXCOM G6 SENSOR) MISC 1 Device by Does not apply route as directed. 9 each 3   Continuous Blood Gluc Transmit (DEXCOM G6 TRANSMITTER) MISC 1 Device by Does not apply route as directed. 1 each 3   fluticasone-salmeterol (ADVAIR) 250-50  MCG/ACT AEPB 1 puff     glucose blood (ACCU-CHEK GUIDE) test strip See admin instructions.     insulin aspart (NOVOLOG FLEXPEN) 100 UNIT/ML FlexPen Max daily 30 units (Patient taking differently: 2-6 Units 3 (three) times daily before meals. Usually takes 2-6 units before meals - based on 1:10 ICR) 15 mL 11   insulin glargine (LANTUS SOLOSTAR) 100 UNIT/ML Solostar Pen Inject 10 Units into the skin daily. 15 mL 6   Insulin Pen Needle 32G X 4 MM MISC 1 Device by Does not apply route in the morning, at noon, in the evening, and at bedtime. 400 each 3   lidocaine-prilocaine (EMLA) cream Apply 1 application topically as needed. Apply 1 tsp over port site at least 45 minutes prior to lab appointment.Do not rub in cream. Cover with plastic wrap. 30 g 0   losartan (COZAAR) 50 MG tablet TAKE ONE TABLET BY MOUTH EVERY DAY FOR HYPERTENSION     Multiple Vitamin (MULTIVITAMIN) tablet Take 1 tablet by mouth 2 (two) times a week.     vitamin B-12 (CYANOCOBALAMIN) 500 MCG tablet Take 500 mcg by mouth daily.     No current facility-administered medications for this visit.    SURGICAL HISTORY:  Past Surgical History:  Procedure Laterality Date   CATARACT EXTRACTION W/PHACO Right 07/08/2018   Procedure: CATARACT EXTRACTION PHACO AND INTRAOCULAR LENS PLACEMENT ([facility]) RIGHT;  Surgeon: Leandrew Koyanagi, MD;  Location: [facility];  Service: Ophthalmology;  Laterality: Right;   CATARACT EXTRACTION W/PHACO Left 07/29/2018   Procedure: CATARACT EXTRACTION PHACO AND INTRAOCULAR LENS PLACEMENT ([place]) LEFT TORIC LENS;  Surgeon: Leandrew Koyanagi, MD;  Location: [facility];  Service: Ophthalmology;  Laterality: Left;   CHEST TUBE INSERTION Right 07/01/2019   Procedure: CHEST TUBE INSERTION;  Surgeon: [name], MD;  Location: [facility];  Service: Thoracic;  Laterality: Right;   COLONOSCOPY     EYE SURGERY     INTERCOSTAL NERVE BLOCK Right 06/17/2019   Procedure: INTERCOSTAL NERVE BLOCK;   Surgeon: [name], MD;  Location: [facility];  Service: Thoracic;  Laterality: Right;   INTERCOSTAL NERVE BLOCK Right 07/01/2019   Procedure: INTERCOSTAL NERVE BLOCK;  Surgeon: [name], MD;  Location: [facility];  Service: Thoracic;  Laterality: Right;   IR IMAGING GUIDED PORT INSERTION  02/21/2020   TONSILLECTOMY     VIDEO ASSISTED THORACOSCOPY Right 07/01/2019   Procedure: VIDEO ASSISTED THORACOSCOPY - REMOVAL OF RIGHT MIDDLE LOBE BLEBS;  Surgeon: [name], MD;  Location: [facility];  Service: Thoracic;  Laterality: Right;   VIDEO BRONCHOSCOPY WITH INSERTION OF INTERBRONCHIAL VALVE (IBV) Right 06/25/2019   Procedure: VIDEO BRONCHOSCOPY WITH INSERTION OF INTERBRONCHIAL VALVE (IBV); Size 7 IBV into Right Loer Lobe (RLL) Superior Segment, Size 9 IBV into RLL Basilar Segment;  Surgeon: [name], MD;  Location: [facility]  OR;  Service: Thoracic;  Laterality: Right;   VIDEO BRONCHOSCOPY WITH INSERTION OF INTERBRONCHIAL VALVE (IBV) N/A 08/06/2019   Procedure: VIDEO BRONCHOSCOPY WITH REMOVAL OF INTERBRONCHIAL VALVE (IBV);  Surgeon: [name], MD;  Location: [facility] OR;  Service: Thoracic;  Laterality: N/A;    REVIEW OF SYSTEMS:  Constitutional: positive for fatigue Eyes: negative Ears, nose, mouth, throat, and face: negative Respiratory: negative Cardiovascular: negative Gastrointestinal: negative Genitourinary:negative Integument/breast: negative Hematologic/lymphatic: negative Musculoskeletal:negative Neurological: negative Behavioral/Psych: negative Endocrine: negative Allergic/Immunologic: negative   PHYSICAL EXAMINATION: General appearance: alert, cooperative, and no distress Head: Normocephalic, without obvious abnormality, atraumatic Neck: no adenopathy, no JVD, supple, symmetrical, trachea midline, and thyroid not enlarged, symmetric, no tenderness/mass/nodules Lymph nodes: Cervical, supraclavicular, and axillary nodes normal. Resp: clear to  auscultation bilaterally Back: symmetric, no curvature. ROM normal. No CVA tenderness. Cardio: regular rate and rhythm, S1, S2 normal, no murmur, click, rub or gallop GI: soft, non-tender; bowel sounds normal; no masses,  no organomegaly Extremities: extremities normal, atraumatic, no cyanosis or edema Neurologic: Alert and oriented X 3, normal strength and tone. Normal symmetric reflexes. Normal coordination and gait  ECOG PERFORMANCE STATUS: 1 - Symptomatic but completely ambulatory  Blood pressure 121/81, pulse (!) 103, temperature 98.2 F (36.8 C), temperature source Oral, resp. rate 17, weight 144 lb 14.4 oz (65.7 kg), SpO2 95 %.  LABORATORY DATA: Lab Results  Component Value Date   WBC 8.5 06/27/2021   HGB 14.6 06/27/2021   HCT 42.6 06/27/2021   MCV 87.5 06/27/2021   PLT 212 06/27/2021      Chemistry      Component Value Date/Time   NA 134 (L) 06/06/2021 1104   K 4.4 06/06/2021 1104   CL 102 06/06/2021 1104   CO2 28 06/06/2021 1104   BUN 16 06/06/2021 1104   CREATININE 1.20 06/06/2021 1104      Component Value Date/Time   CALCIUM 8.5 (L) 06/06/2021 1104   ALKPHOS 49 06/06/2021 1104   AST 13 (L) 06/06/2021 1104   ALT 15 06/06/2021 1104   BILITOT 0.5 06/06/2021 1104       RADIOGRAPHIC STUDIES: CT CHEST ABDOMEN PELVIS W CONTRAST  Result Date: 06/25/2021 CLINICAL DATA:  Non-small cell lung cancer treated with chemotherapy. Currently on immunotherapy. Shortness of breath on exertion. * Tracking Code: BO * EXAM: CT CHEST, ABDOMEN, AND PELVIS WITH CONTRAST TECHNIQUE: Multidetector CT imaging of the chest, abdomen and pelvis was performed following the standard protocol during bolus administration of intravenous contrast. RADIATION DOSE REDUCTION: This exam was performed according to the departmental dose-optimization program which includes automated exposure control, adjustment of the mA and/or kV according to patient size and/or use of iterative reconstruction technique.  CONTRAST:  28mL OMNIPAQUE IOHEXOL 300 MG/ML  SOLN COMPARISON:  04/03/2021. FINDINGS: CT CHEST FINDINGS Cardiovascular: Right IJ Port-A-Cath terminates at the SVC RA junction. Atherosclerotic calcification of the aorta and coronary arteries. Heart size normal. No pericardial effusion. Mediastinum/Nodes: Mediastinal lymph nodes are not enlarged by CT size criteria. No hilar or axillary adenopathy. Esophagus is grossly unremarkable. Lungs/Pleura: Centrilobular and paraseptal emphysema. Right upper lobectomy. Pleuroparenchymal scarring in the mid and lower right hemithorax. Subsolid nodule in the posterior perifissural left upper lobe measures 1.1 x 2.1 cm with an internal 5 mm nodular component, as on 04/03/2021. When compared with 06/15/2020, overall size has increased slightly from 1.0 x 1.9 cm and from 04/26/2019, increased from 0.7 x 1.3 cm. Areas of peribronchovascular nodularity/scarring in the left upper and left lower lobes. No pleural fluid.  Adherent debris in the airway. Musculoskeletal: Degenerative changes in the spine. Old left rib fracture. No worrisome lytic or sclerotic lesions. CT ABDOMEN PELVIS FINDINGS Hepatobiliary: 1.5 cm left hepatic lobe cyst. Subcentimeter low-attenuation lesion in the right hepatic lobe, too small to characterize but also likely a cyst. Liver and gallbladder are otherwise unremarkable. No biliary ductal dilatation. Pancreas: Negative. Spleen: Negative. Adrenals/Urinary Tract: Nodular thickening of the adrenal glands. No specific follow-up necessary. Low-attenuation lesions in the kidneys measure up to 5.3 cm on the right and are likely cysts. No specific follow-up necessary. Kidneys are otherwise unremarkable. Ureters are decompressed. Bladder is grossly unremarkable. Stomach/Bowel: Stomach, small bowel and colon are unremarkable. Appendix is not readily visualized. Vascular/Lymphatic: Atherosclerotic calcification of the aorta. No pathologically enlarged lymph nodes.  Reproductive: Prostate is visualized. Other: Small left inguinal hernia contains fat. No free fluid. Mesenteries and peritoneum are unremarkable. Musculoskeletal: Degenerative changes in the spine. No worrisome lytic or sclerotic lesions. IMPRESSION: 1. Slowly enlarging subsolid nodule in the left upper lobe, highly worrisome for indolent adenocarcinoma. 2. Aortic atherosclerosis (ICD10-I70.0). Coronary artery calcification. 3.  Emphysema (ICD10-J43.9). Electronically Signed   By: Lorin Picket M.D.   On: 06/25/2021 13:03    ASSESSMENT AND PLAN: This is a very pleasant 71 years old white male recently diagnosed with stage IIb (T1b, N1, M0) non-small cell lung cancer, adenocarcinoma status post right upper lobectomy with lymph node dissection under the care of Dr. Roxan Hockey on June 17, 2019. The patient had molecular studies on the [facility] clinical trial that showed negative for EGFR mutation as well as ALK gene translocation but PD-L1 expression in the range of 50-100%. The patient underwent standard adjuvant systemic chemotherapy with cisplatin 75 mg/M2 and Alimta 500 mg/M2 every 3 weeks status post 3 cycles.  His treatment was discontinued secondary to intolerance. Unfortunately repeat CT scan as well as PET scan after completion of the adjuvant chemotherapy showed concerning findings for disease recurrence with small right pleural effusion as well as multiple pleural implants consistent with metastatic disease which was confirmed with repeat biopsy. He had molecular studies by [employer] and the blood test showed no actionable mutation.  His PD-L1 expression was 90%. The patient is currently undergoing treatment with immunotherapy with Libtayo (Cempilimab) 350 mg IV every 3 weeks status post 24 cycles. He was diagnosed with immunotherapy mediated type 1 diabetes mellitus and currently on treatment with insulin. The patient continues to tolerate his treatment with Libtayo (Cempilimab) fairly well  with no concerning adverse effects. He had repeat CT scan of the chest, abdomen pelvis performed recently.  I personally and independently reviewed the scan images and discussed the result and showed the images to the patient today. His scan showed no concerning findings for disease progression in the right lung but there was a slowly enlarging subsolid nodule in the left upper lobe concerning for indolent adenocarcinoma. I recommended for the patient to continue his current treatment with Libtayo (Cempilimab) with the same dose. I will continue to monitor the nodule in the left upper lobe closely and may consider treatment with SBRT in the future if it continues to increase in size.  The patient is in agreement with this plan. He will come back for follow-up visit in 3 weeks for evaluation before the next cycle of his treatment. He was advised to call immediately if he has any other concerning symptoms in the interval.  The patient voices understanding of current disease status and treatment options and is in agreement with  the current care plan.  All questions were answered. The patient knows to call the clinic with any problems, questions or concerns. We can certainly see the patient much sooner if necessary.  Disclaimer: This note was dictated with voice recognition software. Similar sounding words can inadvertently be transcribed and may not be corrected upon review.

## 2021-06-27 NOTE — Patient Instructions (Signed)
[facility]  Discharge Instructions: Thank you for choosing [facility] to provide your oncology and hematology care.   If you have a lab appointment with the [facility], please go directly to the [facility] and check in at the registration area.   Wear comfortable clothing and clothing appropriate for easy access to any Portacath or PICC line.   We strive to give you quality time with your provider. You may need to reschedule your appointment if you arrive late (15 or more minutes).  Arriving late affects you and other patients whose appointments are after yours.  Also, if you miss three or more appointments without notifying the office, you may be dismissed from the clinic at the provider's discretion.      For prescription refill requests, have your pharmacy contact our office and allow 72 hours for refills to be completed.    Today you received the following chemotherapy and/or immunotherapy agents: Libtayo      To help prevent nausea and vomiting after your treatment, we encourage you to take your nausea medication as directed.  BELOW ARE SYMPTOMS THAT SHOULD BE REPORTED IMMEDIATELY: *FEVER GREATER THAN 100.4 F (38 C) OR HIGHER *CHILLS OR SWEATING *NAUSEA AND VOMITING THAT IS NOT CONTROLLED WITH YOUR NAUSEA MEDICATION *UNUSUAL SHORTNESS OF BREATH *UNUSUAL BRUISING OR BLEEDING *URINARY PROBLEMS (pain or burning when urinating, or frequent urination) *BOWEL PROBLEMS (unusual diarrhea, constipation, pain near the anus) TENDERNESS IN MOUTH AND THROAT WITH OR WITHOUT PRESENCE OF ULCERS (sore throat, sores in mouth, or a toothache) UNUSUAL RASH, SWELLING OR PAIN  UNUSUAL VAGINAL DISCHARGE OR ITCHING   Items with * indicate a potential emergency and should be followed up as soon as possible or go to the Emergency Department if any problems should occur.  Please show the CHEMOTHERAPY ALERT CARD or IMMUNOTHERAPY ALERT CARD at check-in to  the Emergency Department and triage nurse.  Should you have questions after your visit or need to cancel or reschedule your appointment, please contact [facility]  Dept: [phone]  and follow the prompts.  Office hours are 8:00 a.m. to 4:30 p.m. Monday - Friday. Please note that voicemails left after 4:00 p.m. may not be returned until the following business day.  We are closed weekends and major holidays. You have access to a nurse at all times for urgent questions. Please call the main number to the clinic Dept: [phone] and follow the prompts.   For any non-urgent questions, you may also contact your provider using MyChart. We now offer e-Visits for anyone 29 and older to request care online for non-urgent symptoms. For details visit [email or web address].   Also download the MyChart app! Go to the app store, search "MyChart", open the app, select [facility], and log in with your MyChart username and password.  Due to Covid, a mask is required upon entering the hospital/clinic. If you do not have a mask, one will be given to you upon arrival. For doctor visits, patients may have 1 support person aged 63 or older with them. For treatment visits, patients cannot have anyone with them due to current Covid guidelines and our immunocompromised population.

## 2021-07-04 ENCOUNTER — Telehealth: Payer: Self-pay | Admitting: Physician Assistant

## 2021-07-04 NOTE — Telephone Encounter (Signed)
Called patient regarding upcoming July appointments, patient is notified.

## 2021-07-12 NOTE — Progress Notes (Signed)
[place] OFFICE PROGRESS NOTE  [name], MD [place] [address] [place]  [id number]  DIAGNOSIS: DIAGNOSIS: Metastatic non-small cell lung cancer initially diagnosed as stage IIB (T1b, N1, M0) non-small cell lung cancer, invasive poorly differentiated carcinoma diagnosed in June 2021 with disease recurrence in November 2021 Molecular studies are negative for EGFR mutation as well as ALK gene translocation.  PD-L1 expression was 50-100%.  This is based on the report from the [facility] trial. Molecular studies by [facility]: Showed no actionable mutations and PD-L1 expression was 90%  PRIOR THERAPY:  1) Status post right upper lobectomy with lymph node dissection on June 17, 2019 under the care of Dr. Roxan Hockey. 2) Adjuvant systemic chemotherapy with cisplatin 75 mg/M2 and Alimta 500 mg/M2 every 3 weeks.  First dose September 02, 2019. Status post 3 cycles.  His treatment was discontinued secondary to intolerance.  CURRENT THERAPY:  First-line treatment with immunotherapy with Libtayo (Cempilimab) 350 mg IV every 3 weeks status post 25 cycles. First dose on 01/24/20.  INTERVAL HISTORY: Luis [name] 71 y.o. male returns to clinic today for a follow-up visit.  The patient is feeling fairly well today without any concerning complaints.  The patient is currently undergoing immunotherapy with Libtayo.  He is tolerating this well except he did develop type 1 diabetes for which he is seeing endocrinology and taking insulin.  The patient sometimes may develop a mild itching without rash for which she will use hydrocortisone cream if needed. He reports he has contact dermatitis. He has not needed to use hydrocortisone cream recently. Today, he states he is "the same". Denies any fever or chills. He reports intermittent night sweats at baseline. Denies any changes with the frequency of his night sweats.  His weight is stable. He reports his baseline dyspnea exertion secondary to  his COPD.  He sometimes has occasional chest discomfort along his prior surgical site in the right chest wall.  He reports his baseline occasional cough.  Denies hemoptysis.  Denies any nausea, vomiting, or constipation.  The patient has chronic diarrhea which he is prone to. He denies any changes with his bowel habits.  He sometimes may have an occasional headache in the occipital region which he states is also chronic secondary to disc issues in the cervical spine.  The patient is here today for evaluation repeat blood work before undergoing cycle #26     MEDICAL HISTORY: Past Medical History:  Diagnosis Date   Arthritis    through out body   Chronic bronchitis ([facility])    Concussion    COPD (chronic obstructive pulmonary disease) ([facility])    Diabetes mellitus without complication ([facility])    Dyspnea    Emphysema lung ([facility])    Fatigue    Frozen shoulder    LEFT   GERD (gastroesophageal reflux disease)    Hard of hearing    Headache    alot of days, related to spine issues   Hypertension    Lumbar radiculopathy 2013   nscl ca 04/2019   Peyronie's disease    Prostatitis 2011   Dr. Gaynelle Arabian   RLS (restless legs syndrome)    Shingles 06/13/2018   shingles on back, finished with prednisone, stills has scabs and pain   Thigh pain    Bilateral Thigh   Thigh pain 10/2011   bilateral   Tobacco abuse 2010   still uses electronic cig/ emphysema, SOB easily   Vertigo    per wifes update  Wears partial dentures    upper    ALLERGIES:  is allergic to ativan [lorazepam], doxycycline, and tramadol.  MEDICATIONS:  Current Outpatient Medications  Medication Sig Dispense Refill   acetaminophen (TYLENOL) 500 MG tablet Take 1,000 mg by mouth at bedtime as needed for moderate pain.     albuterol (VENTOLIN HFA) 108 (90 Base) MCG/ACT inhaler INHALE 1-2 PUFFS BY ORAL INHALATION EVERY 4 HOURS FOR CHRONIC OBSTRUCTIVE LUNG DISEASE BE SURE TO WASH MOUTHPIECE WITH WARM WATER ONCE A WEEK      amLODipine (NORVASC) 5 MG tablet TAKE ONE-HALF TABLET BY MOUTH EVERY DAY FOR BLOOD PRESSURE. NOTE NEW MEDICINE     b complex vitamins capsule Take 1 capsule by mouth daily.     Cholecalciferol (VITAMIN D) 125 MCG (5000 UT) CAPS Take 5,000 Units by mouth daily.     citalopram (CELEXA) 20 MG tablet Take 1 tablet by mouth daily.     Continuous Blood Gluc Sensor (DEXCOM G6 SENSOR) MISC 1 Device by Does not apply route as directed. 9 each 3   Continuous Blood Gluc Transmit (DEXCOM G6 TRANSMITTER) MISC 1 Device by Does not apply route as directed. 1 each 3   fluticasone-salmeterol (ADVAIR) 250-50 MCG/ACT AEPB 1 puff     glucose blood (ACCU-CHEK GUIDE) test strip See admin instructions.     insulin aspart (NOVOLOG FLEXPEN) 100 UNIT/ML FlexPen Max daily 30 units (Patient taking differently: 2-6 Units 3 (three) times daily before meals. Usually takes 2-6 units before meals - based on 1:10 ICR) 15 mL 11   insulin glargine (LANTUS SOLOSTAR) 100 UNIT/ML Solostar Pen Inject 10 Units into the skin daily. 15 mL 6   Insulin Pen Needle 32G X 4 MM MISC 1 Device by Does not apply route in the morning, at noon, in the evening, and at bedtime. 400 each 3   lidocaine-prilocaine (EMLA) cream Apply 1 application topically as needed. Apply 1 tsp over port site at least 45 minutes prior to lab appointment.Do not rub in cream. Cover with plastic wrap. 30 g 0   losartan (COZAAR) 50 MG tablet TAKE ONE TABLET BY MOUTH EVERY DAY FOR HYPERTENSION     Multiple Vitamin (MULTIVITAMIN) tablet Take 1 tablet by mouth 2 (two) times a week.     vitamin B-12 (CYANOCOBALAMIN) 500 MCG tablet Take 500 mcg by mouth daily.     No current facility-administered medications for this visit.    SURGICAL HISTORY:  Past Surgical History:  Procedure Laterality Date   CATARACT EXTRACTION W/PHACO Right 07/08/2018   Procedure: CATARACT EXTRACTION PHACO AND INTRAOCULAR LENS PLACEMENT ([facility]) RIGHT;  Surgeon: Leandrew Koyanagi, MD;  Location: [facility];  Service: Ophthalmology;  Laterality: Right;   CATARACT EXTRACTION W/PHACO Left 07/29/2018   Procedure: CATARACT EXTRACTION PHACO AND INTRAOCULAR LENS PLACEMENT ([facility]) LEFT TORIC LENS;  Surgeon: Leandrew Koyanagi, MD;  Location: [facility];  Service: Ophthalmology;  Laterality: Left;   CHEST TUBE INSERTION Right 07/01/2019   Procedure: CHEST TUBE INSERTION;  Surgeon: [name], MD;  Location: [facility];  Service: Thoracic;  Laterality: Right;   COLONOSCOPY     EYE SURGERY     INTERCOSTAL NERVE BLOCK Right 06/17/2019   Procedure: INTERCOSTAL NERVE BLOCK;  Surgeon: [name], MD;  Location: [facility];  Service: Thoracic;  Laterality: Right;   INTERCOSTAL NERVE BLOCK Right 07/01/2019   Procedure: INTERCOSTAL NERVE BLOCK;  Surgeon: [name], MD;  Location: [facility];  Service: Thoracic;  Laterality: Right;   IR  IMAGING GUIDED PORT INSERTION  02/21/2020   TONSILLECTOMY     VIDEO ASSISTED THORACOSCOPY Right 07/01/2019   Procedure: VIDEO ASSISTED THORACOSCOPY - REMOVAL OF RIGHT MIDDLE LOBE BLEBS;  Surgeon: [name], MD;  Location: [facility];  Service: Thoracic;  Laterality: Right;   VIDEO BRONCHOSCOPY WITH INSERTION OF INTERBRONCHIAL VALVE (IBV) Right 06/25/2019   Procedure: VIDEO BRONCHOSCOPY WITH INSERTION OF INTERBRONCHIAL VALVE (IBV); Size 7 IBV into Right Loer Lobe (RLL) Superior Segment, Size 9 IBV into RLL Basilar Segment;  Surgeon: [name], MD;  Location: [facility];  Service: Thoracic;  Laterality: Right;   VIDEO BRONCHOSCOPY WITH INSERTION OF INTERBRONCHIAL VALVE (IBV) N/A 08/06/2019   Procedure: VIDEO BRONCHOSCOPY WITH REMOVAL OF INTERBRONCHIAL VALVE (IBV);  Surgeon: [name], MD;  Location: [facility];  Service: Thoracic;  Laterality: N/A;    REVIEW OF SYSTEMS:   Review of Systems  Constitutional: Negative for appetite change, chills, fatigue, fever and unexpected weight change.  HENT: Negative for mouth sores, nosebleeds,  sore throat and trouble swallowing.   Eyes: Negative for eye problems and icterus.  Respiratory: Positive for SOB with exertion (unchanged). Positive for mild cough. Negative for hemoptysis and wheezing.   Cardiovascular: Negative for chest pain and leg swelling.  Gastrointestinal: Positive for intermittent baseline loose stool. Negative for abdominal pain, constipation, nausea and vomiting.  Genitourinary: Negative for bladder incontinence, difficulty urinating, dysuria, frequency and hematuria.   Musculoskeletal: Positive for occasional low back pain and pain in his cervical spine. Negative for gait problem and neck stiffness.  Skin: Negative for itching and rash.  Neurological: Negative for dizziness, extremity weakness, gait problem, headaches, light-headedness and seizures.  Hematological: Negative for adenopathy. Does not bruise/bleed easily.  Psychiatric/Behavioral: Negative for confusion, depression and sleep disturbance. The patient is not nervous/anxious.    PHYSICAL EXAMINATION:  Blood pressure 125/73, pulse 86, temperature (!) 97.4 F (36.3 C), temperature source Oral, resp. rate 16, height 5' 6"  (1.676 m), weight 143 lb 14.4 oz (65.3 kg), SpO2 98 %.  ECOG PERFORMANCE STATUS: 1  Physical Exam  Constitutional: Oriented to person, place, and time and well-developed, well-nourished, and in no distress.  HENT:  Head: Normocephalic and atraumatic.  Mouth/Throat: Oropharynx is clear and moist. No oropharyngeal exudate.  Eyes: Conjunctivae are normal. Right eye exhibits no discharge. Left eye exhibits no discharge. No scleral icterus.  Neck: Normal range of motion. Neck supple.  Cardiovascular: Normal rate, regular rhythm, normal heart sounds and intact distal pulses.   Pulmonary/Chest: Effort normal and breath sounds normal. No respiratory distress. No wheezes. No rales.  Abdominal: Soft. Bowel sounds are normal. Exhibits no distension and no mass. There is no tenderness.   Musculoskeletal: Normal range of motion. Exhibits no edema.  Lymphadenopathy:    No cervical adenopathy.  Neurological: Alert and oriented to person, place, and time. Exhibits normal muscle tone. Gait normal. Coordination normal.  Skin: Positive for bruising on his upper extremities. Skin is warm and dry. No rash noted. Not diaphoretic. No erythema. No pallor.  Psychiatric: Mood, memory and judgment normal.  Vitals reviewed.  LABORATORY DATA: Lab Results  Component Value Date   WBC 7.5 07/18/2021   HGB 14.5 07/18/2021   HCT 41.0 07/18/2021   MCV 86.3 07/18/2021   PLT 188 07/18/2021      Chemistry      Component Value Date/Time   NA 134 (L) 07/18/2021 0928   K 4.4 07/18/2021 0928   CL 103 07/18/2021 0928   CO2 27 07/18/2021 0928  BUN 22 07/18/2021 0928   CREATININE 1.20 07/18/2021 0928      Component Value Date/Time   CALCIUM 8.8 (L) 07/18/2021 0928   ALKPHOS 41 07/18/2021 0928   AST 12 (L) 07/18/2021 0928   ALT 13 07/18/2021 0928   BILITOT 0.6 07/18/2021 0928       RADIOGRAPHIC STUDIES:  CT CHEST ABDOMEN PELVIS W CONTRAST  Result Date: 06/25/2021 CLINICAL DATA:  Non-small cell lung cancer treated with chemotherapy. Currently on immunotherapy. Shortness of breath on exertion. * Tracking Code: BO * EXAM: CT CHEST, ABDOMEN, AND PELVIS WITH CONTRAST TECHNIQUE: Multidetector CT imaging of the chest, abdomen and pelvis was performed following the standard protocol during bolus administration of intravenous contrast. RADIATION DOSE REDUCTION: This exam was performed according to the departmental dose-optimization program which includes automated exposure control, adjustment of the mA and/or kV according to patient size and/or use of iterative reconstruction technique. CONTRAST:  29m OMNIPAQUE IOHEXOL 300 MG/ML  SOLN COMPARISON:  04/03/2021. FINDINGS: CT CHEST FINDINGS Cardiovascular: Right IJ Port-A-Cath terminates at the SVC RA junction. Atherosclerotic calcification of the  aorta and coronary arteries. Heart size normal. No pericardial effusion. Mediastinum/Nodes: Mediastinal lymph nodes are not enlarged by CT size criteria. No hilar or axillary adenopathy. Esophagus is grossly unremarkable. Lungs/Pleura: Centrilobular and paraseptal emphysema. Right upper lobectomy. Pleuroparenchymal scarring in the mid and lower right hemithorax. Subsolid nodule in the posterior perifissural left upper lobe measures 1.1 x 2.1 cm with an internal 5 mm nodular component, as on 04/03/2021. When compared with 06/15/2020, overall size has increased slightly from 1.0 x 1.9 cm and from 04/26/2019, increased from 0.7 x 1.3 cm. Areas of peribronchovascular nodularity/scarring in the left upper and left lower lobes. No pleural fluid. Adherent debris in the airway. Musculoskeletal: Degenerative changes in the spine. Old left rib fracture. No worrisome lytic or sclerotic lesions. CT ABDOMEN PELVIS FINDINGS Hepatobiliary: 1.5 cm left hepatic lobe cyst. Subcentimeter low-attenuation lesion in the right hepatic lobe, too small to characterize but also likely a cyst. Liver and gallbladder are otherwise unremarkable. No biliary ductal dilatation. Pancreas: Negative. Spleen: Negative. Adrenals/Urinary Tract: Nodular thickening of the adrenal glands. No specific follow-up necessary. Low-attenuation lesions in the kidneys measure up to 5.3 cm on the right and are likely cysts. No specific follow-up necessary. Kidneys are otherwise unremarkable. Ureters are decompressed. Bladder is grossly unremarkable. Stomach/Bowel: Stomach, small bowel and colon are unremarkable. Appendix is not readily visualized. Vascular/Lymphatic: Atherosclerotic calcification of the aorta. No pathologically enlarged lymph nodes. Reproductive: Prostate is visualized. Other: Small left inguinal hernia contains fat. No free fluid. Mesenteries and peritoneum are unremarkable. Musculoskeletal: Degenerative changes in the spine. No worrisome lytic or  sclerotic lesions. IMPRESSION: 1. Slowly enlarging subsolid nodule in the left upper lobe, highly worrisome for indolent adenocarcinoma. 2. Aortic atherosclerosis (ICD10-I70.0). Coronary artery calcification. 3.  Emphysema (ICD10-J43.9). Electronically Signed   By: MLorin PicketM.D.   On: 06/25/2021 13:03     ASSESSMENT/PLAN:  This is a very pleasant 71year old Caucasian male with metastatic lung cancer initially diagnosed as stage IIb (T1b, N1, M0) non-small cell lung cancer, adenocarcinoma.  He is status post a right upper lobe lobectomy with lymph node dissection under the care of Dr. HRoxan Hockey  This was performed on June 17, 2019.  He has no actionable mutations.  His PD-L1 expression is 90%. He had evidence for metastatic disease in November 2021 with small right pleural effusion as well as multiple right pleural implants.   The patient completed 3 of the  4 planned adjuvant chemotherapy cycles with cisplatin 75 mg per metered square and Alimta 500 mg/m. This was discontinued after cycle #3 due to intolerance.   When the patient was found to have evidence for disease recurrence, the patient was started on immunotherapy with Libtayo (Cempilimab) 350 mg IV every 3 weeks status post 25 cycles. He is tolerating this well.  The patient had a restaging CT scan prior to his last appointment which showed a slowly enlarging subsolid nodule which we are monitoring closely.  Dr. Julien Nordmann mention that if this continues to enlarge we would consider SBRT.  We will likely arrange for follow-up scan at his next appointment.  Labs were reviewed.  Recommend the patient proceed with cycle #26 today's schedule.  We will see him back for follow-up visit in 3 weeks for evaluation and repeat blood work before starting cycle #27.  The patient monitors his blood sugar closely and takes his insulin as directed by endocrinology. He has an upcoming appointment per chart review.   The patient was advised to call  immediately if he has any concerning symptoms in the interval. The patient voices understanding of current disease status and treatment options and is in agreement with the current care plan. All questions were answered. The patient knows to call the clinic with any problems, questions or concerns. We can certainly see the patient much sooner if necessary         No orders of the defined types were placed in this encounter.     The total time spent in the appointment was 20-29 minutes.  Humna Moorehouse L Katja Blue, PA-C 07/18/21

## 2021-07-18 ENCOUNTER — Other Ambulatory Visit: Payer: Medicare Other

## 2021-07-18 ENCOUNTER — Inpatient Hospital Stay: Payer: Medicare Other | Attending: Internal Medicine

## 2021-07-18 ENCOUNTER — Inpatient Hospital Stay: Payer: Medicare Other

## 2021-07-18 ENCOUNTER — Other Ambulatory Visit: Payer: Self-pay

## 2021-07-18 ENCOUNTER — Inpatient Hospital Stay (HOSPITAL_BASED_OUTPATIENT_CLINIC_OR_DEPARTMENT_OTHER): Payer: Medicare Other | Admitting: Physician Assistant

## 2021-07-18 VITALS — BP 125/73 | HR 86 | Temp 97.4°F | Resp 16 | Ht 66.0 in | Wt 143.9 lb

## 2021-07-18 DIAGNOSIS — C3491 Malignant neoplasm of unspecified part of right bronchus or lung: Secondary | ICD-10-CM | POA: Diagnosis not present

## 2021-07-18 DIAGNOSIS — Z5112 Encounter for antineoplastic immunotherapy: Secondary | ICD-10-CM | POA: Insufficient documentation

## 2021-07-18 DIAGNOSIS — Z5111 Encounter for antineoplastic chemotherapy: Secondary | ICD-10-CM

## 2021-07-18 DIAGNOSIS — J449 Chronic obstructive pulmonary disease, unspecified: Secondary | ICD-10-CM | POA: Insufficient documentation

## 2021-07-18 DIAGNOSIS — E109 Type 1 diabetes mellitus without complications: Secondary | ICD-10-CM | POA: Diagnosis not present

## 2021-07-18 DIAGNOSIS — C782 Secondary malignant neoplasm of pleura: Secondary | ICD-10-CM | POA: Diagnosis not present

## 2021-07-18 DIAGNOSIS — Z794 Long term (current) use of insulin: Secondary | ICD-10-CM | POA: Diagnosis not present

## 2021-07-18 DIAGNOSIS — C3411 Malignant neoplasm of upper lobe, right bronchus or lung: Secondary | ICD-10-CM | POA: Insufficient documentation

## 2021-07-18 DIAGNOSIS — R5383 Other fatigue: Secondary | ICD-10-CM

## 2021-07-18 LAB — CMP (CANCER CENTER ONLY)
ALT: 13 U/L (ref 0–44)
AST: 12 U/L — ABNORMAL LOW (ref 15–41)
Albumin: 4.1 g/dL (ref 3.5–5.0)
Alkaline Phosphatase: 41 U/L (ref 38–126)
Anion gap: 4 — ABNORMAL LOW (ref 5–15)
BUN: 22 mg/dL (ref 8–23)
CO2: 27 mmol/L (ref 22–32)
Calcium: 8.8 mg/dL — ABNORMAL LOW (ref 8.9–10.3)
Chloride: 103 mmol/L (ref 98–111)
Creatinine: 1.2 mg/dL (ref 0.61–1.24)
GFR, Estimated: >60 mL/min (ref >60)
Glucose, Bld: 316 mg/dL — ABNORMAL HIGH (ref 70–99)
Potassium: 4.4 mmol/L (ref 3.5–5.1)
Sodium: 134 mmol/L — ABNORMAL LOW (ref 135–145)
Total Bilirubin: 0.6 mg/dL (ref 0.3–1.2)
Total Protein: 6.3 g/dL — ABNORMAL LOW (ref 6.5–8.1)

## 2021-07-18 LAB — CBC WITH DIFFERENTIAL (CANCER CENTER ONLY)
Abs Immature Granulocytes: 0.04 10*3/uL (ref 0.00–0.07)
Basophils Absolute: 0.1 10*3/uL (ref 0.0–0.1)
Basophils Relative: 1 %
Eosinophils Absolute: 0.4 10*3/uL (ref 0.0–0.5)
Eosinophils Relative: 5 %
HCT: 41 % (ref 39.0–52.0)
Hemoglobin: 14.5 g/dL (ref 13.0–17.0)
Immature Granulocytes: 1 %
Lymphocytes Relative: 18 %
Lymphs Abs: 1.3 10*3/uL (ref 0.7–4.0)
MCH: 30.5 pg (ref 26.0–34.0)
MCHC: 35.4 g/dL (ref 30.0–36.0)
MCV: 86.3 fL (ref 80.0–100.0)
Monocytes Absolute: 0.5 10*3/uL (ref 0.1–1.0)
Monocytes Relative: 6 %
Neutro Abs: 5.2 10*3/uL (ref 1.7–7.7)
Neutrophils Relative %: 69 %
Platelet Count: 188 10*3/uL (ref 150–400)
RBC: 4.75 MIL/uL (ref 4.22–5.81)
RDW: 14 % (ref 11.5–15.5)
WBC Count: 7.5 10*3/uL (ref 4.0–10.5)
nRBC: 0 % (ref 0.0–0.2)

## 2021-07-18 LAB — TSH: TSH: 1.234 u[IU]/mL (ref 0.350–4.500)

## 2021-07-18 MED ORDER — HEPARIN SOD (PORK) LOCK FLUSH 100 UNIT/ML IV SOLN
500.0000 [IU] | Freq: Once | INTRAVENOUS | Status: AC | PRN
  Administered 2021-07-18: 500 [IU]

## 2021-07-18 MED ORDER — SODIUM CHLORIDE 0.9 % IV SOLN
350.0000 mg | Freq: Once | INTRAVENOUS | Status: AC
  Administered 2021-07-18: 350 mg via INTRAVENOUS
  Filled 2021-07-18: qty 7

## 2021-07-18 MED ORDER — SODIUM CHLORIDE 0.9% FLUSH
10.0000 mL | INTRAVENOUS | Status: DC | PRN
  Administered 2021-07-18: 10 mL

## 2021-07-18 MED ORDER — SODIUM CHLORIDE 0.9 % IV SOLN: Freq: Once | INTRAVENOUS | Status: AC

## 2021-07-18 NOTE — Patient Instructions (Signed)
[facility]  Discharge Instructions: Thank you for choosing [facility] to provide your oncology and hematology care.   If you have a lab appointment with the [facility], please go directly to the [facility] and check in at the registration area.   Wear comfortable clothing and clothing appropriate for easy access to any Portacath or PICC line.   We strive to give you quality time with your provider. You may need to reschedule your appointment if you arrive late (15 or more minutes).  Arriving late affects you and other patients whose appointments are after yours.  Also, if you miss three or more appointments without notifying the office, you may be dismissed from the clinic at the provider's discretion.      For prescription refill requests, have your pharmacy contact our office and allow 72 hours for refills to be completed.    Today you received the following chemotherapy and/or immunotherapy agents: Libtayo      To help prevent nausea and vomiting after your treatment, we encourage you to take your nausea medication as directed.  BELOW ARE SYMPTOMS THAT SHOULD BE REPORTED IMMEDIATELY: *FEVER GREATER THAN 100.4 F (38 C) OR HIGHER *CHILLS OR SWEATING *NAUSEA AND VOMITING THAT IS NOT CONTROLLED WITH YOUR NAUSEA MEDICATION *UNUSUAL SHORTNESS OF BREATH *UNUSUAL BRUISING OR BLEEDING *URINARY PROBLEMS (pain or burning when urinating, or frequent urination) *BOWEL PROBLEMS (unusual diarrhea, constipation, pain near the anus) TENDERNESS IN MOUTH AND THROAT WITH OR WITHOUT PRESENCE OF ULCERS (sore throat, sores in mouth, or a toothache) UNUSUAL RASH, SWELLING OR PAIN  UNUSUAL VAGINAL DISCHARGE OR ITCHING   Items with * indicate a potential emergency and should be followed up as soon as possible or go to the Emergency Department if any problems should occur.  Please show the CHEMOTHERAPY ALERT CARD or IMMUNOTHERAPY ALERT CARD at check-in to  the Emergency Department and triage nurse.  Should you have questions after your visit or need to cancel or reschedule your appointment, please contact [facility]  Dept: [phone]  and follow the prompts.  Office hours are 8:00 a.m. to 4:30 p.m. Monday - Friday. Please note that voicemails left after 4:00 p.m. may not be returned until the following business day.  We are closed weekends and major holidays. You have access to a nurse at all times for urgent questions. Please call the main number to the clinic Dept: [phone] and follow the prompts.   For any non-urgent questions, you may also contact your provider using MyChart. We now offer e-Visits for anyone 27 and older to request care online for non-urgent symptoms. For details visit [email or web address].   Also download the MyChart app! Go to the app store, search "MyChart", open the app, select [facility], and log in with your MyChart username and password.  Due to Covid, a mask is required upon entering the hospital/clinic. If you do not have a mask, one will be given to you upon arrival. For doctor visits, patients may have 1 support person aged 14 or older with them. For treatment visits, patients cannot have anyone with them due to current Covid guidelines and our immunocompromised population.

## 2021-07-27 ENCOUNTER — Ambulatory Visit (INDEPENDENT_AMBULATORY_CARE_PROVIDER_SITE_OTHER): Payer: Medicare Other | Admitting: Internal Medicine

## 2021-07-27 ENCOUNTER — Encounter: Payer: Self-pay | Admitting: Internal Medicine

## 2021-07-27 VITALS — BP 110/70 | HR 87 | Ht 66.0 in | Wt 142.8 lb

## 2021-07-27 DIAGNOSIS — E1042 Type 1 diabetes mellitus with diabetic polyneuropathy: Secondary | ICD-10-CM

## 2021-07-27 DIAGNOSIS — E1065 Type 1 diabetes mellitus with hyperglycemia: Secondary | ICD-10-CM | POA: Diagnosis not present

## 2021-07-27 LAB — POCT GLYCOSYLATED HEMOGLOBIN (HGB A1C): Hemoglobin A1C: 7.1 % — AB (ref 4.0–5.6)

## 2021-07-27 NOTE — Patient Instructions (Addendum)
-   Continue  Lantus 8 units ONCE daily  - Change Novolog 1 unit for every 15 grams of carbohydrates  -Novolog correctional insulin: ADD extra units on insulin to your meal-time Novolog dose if your blood sugars are higher than . Use the scale below to help guide you:   Blood sugar before meal Number of units to inject  Less than 190 0 unit  191 -  250 1 units  251 -  310 2 units  311 -  370 3 units  371 -  430 4 units  431 -  490 5 units  491 -  550 6 units   HOW TO TREAT LOW BLOOD SUGARS (Blood sugar LESS THAN 70 MG/DL) Please follow the RULE OF 15 for the treatment of hypoglycemia treatment (when your (blood sugars are less than 70 mg/dL)   STEP 1: Take 15 grams of carbohydrates when your blood sugar is low, which includes:  3-4 GLUCOSE TABS  OR 3-4 OZ OF JUICE OR REGULAR SODA OR ONE TUBE OF GLUCOSE GEL    STEP 2: RECHECK blood sugar in 15 MINUTES STEP 3: If your blood sugar is still low at the 15 minute recheck --> then, go back to STEP 1 and treat AGAIN with another 15 grams of carbohydrates.

## 2021-07-27 NOTE — Progress Notes (Signed)
Name: Luis [name]  MRN/ DOB: [id number], May 03, 1950   Age/ Sex: 71 y.o., male    PCP: [name], MD   Reason for Endocrinology Evaluation: Type 1 Diabetes Mellitus     Date of Initial Endocrinology Visit: 04/26/2021    PATIENT IDENTIFIER: [name] is a 71 y.o. male with a past medical history of T1DM, metastatic non-small cell lung cancer (Dx 2021) S/P right upper lobectomy. The patient presented for initial endocrinology clinic visit on 04/26/2021 for consultative assistance with his diabetes management.    HPI: Luis [name] was diagnosed with metastatic non-small cell lung cancer in 2021.  He is S/P right upper lobectomy in 06/2019, as well as initial adjuvant systemic chemotherapy with cisplatin and Alimta which was discontinued due to intolerance.  He is currently on immunotherapy Cempilimab every 3 weeks  During his follow-up with his oncologist on 04/25/2021 , he was having symptomatic hyperglycemia with polyuria and polydipsia.  Serum glucose was 500 mg/DL, and that's when  he was referred to endocrinology with a diagnosis of immune mediated diabetes mellitus  He was started on basal/prandial insulin regimen on his initial visit to our clinic in April 2023  SUBJECTIVE:   During the last visit (04/26/2021): A1c 9.8% started insulin  Today (07/27/21): Luis [name] is here for follow-up on diabetes management.  He is accompanied by his wife today.  He checks his blood sugars multiple  times daily, through CGM. The patient has  had hypoglycemic episodes since the last clinic visit, which typically occur at night , these have resolved since reducing the Lantus  He continues to follow-up with oncology for adjuvant chemotherapy due to lung cancer.  He is currently on immunotherapy  with Cempilimab every 3 weeks   Since his last visit here the patient has changed his Lantus and prandial dose of insulin because one of his family members has type 1 diabetes and he has been asking  them for medical advice.  He is not counting carbohydrates, and had not been taking his correction scale    HOME DIABETES REGIMEN: Lantus 10 units daily-takes 8 units daily  NovoLog 4 units 3 times daily before every meal- takes 1:10 CHO Correction factor: NovoLog (BG -130/60)-not using   Statin: no ACE-I/ARB: yes Prior Diabetic Education: no   CONTINUOUS GLUCOSE MONITORING RECORD INTERPRETATION    Dates of Recording: 7/1 - 07/27/2021  Sensor description: Dexcom G6  Results statistics:   CGM use % of time   Average and SD   Time in range        %  % Time Above 180   % Time above 250   % Time Below target     Glycemic patterns summary: Slight elevation in BG's overnight, worsening hyperglycemia during the day mainly postprandial  Hyperglycemic episodes mainly postprandial  Hypoglycemic episodes occurred during the day following a bolus  Overnight periods: Just below upper limit 209   DIABETIC COMPLICATIONS: Microvascular complications:  Chemo induced neuropathy Denies: CKD Last eye exam: needs one    Macrovascular complications:    Denies: CAD, PVD, CVA   PAST HISTORY: Past Medical History:  Past Medical History:  Diagnosis Date   Arthritis    through out body   Chronic bronchitis (HCC)    Concussion    COPD (chronic obstructive pulmonary disease) (HCC)    Diabetes mellitus without complication (HCC)    Dyspnea    Emphysema lung (HCC)    Fatigue    Frozen shoulder  LEFT   GERD (gastroesophageal reflux disease)    Hard of hearing    Headache    alot of days, related to spine issues   Hypertension    Lumbar radiculopathy 2013   nscl ca 04/2019   Peyronie's disease    Prostatitis 2011   Dr. Gaynelle Arabian   RLS (restless legs syndrome)    Shingles 06/13/2018   shingles on back, finished with prednisone, stills has scabs and pain   Thigh pain    Bilateral Thigh   Thigh pain 10/2011   bilateral   Tobacco abuse 2010   still uses electronic  cig/ emphysema, SOB easily   Vertigo    per wifes update   Wears partial dentures    upper   Past Surgical History:  Past Surgical History:  Procedure Laterality Date   CATARACT EXTRACTION W/PHACO Right 07/08/2018   Procedure: CATARACT EXTRACTION PHACO AND INTRAOCULAR LENS PLACEMENT ([place]) RIGHT;  Surgeon: Leandrew Koyanagi, MD;  Location: [facility];  Service: Ophthalmology;  Laterality: Right;   CATARACT EXTRACTION W/PHACO Left 07/29/2018   Procedure: CATARACT EXTRACTION PHACO AND INTRAOCULAR LENS PLACEMENT ([place]) LEFT TORIC LENS;  Surgeon: Leandrew Koyanagi, MD;  Location: [facility];  Service: Ophthalmology;  Laterality: Left;   CHEST TUBE INSERTION Right 07/01/2019   Procedure: CHEST TUBE INSERTION;  Surgeon: [name], MD;  Location: [facility];  Service: Thoracic;  Laterality: Right;   COLONOSCOPY     EYE SURGERY     INTERCOSTAL NERVE BLOCK Right 06/17/2019   Procedure: INTERCOSTAL NERVE BLOCK;  Surgeon: [name], MD;  Location: [facility];  Service: Thoracic;  Laterality: Right;   INTERCOSTAL NERVE BLOCK Right 07/01/2019   Procedure: INTERCOSTAL NERVE BLOCK;  Surgeon: [name], MD;  Location: [facility];  Service: Thoracic;  Laterality: Right;   IR IMAGING GUIDED PORT INSERTION  02/21/2020   TONSILLECTOMY     VIDEO ASSISTED THORACOSCOPY Right 07/01/2019   Procedure: VIDEO ASSISTED THORACOSCOPY - REMOVAL OF RIGHT MIDDLE LOBE BLEBS;  Surgeon: [name], MD;  Location: [facility];  Service: Thoracic;  Laterality: Right;   VIDEO BRONCHOSCOPY WITH INSERTION OF INTERBRONCHIAL VALVE (IBV) Right 06/25/2019   Procedure: VIDEO BRONCHOSCOPY WITH INSERTION OF INTERBRONCHIAL VALVE (IBV); Size 7 IBV into Right Loer Lobe (RLL) Superior Segment, Size 9 IBV into RLL Basilar Segment;  Surgeon: [name], MD;  Location[facility];  Service: Thoracic;  Laterality: Right;   VIDEO BRONCHOSCOPY WITH INSERTION OF INTERBRONCHIAL VALVE (IBV) N/A 08/06/2019    Procedure: VIDEO BRONCHOSCOPY WITH REMOVAL OF INTERBRONCHIAL VALVE (IBV);  Surgeon: [name], MD;  Location[facility];  Service: Thoracic;  Laterality: N/A;    Social History:  reports that he quit smoking about 12 years ago. His smoking use included cigarettes. He has a 80.00 pack-year smoking history. He has quit using smokeless tobacco. He reports current alcohol use of about 12.0 standard drinks of alcohol per week. He reports that he does not use drugs. Family History:  Family History  Problem Relation Age of Onset   Diabetes Mother    Hyperlipidemia Father    Hypertension Father      HOME MEDICATIONS: Allergies as of 07/27/2021       Reactions   Ativan [lorazepam] Other (See Comments)   hallucinations   Doxycycline Itching, Other (See Comments)   Itching/ redness   Tramadol Other (See Comments)   Hallucination        Medication List  Accurate as of July 27, 2021 11:11 AM. If you have any questions, ask your nurse or doctor.          Accu-Chek Guide test strip Generic drug: glucose blood See admin instructions.   acetaminophen 500 MG tablet Commonly known as: TYLENOL Take 1,000 mg by mouth at bedtime as needed for moderate pain.   albuterol 108 (90 Base) MCG/ACT inhaler Commonly known as: VENTOLIN HFA INHALE 1-2 PUFFS BY ORAL INHALATION EVERY 4 HOURS FOR CHRONIC OBSTRUCTIVE LUNG DISEASE BE SURE TO [place] MOUTHPIECE WITH WARM WATER ONCE A WEEK   amLODipine 5 MG tablet Commonly known as: NORVASC TAKE ONE-HALF TABLET BY MOUTH EVERY DAY FOR BLOOD PRESSURE. NOTE NEW MEDICINE   b complex vitamins capsule Take 1 capsule by mouth daily.   citalopram 20 MG tablet Commonly known as: CELEXA Take 1 tablet by mouth daily.   Dexcom G6 Sensor Misc 1 Device by Does not apply route as directed.   Dexcom G6 Transmitter Misc 1 Device by Does not apply route as directed.   fluticasone-salmeterol 250-50 MCG/ACT Aepb Commonly known as: ADVAIR 1 puff    Insulin Pen Needle 32G X 4 MM Misc 1 Device by Does not apply route in the morning, at noon, in the evening, and at bedtime.   Lantus SoloStar 100 UNIT/ML Solostar Pen Generic drug: insulin glargine Inject 10 Units into the skin daily. What changed: how much to take   lidocaine-prilocaine cream Commonly known as: EMLA Apply 1 application topically as needed. Apply 1 tsp over port site at least 45 minutes prior to lab appointment.Do not rub in cream. Cover with plastic wrap.   losartan 50 MG tablet Commonly known as: COZAAR TAKE ONE TABLET BY MOUTH EVERY DAY FOR HYPERTENSION   multivitamin tablet Take 1 tablet by mouth 2 (two) times a week.   NovoLOG FlexPen 100 UNIT/ML FlexPen Generic drug: insulin aspart Max daily 30 units What changed:  how much to take when to take this additional instructions   valsartan-hydrochlorothiazide 80-12.5 MG tablet Commonly known as: DIOVAN-HCT Take 1 tablet by mouth daily.   vitamin B-12 500 MCG tablet Commonly known as: CYANOCOBALAMIN Take 500 mcg by mouth daily.   Vitamin D 125 MCG (5000 UT) Caps Take 5,000 Units by mouth daily.         ALLERGIES: Allergies  Allergen Reactions   Ativan [Lorazepam] Other (See Comments)    hallucinations   Doxycycline Itching and Other (See Comments)    Itching/ redness   Tramadol Other (See Comments)    Hallucination       OBJECTIVE:   VITAL SIGNS: BP 110/70 (BP Location: Left Arm, Patient Position: Sitting, Cuff Size: Small)   Pulse 87   Ht 5\' 6"  (1.676 m)   Wt 142 lb 12.8 oz (64.8 kg)   SpO2 95%   BMI 23.05 kg/m    PHYSICAL EXAM:  General: Pt appears well and is in NAD  Neck: General: Supple without adenopathy or carotid bruits. Thyroid: Thyroid size normal.  No goiter or nodules appreciated.   Lungs: Clear with good BS bilat with no rales, rhonchi, or wheezes  Heart: RRR with normal S1 and S2 and no gallops; no murmurs; no rub  Abdomen: Normoactive bowel sounds, soft,  nontender, without masses or organomegaly palpable  Extremities:  Lower extremities - No pretibial edema. No lesions.  Neuro: MS is good with appropriate affect, pt is alert and Ox3    DM foot exam: 04/26/2021  The skin of the  feet is intact without sores or ulcerations. The pedal pulses are 2+ on right and 2+ on left. The sensation is intact to a screening 5.07, 10 gram monofilament bilaterally    DATA REVIEWED:  Lab Results  Component Value Date   HGBA1C 7.1 (A) 07/27/2021   HGBA1C 9.8 (A) 04/26/2021   Lab Results  Component Value Date   CREATININE 1.20 07/18/2021    Latest Reference Range & Units 04/25/21 10:18  Sodium 135 - 145 mmol/L 129 (L)  Potassium 3.5 - 5.1 mmol/L 4.6  Chloride 98 - 111 mmol/L 93 (L)  CO2 22 - 32 mmol/L 23  Glucose 70 - 99 mg/dL 500 (H)  BUN 8 - 23 mg/dL 24 (H)  Creatinine 0.61 - 1.24 mg/dL 1.42 (H)  Calcium 8.9 - 10.3 mg/dL 9.4  Anion gap 5 - 15  13  Alkaline Phosphatase 38 - 126 U/L 60  Albumin 3.5 - 5.0 g/dL 4.5  AST 15 - 41 U/L 12 (L)  ALT 0 - 44 U/L 15  Total Protein 6.5 - 8.1 g/dL 7.2  Total Bilirubin 0.3 - 1.2 mg/dL 0.8  GFR, Est Non African American >60 mL/min 53 (L)   ASSESSMENT / PLAN / RECOMMENDATIONS:   1) Type 1 Diabetes Mellitus, Newly Diagnosed , With neuropathic  complications - Most recent A1c of 7.1 %. Goal A1c <7.0%.    - This has been attributed to immunotherapy and destruction of pancreatic cells -I have praised the patient on improved glycemic control, and counting his carbohydrates -He initially had hypoglycemic episodes, but instead of reaching to our office he was reaching out to a family member who has type 1 diabetes, this unfortunately has changed his diabetes regimen and now he has hyperglycemia on the EchoStar. -He initially stated that he has been counting his carbohydrates and uses 1 unit of insulin for every 10 g of carbohydrates, but in reviewing of his recordkeeping it appears that if his BG's are in  the low 100s or less he will skip prandial dose of insulin resulting in postprandial hyperglycemia, we have used these examples to educate the patient on the importance of taking prandial dose of insulin and being consistent with carb counting -He also has not been using correction scale, so I have encouraged him to start using correction scale for preprandial glucose readings -I will make the following changes to his insulin to carb ratio, and I have encouraged the patient that he needs to stay consistent with the carbohydrate counting and taking the insulin otherwise will be difficult to as contain the right dose of prandial insulin for him if he skips it or self adjusts it -Patient encouraged to contact us with issues regarding his insulin regimen rather than family members   MEDICATIONS: -Continue Lantus 8 units daily -Change NovoLog I:C ratio of 1:15  -Start using correction factor: NovoLog (BG -130/60)  EDUCATION / INSTRUCTIONS: BG monitoring instructions: Patient is instructed to check his blood sugars 3 times a day, before meals . Call [facility] if: BG persistently < 70  I reviewed the Rule of 15 for the treatment of hypoglycemia in detail with the patient. Literature supplied.   2) Diabetic complications:  Eye: Does not have known diabetic retinopathy.  Patient urged to have an eye exam Neuro/ Feet: Does  have known diabetic peripheral neuropathy. Renal: Patient does not have known baseline CKD. He is  on an ACEI/ARB at present.  Follow-up in 4 months  Signed electronically by: Elenora Gamma  Kelton Pillar, MD  [facility]  [facility] [address] [place], [place] [address] Phone: [phone] FAX: [phone]   CC: [name], MD [address] [address] Phone: [phone]  Fax: [phone]    Return to Endocrinology clinic as below: Future Appointments  Date Time Provider [place]  08/08/2021  1:00 PM [place] None  08/08/2021  1:30 PM Curt Bears, MD [facility] None  08/08/2021  2:30 PM [facility] INFUSION [facility] None  08/29/2021 10:30 AM [facility] [place] FLUSH [facility] None  08/29/2021 11:00 AM Heilingoetter, Cassandra L, PA-C [facility] None  08/29/2021 12:00 PM [facility] INFUSION [facility] None  09/19/2021  9:30 AM [facility] [place] FLUSH [facility] None  09/19/2021 10:00 AM Heilingoetter, Cassandra L, PA-C [facility] None  09/19/2021 11:00 AM [facility] INFUSION [facility] None  11/08/2021 11:30 AM [place] Golob, Melanie Crazier, MD [facility] None

## 2021-08-05 ENCOUNTER — Encounter: Payer: Self-pay | Admitting: Internal Medicine

## 2021-08-06 ENCOUNTER — Other Ambulatory Visit: Payer: Self-pay

## 2021-08-06 MED ORDER — NOVOLOG FLEXPEN 100 UNIT/ML ~~LOC~~ SOPN: PEN_INJECTOR | SUBCUTANEOUS | 11 refills | Status: DC

## 2021-08-08 ENCOUNTER — Encounter: Payer: Self-pay | Admitting: Internal Medicine

## 2021-08-08 ENCOUNTER — Inpatient Hospital Stay: Payer: Medicare Other

## 2021-08-08 ENCOUNTER — Other Ambulatory Visit: Payer: Self-pay

## 2021-08-08 ENCOUNTER — Inpatient Hospital Stay (HOSPITAL_BASED_OUTPATIENT_CLINIC_OR_DEPARTMENT_OTHER): Payer: Medicare Other | Admitting: Internal Medicine

## 2021-08-08 VITALS — BP 113/71 | HR 86 | Temp 97.2°F | Resp 17 | Wt 142.1 lb

## 2021-08-08 VITALS — BP 115/69 | HR 69 | Resp 18

## 2021-08-08 DIAGNOSIS — C3490 Malignant neoplasm of unspecified part of unspecified bronchus or lung: Secondary | ICD-10-CM

## 2021-08-08 DIAGNOSIS — J449 Chronic obstructive pulmonary disease, unspecified: Secondary | ICD-10-CM | POA: Diagnosis not present

## 2021-08-08 DIAGNOSIS — C782 Secondary malignant neoplasm of pleura: Secondary | ICD-10-CM | POA: Diagnosis not present

## 2021-08-08 DIAGNOSIS — E109 Type 1 diabetes mellitus without complications: Secondary | ICD-10-CM | POA: Diagnosis not present

## 2021-08-08 DIAGNOSIS — Z5112 Encounter for antineoplastic immunotherapy: Secondary | ICD-10-CM | POA: Diagnosis not present

## 2021-08-08 DIAGNOSIS — R5383 Other fatigue: Secondary | ICD-10-CM

## 2021-08-08 DIAGNOSIS — Z794 Long term (current) use of insulin: Secondary | ICD-10-CM | POA: Diagnosis not present

## 2021-08-08 DIAGNOSIS — C3491 Malignant neoplasm of unspecified part of right bronchus or lung: Secondary | ICD-10-CM

## 2021-08-08 DIAGNOSIS — Z5111 Encounter for antineoplastic chemotherapy: Secondary | ICD-10-CM

## 2021-08-08 DIAGNOSIS — C3411 Malignant neoplasm of upper lobe, right bronchus or lung: Secondary | ICD-10-CM | POA: Diagnosis not present

## 2021-08-08 LAB — CMP (CANCER CENTER ONLY)
ALT: 11 U/L (ref 0–44)
AST: 11 U/L — ABNORMAL LOW (ref 15–41)
Albumin: 4.1 g/dL (ref 3.5–5.0)
Alkaline Phosphatase: 38 U/L (ref 38–126)
Anion gap: 3 — ABNORMAL LOW (ref 5–15)
BUN: 21 mg/dL (ref 8–23)
CO2: 29 mmol/L (ref 22–32)
Calcium: 9 mg/dL (ref 8.9–10.3)
Chloride: 101 mmol/L (ref 98–111)
Creatinine: 1.15 mg/dL (ref 0.61–1.24)
GFR, Estimated: >60 mL/min (ref >60)
Glucose, Bld: 335 mg/dL — ABNORMAL HIGH (ref 70–99)
Potassium: 4.9 mmol/L (ref 3.5–5.1)
Sodium: 133 mmol/L — ABNORMAL LOW (ref 135–145)
Total Bilirubin: 0.5 mg/dL (ref 0.3–1.2)
Total Protein: 6.4 g/dL — ABNORMAL LOW (ref 6.5–8.1)

## 2021-08-08 LAB — CBC WITH DIFFERENTIAL (CANCER CENTER ONLY)
Abs Immature Granulocytes: 0.03 10*3/uL (ref 0.00–0.07)
Basophils Absolute: 0.1 10*3/uL (ref 0.0–0.1)
Basophils Relative: 1 %
Eosinophils Absolute: 0.2 10*3/uL (ref 0.0–0.5)
Eosinophils Relative: 3 %
HCT: 40.9 % (ref 39.0–52.0)
Hemoglobin: 14.5 g/dL (ref 13.0–17.0)
Immature Granulocytes: 0 %
Lymphocytes Relative: 17 %
Lymphs Abs: 1.3 10*3/uL (ref 0.7–4.0)
MCH: 30.3 pg (ref 26.0–34.0)
MCHC: 35.5 g/dL (ref 30.0–36.0)
MCV: 85.6 fL (ref 80.0–100.0)
Monocytes Absolute: 0.5 10*3/uL (ref 0.1–1.0)
Monocytes Relative: 6 %
Neutro Abs: 5.5 10*3/uL (ref 1.7–7.7)
Neutrophils Relative %: 73 %
Platelet Count: 239 10*3/uL (ref 150–400)
RBC: 4.78 MIL/uL (ref 4.22–5.81)
RDW: 14 % (ref 11.5–15.5)
WBC Count: 7.6 10*3/uL (ref 4.0–10.5)
nRBC: 0 % (ref 0.0–0.2)

## 2021-08-08 LAB — TSH: TSH: 1.251 u[IU]/mL (ref 0.350–4.500)

## 2021-08-08 MED ORDER — SODIUM CHLORIDE 0.9 % IV SOLN: Freq: Once | INTRAVENOUS | Status: AC

## 2021-08-08 MED ORDER — SODIUM CHLORIDE 0.9% FLUSH
10.0000 mL | INTRAVENOUS | Status: DC | PRN
  Administered 2021-08-08: 10 mL

## 2021-08-08 MED ORDER — SODIUM CHLORIDE 0.9 % IV SOLN
350.0000 mg | Freq: Once | INTRAVENOUS | Status: AC
  Administered 2021-08-08: 350 mg via INTRAVENOUS
  Filled 2021-08-08: qty 7

## 2021-08-08 MED ORDER — HEPARIN SOD (PORK) LOCK FLUSH 100 UNIT/ML IV SOLN
500.0000 [IU] | Freq: Once | INTRAVENOUS | Status: AC | PRN
  Administered 2021-08-08: 500 [IU]

## 2021-08-08 NOTE — Patient Instructions (Signed)
[facility]  Discharge Instructions: Thank you for choosing [facility] to provide your oncology and hematology care.   If you have a lab appointment with the [facility], please go directly to the [facility] and check in at the registration area.   Wear comfortable clothing and clothing appropriate for easy access to any Portacath or PICC line.   We strive to give you quality time with your provider. You may need to reschedule your appointment if you arrive late (15 or more minutes).  Arriving late affects you and other patients whose appointments are after yours.  Also, if you miss three or more appointments without notifying the office, you may be dismissed from the clinic at the provider's discretion.      For prescription refill requests, have your pharmacy contact our office and allow 72 hours for refills to be completed.    Today you received the following chemotherapy and/or immunotherapy agents: Libtayo.      To help prevent nausea and vomiting after your treatment, we encourage you to take your nausea medication as directed.  BELOW ARE SYMPTOMS THAT SHOULD BE REPORTED IMMEDIATELY: *FEVER GREATER THAN 100.4 F (38 C) OR HIGHER *CHILLS OR SWEATING *NAUSEA AND VOMITING THAT IS NOT CONTROLLED WITH YOUR NAUSEA MEDICATION *UNUSUAL SHORTNESS OF BREATH *UNUSUAL BRUISING OR BLEEDING *URINARY PROBLEMS (pain or burning when urinating, or frequent urination) *BOWEL PROBLEMS (unusual diarrhea, constipation, pain near the anus) TENDERNESS IN MOUTH AND THROAT WITH OR WITHOUT PRESENCE OF ULCERS (sore throat, sores in mouth, or a toothache) UNUSUAL RASH, SWELLING OR PAIN  UNUSUAL VAGINAL DISCHARGE OR ITCHING   Items with * indicate a potential emergency and should be followed up as soon as possible or go to the Emergency Department if any problems should occur.  Please show the CHEMOTHERAPY ALERT CARD or IMMUNOTHERAPY ALERT CARD at check-in to  the Emergency Department and triage nurse.  Should you have questions after your visit or need to cancel or reschedule your appointment, please contact [facility]  Dept: [phone]  and follow the prompts.  Office hours are 8:00 a.m. to 4:30 p.m. Monday - Friday. Please note that voicemails left after 4:00 p.m. may not be returned until the following business day.  We are closed weekends and major holidays. You have access to a nurse at all times for urgent questions. Please call the main number to the clinic Dept: [phone] and follow the prompts.   For any non-urgent questions, you may also contact your provider using MyChart. We now offer e-Visits for anyone 42 and older to request care online for non-urgent symptoms. For details visit [email or web address].   Also download the MyChart app! Go to the app store, search "MyChart", open the app, select [facility], and log in with your MyChart username and password.  Masks are optional in the cancer centers. If you would like for your care team to wear a mask while they are taking care of you, please let them know. For doctor visits, patients may have with them one support person who is at least 70 years old. At this time, visitors are not allowed in the infusion area.

## 2021-08-08 NOTE — Progress Notes (Signed)
[place] [phone]   [phone]  OFFICE PROGRESS NOTE  [name], MD [address] [place] [address]  DIAGNOSIS:  1) Metastatic non-small cell lung cancer initially diagnosed as stage IIB (T1b, N1, M0) non-small cell lung cancer, invasive poorly differentiated carcinoma diagnosed in June 2021 with disease recurrence in November 2021 Molecular studies are negative for EGFR mutation as well as ALK gene translocation.  PD-L1 expression was 50-100%.  This is based on the report from the [employer] trial. Molecular studies by [employer]: Showed no actionable mutations and PD-L1 expression was 90% 2) immunotherapy induced type 1 diabetes mellitus.  PRIOR THERAPY:  1) Status post right upper lobectomy with lymph node dissection on June 17, 2019 under the care of Dr. Roxan Hockey. 2) Adjuvant systemic chemotherapy with cisplatin 75 mg/M2 and Alimta 500 mg/M2 every 3 weeks.  First dose September 02, 2019. Status post 3 cycles.  His treatment was discontinued secondary to intolerance.  CURRENT THERAPY: First-line treatment with immunotherapy with Libtayo (Cempilimab) 350 mg IV every 3 weeks status post 26 cycles.  INTERVAL HISTORY: Luis [name] 71 y.o. male returns to the clinic today for follow-up visit.  The patient is feeling fine today with no concerning complaints.  He denied having any chest pain, shortness of breath, cough or hemoptysis.  He denied having any nausea, vomiting, diarrhea or constipation.  He has no headache or visual changes.  He denied having any weight loss or night sweats.  The patient has no fever or chills.  He continues to tolerate his treatment with Libtayo (Cempilimab) fairly well.  He is here today for evaluation before starting cycle #27.   MEDICAL HISTORY: Past Medical History:  Diagnosis Date   Arthritis    through out body   Chronic bronchitis (HCC)    Concussion    COPD (chronic obstructive  pulmonary disease) (HCC)    Diabetes mellitus without complication (HCC)    Dyspnea    Emphysema lung (HCC)    Fatigue    Frozen shoulder    LEFT   GERD (gastroesophageal reflux disease)    Hard of hearing    Headache    alot of days, related to spine issues   Hypertension    Lumbar radiculopathy 2013   nscl ca 04/2019   Peyronie's disease    Prostatitis 2011   Dr. Gaynelle Arabian   RLS (restless legs syndrome)    Shingles 06/13/2018   shingles on back, finished with prednisone, stills has scabs and pain   Thigh pain    Bilateral Thigh   Thigh pain 10/2011   bilateral   Tobacco abuse 2010   still uses electronic cig/ emphysema, SOB easily   Vertigo    per wifes update   Wears partial dentures    upper    ALLERGIES:  is allergic to ativan [lorazepam], doxycycline, and tramadol.  MEDICATIONS:  Current Outpatient Medications  Medication Sig Dispense Refill   acetaminophen (TYLENOL) 500 MG tablet Take 1,000 mg by mouth at bedtime as needed for moderate pain.     albuterol (VENTOLIN HFA) 108 (90 Base) MCG/ACT inhaler INHALE 1-2 PUFFS BY ORAL INHALATION EVERY 4 HOURS FOR CHRONIC OBSTRUCTIVE LUNG DISEASE BE SURE TO WASH MOUTHPIECE WITH WARM WATER ONCE A WEEK     amLODipine (NORVASC) 5 MG tablet TAKE ONE-HALF TABLET BY MOUTH EVERY DAY FOR BLOOD PRESSURE. NOTE NEW MEDICINE     b complex vitamins capsule Take 1  capsule by mouth daily.     Cholecalciferol (VITAMIN D) 125 MCG (5000 UT) CAPS Take 5,000 Units by mouth daily.     citalopram (CELEXA) 20 MG tablet Take 1 tablet by mouth daily.     Continuous Blood Gluc Sensor (DEXCOM G6 SENSOR) MISC 1 Device by Does not apply route as directed. 9 each 3   Continuous Blood Gluc Transmit (DEXCOM G6 TRANSMITTER) MISC 1 Device by Does not apply route as directed. 1 each 3   fluticasone-salmeterol (ADVAIR) 250-50 MCG/ACT AEPB 1 puff     glucose blood (ACCU-CHEK GUIDE) test strip See admin instructions.     insulin aspart (NOVOLOG FLEXPEN) 100  UNIT/ML FlexPen Max daily 30 units 15 mL 11   insulin glargine (LANTUS SOLOSTAR) 100 UNIT/ML Solostar Pen Inject 10 Units into the skin daily. (Patient taking differently: Inject 8 Units into the skin daily.) 15 mL 6   Insulin Pen Needle 32G X 4 MM MISC 1 Device by Does not apply route in the morning, at noon, in the evening, and at bedtime. 400 each 3   lidocaine-prilocaine (EMLA) cream Apply 1 application topically as needed. Apply 1 tsp over port site at least 45 minutes prior to lab appointment.Do not rub in cream. Cover with plastic wrap. 30 g 0   losartan (COZAAR) 50 MG tablet TAKE ONE TABLET BY MOUTH EVERY DAY FOR HYPERTENSION     Multiple Vitamin (MULTIVITAMIN) tablet Take 1 tablet by mouth 2 (two) times a week.     valsartan-hydrochlorothiazide (DIOVAN-HCT) 80-12.5 MG tablet Take 1 tablet by mouth daily.     vitamin B-12 (CYANOCOBALAMIN) 500 MCG tablet Take 500 mcg by mouth daily.     No current facility-administered medications for this visit.   Facility-Administered Medications Ordered in Other Visits  Medication Dose Route Frequency Provider Last Rate Last Admin   sodium chloride flush (NS) 0.9 % injection 10 mL  10 mL Intracatheter PRN Curt Bears, MD   10 mL at 08/08/21 1318    SURGICAL HISTORY:  Past Surgical History:  Procedure Laterality Date   CATARACT EXTRACTION W/PHACO Right 07/08/2018   Procedure: CATARACT EXTRACTION PHACO AND INTRAOCULAR LENS PLACEMENT ([place]) RIGHT;  Surgeon: Leandrew Koyanagi, MD;  Location: [facility];  Service: Ophthalmology;  Laterality: Right;   CATARACT EXTRACTION W/PHACO Left 07/29/2018   Procedure: CATARACT EXTRACTION PHACO AND INTRAOCULAR LENS PLACEMENT ([place]) LEFT TORIC LENS;  Surgeon: Leandrew Koyanagi, MD;  Location: [facility];  Service: Ophthalmology;  Laterality: Left;   CHEST TUBE INSERTION Right 07/01/2019   Procedure: CHEST TUBE INSERTION;  Surgeon: [name], MD;  Location: [facility];  Service: Thoracic;   Laterality: Right;   COLONOSCOPY     EYE SURGERY     INTERCOSTAL NERVE BLOCK Right 06/17/2019   Procedure: INTERCOSTAL NERVE BLOCK;  Surgeon: [name], MD;  Location: [facility];  Service: Thoracic;  Laterality: Right;   INTERCOSTAL NERVE BLOCK Right 07/01/2019   Procedure: INTERCOSTAL NERVE BLOCK;  Surgeon: [name], MD;  Location: [facility];  Service: Thoracic;  Laterality: Right;   IR IMAGING GUIDED PORT INSERTION  02/21/2020   TONSILLECTOMY     VIDEO ASSISTED THORACOSCOPY Right 07/01/2019   Procedure: VIDEO ASSISTED THORACOSCOPY - REMOVAL OF RIGHT MIDDLE LOBE BLEBS;  Surgeon: [name], MD;  Location: [facility];  Service: Thoracic;  Laterality: Right;   VIDEO BRONCHOSCOPY WITH INSERTION OF INTERBRONCHIAL VALVE (IBV) Right 06/25/2019   Procedure: VIDEO BRONCHOSCOPY WITH INSERTION OF INTERBRONCHIAL VALVE (IBV); Size 7  IBV into Right Loer Lobe (RLL) Superior Segment, Size 9 IBV into RLL Basilar Segment;  Surgeon: [name], MD;  Location: [facility];  Service: Thoracic;  Laterality: Right;   VIDEO BRONCHOSCOPY WITH INSERTION OF INTERBRONCHIAL VALVE (IBV) N/A 08/06/2019   Procedure: VIDEO BRONCHOSCOPY WITH REMOVAL OF INTERBRONCHIAL VALVE (IBV);  Surgeon: [name], MD;  Location: [facility];  Service: Thoracic;  Laterality: N/A;    REVIEW OF SYSTEMS:  A comprehensive review of systems was negative except for: Constitutional: positive for fatigue   PHYSICAL EXAMINATION: General appearance: alert, cooperative, and no distress Head: Normocephalic, without obvious abnormality, atraumatic Neck: no adenopathy, no JVD, supple, symmetrical, trachea midline, and thyroid not enlarged, symmetric, no tenderness/mass/nodules Lymph nodes: Cervical, supraclavicular, and axillary nodes normal. Resp: clear to auscultation bilaterally Back: symmetric, no curvature. ROM normal. No CVA tenderness. Cardio: regular rate and rhythm, S1, S2 normal, no murmur, click, rub or  gallop GI: soft, non-tender; bowel sounds normal; no masses,  no organomegaly Extremities: extremities normal, atraumatic, no cyanosis or edema  ECOG PERFORMANCE STATUS: 1 - Symptomatic but completely ambulatory  Blood pressure 113/71, pulse 86, temperature (!) 97.2 F (36.2 C), temperature source Tympanic, resp. rate 17, weight 142 lb 1 oz (64.4 kg), SpO2 96 %.  LABORATORY DATA: Lab Results  Component Value Date   WBC 7.5 07/18/2021   HGB 14.5 07/18/2021   HCT 41.0 07/18/2021   MCV 86.3 07/18/2021   PLT 188 07/18/2021      Chemistry      Component Value Date/Time   NA 134 (L) 07/18/2021 0928   K 4.4 07/18/2021 0928   CL 103 07/18/2021 0928   CO2 27 07/18/2021 0928   BUN 22 07/18/2021 0928   CREATININE 1.20 07/18/2021 0928      Component Value Date/Time   CALCIUM 8.8 (L) 07/18/2021 0928   ALKPHOS 41 07/18/2021 0928   AST 12 (L) 07/18/2021 0928   ALT 13 07/18/2021 0928   BILITOT 0.6 07/18/2021 0928       RADIOGRAPHIC STUDIES: No results found.  ASSESSMENT AND PLAN: This is a very pleasant 71 years old white male recently diagnosed with stage IIb (T1b, N1, M0) non-small cell lung cancer, adenocarcinoma status post right upper lobectomy with lymph node dissection under the care of Dr. Roxan Hockey on June 17, 2019. The patient had molecular studies on the [facility] clinical trial that showed negative for EGFR mutation as well as ALK gene translocation but PD-L1 expression in the range of 50-100%. The patient underwent standard adjuvant systemic chemotherapy with cisplatin 75 mg/M2 and Alimta 500 mg/M2 every 3 weeks status post 3 cycles.  His treatment was discontinued secondary to intolerance. Unfortunately repeat CT scan as well as PET scan after completion of the adjuvant chemotherapy showed concerning findings for disease recurrence with small right pleural effusion as well as multiple pleural implants consistent with metastatic disease which was confirmed with repeat  biopsy. He had molecular studies by [employer] and the blood test showed no actionable mutation.  His PD-L1 expression was 90%. The patient is currently undergoing treatment with immunotherapy with Libtayo (Cempilimab) 350 mg IV every 3 weeks status post 26 cycles. He was diagnosed with immunotherapy mediated type 1 diabetes mellitus and currently on treatment with insulin. He has been tolerating this treatment well with no concerning adverse effects. I recommended for him to proceed with cycle #27 today as planned. I will see him back for follow-up visit in 3 weeks for evaluation with repeat CT  scan of the chest, abdomen and pelvis for restaging of his disease before the next cycle of his treatment. The patient was advised to call immediately if he has any concerning symptoms in the interval.  The patient voices understanding of current disease status and treatment options and is in agreement with the current care plan.  All questions were answered. The patient knows to call the clinic with any problems, questions or concerns. We can certainly see the patient much sooner if necessary.  Disclaimer: This note was dictated with voice recognition software. Similar sounding words can inadvertently be transcribed and may not be corrected upon review.

## 2021-08-15 DIAGNOSIS — M3501 Sicca syndrome with keratoconjunctivitis: Secondary | ICD-10-CM | POA: Diagnosis not present

## 2021-08-22 IMAGING — DX DG CHEST 1V PORT
1 series · 1 of 1 positions shown · non-contrast
Comparison: Chest radiograph 06/18/2019.

CLINICAL DATA: Status post lobectomy.

EXAM:
PORTABLE CHEST 1 VIEW

[chest]
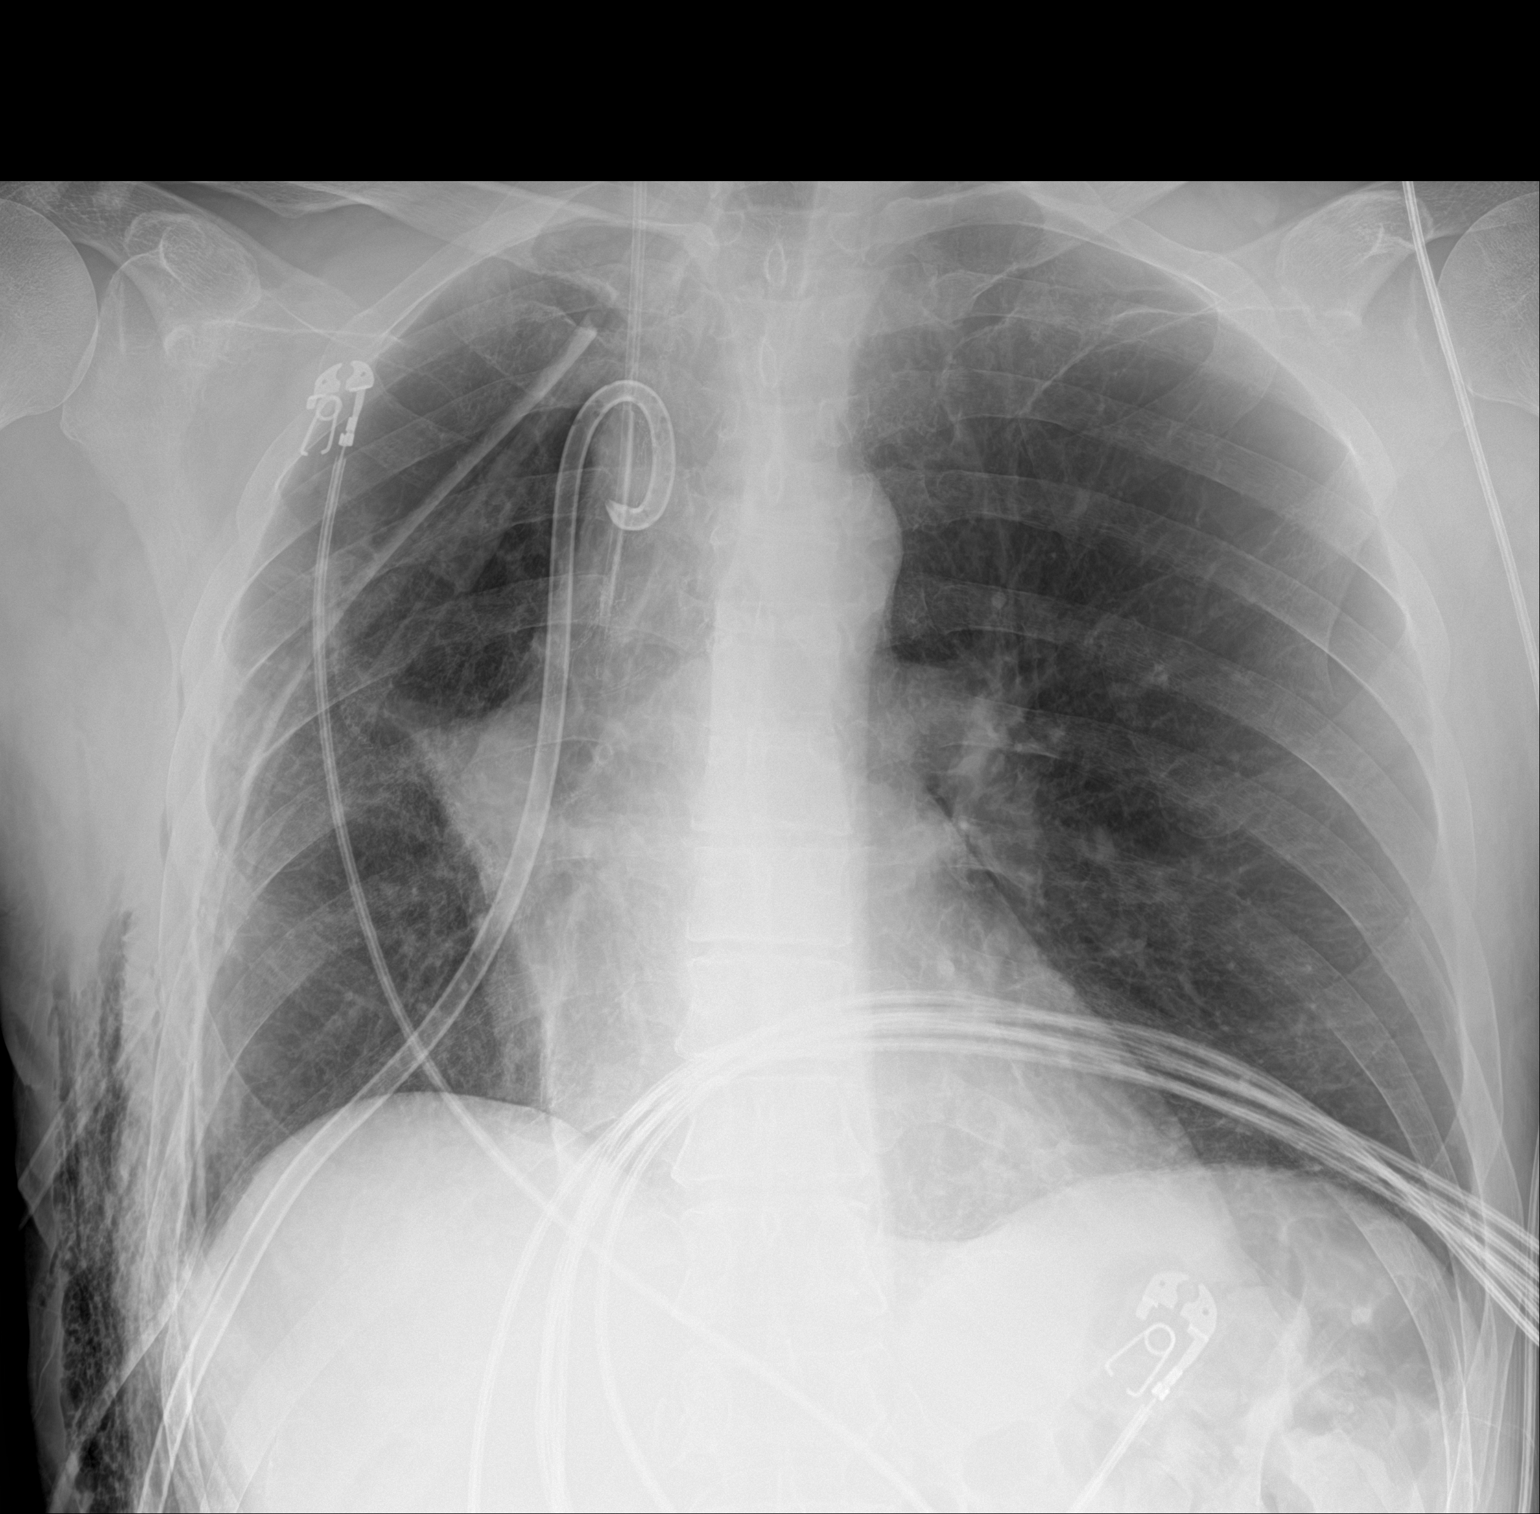

[1 of 1 positions shown; findings below may reference images not displayed]

FINDINGS: Right chest tubes remain in position. Monitoring leads overlie the
patient. Right IJ central venous catheter tip projects over the
superior vena cava. Stable cardiac and mediastinal contours.
Redemonstrated postsurgical changes right hemithorax. Interval Re
aeration of the residual right lung. Possible trace right apical
pneumothorax. Right chest wall subcutaneous emphysema. Clear left
lung.
IMPRESSION: Improved aeration of the residual right lung with possible trace
right apical pneumothorax. Right chest tubes remain in place.

## 2021-08-22 NOTE — Progress Notes (Signed)
[place] [address]  DIAGNOSIS: Metastatic non-small cell lung cancer initially diagnosed as stage IIB (T1b, N1, M0) non-small cell lung cancer, invasive poorly differentiated carcinoma diagnosed in June 2021 with disease recurrence in November 2021 Molecular studies are negative for EGFR mutation as well as ALK gene translocation.  PD-L1 expression was 50-100%.  This is based on the report from the [facility] trial. Molecular studies by [facility]: Showed no actionable mutations and PD-L1 expression was 90%  PRIOR THERAPY: 1) Status post right upper lobectomy with lymph node dissection on June 17, 2019 under the care of Dr. Roxan Hockey. 2) Adjuvant systemic chemotherapy with cisplatin 75 mg/M2 and Alimta 500 mg/M2 every 3 weeks.  First dose September 02, 2019. Status post 3 cycles.  His treatment was discontinued secondary to intolerance.  CURRENT THERAPY: First-line treatment with immunotherapy with Libtayo (Cempilimab) 350 mg IV every 3 weeks status post 27 cycles. First dose on 01/24/20.  INTERVAL HISTORY: Luis [name] 71 y.o. male returns to the clinic today for a follow-up visit.  The patient is feeling fairly well today without any concerning complaints.  The patient is currently undergoing immunotherapy with Libtayo.  He is tolerating this well except he did develop type 1 diabetes for which he is seeing endocrinology and taking insulin. He took 8 units in the morning and then another 5 units of novolog. He has a monitor which at the time of his encounter showed his BS at 320. The patient sometimes may develop a mild itching without rash for which he will use hydrocortisone cream if needed. He reports he has contact dermatitis.   The patient had a routine staging CT scan which incidentally noted possible acute diverticulitis. The patient denies any abdominal pain today.  States he sometimes may have a mild  suprapubic discomfort intermittently over the last month or so that will resolve spontaneously.  At the worst, his pain will be a 4 out of 10.  Denies any fever, changes in his baseline bowel habits, chills, or blood in the stool.  His last bowel movement was yesterday which was his normal color and consistency.  The patient does have intermittent diarrhea but has not noticed a change in his diarrhea recently.  He did have some diarrhea after drinking the oral contrast for his recent scan.  Today, he states he is fairly well.  He lost a few pounds after drinking the oral contrast from his scan.  He reports his baseline dyspnea exertion secondary to his COPD.  He sometimes has occasional chest discomfort along his prior surgical site in the right chest wall.  Denies any cough.  He reports stable dyspnea on exertion.  Denies hemoptysis.  Denies any nausea, vomiting, or constipation.  He sometimes may have an occasional headache in the occipital region which he states is also chronic secondary to disc issues in the cervical spine.  He reports he has had a similar headache for the last 3 years.  Patient recently had a restaging CT scan performed.  The patient is here today for evaluation and to review his scan results before undergoing cycle #28.    MEDICAL HISTORY: Past Medical History:  Diagnosis Date   Arthritis    through out body   Chronic bronchitis (HCC)    Concussion    COPD (chronic obstructive pulmonary disease) (HCC)    Diabetes mellitus without complication (HCC)    Dyspnea  Emphysema lung (HCC)    Fatigue    Frozen shoulder    LEFT   GERD (gastroesophageal reflux disease)    Hard of hearing    Headache    alot of days, related to spine issues   Hypertension    Lumbar radiculopathy 2013   nscl ca 04/2019   Peyronie's disease    Prostatitis 2011   Dr. Gaynelle Arabian   RLS (restless legs syndrome)    Shingles 06/13/2018   shingles on back, finished with prednisone, stills has scabs  and pain   Thigh pain    Bilateral Thigh   Thigh pain 10/2011   bilateral   Tobacco abuse 2010   still uses electronic cig/ emphysema, SOB easily   Vertigo    per wifes update   Wears partial dentures    upper    ALLERGIES:  is allergic to ativan [lorazepam], doxycycline, and tramadol.  MEDICATIONS:  Current Outpatient Medications  Medication Sig Dispense Refill   acetaminophen (TYLENOL) 500 MG tablet Take 1,000 mg by mouth at bedtime as needed for moderate pain.     albuterol (VENTOLIN HFA) 108 (90 Base) MCG/ACT inhaler INHALE 1-2 PUFFS BY ORAL INHALATION EVERY 4 HOURS FOR CHRONIC OBSTRUCTIVE LUNG DISEASE BE SURE TO WASH MOUTHPIECE WITH WARM WATER ONCE A WEEK     amLODipine (NORVASC) 5 MG tablet TAKE ONE-HALF TABLET BY MOUTH EVERY DAY FOR BLOOD PRESSURE. NOTE NEW MEDICINE     b complex vitamins capsule Take 1 capsule by mouth daily.     Cholecalciferol (VITAMIN D) 125 MCG (5000 UT) CAPS Take 5,000 Units by mouth daily.     citalopram (CELEXA) 20 MG tablet Take 1 tablet by mouth daily.     Continuous Blood Gluc Receiver ([place]) DEVI by Does not apply route.     Continuous Blood Gluc Sensor (DEXCOM G7 SENSOR) MISC by Does not apply route.     fluticasone-salmeterol (ADVAIR) 250-50 MCG/ACT AEPB 1 puff     glucose blood (ACCU-CHEK GUIDE) test strip See admin instructions.     insulin aspart (NOVOLOG FLEXPEN) 100 UNIT/ML FlexPen Max daily 30 units 15 mL 11   insulin glargine (LANTUS SOLOSTAR) 100 UNIT/ML Solostar Pen Inject 10 Units into the skin daily. (Patient taking differently: Inject 8 Units into the skin daily.) 15 mL 6   Insulin Pen Needle 32G X 4 MM MISC 1 Device by Does not apply route in the morning, at noon, in the evening, and at bedtime. 400 each 3   lidocaine-prilocaine (EMLA) cream Apply 1 Application topically as needed. Apply 1 tsp over port site at least 45 minutes prior to lab appointment.Do not rub in cream. Cover with plastic wrap. 30 g 5   losartan  (COZAAR) 50 MG tablet TAKE ONE TABLET BY MOUTH EVERY DAY FOR HYPERTENSION     Multiple Vitamin (MULTIVITAMIN) tablet Take 1 tablet by mouth 2 (two) times a week.     valsartan-hydrochlorothiazide (DIOVAN-HCT) 80-12.5 MG tablet Take 1 tablet by mouth daily.     vitamin B-12 (CYANOCOBALAMIN) 500 MCG tablet Take 500 mcg by mouth daily.     No current facility-administered medications for this visit.    SURGICAL HISTORY:  Past Surgical History:  Procedure Laterality Date   CATARACT EXTRACTION W/PHACO Right 07/08/2018   Procedure: CATARACT EXTRACTION PHACO AND INTRAOCULAR LENS PLACEMENT ([place]) RIGHT;  Surgeon: Leandrew Koyanagi, MD;  Location: [facility];  Service: Ophthalmology;  Laterality: Right;   CATARACT EXTRACTION W/PHACO Left 07/29/2018   Procedure:  CATARACT EXTRACTION PHACO AND INTRAOCULAR LENS PLACEMENT ([place] [facility]) LEFT TORIC LENS;  Surgeon: Leandrew Koyanagi, MD;  Location: [facility];  Service: Ophthalmology;  Laterality: Left;   CHEST TUBE INSERTION Right 07/01/2019   Procedure: CHEST TUBE INSERTION;  Surgeon: [name], MD;  Location: [facility];  Service: Thoracic;  Laterality: Right;   COLONOSCOPY     EYE SURGERY     INTERCOSTAL NERVE BLOCK Right 06/17/2019   Procedure: INTERCOSTAL NERVE BLOCK;  Surgeon: [name], MD;  Location: [facility];  Service: Thoracic;  Laterality: Right;   INTERCOSTAL NERVE BLOCK Right 07/01/2019   Procedure: INTERCOSTAL NERVE BLOCK;  Surgeon: [name], MD;  Location: [facility];  Service: Thoracic;  Laterality: Right;   IR IMAGING GUIDED PORT INSERTION  02/21/2020   TONSILLECTOMY     VIDEO ASSISTED THORACOSCOPY Right 07/01/2019   Procedure: VIDEO ASSISTED THORACOSCOPY - REMOVAL OF RIGHT MIDDLE LOBE BLEBS;  Surgeon: [name], MD;  Location: [facility];  Service: Thoracic;  Laterality: Right;   VIDEO BRONCHOSCOPY WITH INSERTION OF INTERBRONCHIAL VALVE (IBV) Right 06/25/2019   Procedure: VIDEO BRONCHOSCOPY WITH  INSERTION OF INTERBRONCHIAL VALVE (IBV); Size 7 IBV into Right Loer Lobe (RLL) Superior Segment, Size 9 IBV into RLL Basilar Segment;  Surgeon: [name], MD;  Location: [facility];  Service: Thoracic;  Laterality: Right;   VIDEO BRONCHOSCOPY WITH INSERTION OF INTERBRONCHIAL VALVE (IBV) N/A 08/06/2019   Procedure: VIDEO BRONCHOSCOPY WITH REMOVAL OF INTERBRONCHIAL VALVE (IBV);  Surgeon: [name], MD;  [facility] OR;  Service: Thoracic;  Laterality: N/A;    REVIEW OF SYSTEMS:   Review of Systems  Constitutional: Positive for few pound weight loss.  Negative for appetite change, chills, fatigue, and fever.  HENT: Negative for mouth sores, nosebleeds, sore throat and trouble swallowing.   Eyes: Negative for eye problems and icterus.  Respiratory: As of her baseline dyspnea exertion.  Negative for cough, hemoptysis, and wheezing.   Cardiovascular: Negative for chest pain and leg swelling.  Gastrointestinal: Positive for baseline intermittent diarrhea, worse after most recent scan.  No diarrhea at this time.  Positive for intermittent suprapubic discomfort.  Negative for constipation, nausea and vomiting.  Genitourinary: Negative for bladder incontinence, difficulty urinating, dysuria, frequency and hematuria.   Musculoskeletal: Negative for back pain, gait problem, neck pain and neck stiffness.  Skin: Negative for itching and rash.  Neurological: Positive for baseline intermittent headaches.  Negative for dizziness, extremity weakness, gait problem, light-headedness and seizures.  Hematological: Negative for adenopathy. Does not bruise/bleed easily.  Psychiatric/Behavioral: Negative for confusion, depression and sleep disturbance. The patient is not nervous/anxious.     PHYSICAL EXAMINATION:  Blood pressure 124/81, pulse 78, temperature 97.8 F (36.6 C), temperature source Oral, resp. rate 15, weight 139 lb 6.4 oz (63.2 kg), SpO2 95 %.  ECOG PERFORMANCE STATUS: 1  Physical  Exam  Constitutional: Oriented to person, place, and time and well-developed, well-nourished, and in no distress.  HENT:  Head: Normocephalic and atraumatic.  Mouth/Throat: Oropharynx is clear and moist. No oropharyngeal exudate.  Eyes: Conjunctivae are normal. Right eye exhibits no discharge. Left eye exhibits no discharge. No scleral icterus.  Neck: Normal range of motion. Neck supple.  Cardiovascular: Normal rate, regular rhythm, normal heart sounds and intact distal pulses.   Pulmonary/Chest: Effort normal and breath sounds normal. No respiratory distress. No wheezes. No rales.  Abdominal: Soft. Bowel sounds are normal. Exhibits no distension and no mass. There is no tenderness.  Musculoskeletal: Normal range  of motion. Exhibits no edema.  Lymphadenopathy:    No cervical adenopathy.  Neurological: Alert and oriented to person, place, and time. Exhibits normal muscle tone. Gait normal. Coordination normal.  Skin: Positive for bruising on his upper extremities. Skin is warm and dry. No rash noted. Not diaphoretic. No erythema. No pallor.  Psychiatric: Mood, memory and judgment normal.  Vitals reviewed.  LABORATORY DATA: Lab Results  Component Value Date   WBC 6.7 08/29/2021   HGB 14.3 08/29/2021   HCT 40.4 08/29/2021   MCV 85.4 08/29/2021   PLT 199 08/29/2021      Chemistry      Component Value Date/Time   NA 133 (L) 08/29/2021 1034   K 4.8 08/29/2021 1034   CL 101 08/29/2021 1034   CO2 29 08/29/2021 1034   BUN 23 08/29/2021 1034   CREATININE 1.22 08/29/2021 1034      Component Value Date/Time   CALCIUM 9.2 08/29/2021 1034   ALKPHOS 43 08/29/2021 1034   AST 12 (L) 08/29/2021 1034   ALT 12 08/29/2021 1034   BILITOT 0.6 08/29/2021 1034       RADIOGRAPHIC STUDIES:  CT CHEST ABDOMEN PELVIS W CONTRAST  Result Date: 08/28/2021 CLINICAL DATA:  Primary Cancer Type: Lung Imaging Indication: Assess response to therapy Interval therapy since last imaging? Yes Initial Cancer  Diagnosis Date: 06/17/2019; Established by: Biopsy-proven Detailed Pathology: Metastatic non-small cell lung cancer initially diagnosed as stage IIB non-small cell lung cancer, invasive poorly differentiated carcinoma. Primary Tumor location:  Right upper lobe. Recurrence? Yes; Date(s) of recurrence: 11/29/2019; Established by: Biopsy-proven Surgeries: Right upper lobectomy 06/17/2019. VATS for stapling of bleb 07/01/2019. Chemotherapy: Yes; Ongoing? No; Most recent administration: 10/14/2019 Immunotherapy?  Yes; Type: Libtayo; Ongoing? Yes Radiation therapy? No * Tracking Code: BO * EXAM: CT CHEST, ABDOMEN, AND PELVIS WITH CONTRAST TECHNIQUE: Multidetector CT imaging of the chest, abdomen and pelvis was performed following the standard protocol during bolus administration of intravenous contrast. RADIATION DOSE REDUCTION: This exam was performed according to the departmental dose-optimization program which includes automated exposure control, adjustment of the mA and/or kV according to patient size and/or use of iterative reconstruction technique. CONTRAST:  179m OMNIPAQUE IOHEXOL 300 MG/ML SOLN additional oral enteric contrast COMPARISON:  Most recent chest, abdomen and pelvis 06/22/2021. 12/15/2019 PET-CT. FINDINGS: CT CHEST FINDINGS Cardiovascular: Right chest port catheter. Aortic atherosclerosis. Normal heart size. Left coronary artery calcifications. No pericardial effusion. Mediastinum/Nodes: No enlarged mediastinal, hilar, or axillary lymph nodes. Thyroid gland, trachea, and esophagus demonstrate no significant findings. Lungs/Pleura: Status post right upper lobectomy. Moderate centrilobular and paraseptal emphysema. No significant change in a subsolid nodule of the posterior left upper lobe abutting the major fissure measuring 2.1 x 1.1 cm, central nodular component measuring 0.5 cm (series 4, image 39). Bandlike scarring of the right lung base. No pleural effusion or pneumothorax. Musculoskeletal: No  chest wall abnormality. No acute osseous findings. CT ABDOMEN PELVIS FINDINGS Hepatobiliary: No solid liver abnormality is seen. Small poorly calcified gallstones with vacuum phenomenon (series 2, image 65). Gallbladder wall thickening, or biliary dilatation. Pancreas: Unremarkable. No pancreatic ductal dilatation or surrounding inflammatory changes. Spleen: Normal in size without significant abnormality. Adrenals/Urinary Tract: Adrenal glands are unremarkable. Simple, benign bilateral renal cortical cysts, for which no further follow-up or characterization is required. Kidneys are otherwise normal, without renal calculi, solid lesion, or hydronephrosis. Bladder is unremarkable. Stomach/Bowel: Stomach is within normal limits. Appendix is not clearly visualized and may be surgically absent. No evidence of bowel wall thickening, distention, or  inflammatory changes. Descending and sigmoid diverticulosis with mild fat stranding about the distal descending colon (series 2, image 96). Vascular/Lymphatic: Aortic atherosclerosis. No enlarged abdominal or pelvic lymph nodes. Reproductive: No mass or other abnormality. Other: No abdominal wall hernia or abnormality. No ascites. Musculoskeletal: No acute osseous findings. IMPRESSION: 1. Status post right upper lobectomy. 2. No significant change in a subsolid nodule of the posterior left upper lobe abutting the major fissure measuring 2.1 x 1.1 cm, central solid nodular component measuring 0.5 cm. This remains highly concerning for indolent adenocarcinoma. 3. Descending and sigmoid diverticulosis with mild fat stranding about the distal descending colon. Findings suggest acute diverticulitis. Correlate for acutely referable symptoms. No evidence of perforation or abscess. 4. Emphysema. 5. Cholelithiasis. 6. Coronary artery disease. Aortic Atherosclerosis (ICD10-I70.0) and Emphysema (ICD10-J43.9). Electronically Signed   By: Delanna Ahmadi M.D.   On: 08/28/2021 10:41      ASSESSMENT/PLAN:  This is a very pleasant 71 year old Caucasian male with metastatic lung cancer initially diagnosed as stage IIb (T1b, N1, M0) non-small cell lung cancer, adenocarcinoma.  He is status post a right upper lobe lobectomy with lymph node dissection under the care of Dr. Roxan Hockey.  This was performed on June 17, 2019.  He has no actionable mutations.  His PD-L1 expression is 90%. He had evidence for metastatic disease in November 2021 with small right pleural effusion as well as multiple right pleural implants.    The patient completed 3 of the 4 planned adjuvant chemotherapy cycles with cisplatin 75 mg per metered square and Alimta 500 mg/m. This was discontinued after cycle #3 due to intolerance.  When the patient was found to have evidence for disease recurrence, the patient was started on immunotherapy with Libtayo (Cempilimab) 350 mg IV every 3 weeks status post 27 cycles. He is tolerating this well.  The patient had a restaging CT scan a few months ago which showed a slowly enlarging subsolid nodule which we are monitoring closely.  Dr. Julien Nordmann mention that if this continues to enlarge we would consider SBRT.  The patient recently had a restaging CT scan to follow-up on this.  Dr. Julien Nordmann personally and independently reviewed the scan and discussed the results with the patient today.  The scan showed stable disease.  We will continue to monitor closely.  We can always refer to radiation oncology in the future if this continues to enlarge.  He will proceed with cycle # 28 today as scheduled.   We will see him back for follow-up visit in 3 weeks for evaluation and repeat blood work before starting cycle #29.  The patient monitors his blood sugar closely and takes his insulin as directed by endocrinology.  His lab work initially showed his blood sugar at 390.  Upon recheck, it is 320.  He will monitor this closely at home.  The patient was provided education on  diverticulitis on his AVS today.  Patient's restaging CT scan showed possible acute diverticulitis.  Current guidelines do not recommend any outpatient antibiotic therapy. The patient is not having any symptoms at this time, although he mentions he has noticed a mild self limiting super pubic discomfort over the last month or so.  The patient has baseline remittent diarrhea and has not noticed any changes at this time.  His last bowel movement was yesterday and was of normal color and consistency.  Encouraged the patient to have a liquid diet at this time.  Dr. Julien Nordmann discussed that immunotherapy can cause inflammatory changes in the  bowel and to monitor for changes in his condition closely.  Should he develop any new or worsening symptoms such as fevers, chills, changes in bowel habits, blood in the stool, increasing abdominal pain, etc. the patient was advised to call us immediately or seek emergency room evaluation.   The patient was advised to call immediately if he has any concerning symptoms in the interval. The patient voices understanding of current disease status and treatment options and is in agreement with the current care plan. All questions were answered. The patient knows to call the clinic with any problems, questions or concerns. We can certainly see the patient much sooner if necessary  No orders of the defined types were placed in this encounter.    Laria Grimmett L Yobani Schertzer, PA-C 08/29/21  ADDENDUM: Hematology/Oncology Attending: I had a face-to-face encounter with the patient today.  I reviewed his record, lab, scan and recommended his care plan.  This is a very pleasant 71 years old white male with recurrent non-small cell lung cancer, adenocarcinoma that was initially diagnosed as a stage IIb (T1b, N1, M0) status post right upper lobectomy with lymph node dissection under the care of Dr. Roxan Hockey on June 17, 2019.  The patient has no actionable mutations and PD-L1 expression  was 90%.  He is status post 4 cycles of adjuvant systemic chemotherapy with platinum based chemotherapy with pemetrexed. He has disease recurrence in the right side of the chest and the patient started treatment with immunotherapy with Libtayo (Cempilimab) 350 Mg IV every 3 weeks status post 27 cycles. The patient has been tolerating this treatment well except for the immunotherapy mediated type 1 diabetes mellitus and he is currently on insulin. He had repeat CT scan of the chest, abdomen and pelvis performed recently.  I personally and independently reviewed the scan and discussed the result with the patient today. His scan showed no concerning findings for disease progression but there was possible acute diverticulitis noted on the scan.  The patient is currently asymptomatic with no concerning abdominal pain in the left lower quadrant or diarrhea. I recommended for the patient to continue his current treatment with Libtayo (Cempilimab) with the same dose but he was advised to monitor for any concerning gastrointestinal abnormalities including fever or chills, diarrhea or abdominal pain to report immediately or go to the emergency department.  This could be an early sign of immunotherapy mediated colitis. The patient will come back for follow-up visit in 3 weeks for evaluation before the next cycle of his treatment. He was advised to call immediately if he has any other concerning symptoms in the interval. The total time spent in the appointment was 30 minutes. Disclaimer: This note was dictated with voice recognition software. Similar sounding words can inadvertently be transcribed and may be missed upon review. Eilleen Kempf, MD

## 2021-08-23 IMAGING — DX DG CHEST 1V PORT
1 series · 1 of 1 positions shown · non-contrast
Comparison: 06/19/2019 and older studies.

CLINICAL DATA: Follow-up pneumothorax.  Right chest tubes.

EXAM:
PORTABLE CHEST 1 VIEW

[chest]
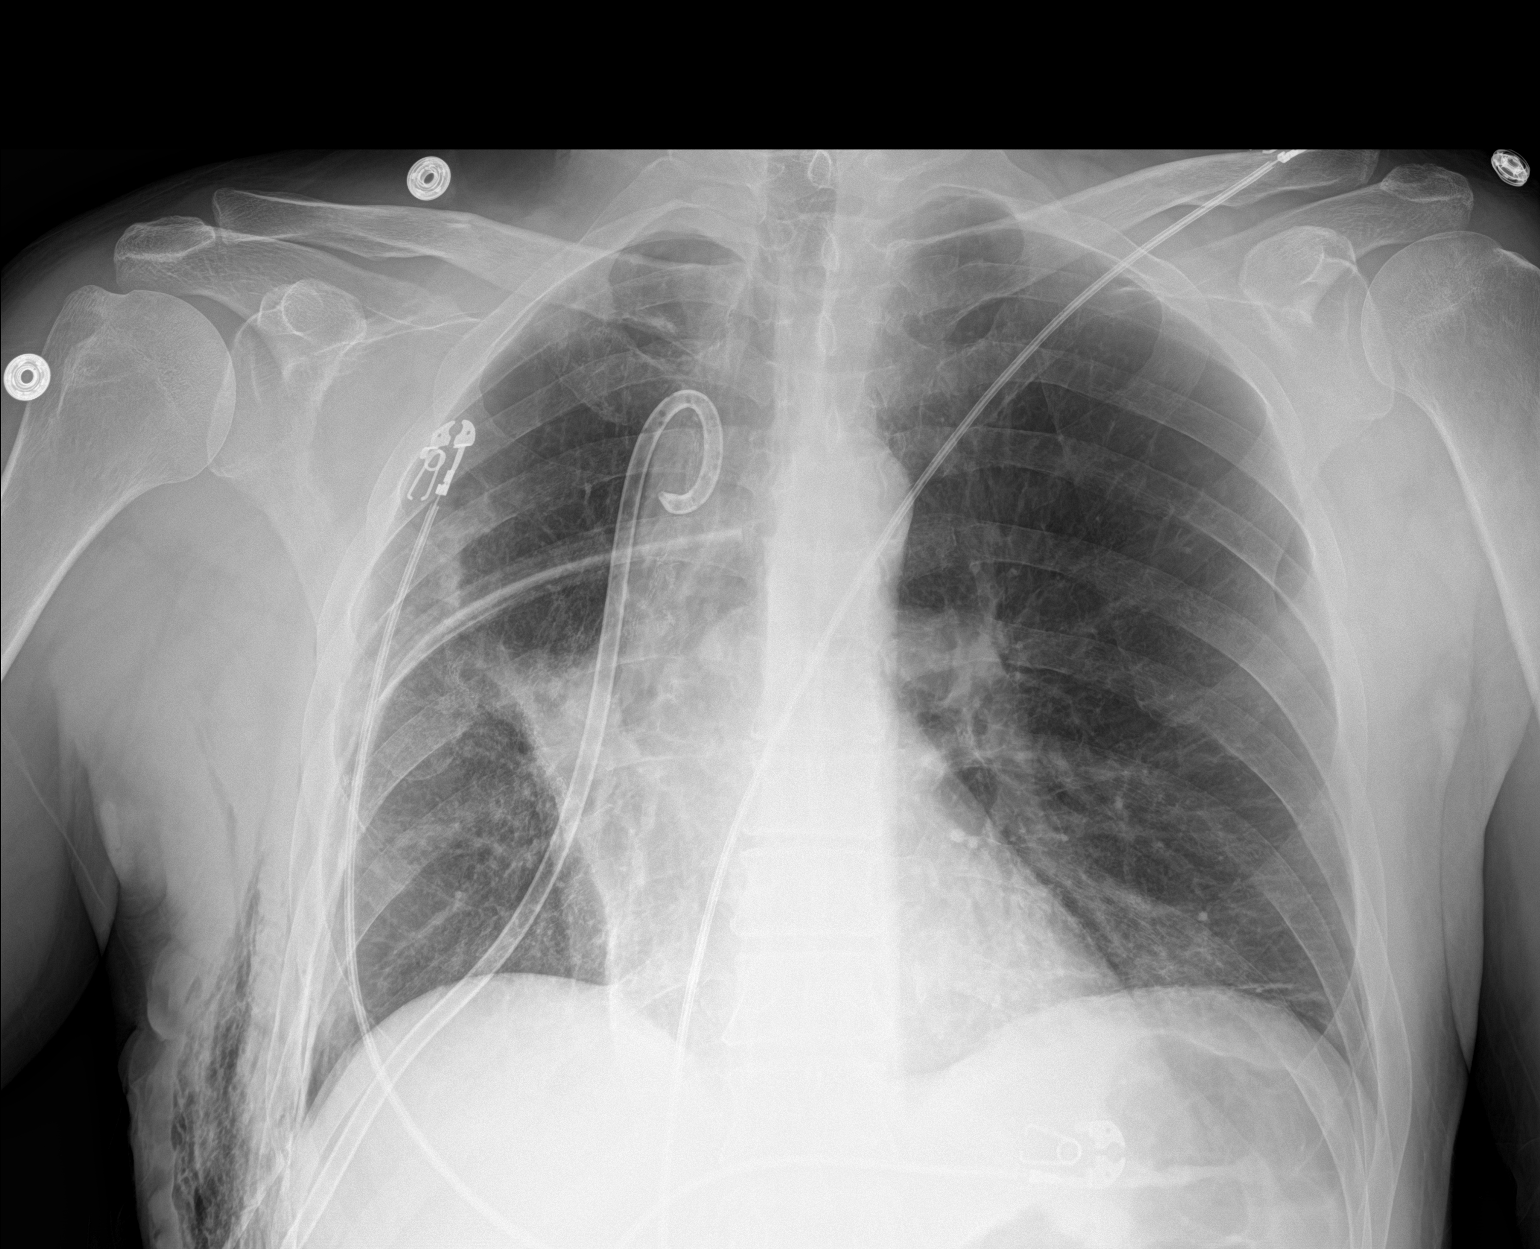

[1 of 1 positions shown; findings below may reference images not displayed]

FINDINGS: No convincing pneumothorax. Right-sided chest tubes are stable as
are postsurgical changes lateral and inferior to the right hilum.
Mild stable linear opacity at the left lung base consistent with
atelectasis. Remainder of the left lung is clear.

Right lateral chest wall subcutaneous emphysema is similar to the
previous day's exam.
IMPRESSION: 1. No significant change from the previous day's study.
2. Stable right chest tubes.  No pneumothorax.

## 2021-08-27 ENCOUNTER — Other Ambulatory Visit: Payer: Self-pay | Admitting: Internal Medicine

## 2021-08-27 ENCOUNTER — Ambulatory Visit
Admission: RE | Admit: 2021-08-27 | Discharge: 2021-08-27 | Disposition: A | Discharge status: Home / Self Care | Payer: Medicare Other | Source: Ambulatory Visit | Attending: Internal Medicine | Admitting: Internal Medicine

## 2021-08-27 DIAGNOSIS — I2510 Atherosclerotic heart disease of native coronary artery without angina pectoris: Secondary | ICD-10-CM | POA: Diagnosis not present

## 2021-08-27 DIAGNOSIS — C3490 Malignant neoplasm of unspecified part of unspecified bronchus or lung: Secondary | ICD-10-CM | POA: Diagnosis not present

## 2021-08-27 DIAGNOSIS — I700 Atherosclerosis of aorta: Secondary | ICD-10-CM | POA: Diagnosis not present

## 2021-08-27 DIAGNOSIS — K5730 Diverticulosis of large intestine without perforation or abscess without bleeding: Secondary | ICD-10-CM | POA: Diagnosis not present

## 2021-08-27 DIAGNOSIS — N281 Cyst of kidney, acquired: Secondary | ICD-10-CM | POA: Diagnosis not present

## 2021-08-27 DIAGNOSIS — J432 Centrilobular emphysema: Secondary | ICD-10-CM | POA: Diagnosis not present

## 2021-08-27 DIAGNOSIS — K8020 Calculus of gallbladder without cholecystitis without obstruction: Secondary | ICD-10-CM | POA: Diagnosis not present

## 2021-08-27 MED ORDER — HEPARIN SOD (PORK) LOCK FLUSH 100 UNIT/ML IV SOLN
INTRAVENOUS | Status: AC
  Filled 2021-08-27: qty 5

## 2021-08-27 MED ORDER — IOHEXOL 300 MG/ML  SOLN
100.0000 mL | Freq: Once | INTRAMUSCULAR | Status: AC | PRN
  Administered 2021-08-27: 100 mL via INTRAVENOUS

## 2021-08-27 NOTE — Progress Notes (Signed)
IVT consulted to access port.  Upon arrival, port was accessed.

## 2021-08-28 ENCOUNTER — Encounter: Payer: Self-pay | Admitting: Internal Medicine

## 2021-08-29 ENCOUNTER — Inpatient Hospital Stay: Payer: Medicare Other

## 2021-08-29 ENCOUNTER — Encounter: Payer: Self-pay | Admitting: Physician Assistant

## 2021-08-29 ENCOUNTER — Inpatient Hospital Stay: Payer: Medicare Other | Attending: Internal Medicine | Admitting: Physician Assistant

## 2021-08-29 ENCOUNTER — Other Ambulatory Visit: Payer: Self-pay

## 2021-08-29 ENCOUNTER — Other Ambulatory Visit: Payer: Self-pay | Admitting: Internal Medicine

## 2021-08-29 VITALS — BP 118/65 | HR 67 | Resp 17

## 2021-08-29 VITALS — BP 124/81 | HR 78 | Temp 97.8°F | Resp 15 | Wt 139.4 lb

## 2021-08-29 DIAGNOSIS — Z95828 Presence of other vascular implants and grafts: Secondary | ICD-10-CM

## 2021-08-29 DIAGNOSIS — Z5111 Encounter for antineoplastic chemotherapy: Secondary | ICD-10-CM

## 2021-08-29 DIAGNOSIS — G893 Neoplasm related pain (acute) (chronic): Secondary | ICD-10-CM | POA: Diagnosis not present

## 2021-08-29 DIAGNOSIS — Z902 Acquired absence of lung [part of]: Secondary | ICD-10-CM | POA: Insufficient documentation

## 2021-08-29 DIAGNOSIS — R5383 Other fatigue: Secondary | ICD-10-CM

## 2021-08-29 DIAGNOSIS — C3491 Malignant neoplasm of unspecified part of right bronchus or lung: Secondary | ICD-10-CM

## 2021-08-29 DIAGNOSIS — Z5112 Encounter for antineoplastic immunotherapy: Secondary | ICD-10-CM | POA: Insufficient documentation

## 2021-08-29 DIAGNOSIS — R739 Hyperglycemia, unspecified: Secondary | ICD-10-CM | POA: Diagnosis not present

## 2021-08-29 DIAGNOSIS — J449 Chronic obstructive pulmonary disease, unspecified: Secondary | ICD-10-CM | POA: Insufficient documentation

## 2021-08-29 DIAGNOSIS — E109 Type 1 diabetes mellitus without complications: Secondary | ICD-10-CM | POA: Insufficient documentation

## 2021-08-29 DIAGNOSIS — Z794 Long term (current) use of insulin: Secondary | ICD-10-CM | POA: Diagnosis not present

## 2021-08-29 DIAGNOSIS — C3411 Malignant neoplasm of upper lobe, right bronchus or lung: Secondary | ICD-10-CM | POA: Diagnosis not present

## 2021-08-29 LAB — CBC WITH DIFFERENTIAL (CANCER CENTER ONLY)
Abs Immature Granulocytes: 0.03 10*3/uL (ref 0.00–0.07)
Basophils Absolute: 0.1 10*3/uL (ref 0.0–0.1)
Basophils Relative: 1 %
Eosinophils Absolute: 0.3 10*3/uL (ref 0.0–0.5)
Eosinophils Relative: 4 %
HCT: 40.4 % (ref 39.0–52.0)
Hemoglobin: 14.3 g/dL (ref 13.0–17.0)
Immature Granulocytes: 0 %
Lymphocytes Relative: 15 %
Lymphs Abs: 1 10*3/uL (ref 0.7–4.0)
MCH: 30.2 pg (ref 26.0–34.0)
MCHC: 35.4 g/dL (ref 30.0–36.0)
MCV: 85.4 fL (ref 80.0–100.0)
Monocytes Absolute: 0.5 10*3/uL (ref 0.1–1.0)
Monocytes Relative: 7 %
Neutro Abs: 4.9 10*3/uL (ref 1.7–7.7)
Neutrophils Relative %: 73 %
Platelet Count: 199 10*3/uL (ref 150–400)
RBC: 4.73 MIL/uL (ref 4.22–5.81)
RDW: 14.3 % (ref 11.5–15.5)
WBC Count: 6.7 10*3/uL (ref 4.0–10.5)
nRBC: 0 % (ref 0.0–0.2)

## 2021-08-29 LAB — CMP (CANCER CENTER ONLY)
ALT: 12 U/L (ref 0–44)
AST: 12 U/L — ABNORMAL LOW (ref 15–41)
Albumin: 4.1 g/dL (ref 3.5–5.0)
Alkaline Phosphatase: 43 U/L (ref 38–126)
Anion gap: 3 — ABNORMAL LOW (ref 5–15)
BUN: 23 mg/dL (ref 8–23)
CO2: 29 mmol/L (ref 22–32)
Calcium: 9.2 mg/dL (ref 8.9–10.3)
Chloride: 101 mmol/L (ref 98–111)
Creatinine: 1.22 mg/dL (ref 0.61–1.24)
GFR, Estimated: >60 mL/min (ref >60)
Glucose, Bld: 390 mg/dL — ABNORMAL HIGH (ref 70–99)
Potassium: 4.8 mmol/L (ref 3.5–5.1)
Sodium: 133 mmol/L — ABNORMAL LOW (ref 135–145)
Total Bilirubin: 0.6 mg/dL (ref 0.3–1.2)
Total Protein: 6.4 g/dL — ABNORMAL LOW (ref 6.5–8.1)

## 2021-08-29 LAB — TSH: TSH: 1.55 u[IU]/mL (ref 0.350–4.500)

## 2021-08-29 MED ORDER — SODIUM CHLORIDE 0.9 % IV SOLN
350.0000 mg | Freq: Once | INTRAVENOUS | Status: AC
  Administered 2021-08-29: 350 mg via INTRAVENOUS
  Filled 2021-08-29: qty 7

## 2021-08-29 MED ORDER — LIDOCAINE-PRILOCAINE 2.5-2.5 % EX CREA: 1.0000 | TOPICAL_CREAM | CUTANEOUS | 5 refills | Status: DC | PRN

## 2021-08-29 MED ORDER — SODIUM CHLORIDE 0.9% FLUSH
10.0000 mL | INTRAVENOUS | Status: DC | PRN
  Administered 2021-08-29: 10 mL

## 2021-08-29 MED ORDER — SODIUM CHLORIDE 0.9 % IV SOLN: Freq: Once | INTRAVENOUS | Status: AC

## 2021-08-29 MED ORDER — HEPARIN SOD (PORK) LOCK FLUSH 100 UNIT/ML IV SOLN
500.0000 [IU] | Freq: Once | INTRAVENOUS | Status: AC | PRN
  Administered 2021-08-29: 500 [IU]

## 2021-08-29 NOTE — Patient Instructions (Signed)
[facility]  Discharge Instructions: Thank you for choosing [facility] to provide your oncology and hematology care.   If you have a lab appointment with the [facility], please go directly to the [facility] and check in at the registration area.   Wear comfortable clothing and clothing appropriate for easy access to any Portacath or PICC line.   We strive to give you quality time with your provider. You may need to reschedule your appointment if you arrive late (15 or more minutes).  Arriving late affects you and other patients whose appointments are after yours.  Also, if you miss three or more appointments without notifying the office, you may be dismissed from the clinic at the provider's discretion.      For prescription refill requests, have your pharmacy contact our office and allow 72 hours for refills to be completed.    Today you received the following chemotherapy and/or immunotherapy agents: Libtayo.      To help prevent nausea and vomiting after your treatment, we encourage you to take your nausea medication as directed.  BELOW ARE SYMPTOMS THAT SHOULD BE REPORTED IMMEDIATELY: *FEVER GREATER THAN 100.4 F (38 C) OR HIGHER *CHILLS OR SWEATING *NAUSEA AND VOMITING THAT IS NOT CONTROLLED WITH YOUR NAUSEA MEDICATION *UNUSUAL SHORTNESS OF BREATH *UNUSUAL BRUISING OR BLEEDING *URINARY PROBLEMS (pain or burning when urinating, or frequent urination) *BOWEL PROBLEMS (unusual diarrhea, constipation, pain near the anus) TENDERNESS IN MOUTH AND THROAT WITH OR WITHOUT PRESENCE OF ULCERS (sore throat, sores in mouth, or a toothache) UNUSUAL RASH, SWELLING OR PAIN  UNUSUAL VAGINAL DISCHARGE OR ITCHING   Items with * indicate a potential emergency and should be followed up as soon as possible or go to the Emergency Department if any problems should occur.  Please show the CHEMOTHERAPY ALERT CARD or IMMUNOTHERAPY ALERT CARD at check-in to  the Emergency Department and triage nurse.  Should you have questions after your visit or need to cancel or reschedule your appointment, please contact [facility]  Dept: [phone]  and follow the prompts.  Office hours are 8:00 a.m. to 4:30 p.m. Monday - Friday. Please note that voicemails left after 4:00 p.m. may not be returned until the following business day.  We are closed weekends and major holidays. You have access to a nurse at all times for urgent questions. Please call the main number to the clinic Dept: [phone] and follow the prompts.   For any non-urgent questions, you may also contact your provider using MyChart. We now offer e-Visits for anyone 52 and older to request care online for non-urgent symptoms. For details visit [email or web address].   Also download the MyChart app! Go to the app store, search "MyChart", open the app, select [facility], and log in with your MyChart username and password.  Masks are optional in the cancer centers. If you would like for your care team to wear a mask while they are taking care of you, please let them know. For doctor visits, patients may have with them one support person who is at least 70 years old. At this time, visitors are not allowed in the infusion area.

## 2021-09-06 IMAGING — DX DG CHEST 1V PORT
1 series · 1 of 1 positions shown · non-contrast
Comparison: July 03, 2019

CLINICAL DATA: Chest tube in place.

EXAM:
PORTABLE CHEST 1 VIEW

[chest ap]
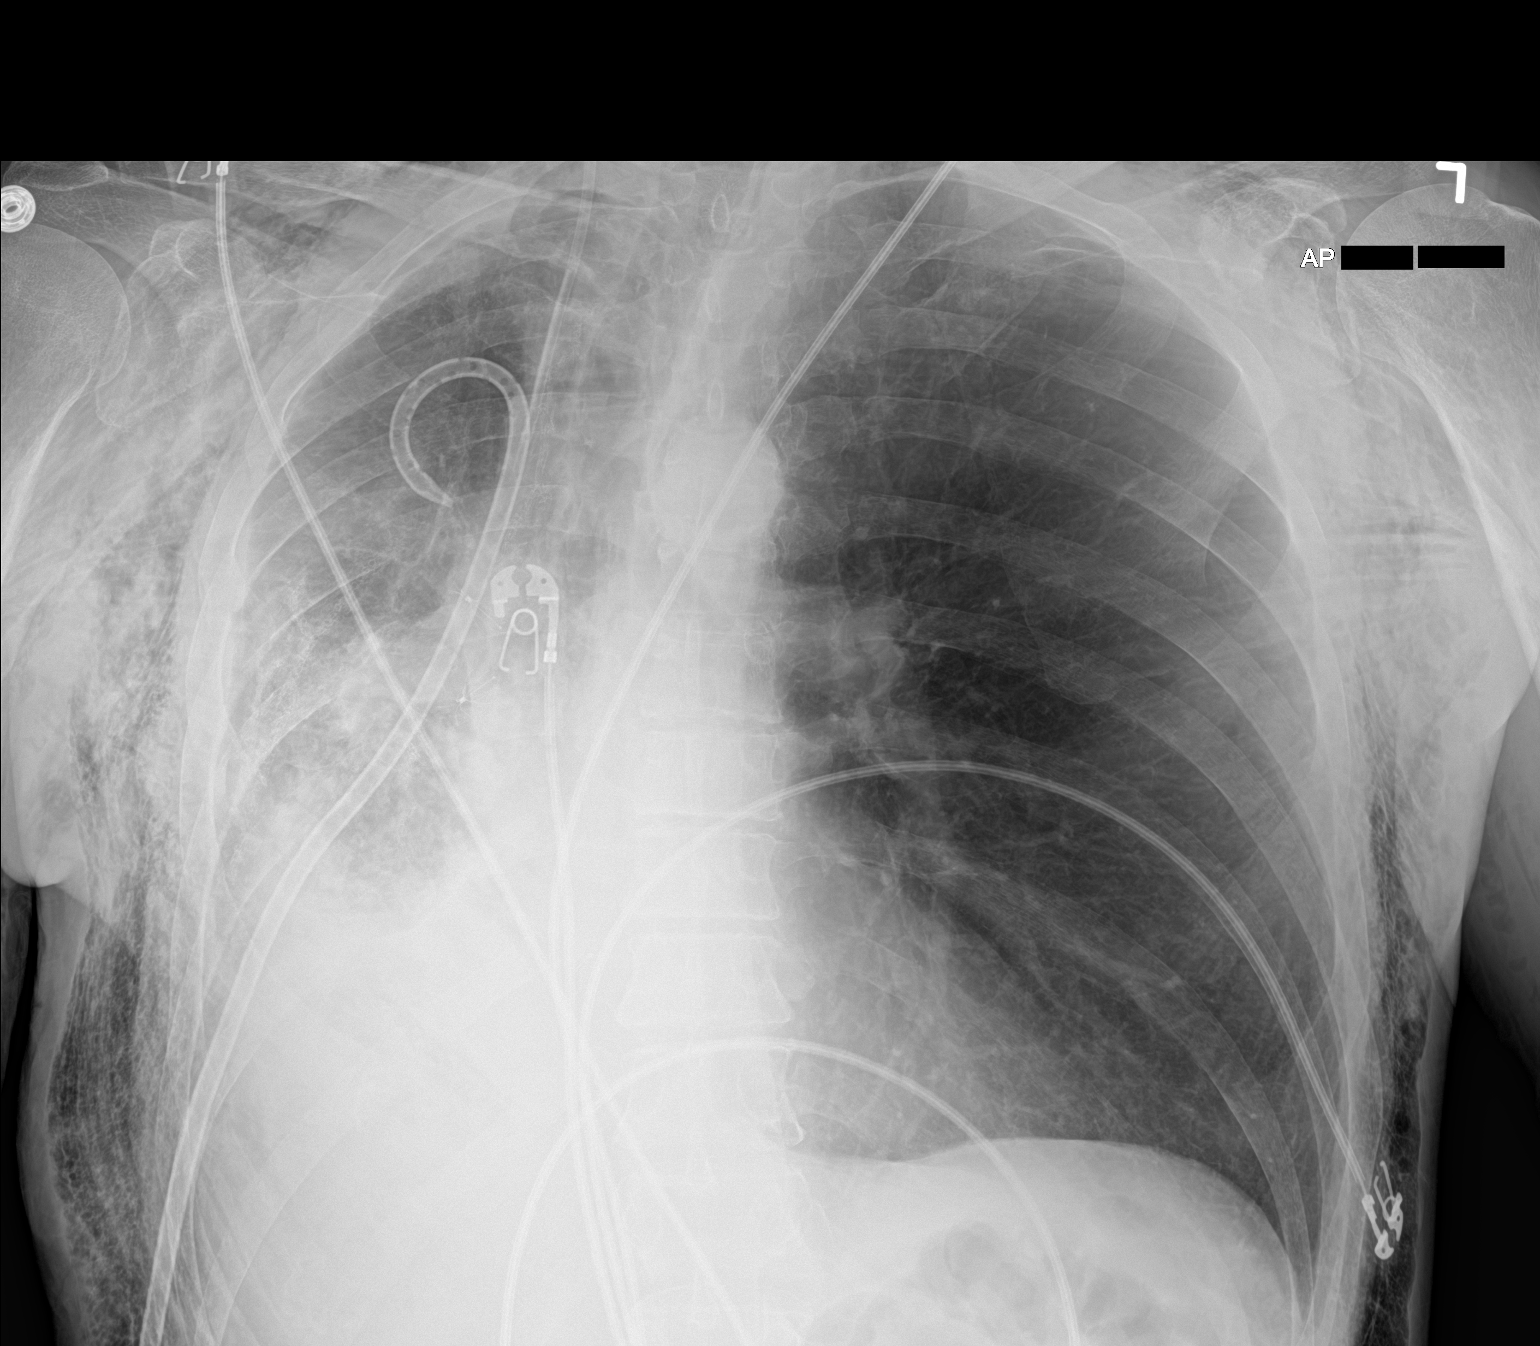

[1 of 1 positions shown; findings below may reference images not displayed]

FINDINGS: Volume loss on the right persists. A chest tube remains in similar
position. Stable right IJ. Right-sided opacity in the base is
unchanged. Subcutaneous air throughout the chest wall, right greater
than left, is stable. The cardiomediastinal silhouette is unchanged.
Postsurgical changes are seen on the right. Mild compensatory
enlarged hyperinflation of the left lung is again identified and
stable. No pneumothorax. The cardiomediastinal silhouette is stable.
IMPRESSION: 1. Postoperative changes persist on the right. A right chest tube
has been pulled back slightly but is in similar position. No
pneumothorax.
2. Persistent opacification over the right mid lower lung could be
due to atelectasis or infection and is stable. A small amount of
right pleural fluid is identified.
3. Persistent volume loss on the right.
4. Persistent compensatory hyperinflation of the left lung.
5. Stable subcutaneous emphysema over the chest and neck. Stable
right IJ.

## 2021-09-07 IMAGING — DX DG CHEST 1V
1 series · 1 of 1 positions shown · non-contrast
Comparison: 07/04/2019

CLINICAL DATA: Chest tube.  Pneumothorax.

EXAM:
CHEST  1 VIEW

[chest ap]
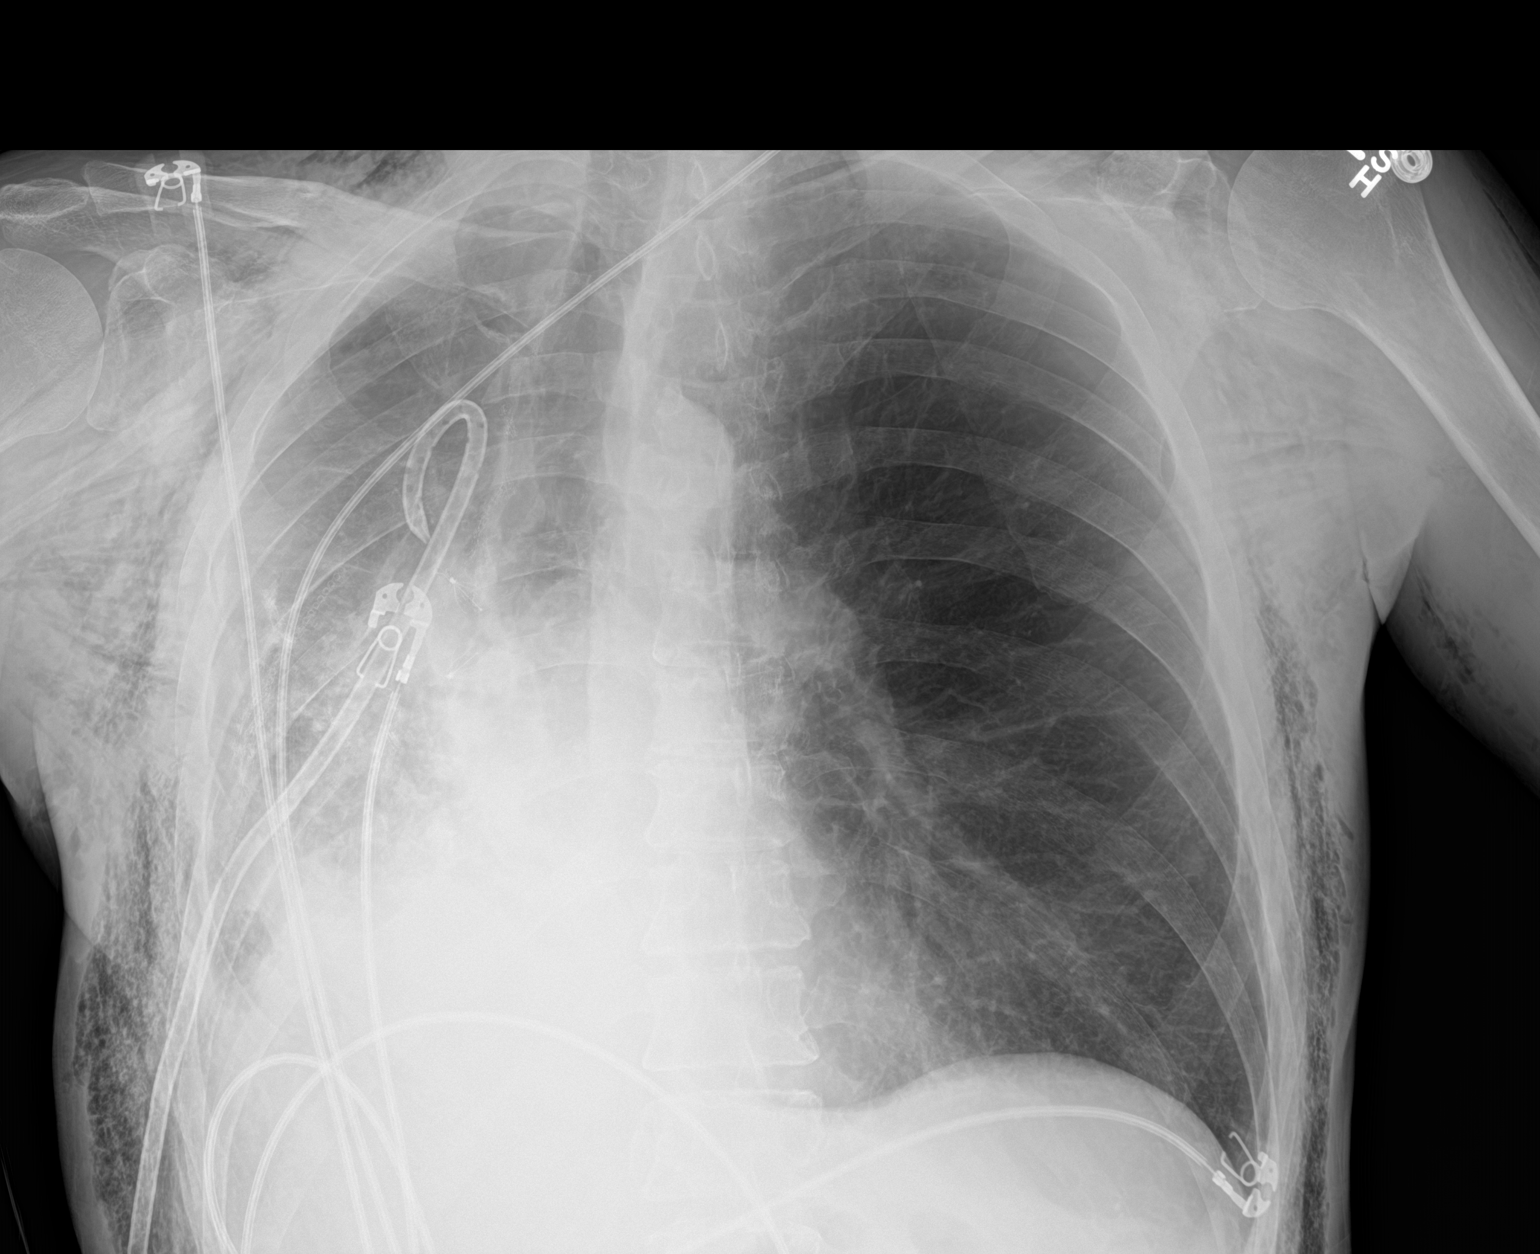

[1 of 1 positions shown; findings below may reference images not displayed]

FINDINGS: Pigtail chest tube on the right unchanged in position. Right lower
lobe consolidation and volume loss stable. No pneumothorax. Surgical
staples in the right lung. Endobronchial valve the right.

Extensive subcutaneous emphysema right greater than left is
unchanged. Left lung is hyperinflated and clear.
IMPRESSION: Right chest tube in place without pneumothorax. Right lower lobe
consolidation and volume loss unchanged.

## 2021-09-07 IMAGING — DX DG CHEST 1V PORT
1 series · 1 of 1 positions shown · non-contrast
Comparison: 07/05/2019 at [DATE] a.m. and 07/04/2019

CLINICAL DATA: Status post right chest tube removal. Previous right
upper lobectomy. Previous insertion of right interbronchial valve.

EXAM:
PORTABLE CHEST 1 VIEW [DATE] a.m.

[chest ap]
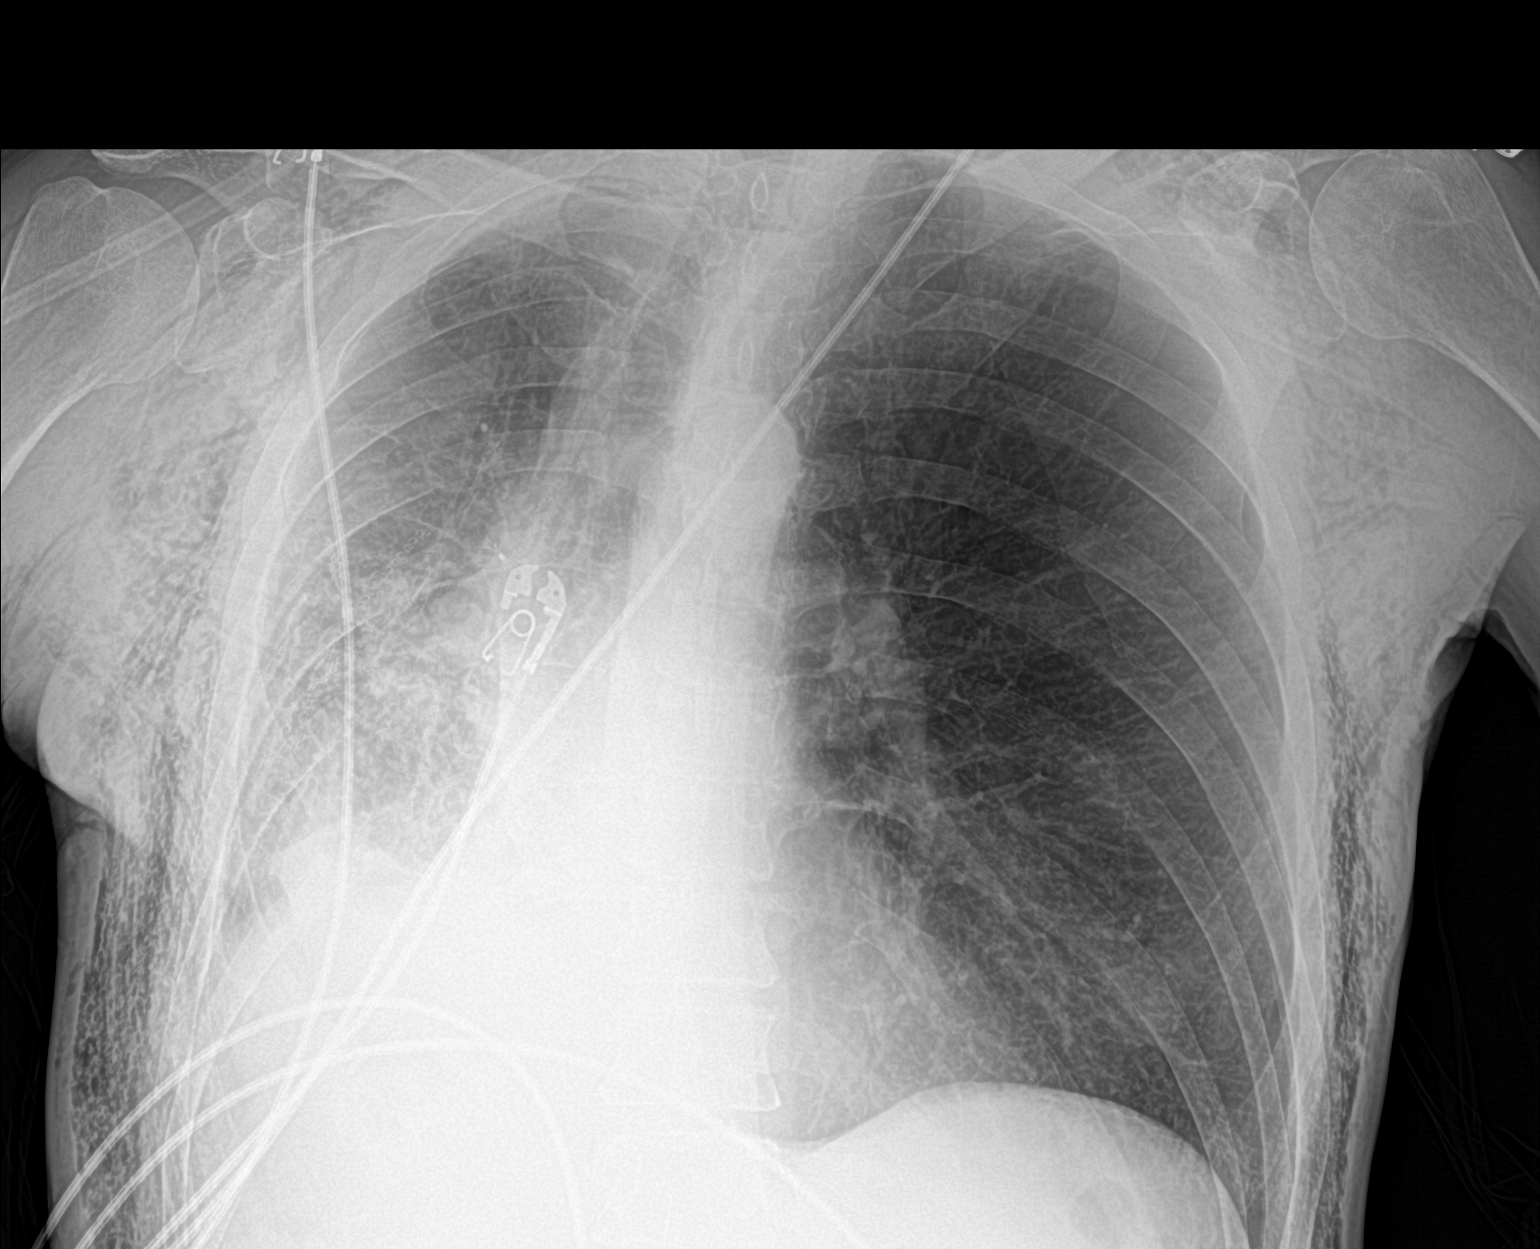

[1 of 1 positions shown; findings below may reference images not displayed]

FINDINGS: The right-sided chest tube has been removed. No pneumothorax.
Persistent atelectasis/infiltrate at the right lung base.

Left lung is clear.  Heart size and vascularity are normal.

Slightly less prominent subcutaneous emphysema in the chest.

No significant bone abnormality.
IMPRESSION: 1. No pneumothorax.
2. Persistent atelectasis/infiltrate at the right lung base.

## 2021-09-19 ENCOUNTER — Other Ambulatory Visit: Payer: Self-pay

## 2021-09-19 ENCOUNTER — Inpatient Hospital Stay: Payer: Medicare Other | Attending: Internal Medicine

## 2021-09-19 ENCOUNTER — Inpatient Hospital Stay: Payer: Medicare Other

## 2021-09-19 ENCOUNTER — Encounter: Payer: Self-pay | Admitting: Internal Medicine

## 2021-09-19 ENCOUNTER — Inpatient Hospital Stay (HOSPITAL_BASED_OUTPATIENT_CLINIC_OR_DEPARTMENT_OTHER): Payer: Medicare Other | Admitting: Internal Medicine

## 2021-09-19 VITALS — BP 121/66 | HR 63 | Resp 18

## 2021-09-19 DIAGNOSIS — C3491 Malignant neoplasm of unspecified part of right bronchus or lung: Secondary | ICD-10-CM

## 2021-09-19 DIAGNOSIS — Z79899 Other long term (current) drug therapy: Secondary | ICD-10-CM | POA: Insufficient documentation

## 2021-09-19 DIAGNOSIS — E109 Type 1 diabetes mellitus without complications: Secondary | ICD-10-CM | POA: Insufficient documentation

## 2021-09-19 DIAGNOSIS — C3411 Malignant neoplasm of upper lobe, right bronchus or lung: Secondary | ICD-10-CM | POA: Diagnosis not present

## 2021-09-19 DIAGNOSIS — Z902 Acquired absence of lung [part of]: Secondary | ICD-10-CM | POA: Diagnosis not present

## 2021-09-19 DIAGNOSIS — Z7951 Long term (current) use of inhaled steroids: Secondary | ICD-10-CM | POA: Diagnosis not present

## 2021-09-19 DIAGNOSIS — Z794 Long term (current) use of insulin: Secondary | ICD-10-CM | POA: Diagnosis not present

## 2021-09-19 DIAGNOSIS — Z5111 Encounter for antineoplastic chemotherapy: Secondary | ICD-10-CM

## 2021-09-19 DIAGNOSIS — Z5112 Encounter for antineoplastic immunotherapy: Secondary | ICD-10-CM | POA: Insufficient documentation

## 2021-09-19 LAB — CMP (CANCER CENTER ONLY)
ALT: 12 U/L (ref 0–44)
AST: 12 U/L — ABNORMAL LOW (ref 15–41)
Albumin: 4.4 g/dL (ref 3.5–5.0)
Alkaline Phosphatase: 39 U/L (ref 38–126)
Anion gap: 5 (ref 5–15)
BUN: 21 mg/dL (ref 8–23)
CO2: 29 mmol/L (ref 22–32)
Calcium: 9.3 mg/dL (ref 8.9–10.3)
Chloride: 100 mmol/L (ref 98–111)
Creatinine: 1.12 mg/dL (ref 0.61–1.24)
GFR, Estimated: >60 mL/min (ref >60)
Glucose, Bld: 275 mg/dL — ABNORMAL HIGH (ref 70–99)
Potassium: 4.6 mmol/L (ref 3.5–5.1)
Sodium: 134 mmol/L — ABNORMAL LOW (ref 135–145)
Total Bilirubin: 0.7 mg/dL (ref 0.3–1.2)
Total Protein: 6.6 g/dL (ref 6.5–8.1)

## 2021-09-19 LAB — CBC WITH DIFFERENTIAL (CANCER CENTER ONLY)
Abs Immature Granulocytes: 0.02 10*3/uL (ref 0.00–0.07)
Basophils Absolute: 0.1 10*3/uL (ref 0.0–0.1)
Basophils Relative: 1 %
Eosinophils Absolute: 0.3 10*3/uL (ref 0.0–0.5)
Eosinophils Relative: 4 %
HCT: 42 % (ref 39.0–52.0)
Hemoglobin: 14.9 g/dL (ref 13.0–17.0)
Immature Granulocytes: 0 %
Lymphocytes Relative: 17 %
Lymphs Abs: 1.3 10*3/uL (ref 0.7–4.0)
MCH: 30.7 pg (ref 26.0–34.0)
MCHC: 35.5 g/dL (ref 30.0–36.0)
MCV: 86.6 fL (ref 80.0–100.0)
Monocytes Absolute: 0.5 10*3/uL (ref 0.1–1.0)
Monocytes Relative: 7 %
Neutro Abs: 5.4 10*3/uL (ref 1.7–7.7)
Neutrophils Relative %: 71 %
Platelet Count: 168 10*3/uL (ref 150–400)
RBC: 4.85 MIL/uL (ref 4.22–5.81)
RDW: 13.8 % (ref 11.5–15.5)
WBC Count: 7.6 10*3/uL (ref 4.0–10.5)
nRBC: 0 % (ref 0.0–0.2)

## 2021-09-19 LAB — TSH: TSH: 1.302 u[IU]/mL (ref 0.350–4.500)

## 2021-09-19 MED ORDER — SODIUM CHLORIDE 0.9 % IV SOLN: Freq: Once | INTRAVENOUS | Status: AC

## 2021-09-19 MED ORDER — SODIUM CHLORIDE 0.9% FLUSH
10.0000 mL | INTRAVENOUS | Status: DC | PRN
  Administered 2021-09-19: 10 mL

## 2021-09-19 MED ORDER — HEPARIN SOD (PORK) LOCK FLUSH 100 UNIT/ML IV SOLN
500.0000 [IU] | Freq: Once | INTRAVENOUS | Status: AC | PRN
  Administered 2021-09-19: 500 [IU]

## 2021-09-19 MED ORDER — SODIUM CHLORIDE 0.9 % IV SOLN
350.0000 mg | Freq: Once | INTRAVENOUS | Status: AC
  Administered 2021-09-19: 350 mg via INTRAVENOUS
  Filled 2021-09-19: qty 7

## 2021-09-19 NOTE — Patient Instructions (Signed)
[facility]  Discharge Instructions: Thank you for choosing [facility] to provide your oncology and hematology care.   If you have a lab appointment with the [facility], please go directly to the [facility] and check in at the registration area.   Wear comfortable clothing and clothing appropriate for easy access to any Portacath or PICC line.   We strive to give you quality time with your provider. You may need to reschedule your appointment if you arrive late (15 or more minutes).  Arriving late affects you and other patients whose appointments are after yours.  Also, if you miss three or more appointments without notifying the office, you may be dismissed from the clinic at the provider's discretion.      For prescription refill requests, have your pharmacy contact our office and allow 72 hours for refills to be completed.    Today you received the following chemotherapy and/or immunotherapy agents: Cemiplimab.       To help prevent nausea and vomiting after your treatment, we encourage you to take your nausea medication as directed.  BELOW ARE SYMPTOMS THAT SHOULD BE REPORTED IMMEDIATELY: *FEVER GREATER THAN 100.4 F (38 C) OR HIGHER *CHILLS OR SWEATING *NAUSEA AND VOMITING THAT IS NOT CONTROLLED WITH YOUR NAUSEA MEDICATION *UNUSUAL SHORTNESS OF BREATH *UNUSUAL BRUISING OR BLEEDING *URINARY PROBLEMS (pain or burning when urinating, or frequent urination) *BOWEL PROBLEMS (unusual diarrhea, constipation, pain near the anus) TENDERNESS IN MOUTH AND THROAT WITH OR WITHOUT PRESENCE OF ULCERS (sore throat, sores in mouth, or a toothache) UNUSUAL RASH, SWELLING OR PAIN  UNUSUAL VAGINAL DISCHARGE OR ITCHING   Items with * indicate a potential emergency and should be followed up as soon as possible or go to the Emergency Department if any problems should occur.  Please show the CHEMOTHERAPY ALERT CARD or IMMUNOTHERAPY ALERT CARD at check-in  to the Emergency Department and triage nurse.  Should you have questions after your visit or need to cancel or reschedule your appointment, please contact [facility]  Dept: [phone]  and follow the prompts.  Office hours are 8:00 a.m. to 4:30 p.m. Monday - Friday. Please note that voicemails left after 4:00 p.m. may not be returned until the following business day.  We are closed weekends and major holidays. You have access to a nurse at all times for urgent questions. Please call the main number to the clinic Dept: [phone] and follow the prompts.   For any non-urgent questions, you may also contact your provider using MyChart. We now offer e-Visits for anyone 66 and older to request care online for non-urgent symptoms. For details visit [email or web address].   Also download the MyChart app! Go to the app store, search "MyChart", open the app, select [facility], and log in with your MyChart username and password.  Masks are optional in the cancer centers. If you would like for your care team to wear a mask while they are taking care of you, please let them know. You may have one support person who is at least 70 years old accompany you for your appointments.

## 2021-09-19 NOTE — Progress Notes (Signed)
[facility] [phone]   Fax:([phone]) [place], [facility] [address] [place] [address]  DIAGNOSIS:  1) Metastatic non-small cell lung cancer initially diagnosed as stage IIB (T1b, N1, M0) non-small cell lung cancer, invasive poorly differentiated carcinoma diagnosed in June 2021 with disease recurrence in November 2021 Molecular studies are negative for EGFR mutation as well as ALK gene translocation.  PD-L1 expression was 50-100%.  This is based on the report from the [employer] trial. Molecular studies by [employer]: Showed no actionable mutations and PD-L1 expression was 90% 2) immunotherapy induced type 1 diabetes mellitus.  PRIOR THERAPY:  1) Status post right upper lobectomy with lymph node dissection on June 17, 2019 under the care of Dr. Roxan Hockey. 2) Adjuvant systemic chemotherapy with cisplatin 75 mg/M2 and Alimta 500 mg/M2 every 3 weeks.  First dose September 02, 2019. Status post 3 cycles.  His treatment was discontinued secondary to intolerance.  CURRENT THERAPY: First-line treatment with immunotherapy with Libtayo (Cempilimab) 350 mg IV every 3 weeks status post 28 cycles.  INTERVAL HISTORY: Luis [name] 71 y.o. male returns to the clinic today for follow-up visit.  The patient is feeling fine today with no concerning complaints.  He has been tolerating his treatment with Libtayo (Cempilimab) fairly well.  He denied having any current chest pain, shortness of breath, cough or hemoptysis.  He has no nausea, vomiting, diarrhea or constipation.  He has no headache or visual changes.  He has no recent weight loss or night sweats.  He is here today for evaluation before starting cycle #29.   MEDICAL HISTORY: Past Medical History:  Diagnosis Date   Arthritis    through out body   Chronic bronchitis (HCC)    Concussion    COPD (chronic obstructive pulmonary disease) (HCC)    Diabetes mellitus without  complication (HCC)    Dyspnea    Emphysema lung (HCC)    Fatigue    Frozen shoulder    LEFT   GERD (gastroesophageal reflux disease)    Hard of hearing    Headache    alot of days, related to spine issues   Hypertension    Lumbar radiculopathy 2013   nscl ca 04/2019   Peyronie's disease    Prostatitis 2011   Dr. Gaynelle Arabian   RLS (restless legs syndrome)    Shingles 06/13/2018   shingles on back, finished with prednisone, stills has scabs and pain   Thigh pain    Bilateral Thigh   Thigh pain 10/2011   bilateral   Tobacco abuse 2010   still uses electronic cig/ emphysema, SOB easily   Vertigo    per wifes update   Wears partial dentures    upper    ALLERGIES:  is allergic to ativan [lorazepam], doxycycline, and tramadol.  MEDICATIONS:  Current Outpatient Medications  Medication Sig Dispense Refill   acetaminophen (TYLENOL) 500 MG tablet Take 1,000 mg by mouth at bedtime as needed for moderate pain.     albuterol (VENTOLIN HFA) 108 (90 Base) MCG/ACT inhaler INHALE 1-2 PUFFS BY ORAL INHALATION EVERY 4 HOURS FOR CHRONIC OBSTRUCTIVE LUNG DISEASE BE SURE TO WASH MOUTHPIECE WITH WARM WATER ONCE A WEEK     amLODipine (NORVASC) 5 MG tablet TAKE ONE-HALF TABLET BY MOUTH EVERY DAY FOR BLOOD PRESSURE. NOTE NEW MEDICINE     b complex vitamins capsule Take 1 capsule by mouth daily.     Cholecalciferol (VITAMIN  D) 125 MCG (5000 UT) CAPS Take 5,000 Units by mouth daily.     citalopram (CELEXA) 20 MG tablet Take 1 tablet by mouth daily.     Continuous Blood Gluc Receiver (Boaz) DEVI by Does not apply route.     Continuous Blood Gluc Sensor (DEXCOM G7 SENSOR) MISC by Does not apply route.     fluticasone-salmeterol (ADVAIR) 250-50 MCG/ACT AEPB 1 puff     glucose blood (ACCU-CHEK GUIDE) test strip See admin instructions.     insulin aspart (NOVOLOG FLEXPEN) 100 UNIT/ML FlexPen Max daily 30 units 15 mL 11   insulin glargine (LANTUS SOLOSTAR) 100 UNIT/ML Solostar Pen Inject 10  Units into the skin daily. (Patient taking differently: Inject 8 Units into the skin daily.) 15 mL 6   Insulin Pen Needle 32G X 4 MM MISC 1 Device by Does not apply route in the morning, at noon, in the evening, and at bedtime. 400 each 3   lidocaine-prilocaine (EMLA) cream Apply 1 Application topically as needed. Apply 1 tsp over port site at least 45 minutes prior to lab appointment.Do not rub in cream. Cover with plastic wrap. 30 g 5   losartan (COZAAR) 50 MG tablet TAKE ONE TABLET BY MOUTH EVERY DAY FOR HYPERTENSION     Multiple Vitamin (MULTIVITAMIN) tablet Take 1 tablet by mouth 2 (two) times a week.     vitamin B-12 (CYANOCOBALAMIN) 500 MCG tablet Take 500 mcg by mouth daily.     No current facility-administered medications for this visit.    SURGICAL HISTORY:  Past Surgical History:  Procedure Laterality Date   CATARACT EXTRACTION W/PHACO Right 07/08/2018   Procedure: CATARACT EXTRACTION PHACO AND INTRAOCULAR LENS PLACEMENT ([facility]) RIGHT;  Surgeon: Leandrew Koyanagi, MD;  Location: [facility];  Service: Ophthalmology;  Laterality: Right;   CATARACT EXTRACTION W/PHACO Left 07/29/2018   Procedure: CATARACT EXTRACTION PHACO AND INTRAOCULAR LENS PLACEMENT ([facility]) LEFT TORIC LENS;  Surgeon: Leandrew Koyanagi, MD;  Location: [facility];  Service: Ophthalmology;  Laterality: Left;   CHEST TUBE INSERTION Right 07/01/2019   Procedure: CHEST TUBE INSERTION;  Surgeon: [name], MD;  Location: [facility];  Service: Thoracic;  Laterality: Right;   COLONOSCOPY     EYE SURGERY     INTERCOSTAL NERVE BLOCK Right 06/17/2019   Procedure: INTERCOSTAL NERVE BLOCK;  Surgeon: [name], MD;  Location: [facility];  Service: Thoracic;  Laterality: Right;   INTERCOSTAL NERVE BLOCK Right 07/01/2019   Procedure: INTERCOSTAL NERVE BLOCK;  Surgeon: [name], MD;  Location: [facility];  Service: Thoracic;  Laterality: Right;   IR IMAGING GUIDED PORT INSERTION  02/21/2020    TONSILLECTOMY     VIDEO ASSISTED THORACOSCOPY Right 07/01/2019   Procedure: VIDEO ASSISTED THORACOSCOPY - REMOVAL OF RIGHT MIDDLE LOBE BLEBS;  Surgeon: [name], MD;  Location: [facility];  Service: Thoracic;  Laterality: Right;   VIDEO BRONCHOSCOPY WITH INSERTION OF INTERBRONCHIAL VALVE (IBV) Right 06/25/2019   Procedure: VIDEO BRONCHOSCOPY WITH INSERTION OF INTERBRONCHIAL VALVE (IBV); Size 7 IBV into Right Loer Lobe (RLL) Superior Segment, Size 9 IBV into RLL Basilar Segment;  Surgeon: [name], MD;  Location: [facility];  Service: Thoracic;  Laterality: Right;   VIDEO BRONCHOSCOPY WITH INSERTION OF INTERBRONCHIAL VALVE (IBV) N/A 08/06/2019   Procedure: VIDEO BRONCHOSCOPY WITH REMOVAL OF INTERBRONCHIAL VALVE (IBV);  Surgeon: [name], MD;  Location: [facility] OR;  Service: Thoracic;  Laterality: N/A;    REVIEW OF SYSTEMS:  A comprehensive  review of systems was negative.   PHYSICAL EXAMINATION: General appearance: alert, cooperative, and no distress Head: Normocephalic, without obvious abnormality, atraumatic Neck: no adenopathy, no JVD, supple, symmetrical, trachea midline, and thyroid not enlarged, symmetric, no tenderness/mass/nodules Lymph nodes: Cervical, supraclavicular, and axillary nodes normal. Resp: clear to auscultation bilaterally Back: symmetric, no curvature. ROM normal. No CVA tenderness. Cardio: regular rate and rhythm, S1, S2 normal, no murmur, click, rub or gallop GI: soft, non-tender; bowel sounds normal; no masses,  no organomegaly Extremities: extremities normal, atraumatic, no cyanosis or edema  ECOG PERFORMANCE STATUS: 1 - Symptomatic but completely ambulatory  Blood pressure 133/80, pulse 67, temperature 97.9 F (36.6 C), temperature source Oral, resp. rate 15, weight 138 lb 12.8 oz (63 kg), SpO2 97 %.  LABORATORY DATA: Lab Results  Component Value Date   WBC 7.6 09/19/2021   HGB 14.9 09/19/2021   HCT 42.0 09/19/2021   MCV 86.6 09/19/2021    PLT 168 09/19/2021      Chemistry      Component Value Date/Time   NA 134 (L) 09/19/2021 0956   K 4.6 09/19/2021 0956   CL 100 09/19/2021 0956   CO2 29 09/19/2021 0956   BUN 21 09/19/2021 0956   CREATININE 1.12 09/19/2021 0956      Component Value Date/Time   CALCIUM 9.3 09/19/2021 0956   ALKPHOS 39 09/19/2021 0956   AST 12 (L) 09/19/2021 0956   ALT 12 09/19/2021 0956   BILITOT 0.7 09/19/2021 0956       RADIOGRAPHIC STUDIES: CT CHEST ABDOMEN PELVIS W CONTRAST  Result Date: 08/28/2021 CLINICAL DATA:  Primary Cancer Type: Lung Imaging Indication: Assess response to therapy Interval therapy since last imaging? Yes Initial Cancer Diagnosis Date: 06/17/2019; Established by: Biopsy-proven Detailed Pathology: Metastatic non-small cell lung cancer initially diagnosed as stage IIB non-small cell lung cancer, invasive poorly differentiated carcinoma. Primary Tumor location:  Right upper lobe. Recurrence? Yes; Date(s) of recurrence: 11/29/2019; Established by: Biopsy-proven Surgeries: Right upper lobectomy 06/17/2019. VATS for stapling of bleb 07/01/2019. Chemotherapy: Yes; Ongoing? No; Most recent administration: 10/14/2019 Immunotherapy?  Yes; Type: Libtayo; Ongoing? Yes Radiation therapy? No * Tracking Code: BO * EXAM: CT CHEST, ABDOMEN, AND PELVIS WITH CONTRAST TECHNIQUE: Multidetector CT imaging of the chest, abdomen and pelvis was performed following the standard protocol during bolus administration of intravenous contrast. RADIATION DOSE REDUCTION: This exam was performed according to the departmental dose-optimization program which includes automated exposure control, adjustment of the mA and/or kV according to patient size and/or use of iterative reconstruction technique. CONTRAST:  114m OMNIPAQUE IOHEXOL 300 MG/ML SOLN additional oral enteric contrast COMPARISON:  Most recent chest, abdomen and pelvis 06/22/2021. 12/15/2019 PET-CT. FINDINGS: CT CHEST FINDINGS Cardiovascular: Right chest  port catheter. Aortic atherosclerosis. Normal heart size. Left coronary artery calcifications. No pericardial effusion. Mediastinum/Nodes: No enlarged mediastinal, hilar, or axillary lymph nodes. Thyroid gland, trachea, and esophagus demonstrate no significant findings. Lungs/Pleura: Status post right upper lobectomy. Moderate centrilobular and paraseptal emphysema. No significant change in a subsolid nodule of the posterior left upper lobe abutting the major fissure measuring 2.1 x 1.1 cm, central nodular component measuring 0.5 cm (series 4, image 39). Bandlike scarring of the right lung base. No pleural effusion or pneumothorax. Musculoskeletal: No chest wall abnormality. No acute osseous findings. CT ABDOMEN PELVIS FINDINGS Hepatobiliary: No solid liver abnormality is seen. Small poorly calcified gallstones with vacuum phenomenon (series 2, image 65). Gallbladder wall thickening, or biliary dilatation. Pancreas: Unremarkable. No pancreatic ductal dilatation or surrounding inflammatory changes. Spleen:  Normal in size without significant abnormality. Adrenals/Urinary Tract: Adrenal glands are unremarkable. Simple, benign bilateral renal cortical cysts, for which no further follow-up or characterization is required. Kidneys are otherwise normal, without renal calculi, solid lesion, or hydronephrosis. Bladder is unremarkable. Stomach/Bowel: Stomach is within normal limits. Appendix is not clearly visualized and may be surgically absent. No evidence of bowel wall thickening, distention, or inflammatory changes. Descending and sigmoid diverticulosis with mild fat stranding about the distal descending colon (series 2, image 96). Vascular/Lymphatic: Aortic atherosclerosis. No enlarged abdominal or pelvic lymph nodes. Reproductive: No mass or other abnormality. Other: No abdominal wall hernia or abnormality. No ascites. Musculoskeletal: No acute osseous findings. IMPRESSION: 1. Status post right upper lobectomy. 2. No  significant change in a subsolid nodule of the posterior left upper lobe abutting the major fissure measuring 2.1 x 1.1 cm, central solid nodular component measuring 0.5 cm. This remains highly concerning for indolent adenocarcinoma. 3. Descending and sigmoid diverticulosis with mild fat stranding about the distal descending colon. Findings suggest acute diverticulitis. Correlate for acutely referable symptoms. No evidence of perforation or abscess. 4. Emphysema. 5. Cholelithiasis. 6. Coronary artery disease. Aortic Atherosclerosis (ICD10-I70.0) and Emphysema (ICD10-J43.9). Electronically Signed   By: Delanna Ahmadi M.D.   On: 08/28/2021 10:41    ASSESSMENT AND PLAN: This is a very pleasant 71 years old white male recently diagnosed with stage IIb (T1b, N1, M0) non-small cell lung cancer, adenocarcinoma status post right upper lobectomy with lymph node dissection under the care of Dr. Roxan Hockey on June 17, 2019. The patient had molecular studies on the [employer]chem[employer] clinical trial that showed negative for EGFR mutation as well as ALK gene translocation but PD-L1 expression in the range of 50-100%. The patient underwent standard adjuvant systemic chemotherapy with cisplatin 75 mg/M2 and Alimta 500 mg/M2 every 3 weeks status post 3 cycles.  His treatment was discontinued secondary to intolerance. Unfortunately repeat CT scan as well as PET scan after completion of the adjuvant chemotherapy showed concerning findings for disease recurrence with small right pleural effusion as well as multiple pleural implants consistent with metastatic disease which was confirmed with repeat biopsy. He had molecular studies by [employer] and the blood test showed no actionable mutation.  His PD-L1 expression was 90%. The patient is currently undergoing treatment with immunotherapy with Libtayo (Cempilimab) 350 mg IV every 3 weeks status post 28 cycles. He was diagnosed with immunotherapy mediated type 1 diabetes mellitus and  currently on treatment with insulin. The patient has been tolerating this treatment well with no concerning adverse effects. I recommended for him to proceed with cycle #29 today as planned. I will see him back for follow-up visit in 3 weeks for evaluation before the next cycle of his treatment. The patient was advised to call immediately if he has any other concerning symptoms in the interval.  The patient voices understanding of current disease status and treatment options and is in agreement with the current care plan.  All questions were answered. The patient knows to call the clinic with any problems, questions or concerns. We can certainly see the patient much sooner if necessary.  Disclaimer: This note was dictated with voice recognition software. Similar sounding words can inadvertently be transcribed and may not be corrected upon review.

## 2021-09-20 LAB — T4: T4, Total: 8.1 ug/dL (ref 4.5–12.0)

## 2021-10-03 ENCOUNTER — Telehealth: Payer: Self-pay | Admitting: Internal Medicine

## 2021-10-03 NOTE — Telephone Encounter (Signed)
Called patient regarding upcoming October and November appointments, left a voicemail.

## 2021-10-04 ENCOUNTER — Other Ambulatory Visit: Payer: Self-pay

## 2021-10-06 ENCOUNTER — Other Ambulatory Visit: Payer: Self-pay

## 2021-10-08 NOTE — Progress Notes (Unsigned)
[place] [id number]  DIAGNOSIS: Metastatic non-small cell lung cancer initially diagnosed as stage IIB (T1b, N1, M0) non-small cell lung cancer, invasive poorly differentiated carcinoma diagnosed in June 2021 with disease recurrence in November 2021 Molecular studies are negative for EGFR mutation as well as ALK gene translocation.  PD-L1 expression was 50-100%.  This is based on the report from the [facility] trial. Molecular studies by [facility]: Showed no actionable mutations and PD-L1 expression was 90%  PRIOR THERAPY: 1) Status post right upper lobectomy with lymph node dissection on June 17, 2019 under the care of Dr. Roxan Hockey. 2) Adjuvant systemic chemotherapy with cisplatin 75 mg/M2 and Alimta 500 mg/M2 every 3 weeks.  First dose September 02, 2019. Status post 3 cycles.  His treatment was discontinued secondary to intolerance.  CURRENT THERAPY: First-line treatment with immunotherapy with Libtayo (Cempilimab) 350 mg IV every 3 weeks status post 29 cycles. First dose on 01/24/20.   INTERVAL HISTORY: Luis [name] 71 y.o. male returns to the clinic today for a follow-up visit.  The patient is feeling fairly well today without any concerning complaints except he mentions he has seen his eye doctor due to visual disturbances in his left eye. He mentions he has floaters across his vision when he looks a certain way. They evaluated him and recommended OTC eye drops. No erythema, discharge, or swelling.   The patient is currently undergoing immunotherapy with Libtayo.  He is tolerating this well except he did develop type 1 diabetes for which he is seeing endocrinology and taking insulin. He has a portable device that tells him his BS in real time. His BS during the exam today was 196.   The patient sometimes may develop a mild itching without rash with his treatment for which he will use hydrocortisone cream  if needed.   Today, he states he is fairly well. He reports his baseline dyspnea exertion secondary to his COPD.  He sometimes has occasional chest discomfort along his prior surgical site in the right chest wall.  Denies any cough. Denies hemoptysis.  Denies any nausea, vomiting, or constipation. He has chronic intermittent diarrhea, which is stable. He cannot take imodium because even a small amount causes him to have severe constipation. The patient is here today for evaluation before undergoing cycle #30.    MEDICAL HISTORY: Past Medical History:  Diagnosis Date   Arthritis    through out body   Chronic bronchitis (HCC)    Concussion    COPD (chronic obstructive pulmonary disease) ([facility])    Diabetes mellitus without complication (HCC)    Dyspnea    Emphysema lung (HCC)    Fatigue    Frozen shoulder    LEFT   GERD (gastroesophageal reflux disease)    Hard of hearing    Headache    alot of days, related to spine issues   Hypertension    Lumbar radiculopathy 2013   nscl ca 04/2019   Peyronie's disease    Prostatitis 2011   Dr. Gaynelle Arabian   RLS (restless legs syndrome)    Shingles 06/13/2018   shingles on back, finished with prednisone, stills has scabs and pain   Thigh pain    Bilateral Thigh   Thigh pain 10/2011   bilateral   Tobacco abuse 2010   still uses electronic cig/ emphysema, SOB easily   Vertigo    per wifes update   Wears partial  dentures    upper    ALLERGIES:  is allergic to ativan [lorazepam], doxycycline, and tramadol.  MEDICATIONS:  Current Outpatient Medications  Medication Sig Dispense Refill   acetaminophen (TYLENOL) 500 MG tablet Take 1,000 mg by mouth at bedtime as needed for moderate pain.     albuterol (VENTOLIN HFA) 108 (90 Base) MCG/ACT inhaler INHALE 1-2 PUFFS BY ORAL INHALATION EVERY 4 HOURS FOR CHRONIC OBSTRUCTIVE LUNG DISEASE BE SURE TO WASH MOUTHPIECE WITH WARM WATER ONCE A WEEK     amLODipine (NORVASC) 5 MG tablet TAKE ONE-HALF TABLET  BY MOUTH EVERY DAY FOR BLOOD PRESSURE. NOTE NEW MEDICINE     b complex vitamins capsule Take 1 capsule by mouth daily.     Cholecalciferol (VITAMIN D) 125 MCG (5000 UT) CAPS Take 5,000 Units by mouth daily.     citalopram (CELEXA) 20 MG tablet Take 1 tablet by mouth daily.     Continuous Blood Gluc Receiver ([place]) DEVI by Does not apply route.     Continuous Blood Gluc Sensor (DEXCOM G7 SENSOR) MISC by Does not apply route.     fluticasone-salmeterol (ADVAIR) 250-50 MCG/ACT AEPB 1 puff     glucose blood (ACCU-CHEK GUIDE) test strip See admin instructions.     insulin aspart (NOVOLOG FLEXPEN) 100 UNIT/ML FlexPen Max daily 30 units 15 mL 11   insulin glargine (LANTUS SOLOSTAR) 100 UNIT/ML Solostar Pen Inject 10 Units into the skin daily. (Patient taking differently: Inject 8 Units into the skin daily.) 15 mL 6   Insulin Pen Needle 32G X 4 MM MISC 1 Device by Does not apply route in the morning, at noon, in the evening, and at bedtime. 400 each 3   lidocaine-prilocaine (EMLA) cream Apply 1 Application topically as needed. Apply 1 tsp over port site at least 45 minutes prior to lab appointment.Do not rub in cream. Cover with plastic wrap. 30 g 5   losartan (COZAAR) 50 MG tablet TAKE ONE TABLET BY MOUTH EVERY DAY FOR HYPERTENSION     Multiple Vitamin (MULTIVITAMIN) tablet Take 1 tablet by mouth 2 (two) times a week.     vitamin B-12 (CYANOCOBALAMIN) 500 MCG tablet Take 500 mcg by mouth daily.     No current facility-administered medications for this visit.    SURGICAL HISTORY:  Past Surgical History:  Procedure Laterality Date   CATARACT EXTRACTION W/PHACO Right 07/08/2018   Procedure: CATARACT EXTRACTION PHACO AND INTRAOCULAR LENS PLACEMENT ([facility]) RIGHT;  Surgeon: Leandrew Koyanagi, MD;  Location: [facility];  Service: Ophthalmology;  Laterality: Right;   CATARACT EXTRACTION W/PHACO Left 07/29/2018   Procedure: CATARACT EXTRACTION PHACO AND INTRAOCULAR LENS PLACEMENT ([facility])  LEFT TORIC LENS;  Surgeon: Leandrew Koyanagi, MD;  Location: [facility];  Service: Ophthalmology;  Laterality: Left;   CHEST TUBE INSERTION Right 07/01/2019   Procedure: CHEST TUBE INSERTION;  Surgeon: [name], MD;  Location: [facility];  Service: Thoracic;  Laterality: Right;   COLONOSCOPY     EYE SURGERY     INTERCOSTAL NERVE BLOCK Right 06/17/2019   Procedure: INTERCOSTAL NERVE BLOCK;  Surgeon: [name], MD;  Location: [facility];  Service: Thoracic;  Laterality: Right;   INTERCOSTAL NERVE BLOCK Right 07/01/2019   Procedure: INTERCOSTAL NERVE BLOCK;  Surgeon: [name], MD;  Location: [facility];  Service: Thoracic;  Laterality: Right;   IR IMAGING GUIDED PORT INSERTION  02/21/2020   TONSILLECTOMY     VIDEO ASSISTED THORACOSCOPY Right 07/01/2019   Procedure: VIDEO ASSISTED THORACOSCOPY -  REMOVAL OF RIGHT MIDDLE LOBE BLEBS;  Surgeon: [name], MD;  Location: [facility] OR;  Service: Thoracic;  Laterality: Right;   VIDEO BRONCHOSCOPY WITH INSERTION OF INTERBRONCHIAL VALVE (IBV) Right 06/25/2019   Procedure: VIDEO BRONCHOSCOPY WITH INSERTION OF INTERBRONCHIAL VALVE (IBV); Size 7 IBV into Right Loer Lobe (RLL) Superior Segment, Size 9 IBV into RLL Basilar Segment;  Surgeon: [name], MD;  Location: [facility] OR;  Service: Thoracic;  Laterality: Right;   VIDEO BRONCHOSCOPY WITH INSERTION OF INTERBRONCHIAL VALVE (IBV) N/A 08/06/2019   Procedure: VIDEO BRONCHOSCOPY WITH REMOVAL OF INTERBRONCHIAL VALVE (IBV);  Surgeon: [name], MD;  Location: [facility] OR;  Service: Thoracic;  Laterality: N/A;    REVIEW OF SYSTEMS:   Constitutional: Positive for few pound weight loss. Negative for appetite change, chills, fatigue, and fever.  HENT: Negative for mouth sores, nosebleeds, sore throat and trouble swallowing.   Eyes: Negative for eye problems and icterus.  Respiratory: Positive for baseline dyspnea exertion.  Negative for cough, hemoptysis, and wheezing.    Cardiovascular: Negative for chest pain and leg swelling.  Gastrointestinal: Positive for baseline intermittent diarrhea.  No diarrhea at this time.  Positive for intermittent suprapubic discomfort.  Negative for constipation, nausea and vomiting.  Genitourinary: Negative for bladder incontinence, difficulty urinating, dysuria, frequency and hematuria.   Musculoskeletal: Negative for back pain, gait problem, neck pain and neck stiffness.  Skin: Negative for itching and rash.  Neurological:  Negative for dizziness, headaches, extremity weakness, gait problem, light-headedness and seizures.  Hematological: Negative for adenopathy. Does not bruise/bleed easily.  Psychiatric/Behavioral: Negative for confusion, depression and sleep disturbance. The patient is not nervous/anxious.      PHYSICAL EXAMINATION:  Blood pressure 135/82, pulse 83, temperature 98 F (36.7 C), temperature source Oral, resp. rate 17, height 5' 6"  (1.676 m), weight 136 lb 6.4 oz (61.9 kg), SpO2 96 %.  ECOG PERFORMANCE STATUS: 1  Physical Exam  Constitutional: Oriented to person, place, and time and well-developed, well-nourished, and in no distress.  HENT:  Head: Normocephalic and atraumatic.  Mouth/Throat: Oropharynx is clear and moist. No oropharyngeal exudate.  Eyes: Conjunctivae are normal. Right eye exhibits no discharge. Left eye exhibits no discharge. No scleral icterus.  Neck: Normal range of motion. Neck supple.  Cardiovascular: Normal rate, regular rhythm, normal heart sounds and intact distal pulses.   Pulmonary/Chest: Effort normal. Positive for quiet breath sounds bilaterally. No respiratory distress. No wheezes. No rales.  Abdominal: Soft. Bowel sounds are normal. Exhibits no distension and no mass. There is no tenderness.  Musculoskeletal: Normal range of motion. Exhibits no edema.  Lymphadenopathy:    No cervical adenopathy.  Neurological: Alert and oriented to person, place, and time. Exhibits normal  muscle tone. Gait normal. Coordination normal.  Skin: Skin is warm and dry. No rash noted. Not diaphoretic. No erythema. No pallor.  Psychiatric: Mood, memory and judgment normal.  Vitals reviewed.  LABORATORY DATA: Lab Results  Component Value Date   WBC 9.2 10/10/2021   HGB 15.3 10/10/2021   HCT 42.4 10/10/2021   MCV 86.4 10/10/2021   PLT 219 10/10/2021      Chemistry      Component Value Date/Time   NA 135 10/10/2021 1406   K 4.4 10/10/2021 1406   CL 102 10/10/2021 1406   CO2 28 10/10/2021 1406   BUN 23 10/10/2021 1406   CREATININE 1.27 (H) 10/10/2021 1406      Component Value Date/Time   CALCIUM 9.6 10/10/2021 1406   ALKPHOS 43  10/10/2021 1406   AST 15 10/10/2021 1406   ALT 16 10/10/2021 1406   BILITOT 0.4 10/10/2021 1406       RADIOGRAPHIC STUDIES:  No results found.   ASSESSMENT/PLAN:  This is a very pleasant 71 year old Caucasian male with metastatic lung cancer initially diagnosed as stage IIb (T1b, N1, M0) non-small cell lung cancer, adenocarcinoma.  He is status post a right upper lobe lobectomy with lymph node dissection under the care of Dr. Roxan Hockey.  This was performed on June 17, 2019.  He has no actionable mutations.  His PD-L1 expression is 90%. He had evidence for metastatic disease in November 2021 with small right pleural effusion as well as multiple right pleural implants.    The patient completed 3 of the 4 planned adjuvant chemotherapy cycles with cisplatin 75 mg per metered square and Alimta 500 mg/m. This was discontinued after cycle #3 due to intolerance.   When the patient was found to have evidence for disease recurrence, the patient was started on immunotherapy with Libtayo (Cempilimab) 350 mg IV every 3 weeks status post 27 cycles. He is tolerating this well.   The patient had a restaging CT scan a few months ago which showed a slowly enlarging subsolid nodule which we are monitoring closely.  Dr. Julien Nordmann mention that if this continues  to enlarge we would consider SBRT.  Labs were reviewed. He will proceed with cycle # 30 today as scheduled.    We will see him back for follow-up visit in 3 weeks for evaluation and repeat blood work before starting cycle #31.  The patient was advised to call immediately if he has any concerning symptoms in the interval. The patient voices understanding of current disease status and treatment options and is in agreement with the current care plan. All questions were answered. The patient knows to call the clinic with any problems, questions or concerns. We can certainly see the patient much sooner if necessary     No orders of the defined types were placed in this encounter.    The total time spent in the appointment was 20-29 minutes.   Zaire Vanbuskirk L Dimitri Dsouza, PA-C 10/10/21

## 2021-10-10 ENCOUNTER — Inpatient Hospital Stay (HOSPITAL_BASED_OUTPATIENT_CLINIC_OR_DEPARTMENT_OTHER): Payer: Medicare Other | Admitting: Physician Assistant

## 2021-10-10 ENCOUNTER — Other Ambulatory Visit: Payer: Self-pay

## 2021-10-10 ENCOUNTER — Inpatient Hospital Stay: Payer: Medicare Other

## 2021-10-10 VITALS — BP 135/82 | HR 83 | Temp 98.0°F | Resp 17 | Ht 66.0 in | Wt 136.4 lb

## 2021-10-10 DIAGNOSIS — C3491 Malignant neoplasm of unspecified part of right bronchus or lung: Secondary | ICD-10-CM

## 2021-10-10 DIAGNOSIS — Z79899 Other long term (current) drug therapy: Secondary | ICD-10-CM | POA: Diagnosis not present

## 2021-10-10 DIAGNOSIS — Z5112 Encounter for antineoplastic immunotherapy: Secondary | ICD-10-CM | POA: Diagnosis not present

## 2021-10-10 DIAGNOSIS — Z7951 Long term (current) use of inhaled steroids: Secondary | ICD-10-CM | POA: Diagnosis not present

## 2021-10-10 DIAGNOSIS — Z794 Long term (current) use of insulin: Secondary | ICD-10-CM | POA: Diagnosis not present

## 2021-10-10 DIAGNOSIS — E109 Type 1 diabetes mellitus without complications: Secondary | ICD-10-CM | POA: Diagnosis not present

## 2021-10-10 DIAGNOSIS — C3411 Malignant neoplasm of upper lobe, right bronchus or lung: Secondary | ICD-10-CM | POA: Diagnosis not present

## 2021-10-10 LAB — CMP (CANCER CENTER ONLY)
ALT: 16 U/L (ref 0–44)
AST: 15 U/L (ref 15–41)
Albumin: 4.3 g/dL (ref 3.5–5.0)
Alkaline Phosphatase: 43 U/L (ref 38–126)
Anion gap: 5 (ref 5–15)
BUN: 23 mg/dL (ref 8–23)
CO2: 28 mmol/L (ref 22–32)
Calcium: 9.6 mg/dL (ref 8.9–10.3)
Chloride: 102 mmol/L (ref 98–111)
Creatinine: 1.27 mg/dL — ABNORMAL HIGH (ref 0.61–1.24)
GFR, Estimated: >60 mL/min (ref >60)
Glucose, Bld: 240 mg/dL — ABNORMAL HIGH (ref 70–99)
Potassium: 4.4 mmol/L (ref 3.5–5.1)
Sodium: 135 mmol/L (ref 135–145)
Total Bilirubin: 0.4 mg/dL (ref 0.3–1.2)
Total Protein: 6.6 g/dL (ref 6.5–8.1)

## 2021-10-10 LAB — CBC WITH DIFFERENTIAL (CANCER CENTER ONLY)
Abs Immature Granulocytes: 0.03 10*3/uL (ref 0.00–0.07)
Basophils Absolute: 0.1 10*3/uL (ref 0.0–0.1)
Basophils Relative: 1 %
Eosinophils Absolute: 0.4 10*3/uL (ref 0.0–0.5)
Eosinophils Relative: 4 %
HCT: 42.4 % (ref 39.0–52.0)
Hemoglobin: 15.3 g/dL (ref 13.0–17.0)
Immature Granulocytes: 0 %
Lymphocytes Relative: 15 %
Lymphs Abs: 1.4 10*3/uL (ref 0.7–4.0)
MCH: 31.2 pg (ref 26.0–34.0)
MCHC: 36.1 g/dL — ABNORMAL HIGH (ref 30.0–36.0)
MCV: 86.4 fL (ref 80.0–100.0)
Monocytes Absolute: 0.6 10*3/uL (ref 0.1–1.0)
Monocytes Relative: 7 %
Neutro Abs: 6.7 10*3/uL (ref 1.7–7.7)
Neutrophils Relative %: 73 %
Platelet Count: 219 10*3/uL (ref 150–400)
RBC: 4.91 MIL/uL (ref 4.22–5.81)
RDW: 13.1 % (ref 11.5–15.5)
WBC Count: 9.2 10*3/uL (ref 4.0–10.5)
nRBC: 0 % (ref 0.0–0.2)

## 2021-10-10 LAB — TSH: TSH: 1.14 u[IU]/mL (ref 0.350–4.500)

## 2021-10-10 MED ORDER — SODIUM CHLORIDE 0.9 % IV SOLN: Freq: Once | INTRAVENOUS | Status: AC

## 2021-10-10 MED ORDER — HEPARIN SOD (PORK) LOCK FLUSH 100 UNIT/ML IV SOLN
500.0000 [IU] | Freq: Once | INTRAVENOUS | Status: AC | PRN
  Administered 2021-10-10: 500 [IU]

## 2021-10-10 MED ORDER — SODIUM CHLORIDE 0.9% FLUSH
10.0000 mL | INTRAVENOUS | Status: DC | PRN
  Administered 2021-10-10: 10 mL

## 2021-10-10 MED ORDER — SODIUM CHLORIDE 0.9 % IV SOLN
350.0000 mg | Freq: Once | INTRAVENOUS | Status: AC
  Administered 2021-10-10: 350 mg via INTRAVENOUS
  Filled 2021-10-10: qty 7

## 2021-10-10 NOTE — Patient Instructions (Signed)
[facility]  Discharge Instructions: Thank you for choosing [facility] to provide your oncology and hematology care.   If you have a lab appointment with the [facility], please go directly to the [facility] and check in at the registration area.   Wear comfortable clothing and clothing appropriate for easy access to any Portacath or PICC line.   We strive to give you quality time with your provider. You may need to reschedule your appointment if you arrive late (15 or more minutes).  Arriving late affects you and other patients whose appointments are after yours.  Also, if you miss three or more appointments without notifying the office, you may be dismissed from the clinic at the provider's discretion.      For prescription refill requests, have your pharmacy contact our office and allow 72 hours for refills to be completed.    Today you received the following chemotherapy and/or immunotherapy agents opdivo      To help prevent nausea and vomiting after your treatment, we encourage you to take your nausea medication as directed.  BELOW ARE SYMPTOMS THAT SHOULD BE REPORTED IMMEDIATELY: *FEVER GREATER THAN 100.4 F (38 C) OR HIGHER *CHILLS OR SWEATING *NAUSEA AND VOMITING THAT IS NOT CONTROLLED WITH YOUR NAUSEA MEDICATION *UNUSUAL SHORTNESS OF BREATH *UNUSUAL BRUISING OR BLEEDING *URINARY PROBLEMS (pain or burning when urinating, or frequent urination) *BOWEL PROBLEMS (unusual diarrhea, constipation, pain near the anus) TENDERNESS IN MOUTH AND THROAT WITH OR WITHOUT PRESENCE OF ULCERS (sore throat, sores in mouth, or a toothache) UNUSUAL RASH, SWELLING OR PAIN  UNUSUAL VAGINAL DISCHARGE OR ITCHING   Items with * indicate a potential emergency and should be followed up as soon as possible or go to the Emergency Department if any problems should occur.  Please show the CHEMOTHERAPY ALERT CARD or IMMUNOTHERAPY ALERT CARD at check-in to the  Emergency Department and triage nurse.  Should you have questions after your visit or need to cancel or reschedule your appointment, please contact [facility]  Dept: [phone]  and follow the prompts.  Office hours are 8:00 a.m. to 4:30 p.m. Monday - Friday. Please note that voicemails left after 4:00 p.m. may not be returned until the following business day.  We are closed weekends and major holidays. You have access to a nurse at all times for urgent questions. Please call the main number to the clinic Dept: [phone] and follow the prompts.   For any non-urgent questions, you may also contact your provider using MyChart. We now offer e-Visits for anyone 52 and older to request care online for non-urgent symptoms. For details visit [email or web address].   Also download the MyChart app! Go to the app store, search "MyChart", open the app, select , and log in with your MyChart username and password.  Masks are optional in the cancer centers. If you would like for your care team to wear a mask while they are taking care of you, please let them know. You may have one support person who is at least 70 years old accompany you for your appointments.

## 2021-10-12 LAB — T4: T4, Total: 8.8 ug/dL (ref 4.5–12.0)

## 2021-10-13 ENCOUNTER — Other Ambulatory Visit: Payer: Self-pay

## 2021-10-15 ENCOUNTER — Other Ambulatory Visit: Payer: Self-pay

## 2021-10-31 ENCOUNTER — Inpatient Hospital Stay: Payer: Medicare Other | Attending: Internal Medicine | Admitting: Internal Medicine

## 2021-10-31 ENCOUNTER — Inpatient Hospital Stay: Payer: Medicare Other

## 2021-10-31 VITALS — BP 129/77 | HR 85 | Temp 97.7°F | Resp 16 | Ht 66.0 in | Wt 136.4 lb

## 2021-10-31 DIAGNOSIS — E109 Type 1 diabetes mellitus without complications: Secondary | ICD-10-CM | POA: Insufficient documentation

## 2021-10-31 DIAGNOSIS — Z79899 Other long term (current) drug therapy: Secondary | ICD-10-CM | POA: Diagnosis not present

## 2021-10-31 DIAGNOSIS — Z794 Long term (current) use of insulin: Secondary | ICD-10-CM | POA: Insufficient documentation

## 2021-10-31 DIAGNOSIS — C3490 Malignant neoplasm of unspecified part of unspecified bronchus or lung: Secondary | ICD-10-CM | POA: Diagnosis not present

## 2021-10-31 DIAGNOSIS — C3491 Malignant neoplasm of unspecified part of right bronchus or lung: Secondary | ICD-10-CM

## 2021-10-31 DIAGNOSIS — Z5112 Encounter for antineoplastic immunotherapy: Secondary | ICD-10-CM | POA: Diagnosis not present

## 2021-10-31 DIAGNOSIS — C3411 Malignant neoplasm of upper lobe, right bronchus or lung: Secondary | ICD-10-CM | POA: Insufficient documentation

## 2021-10-31 DIAGNOSIS — C782 Secondary malignant neoplasm of pleura: Secondary | ICD-10-CM | POA: Insufficient documentation

## 2021-10-31 LAB — CMP (CANCER CENTER ONLY)
ALT: 16 U/L (ref 0–44)
AST: 16 U/L (ref 15–41)
Albumin: 4.2 g/dL (ref 3.5–5.0)
Alkaline Phosphatase: 40 U/L (ref 38–126)
Anion gap: 4 — ABNORMAL LOW (ref 5–15)
BUN: 18 mg/dL (ref 8–23)
CO2: 28 mmol/L (ref 22–32)
Calcium: 9 mg/dL (ref 8.9–10.3)
Chloride: 101 mmol/L (ref 98–111)
Creatinine: 1.25 mg/dL — ABNORMAL HIGH (ref 0.61–1.24)
GFR, Estimated: >60 mL/min (ref >60)
Glucose, Bld: 331 mg/dL — ABNORMAL HIGH (ref 70–99)
Potassium: 4.7 mmol/L (ref 3.5–5.1)
Sodium: 133 mmol/L — ABNORMAL LOW (ref 135–145)
Total Bilirubin: 0.7 mg/dL (ref 0.3–1.2)
Total Protein: 6.4 g/dL — ABNORMAL LOW (ref 6.5–8.1)

## 2021-10-31 LAB — CBC WITH DIFFERENTIAL (CANCER CENTER ONLY)
Abs Immature Granulocytes: 0.02 10*3/uL (ref 0.00–0.07)
Basophils Absolute: 0.1 10*3/uL (ref 0.0–0.1)
Basophils Relative: 1 %
Eosinophils Absolute: 0.2 10*3/uL (ref 0.0–0.5)
Eosinophils Relative: 3 %
HCT: 42.7 % (ref 39.0–52.0)
Hemoglobin: 15.2 g/dL (ref 13.0–17.0)
Immature Granulocytes: 0 %
Lymphocytes Relative: 14 %
Lymphs Abs: 1.1 10*3/uL (ref 0.7–4.0)
MCH: 31 pg (ref 26.0–34.0)
MCHC: 35.6 g/dL (ref 30.0–36.0)
MCV: 87.1 fL (ref 80.0–100.0)
Monocytes Absolute: 0.5 10*3/uL (ref 0.1–1.0)
Monocytes Relative: 6 %
Neutro Abs: 5.9 10*3/uL (ref 1.7–7.7)
Neutrophils Relative %: 76 %
Platelet Count: 213 10*3/uL (ref 150–400)
RBC: 4.9 MIL/uL (ref 4.22–5.81)
RDW: 12.9 % (ref 11.5–15.5)
WBC Count: 7.8 10*3/uL (ref 4.0–10.5)
nRBC: 0 % (ref 0.0–0.2)

## 2021-10-31 LAB — TSH: TSH: 1.231 u[IU]/mL (ref 0.350–4.500)

## 2021-10-31 MED ORDER — SODIUM CHLORIDE 0.9 % IV SOLN
350.0000 mg | Freq: Once | INTRAVENOUS | Status: AC
  Administered 2021-10-31: 350 mg via INTRAVENOUS
  Filled 2021-10-31: qty 7

## 2021-10-31 MED ORDER — SODIUM CHLORIDE 0.9 % IV SOLN: Freq: Once | INTRAVENOUS | Status: AC

## 2021-10-31 MED ORDER — HEPARIN SOD (PORK) LOCK FLUSH 100 UNIT/ML IV SOLN
500.0000 [IU] | Freq: Once | INTRAVENOUS | Status: AC | PRN
  Administered 2021-10-31: 500 [IU]

## 2021-10-31 MED ORDER — SODIUM CHLORIDE 0.9% FLUSH
10.0000 mL | INTRAVENOUS | Status: DC | PRN
  Administered 2021-10-31: 10 mL

## 2021-10-31 NOTE — Progress Notes (Signed)
[place] [phone]   Fax:([phone]) [place], [facility] [address] [place] [address]  DIAGNOSIS:  1) Metastatic non-small cell lung cancer initially diagnosed as stage IIB (T1b, N1, M0) non-small cell lung cancer, invasive poorly differentiated carcinoma diagnosed in June 2021 with disease recurrence in November 2021 Molecular studies are negative for EGFR mutation as well as ALK gene translocation.  PD-L1 expression was 50-100%.  This is based on the report from the [facility] trial. Molecular studies by [employer]: Showed no actionable mutations and PD-L1 expression was 90% 2) immunotherapy induced type 1 diabetes mellitus.  PRIOR THERAPY:  1) Status post right upper lobectomy with lymph node dissection on June 17, 2019 under the care of Dr. Roxan Hockey. 2) Adjuvant systemic chemotherapy with cisplatin 75 mg/M2 and Alimta 500 mg/M2 every 3 weeks.  First dose September 02, 2019. Status post 3 cycles.  His treatment was discontinued secondary to intolerance.  CURRENT THERAPY: First-line treatment with immunotherapy with Libtayo (Cempilimab) 350 mg IV every 3 weeks status post 30 cycles.  INTERVAL HISTORY: Luis [name] 71 y.o. male returns to the clinic today for follow-up visit.  The patient is feeling fine today with no concerning complaints except for mild shortness of breath with exertion.  He denied having any chest pain, cough or hemoptysis.  He has no nausea, vomiting, diarrhea or constipation.  He has no headache or visual changes.  He denied having any recent weight loss or night sweats.  He has been tolerating his treatment with Libtayo (Cempilimab) fairly well.  He is here today for evaluation before starting cycle #31.   MEDICAL HISTORY: Past Medical History:  Diagnosis Date   Arthritis    through out body   Chronic bronchitis (HCC)    Concussion    COPD (chronic obstructive pulmonary disease) (HCC)     Diabetes mellitus without complication (HCC)    Dyspnea    Emphysema lung (HCC)    Fatigue    Frozen shoulder    LEFT   GERD (gastroesophageal reflux disease)    Hard of hearing    Headache    alot of days, related to spine issues   Hypertension    Lumbar radiculopathy 2013   nscl ca 04/2019   Peyronie's disease    Prostatitis 2011   Dr. Gaynelle Arabian   RLS (restless legs syndrome)    Shingles 06/13/2018   shingles on back, finished with prednisone, stills has scabs and pain   Thigh pain    Bilateral Thigh   Thigh pain 10/2011   bilateral   Tobacco abuse 2010   still uses electronic cig/ emphysema, SOB easily   Vertigo    per wifes update   Wears partial dentures    upper    ALLERGIES:  is allergic to ativan [lorazepam], doxycycline, and tramadol.  MEDICATIONS:  Current Outpatient Medications  Medication Sig Dispense Refill   acetaminophen (TYLENOL) 500 MG tablet Take 1,000 mg by mouth at bedtime as needed for moderate pain.     albuterol (VENTOLIN HFA) 108 (90 Base) MCG/ACT inhaler INHALE 1-2 PUFFS BY ORAL INHALATION EVERY 4 HOURS FOR CHRONIC OBSTRUCTIVE LUNG DISEASE BE SURE TO WASH MOUTHPIECE WITH WARM WATER ONCE A WEEK     amLODipine (NORVASC) 5 MG tablet TAKE ONE-HALF TABLET BY MOUTH EVERY DAY FOR BLOOD PRESSURE. NOTE NEW MEDICINE     b complex vitamins capsule Take 1 capsule by mouth daily.  Cholecalciferol (VITAMIN D) 125 MCG (5000 UT) CAPS Take 5,000 Units by mouth daily.     citalopram (CELEXA) 20 MG tablet Take 1 tablet by mouth daily.     Continuous Blood Gluc Receiver ([place]) DEVI by Does not apply route.     Continuous Blood Gluc Sensor (DEXCOM G7 SENSOR) MISC by Does not apply route.     fluticasone-salmeterol (ADVAIR) 250-50 MCG/ACT AEPB 1 puff     glucose blood (ACCU-CHEK GUIDE) test strip See admin instructions.     insulin aspart (NOVOLOG FLEXPEN) 100 UNIT/ML FlexPen Max daily 30 units 15 mL 11   insulin glargine (LANTUS SOLOSTAR) 100  UNIT/ML Solostar Pen Inject 10 Units into the skin daily. (Patient taking differently: Inject 8 Units into the skin daily.) 15 mL 6   Insulin Pen Needle 32G X 4 MM MISC 1 Device by Does not apply route in the morning, at noon, in the evening, and at bedtime. 400 each 3   lidocaine-prilocaine (EMLA) cream Apply 1 Application topically as needed. Apply 1 tsp over port site at least 45 minutes prior to lab appointment.Do not rub in cream. Cover with plastic wrap. 30 g 5   losartan (COZAAR) 50 MG tablet TAKE ONE TABLET BY MOUTH EVERY DAY FOR HYPERTENSION     Multiple Vitamin (MULTIVITAMIN) tablet Take 1 tablet by mouth 2 (two) times a week.     vitamin B-12 (CYANOCOBALAMIN) 500 MCG tablet Take 500 mcg by mouth daily.     No current facility-administered medications for this visit.    SURGICAL HISTORY:  Past Surgical History:  Procedure Laterality Date   CATARACT EXTRACTION W/PHACO Right 07/08/2018   Procedure: CATARACT EXTRACTION PHACO AND INTRAOCULAR LENS PLACEMENT ([place]) RIGHT;  Surgeon: Leandrew Koyanagi, MD;  Location: [place];  Service: Ophthalmology;  Laterality: Right;   CATARACT EXTRACTION W/PHACO Left 07/29/2018   Procedure: CATARACT EXTRACTION PHACO AND INTRAOCULAR LENS PLACEMENT ([place]) LEFT TORIC LENS;  Surgeon: Leandrew Koyanagi, MD;  Location: [place];  Service: Ophthalmology;  Laterality: Left;   CHEST TUBE INSERTION Right 07/01/2019   Procedure: CHEST TUBE INSERTION;  Surgeon: [name], MD;  Location: [place];  Service: Thoracic;  Laterality: Right;   COLONOSCOPY     EYE SURGERY     INTERCOSTAL NERVE BLOCK Right 06/17/2019   Procedure: INTERCOSTAL NERVE BLOCK;  Surgeon: [name], MD;  Location: [place];  Service: Thoracic;  Laterality: Right;   INTERCOSTAL NERVE BLOCK Right 07/01/2019   Procedure: INTERCOSTAL NERVE BLOCK;  Surgeon: [name], MD;  Location: [place];  Service: Thoracic;  Laterality: Right;   IR IMAGING GUIDED  PORT INSERTION  02/21/2020   TONSILLECTOMY     VIDEO ASSISTED THORACOSCOPY Right 07/01/2019   Procedure: VIDEO ASSISTED THORACOSCOPY - REMOVAL OF RIGHT MIDDLE LOBE BLEBS;  Surgeon: [name], MD;  Location: [place];  Service: Thoracic;  Laterality: Right;   VIDEO BRONCHOSCOPY WITH INSERTION OF INTERBRONCHIAL VALVE (IBV) Right 06/25/2019   Procedure: VIDEO BRONCHOSCOPY WITH INSERTION OF INTERBRONCHIAL VALVE (IBV); Size 7 IBV into Right Loer Lobe (RLL) Superior Segment, Size 9 IBV into RLL Basilar Segment;  Surgeon: [name], MD;  Location: [facility];  Service: Thoracic;  Laterality: Right;   VIDEO BRONCHOSCOPY WITH INSERTION OF INTERBRONCHIAL VALVE (IBV) N/A 08/06/2019   Procedure: VIDEO BRONCHOSCOPY WITH REMOVAL OF INTERBRONCHIAL VALVE (IBV);  Surgeon: [name], MD;  [facility];  Service: Thoracic;  Laterality: N/A;    REVIEW OF SYSTEMS:  A comprehensive review of systems was negative except for: Respiratory: positive for dyspnea on exertion   PHYSICAL EXAMINATION: General appearance: alert, cooperative, and no distress Head: Normocephalic, without obvious abnormality, atraumatic Neck: no adenopathy, no JVD, supple, symmetrical, trachea midline, and thyroid not enlarged, symmetric, no tenderness/mass/nodules Lymph nodes: Cervical, supraclavicular, and axillary nodes normal. Resp: clear to auscultation bilaterally Back: symmetric, no curvature. ROM normal. No CVA tenderness. Cardio: regular rate and rhythm, S1, S2 normal, no murmur, click, rub or gallop GI: soft, non-tender; bowel sounds normal; no masses,  no organomegaly Extremities: extremities normal, atraumatic, no cyanosis or edema  ECOG PERFORMANCE STATUS: 1 - Symptomatic but completely ambulatory  Blood pressure 129/77, pulse 85, temperature 97.7 F (36.5 C), temperature source Oral, resp. rate 16, height $RemoveBe'5\' 6"'jfMevhoyE$  (1.676 m), weight 136 lb 6 oz (61.9 kg), SpO2 98 %.  LABORATORY DATA: Lab Results   Component Value Date   WBC 7.8 10/31/2021   HGB 15.2 10/31/2021   HCT 42.7 10/31/2021   MCV 87.1 10/31/2021   PLT 213 10/31/2021      Chemistry      Component Value Date/Time   NA 135 10/10/2021 1406   K 4.4 10/10/2021 1406   CL 102 10/10/2021 1406   CO2 28 10/10/2021 1406   BUN 23 10/10/2021 1406   CREATININE 1.27 (H) 10/10/2021 1406      Component Value Date/Time   CALCIUM 9.6 10/10/2021 1406   ALKPHOS 43 10/10/2021 1406   AST 15 10/10/2021 1406   ALT 16 10/10/2021 1406   BILITOT 0.4 10/10/2021 1406       RADIOGRAPHIC STUDIES: No results found.  ASSESSMENT AND PLAN: This is a very pleasant 71 years old white male recently diagnosed with stage IIb (T1b, N1, M0) non-small cell lung cancer, adenocarcinoma status post right upper lobectomy with lymph node dissection under the care of Dr. Roxan Hockey on June 17, 2019. The patient had molecular studies on the [facility] clinical trial that showed negative for EGFR mutation as well as ALK gene translocation but PD-L1 expression in the range of 50-100%. The patient underwent standard adjuvant systemic chemotherapy with cisplatin 75 mg/M2 and Alimta 500 mg/M2 every 3 weeks status post 3 cycles.  His treatment was discontinued secondary to intolerance. Unfortunately repeat CT scan as well as PET scan after completion of the adjuvant chemotherapy showed concerning findings for disease recurrence with small right pleural effusion as well as multiple pleural implants consistent with metastatic disease which was confirmed with repeat biopsy. He had molecular studies by [employer] and the blood test showed no actionable mutation.  His PD-L1 expression was 90%. The patient is currently undergoing treatment with immunotherapy with Libtayo (Cempilimab) 350 mg IV every 3 weeks status post 30 cycles. He was diagnosed with immunotherapy mediated type 1 diabetes mellitus and currently on treatment with insulin. The patient has been tolerating his  treatment with immunotherapy fairly well. I recommended for him to proceed with cycle #1 today as planned. I will see him back for follow-up visit in 3 weeks for evaluation with repeat CT scan of the chest, abdomen and pelvis for restaging of his disease. He was advised to call immediately if he has any other concerning symptoms in the interval.  The patient voices understanding of current disease status and treatment options and is in agreement with the current care plan.  All questions were answered. The patient knows to call the clinic with any problems, questions or concerns. We can certainly see the patient much  sooner if necessary.  Disclaimer: This note was dictated with voice recognition software. Similar sounding words can inadvertently be transcribed and may not be corrected upon review.

## 2021-11-01 LAB — T4: T4, Total: 8.9 ug/dL (ref 4.5–12.0)

## 2021-11-08 ENCOUNTER — Ambulatory Visit: Payer: Medicare Other | Admitting: Internal Medicine

## 2021-11-13 ENCOUNTER — Other Ambulatory Visit: Payer: Self-pay

## 2021-11-16 ENCOUNTER — Ambulatory Visit
Admission: RE | Admit: 2021-11-16 | Discharge: 2021-11-16 | Disposition: A | Discharge status: Home / Self Care | Payer: Medicare Other | Source: Ambulatory Visit | Attending: Internal Medicine | Admitting: Internal Medicine

## 2021-11-16 DIAGNOSIS — C3490 Malignant neoplasm of unspecified part of unspecified bronchus or lung: Secondary | ICD-10-CM | POA: Diagnosis not present

## 2021-11-16 DIAGNOSIS — K5730 Diverticulosis of large intestine without perforation or abscess without bleeding: Secondary | ICD-10-CM | POA: Diagnosis not present

## 2021-11-16 DIAGNOSIS — K7689 Other specified diseases of liver: Secondary | ICD-10-CM | POA: Diagnosis not present

## 2021-11-16 DIAGNOSIS — C3411 Malignant neoplasm of upper lobe, right bronchus or lung: Secondary | ICD-10-CM | POA: Diagnosis not present

## 2021-11-16 DIAGNOSIS — R918 Other nonspecific abnormal finding of lung field: Secondary | ICD-10-CM | POA: Diagnosis not present

## 2021-11-16 MED ORDER — IOHEXOL 300 MG/ML  SOLN
85.0000 mL | Freq: Once | INTRAMUSCULAR | Status: AC | PRN
  Administered 2021-11-16: 85 mL via INTRAVENOUS

## 2021-11-16 MED ORDER — HEPARIN SOD (PORK) LOCK FLUSH 100 UNIT/ML IV SOLN
500.0000 [IU] | Freq: Once | INTRAVENOUS | Status: AC
  Administered 2021-11-16: 500 [IU] via INTRAVENOUS

## 2021-11-16 NOTE — Progress Notes (Unsigned)
[place] [address]  DIAGNOSIS: Metastatic non-small cell lung cancer initially diagnosed as stage IIB (T1b, N1, M0) non-small cell lung cancer, invasive poorly differentiated carcinoma diagnosed in June 2021 with disease recurrence in November 2021 Molecular studies are negative for EGFR mutation as well as ALK gene translocation.  PD-L1 expression was 50-100%.  This is based on the report from the [facility] trial. Molecular studies by [facility]: Showed no actionable mutations and PD-L1 expression was 90%  PRIOR THERAPY: ) Status post right upper lobectomy with lymph node dissection on June 17, 2019 under the care of Dr. Roxan Hockey. 2) Adjuvant systemic chemotherapy with cisplatin 75 mg/M2 and Alimta 500 mg/M2 every 3 weeks.  First dose September 02, 2019. Status post 3 cycles.  His treatment was discontinued secondary to intolerance.  CURRENT THERAPY:  First-line treatment with immunotherapy with Libtayo (Cempilimab) 350 mg IV every 3 weeks status post 31 cycles. First dose on 01/24/20.    INTERVAL HISTORY: Luis [name] 71 y.o. male returns to the clinic today for a follow-up visit. The patient is feeling fairly well today without any concerning complaints. The patient is currently undergoing immunotherapy with Libtayo.  He is tolerating this well except he did develop type 1 diabetes for which he is seeing endocrinology and taking insulin. He has a portable device that tells him his BS in real time. His BS during the exam today was 329. He states it spikes after he eats but is currently down trending.    The patient sometimes may develop a mild itching without rash with his treatment for which he will use hydrocortisone cream if needed. He states it is manageable.    Today, he states he is fairly well. He reports his baseline dyspnea exertion secondary to his COPD.  He sometimes has occasional chest  discomfort along his prior surgical site in the right chest wall.  Denies any cough. Denies hemoptysis.  Denies any nausea, vomiting, or constipation. He has chronic intermittent diarrhea, which is stable. He cannot take imodium because even a small amount causes him to have severe constipation.  The patient recently had a restaging CT scan performed.  The patient is here today for evaluation before undergoing cycle #32      MEDICAL HISTORY: Past Medical History:  Diagnosis Date   Arthritis    through out body   Chronic bronchitis (HCC)    Concussion    COPD (chronic obstructive pulmonary disease) ([place])    Diabetes mellitus without complication (HCC)    Dyspnea    Emphysema lung (HCC)    Fatigue    Frozen shoulder    LEFT   GERD (gastroesophageal reflux disease)    Hard of hearing    Headache    alot of days, related to spine issues   Hypertension    Lumbar radiculopathy 2013   nscl ca 04/2019   Peyronie's disease    Prostatitis 2011   Dr. Gaynelle Arabian   RLS (restless legs syndrome)    Shingles 06/13/2018   shingles on back, finished with prednisone, stills has scabs and pain   Thigh pain    Bilateral Thigh   Thigh pain 10/2011   bilateral   Tobacco abuse 2010   still uses electronic cig/ emphysema, SOB easily   Vertigo    per wifes update   Wears partial dentures    upper    ALLERGIES:  is allergic to  ativan [lorazepam], doxycycline, and tramadol.  MEDICATIONS:  Current Outpatient Medications  Medication Sig Dispense Refill   acetaminophen (TYLENOL) 500 MG tablet Take 1,000 mg by mouth at bedtime as needed for moderate pain.     albuterol (VENTOLIN HFA) 108 (90 Base) MCG/ACT inhaler INHALE 1-2 PUFFS BY ORAL INHALATION EVERY 4 HOURS FOR CHRONIC OBSTRUCTIVE LUNG DISEASE BE SURE TO WASH MOUTHPIECE WITH WARM WATER ONCE A WEEK     amLODipine (NORVASC) 5 MG tablet TAKE ONE-HALF TABLET BY MOUTH EVERY DAY FOR BLOOD PRESSURE. NOTE NEW MEDICINE     b complex vitamins capsule  Take 1 capsule by mouth daily.     Cholecalciferol (VITAMIN D) 125 MCG (5000 UT) CAPS Take 5,000 Units by mouth daily.     citalopram (CELEXA) 20 MG tablet Take 1 tablet by mouth daily.     Continuous Blood Gluc Receiver ([place]) DEVI by Does not apply route.     Continuous Blood Gluc Sensor (DEXCOM G7 SENSOR) MISC by Does not apply route.     fluticasone-salmeterol (ADVAIR) 250-50 MCG/ACT AEPB 1 puff     glucose blood (ACCU-CHEK GUIDE) test strip See admin instructions.     insulin aspart (NOVOLOG FLEXPEN) 100 UNIT/ML FlexPen Max daily 30 units 15 mL 11   insulin glargine (LANTUS SOLOSTAR) 100 UNIT/ML Solostar Pen Inject 10 Units into the skin daily. (Patient taking differently: Inject 8 Units into the skin daily.) 15 mL 6   Insulin Pen Needle 32G X 4 MM MISC 1 Device by Does not apply route in the morning, at noon, in the evening, and at bedtime. 400 each 3   lidocaine-prilocaine (EMLA) cream Apply 1 Application topically as needed. Apply 1 tsp over port site at least 45 minutes prior to lab appointment.Do not rub in cream. Cover with plastic wrap. 30 g 5   losartan (COZAAR) 50 MG tablet TAKE ONE TABLET BY MOUTH EVERY DAY FOR HYPERTENSION     Multiple Vitamin (MULTIVITAMIN) tablet Take 1 tablet by mouth 2 (two) times a week.     vitamin B-12 (CYANOCOBALAMIN) 500 MCG tablet Take 500 mcg by mouth daily.     No current facility-administered medications for this visit.    SURGICAL HISTORY:  Past Surgical History:  Procedure Laterality Date   CATARACT EXTRACTION W/PHACO Right 07/08/2018   Procedure: CATARACT EXTRACTION PHACO AND INTRAOCULAR LENS PLACEMENT ([place]) RIGHT;  Surgeon: Leandrew Koyanagi, MD;  Location: [facility];  Service: Ophthalmology;  Laterality: Right;   CATARACT EXTRACTION W/PHACO Left 07/29/2018   Procedure: CATARACT EXTRACTION PHACO AND INTRAOCULAR LENS PLACEMENT ([place]) LEFT TORIC LENS;  Surgeon: Leandrew Koyanagi, MD;  Location: [facility];   Service: Ophthalmology;  Laterality: Left;   CHEST TUBE INSERTION Right 07/01/2019   Procedure: CHEST TUBE INSERTION;  Surgeon: [name], MD;  Location: [facility];  Service: Thoracic;  Laterality: Right;   COLONOSCOPY     EYE SURGERY     INTERCOSTAL NERVE BLOCK Right 06/17/2019   Procedure: INTERCOSTAL NERVE BLOCK;  Surgeon: [name], MD;  Location: [facility];  Service: Thoracic;  Laterality: Right;   INTERCOSTAL NERVE BLOCK Right 07/01/2019   Procedure: INTERCOSTAL NERVE BLOCK;  Surgeon: [name], MD;  Location: [facility];  Service: Thoracic;  Laterality: Right;   IR IMAGING GUIDED PORT INSERTION  02/21/2020   TONSILLECTOMY     VIDEO ASSISTED THORACOSCOPY Right 07/01/2019   Procedure: VIDEO ASSISTED THORACOSCOPY - REMOVAL OF RIGHT MIDDLE LOBE BLEBS;  Surgeon: [name], MD;  Location: [facility];  Service: Thoracic;  Laterality: Right;   VIDEO BRONCHOSCOPY WITH INSERTION OF INTERBRONCHIAL VALVE (IBV) Right 06/25/2019   Procedure: VIDEO BRONCHOSCOPY WITH INSERTION OF INTERBRONCHIAL VALVE (IBV); Size 7 IBV into Right Loer Lobe (RLL) Superior Segment, Size 9 IBV into RLL Basilar Segment;  Surgeon: [name], MD;  Location: [facility];  Service: Thoracic;  Laterality: Right;   VIDEO BRONCHOSCOPY WITH INSERTION OF INTERBRONCHIAL VALVE (IBV) N/A 08/06/2019   Procedure: VIDEO BRONCHOSCOPY WITH REMOVAL OF INTERBRONCHIAL VALVE (IBV);  Surgeon: [name], MD;  [facility];  Service: Thoracic;  Laterality: N/A;    REVIEW OF SYSTEMS:   Constitutional: Positive for few pound weight loss. Negative for appetite change, chills, fatigue, and fever.  HENT: Negative for mouth sores, nosebleeds, sore throat and trouble swallowing.   Eyes: Negative for eye problems and icterus.  Respiratory: Positive for baseline dyspnea exertion.  Negative for cough, hemoptysis, and wheezing.   Cardiovascular: Negative for chest pain and leg swelling.  Gastrointestinal: Positive  for baseline intermittent diarrhea.  No diarrhea at this time.  Positive for intermittent suprapubic discomfort.  Negative for constipation, nausea and vomiting.  Genitourinary: Negative for bladder incontinence, difficulty urinating, dysuria, frequency and hematuria.   Musculoskeletal: Negative for back pain, gait problem, neck pain and neck stiffness.  Skin: Negative for itching and rash.  Neurological:  Negative for dizziness, headaches, extremity weakness, gait problem, light-headedness and seizures.  Hematological: Negative for adenopathy. Does not bruise/bleed easily.  Psychiatric/Behavioral: Negative for confusion, depression and sleep disturbance. The patient is not nervous/anxious.      PHYSICAL EXAMINATION:  Blood pressure 121/82, pulse 75, temperature 97.9 F (36.6 C), temperature source Oral, resp. rate 18, height 5' 6" (1.676 m), weight 136 lb 14.4 oz (62.1 kg), SpO2 98 %.  ECOG PERFORMANCE STATUS: 1  Physical Exam  Constitutional: Oriented to person, place, and time and well-developed, well-nourished, and in no distress.  HENT:  Head: Normocephalic and atraumatic.  Mouth/Throat: Oropharynx is clear and moist. No oropharyngeal exudate.  Eyes: Conjunctivae are normal. Right eye exhibits no discharge. Left eye exhibits no discharge. No scleral icterus.  Neck: Normal range of motion. Neck supple.  Cardiovascular: Normal rate, regular rhythm, normal heart sounds and intact distal pulses.   Pulmonary/Chest: Effort normal. Positive for quiet breath sounds bilaterally. No respiratory distress. No wheezes. No rales.  Abdominal: Soft. Bowel sounds are normal. Exhibits no distension and no mass. There is no tenderness.  Musculoskeletal: Normal range of motion. Exhibits no edema.  Lymphadenopathy:    No cervical adenopathy.  Neurological: Alert and oriented to person, place, and time. Exhibits normal muscle tone. Gait normal. Coordination normal.  Skin: Skin is warm and dry. No rash  noted. Not diaphoretic. No erythema. No pallor.  Psychiatric: Mood, memory and judgment normal.  Vitals reviewed.    LABORATORY DATA: Lab Results  Component Value Date   WBC 6.9 11/20/2021   HGB 14.6 11/20/2021   HCT 40.2 11/20/2021   MCV 87.4 11/20/2021   PLT 179 11/20/2021      Chemistry      Component Value Date/Time   NA 132 (L) 11/20/2021 0925   K 4.2 11/20/2021 0925   CL 99 11/20/2021 0925   CO2 29 11/20/2021 0925   BUN 22 11/20/2021 0925   CREATININE 1.24 11/20/2021 0925      Component Value Date/Time   CALCIUM 8.9 11/20/2021 0925   ALKPHOS 41 11/20/2021 0925   AST 15 11/20/2021 0925   ALT  13 11/20/2021 0925   BILITOT 0.6 11/20/2021 0925       RADIOGRAPHIC STUDIES:  CT CHEST ABDOMEN PELVIS W CONTRAST  Result Date: 11/19/2021 CLINICAL DATA:  Non-small cell right upper lobe lung cancer diagnosed April 2021 status post right upper lobectomy, chemotherapy and ongoing immunotherapy. Restaging. Patient reports right lower quadrant abdominal pain for 2 weeks. * Tracking Code: BO * EXAM: CT CHEST, ABDOMEN, AND PELVIS WITH CONTRAST TECHNIQUE: Multidetector CT imaging of the chest, abdomen and pelvis was performed following the standard protocol during bolus administration of intravenous contrast. RADIATION DOSE REDUCTION: This exam was performed according to the departmental dose-optimization program which includes automated exposure control, adjustment of the mA and/or kV according to patient size and/or use of iterative reconstruction technique. CONTRAST:  22m OMNIPAQUE IOHEXOL 300 MG/ML  SOLN COMPARISON:  08/27/2021 CT chest, abdomen and pelvis. FINDINGS: CT CHEST FINDINGS Cardiovascular: Normal heart size. No significant pericardial effusion/thickening. Left anterior descending coronary atherosclerosis. Right internal jugular Port-A-Cath terminates at the cavoatrial junction. Atherosclerotic nonaneurysmal thoracic aorta. Normal caliber pulmonary arteries. No central  pulmonary emboli. Mediastinum/Nodes: No significant thyroid nodules. Unremarkable esophagus. No pathologically enlarged axillary, mediastinal or hilar lymph nodes. Lungs/Pleura: No pneumothorax. Status post right upper lobectomy. Trace basilar right pleural effusion is stable. No left pleural effusion. Severe centrilobular emphysema. Subsolid 2.4 x 1.4 cm posterior left upper lobe pulmonary nodule abutting the major fissure was 0.5 cm solid component (series 3/image 42), previously 2.4 x 1.3 cm with 0.5 cm solid component using similar measurement technique, stable. Irregular solid 0.8 x 0.6 cm posterior left upper lobe pulmonary nodule (series 3/image 55), increased from 0.6 x 0.3 cm. No additional significant pulmonary nodules. Scattered suture lines and mild curvilinear parenchymal bands in the right lung, stable and compatible with nonspecific scarring. Musculoskeletal: No aggressive appearing focal osseous lesions. Mild thoracic spondylosis. CT ABDOMEN PELVIS FINDINGS Hepatobiliary: Normal liver size. Stable simple 1.3 cm left liver dome cyst. Subcentimeter hypodense posterior right liver lesion is too small to characterize and is unchanged, presumably benign. No new liver lesions. Normal gallbladder with no radiopaque cholelithiasis. No biliary ductal dilatation. Pancreas: Normal, with no mass or duct dilation. Spleen: Normal size spleen. Stable granulomatous splenic calcifications. No splenic masses. Adrenals/Urinary Tract: Normal adrenals. Simple bilateral renal cysts, largest 5.1 cm in the medial lower right kidney, for which no follow-up imaging is recommended. No hydronephrosis. Normal bladder. Stomach/Bowel: Normal non-distended stomach. Normal caliber small bowel with no small bowel wall thickening. Normal appendix. Marked sigmoid diverticulosis with no acute large bowel wall thickening or acute pericolonic fat stranding. Vascular/Lymphatic: Atherosclerotic nonaneurysmal abdominal aorta. Patent portal,  splenic, hepatic and renal veins. No pathologically enlarged lymph nodes in the abdomen or pelvis. Reproductive: Mild prostatomegaly. Other: No pneumoperitoneum, ascites or focal fluid collection. Musculoskeletal: No aggressive appearing focal osseous lesions. Minimal thoracic spondylosis. IMPRESSION: 1. Interval growth of irregular solid 0.8 cm posterior left upper lobe pulmonary nodule, suspicious for pulmonary metastasis versus metachronous primary bronchogenic carcinoma. 2. Stable subsolid 2.4 cm posterior left upper lobe pulmonary nodule with 0.5 cm solid component, abutting the left major fissure, cannot exclude indolent adenocarcinoma. 3. No evidence of metastatic disease in the abdomen or pelvis. 4. Chronic findings include: Marked sigmoid diverticulosis without evidence of acute diverticulitis. Mild prostatomegaly. One vessel coronary atherosclerosis. Aortic Atherosclerosis (ICD10-I70.0) and Emphysema (ICD10-J43.9). Electronically Signed   By: JIlona SorrelM.D.   On: 11/19/2021 17:21     ASSESSMENT/PLAN:  This is a very pleasant 71year old Caucasian male with metastatic lung cancer  initially diagnosed as stage IIb (T1b, N1, M0) non-small cell lung cancer, adenocarcinoma.  He is status post a right upper lobe lobectomy with lymph node dissection under the care of Dr. Roxan Hockey.  This was performed on June 17, 2019.  He has no actionable mutations.  His PD-L1 expression is 90%. He had evidence for metastatic disease in November 2021 with small right pleural effusion as well as multiple right pleural implants.    The patient completed 3 of the 4 planned adjuvant chemotherapy cycles with cisplatin 75 mg per metered square and Alimta 500 mg/m. This was discontinued after cycle #3 due to intolerance.   When the patient was found to have evidence for disease recurrence, the patient was started on immunotherapy with Libtayo (Cempilimab) 350 mg IV every 3 weeks status post 31 cycles. He is tolerating this  well.   The patient had a restaging CT scan a few months ago which showed a slowly enlarging subsolid nodule which we are monitoring closely.  Dr. Julien Nordmann mention that if this continues to enlarge we would consider SBRT.  The patient recently had a restaging CT scan performed.  Dr. Julien Nordmann personally and independently reviewed the scan and discussed the results with the patient today.  The scan was stable except the left upper lobe nodule continued to slightly enlarge. We will refer him to radiation oncology.  Dr. Julien Nordmann recommends that he proceed with cycle #32 today scheduled.  We will see him back for follow-up visit in 3 weeks for evaluation repeat blood work before starting cycle #33.  His BS on his labs show glucose of 351. He checked it in the exam room and it was 329. It is down trending. He will continue to monitor and take his insulin as prescribed.   The patient was advised to call immediately if he has any concerning symptoms in the interval. The patient voices understanding of current disease status and treatment options and is in agreement with the current care plan. All questions were answered. The patient knows to call the clinic with any problems, questions or concerns. We can certainly see the patient much sooner if necessary   Orders Placed This Encounter  Procedures   CBC with Differential ([facility] Only)    Standing Status:   Future    Standing Expiration Date:   11/21/2022   CMP ([facility] only)    Standing Status:   Future    Standing Expiration Date:   11/21/2022   T4    Standing Status:   Future    Standing Expiration Date:   11/21/2022   TSH    Standing Status:   Future    Standing Expiration Date:   11/21/2022   CBC with Differential ([facility] Only)    Standing Status:   Future    Standing Expiration Date:   12/12/2022   CMP ([facility] only)    Standing Status:   Future    Standing Expiration Date:   12/12/2022   T4    Standing Status:    Future    Standing Expiration Date:   12/12/2022   TSH    Standing Status:   Future    Standing Expiration Date:   12/12/2022   CBC with Differential ([facility] Only)    Standing Status:   Future    Standing Expiration Date:   01/02/2023   CMP ([facility] only)    Standing Status:   Future    Standing Expiration Date:  01/02/2023   T4    Standing Status:   Future    Standing Expiration Date:   01/02/2023   TSH    Standing Status:   Future    Standing Expiration Date:   01/02/2023   CBC with Differential ([facility] Only)    Standing Status:   Future    Standing Expiration Date:   01/23/2023   CMP ([facility] only)    Standing Status:   Future    Standing Expiration Date:   01/23/2023   T4    Standing Status:   Future    Standing Expiration Date:   01/23/2023   TSH    Standing Status:   Future    Standing Expiration Date:   01/23/2023   Ambulatory referral to Radiation Oncology    Referral Priority:   Routine    Referral Type:   Consultation    Referral Reason:   Specialty Services Required    Requested Specialty:   Radiation Oncology    Number of Visits Requested:   [place], PA-C 11/20/21  ADDENDUM: Hematology/Oncology Attending: I had a face-to-face in counter with the patient today.  I reviewed his lab, records and scan and recommended his care plan.  This is a very pleasant 72 years old white male diagnosed with metastatic non-small cell lung cancer initially diagnosed as a stage IIb status post right upper lobectomy with lymph node dissection in June 2021 with no actionable mutations and PD-L1 expression of 90%.  After the surgery he was treated with adjuvant systemic chemotherapy with cisplatin and Alimta but unfortunately has evidence for disease recurrence in the right pleural space in November 2021 with malignant right pleural effusion.  The patient started treatment with immunotherapy with Libtayo (Cempilimab) 350 Mg IV every 3 weeks  status post 31 cycles.  He has been tolerating this treatment well except for the immunotherapy mediated type 1 diabetes mellitus and he is currently followed by endocrinology and on treatment with insulin. He had repeat CT scan of the chest, abdomen and pelvis performed recently.  I personally and independently reviewed the scan images and discussed the result with the patient today. His scan showed no evidence for disease progression except for enlarging left upper lobe nodule suspicious for synchronous malignancy. I recommended for the patient to continue his current treatment with Libtayo (Cempilimab) and he will proceed with cycle #32 today. For the enlarging left upper lobe lung nodule, will refer the patient to radiation oncology for consideration of SBRT to this lesion. The patient will come back for follow-up visit in 3 weeks for evaluation before the next cycle of his treatment. He was advised to call immediately if he has any other concerning symptoms in the interval. The patient is in agreement with the current plan. The total time spent in the appointment was 30 minutes. Disclaimer: This note was dictated with voice recognition software. Similar sounding words can inadvertently be transcribed and may be missed upon review. Eilleen Kempf, MD

## 2021-11-19 ENCOUNTER — Encounter: Payer: Self-pay | Admitting: Internal Medicine

## 2021-11-20 ENCOUNTER — Inpatient Hospital Stay (HOSPITAL_BASED_OUTPATIENT_CLINIC_OR_DEPARTMENT_OTHER): Payer: Medicare Other | Admitting: Physician Assistant

## 2021-11-20 ENCOUNTER — Telehealth: Payer: Self-pay | Admitting: Radiation Oncology

## 2021-11-20 ENCOUNTER — Inpatient Hospital Stay: Payer: Medicare Other | Attending: Internal Medicine

## 2021-11-20 ENCOUNTER — Inpatient Hospital Stay: Payer: Medicare Other

## 2021-11-20 VITALS — BP 121/82 | HR 75 | Temp 97.9°F | Resp 18 | Ht 66.0 in | Wt 136.9 lb

## 2021-11-20 DIAGNOSIS — R911 Solitary pulmonary nodule: Secondary | ICD-10-CM | POA: Diagnosis not present

## 2021-11-20 DIAGNOSIS — Z79899 Other long term (current) drug therapy: Secondary | ICD-10-CM | POA: Insufficient documentation

## 2021-11-20 DIAGNOSIS — Z5112 Encounter for antineoplastic immunotherapy: Secondary | ICD-10-CM | POA: Diagnosis not present

## 2021-11-20 DIAGNOSIS — E109 Type 1 diabetes mellitus without complications: Secondary | ICD-10-CM | POA: Insufficient documentation

## 2021-11-20 DIAGNOSIS — J449 Chronic obstructive pulmonary disease, unspecified: Secondary | ICD-10-CM | POA: Insufficient documentation

## 2021-11-20 DIAGNOSIS — Z5111 Encounter for antineoplastic chemotherapy: Secondary | ICD-10-CM

## 2021-11-20 DIAGNOSIS — C3491 Malignant neoplasm of unspecified part of right bronchus or lung: Secondary | ICD-10-CM

## 2021-11-20 DIAGNOSIS — C3411 Malignant neoplasm of upper lobe, right bronchus or lung: Secondary | ICD-10-CM | POA: Insufficient documentation

## 2021-11-20 DIAGNOSIS — Z902 Acquired absence of lung [part of]: Secondary | ICD-10-CM | POA: Insufficient documentation

## 2021-11-20 DIAGNOSIS — Z794 Long term (current) use of insulin: Secondary | ICD-10-CM | POA: Diagnosis not present

## 2021-11-20 DIAGNOSIS — Z7951 Long term (current) use of inhaled steroids: Secondary | ICD-10-CM | POA: Diagnosis not present

## 2021-11-20 DIAGNOSIS — R5383 Other fatigue: Secondary | ICD-10-CM

## 2021-11-20 LAB — CBC WITH DIFFERENTIAL (CANCER CENTER ONLY)
Abs Immature Granulocytes: 0.02 10*3/uL (ref 0.00–0.07)
Basophils Absolute: 0.1 10*3/uL (ref 0.0–0.1)
Basophils Relative: 1 %
Eosinophils Absolute: 0.2 10*3/uL (ref 0.0–0.5)
Eosinophils Relative: 3 %
HCT: 40.2 % (ref 39.0–52.0)
Hemoglobin: 14.6 g/dL (ref 13.0–17.0)
Immature Granulocytes: 0 %
Lymphocytes Relative: 17 %
Lymphs Abs: 1.1 10*3/uL (ref 0.7–4.0)
MCH: 31.7 pg (ref 26.0–34.0)
MCHC: 36.3 g/dL — ABNORMAL HIGH (ref 30.0–36.0)
MCV: 87.4 fL (ref 80.0–100.0)
Monocytes Absolute: 0.4 10*3/uL (ref 0.1–1.0)
Monocytes Relative: 6 %
Neutro Abs: 5 10*3/uL (ref 1.7–7.7)
Neutrophils Relative %: 73 %
Platelet Count: 179 10*3/uL (ref 150–400)
RBC: 4.6 MIL/uL (ref 4.22–5.81)
RDW: 12.8 % (ref 11.5–15.5)
WBC Count: 6.9 10*3/uL (ref 4.0–10.5)
nRBC: 0 % (ref 0.0–0.2)

## 2021-11-20 LAB — CMP (CANCER CENTER ONLY)
ALT: 13 U/L (ref 0–44)
AST: 15 U/L (ref 15–41)
Albumin: 4 g/dL (ref 3.5–5.0)
Alkaline Phosphatase: 41 U/L (ref 38–126)
Anion gap: 4 — ABNORMAL LOW (ref 5–15)
BUN: 22 mg/dL (ref 8–23)
CO2: 29 mmol/L (ref 22–32)
Calcium: 8.9 mg/dL (ref 8.9–10.3)
Chloride: 99 mmol/L (ref 98–111)
Creatinine: 1.24 mg/dL (ref 0.61–1.24)
GFR, Estimated: >60 mL/min (ref >60)
Glucose, Bld: 351 mg/dL — ABNORMAL HIGH (ref 70–99)
Potassium: 4.2 mmol/L (ref 3.5–5.1)
Sodium: 132 mmol/L — ABNORMAL LOW (ref 135–145)
Total Bilirubin: 0.6 mg/dL (ref 0.3–1.2)
Total Protein: 6 g/dL — ABNORMAL LOW (ref 6.5–8.1)

## 2021-11-20 LAB — TSH: TSH: 1.774 u[IU]/mL (ref 0.350–4.500)

## 2021-11-20 MED ORDER — SODIUM CHLORIDE 0.9% FLUSH
10.0000 mL | INTRAVENOUS | Status: DC | PRN
  Administered 2021-11-20: 10 mL

## 2021-11-20 MED ORDER — SODIUM CHLORIDE 0.9 % IV SOLN: Freq: Once | INTRAVENOUS | Status: AC

## 2021-11-20 MED ORDER — HEPARIN SOD (PORK) LOCK FLUSH 100 UNIT/ML IV SOLN
500.0000 [IU] | Freq: Once | INTRAVENOUS | Status: AC | PRN
  Administered 2021-11-20: 500 [IU]

## 2021-11-20 MED ORDER — SODIUM CHLORIDE 0.9 % IV SOLN
350.0000 mg | Freq: Once | INTRAVENOUS | Status: AC
  Administered 2021-11-20: 350 mg via INTRAVENOUS
  Filled 2021-11-20: qty 7

## 2021-11-20 NOTE — Patient Instructions (Signed)
[facility]  Discharge Instructions: Thank you for choosing [facility] to provide your oncology and hematology care.   If you have a lab appointment with the [facility], please go directly to the [facility] and check in at the registration area.   Wear comfortable clothing and clothing appropriate for easy access to any Portacath or PICC line.   We strive to give you quality time with your provider. You may need to reschedule your appointment if you arrive late (15 or more minutes).  Arriving late affects you and other patients whose appointments are after yours.  Also, if you miss three or more appointments without notifying the office, you may be dismissed from the clinic at the provider's discretion.      For prescription refill requests, have your pharmacy contact our office and allow 72 hours for refills to be completed.    Today you received the following chemotherapy and/or immunotherapy agents libtayo      To help prevent nausea and vomiting after your treatment, we encourage you to take your nausea medication as directed.  BELOW ARE SYMPTOMS THAT SHOULD BE REPORTED IMMEDIATELY: *FEVER GREATER THAN 100.4 F (38 C) OR HIGHER *CHILLS OR SWEATING *NAUSEA AND VOMITING THAT IS NOT CONTROLLED WITH YOUR NAUSEA MEDICATION *UNUSUAL SHORTNESS OF BREATH *UNUSUAL BRUISING OR BLEEDING *URINARY PROBLEMS (pain or burning when urinating, or frequent urination) *BOWEL PROBLEMS (unusual diarrhea, constipation, pain near the anus) TENDERNESS IN MOUTH AND THROAT WITH OR WITHOUT PRESENCE OF ULCERS (sore throat, sores in mouth, or a toothache) UNUSUAL RASH, SWELLING OR PAIN  UNUSUAL VAGINAL DISCHARGE OR ITCHING   Items with * indicate a potential emergency and should be followed up as soon as possible or go to the Emergency Department if any problems should occur.  Please show the CHEMOTHERAPY ALERT CARD or IMMUNOTHERAPY ALERT CARD at check-in to the  Emergency Department and triage nurse.  Should you have questions after your visit or need to cancel or reschedule your appointment, please contact [facility]  Dept: [phone]  and follow the prompts.  Office hours are 8:00 a.m. to 4:30 p.m. Monday - Friday. Please note that voicemails left after 4:00 p.m. may not be returned until the following business day.  We are closed weekends and major holidays. You have access to a nurse at all times for urgent questions. Please call the main number to the clinic Dept: [phone] and follow the prompts.   For any non-urgent questions, you may also contact your provider using MyChart. We now offer e-Visits for anyone 6 and older to request care online for non-urgent symptoms. For details visit [email or web address].   Also download the MyChart app! Go to the app store, search "MyChart", open the app, select [facility], and log in with your MyChart username and password.  Masks are optional in the cancer centers. If you would like for your care team to wear a mask while they are taking care of you, please let them know. You may have one support person who is at least 70 years old accompany you for your appointments.

## 2021-11-20 NOTE — Telephone Encounter (Signed)
11/7 @ 1:35 pm Left voicemail for patient to call our office to be schedule for consult.

## 2021-11-20 NOTE — Progress Notes (Signed)
Thoracic Location of Tumor / Histology: [facility] Lung  Patient presented for restaging scans for Lung Cancer.   Biopsies of   Tobacco/Marijuana/Snuff/ETOH use: Former Smoker, quit in 2011.  He is currently Vaping daily.   Past/Anticipated interventions by cardiothoracic surgery, if any:  Dr. Roxan Hockey -Right Upper Lobe Lobectomy with lymph node dissection 06/17/2019.   Past/Anticipated interventions by medical oncology, if any:  Dr. Julien Nordmann 11/20/2021 - The patient completed 3 of the 4 planned adjuvant chemotherapy cycles with cisplatin 75 mg per metered square and Alimta 500 mg/m. This was discontinued after cycle #3 due to intolerance. -When the patient was found to have evidence for disease recurrence, the patient was started on immunotherapy with Libtayo (Cempilimab) 350 mg IV every 3 weeks status post 31 cycles. He is tolerating this well. -The patient recently had a restaging CT scan performed.  Dr. Julien Nordmann personally and independently reviewed the scan and discussed the results with the patient today.  The scan was stable except the left upper lobe nodule continued to slightly enlarge. We will refer him to radiation oncology.  Dr. Julien Nordmann recommends that he proceed with cycle #32 today scheduled.    Signs/Symptoms Weight changes, if any: He has lost about 20 pounds in the last couple of months.  Lately his weight has been stable. Respiratory complaints, if any: He reports SOB constantly, he has emphysema at baseline. Hemoptysis, if any: He reports occasional productive cough with clear to yellow phlegm.  He states when he uses his inhaler as directed his cough is less. Pain issues, if any:  2/10 pain to his right chest.  SAFETY ISSUES: Prior radiation? No Pacemaker/ICD? No  Possible current pregnancy? N/a Is the patient on methotrexate? No  Current Complaints / other details:   Right Chest Port.

## 2021-11-21 ENCOUNTER — Ambulatory Visit
Admission: RE | Admit: 2021-11-21 | Discharge: 2021-11-21 | Disposition: A | Discharge status: Home / Self Care | Payer: Medicare Other | Source: Ambulatory Visit | Attending: Radiation Oncology | Admitting: Radiation Oncology

## 2021-11-21 ENCOUNTER — Encounter: Payer: Self-pay | Admitting: Radiation Oncology

## 2021-11-21 VITALS — BP 121/67 | HR 71 | Temp 97.6°F | Resp 18 | Ht 66.0 in | Wt 136.4 lb

## 2021-11-21 DIAGNOSIS — Z794 Long term (current) use of insulin: Secondary | ICD-10-CM | POA: Insufficient documentation

## 2021-11-21 DIAGNOSIS — M47814 Spondylosis without myelopathy or radiculopathy, thoracic region: Secondary | ICD-10-CM | POA: Insufficient documentation

## 2021-11-21 DIAGNOSIS — N400 Enlarged prostate without lower urinary tract symptoms: Secondary | ICD-10-CM | POA: Insufficient documentation

## 2021-11-21 DIAGNOSIS — C3412 Malignant neoplasm of upper lobe, left bronchus or lung: Secondary | ICD-10-CM

## 2021-11-21 DIAGNOSIS — C3491 Malignant neoplasm of unspecified part of right bronchus or lung: Secondary | ICD-10-CM

## 2021-11-21 DIAGNOSIS — K219 Gastro-esophageal reflux disease without esophagitis: Secondary | ICD-10-CM | POA: Insufficient documentation

## 2021-11-21 DIAGNOSIS — G2581 Restless legs syndrome: Secondary | ICD-10-CM | POA: Insufficient documentation

## 2021-11-21 DIAGNOSIS — E119 Type 2 diabetes mellitus without complications: Secondary | ICD-10-CM | POA: Diagnosis not present

## 2021-11-21 DIAGNOSIS — Z7951 Long term (current) use of inhaled steroids: Secondary | ICD-10-CM | POA: Insufficient documentation

## 2021-11-21 DIAGNOSIS — F17210 Nicotine dependence, cigarettes, uncomplicated: Secondary | ICD-10-CM | POA: Insufficient documentation

## 2021-11-21 DIAGNOSIS — J449 Chronic obstructive pulmonary disease, unspecified: Secondary | ICD-10-CM | POA: Insufficient documentation

## 2021-11-21 DIAGNOSIS — I10 Essential (primary) hypertension: Secondary | ICD-10-CM | POA: Diagnosis not present

## 2021-11-21 DIAGNOSIS — Z79899 Other long term (current) drug therapy: Secondary | ICD-10-CM | POA: Diagnosis not present

## 2021-11-21 DIAGNOSIS — Z87891 Personal history of nicotine dependence: Secondary | ICD-10-CM | POA: Diagnosis not present

## 2021-11-21 NOTE — Progress Notes (Signed)
Radiation Oncology         [phone] ________________________________  Name: Luis [name]        MRN: [id number]  Date of Service: 11/21/2021 DOB: 01-15-1951  OF:[name]  Luis Bears, MD     REFERRING PHYSICIAN: Curt Bears, MD   DIAGNOSIS: The primary encounter diagnosis was Recurrent adenocarcinoma of right lung [facility]). A diagnosis of Malignant neoplasm of upper lobe of left lung (HCC) was also pertinent to this visit.   HISTORY OF PRESENT ILLNESS: Luis [name] is a 71 y.o. male seen at the request of Dr. Julien Nordmann for a diagnosis of progressive metastatic Stage IIB, cT1bN1M0, NSCLC, poorly differentiated carcinoma consistent with adenocarcinoma of the right upper lobe.  The patient underwent right upper lobectomy with lymph node dissection in June 2021 and received adjuvant systemic therapy due to his nodal involvement. His chemo was given between 09/02/19-10/14/19 which was difficult for him to tolerate and then was discontinued. He was noted to have evidence of metastatic disease to the pleural space in November 2021 and he was switched to immunotherapy with Libtayo since 02/05/21.    He has had a subsolid nodule in the left upper lobe that has been monitored and initially picked up on prior to his surgery.  Recent imaging on 11/16/2021: Questionable change in the known nodule in his left upper lobe along the fissure that was described as subsolid measuring up to 2.4 cm, in retrospect it has measured between 2.1 and 2.4 cm as well with solid internal component measuring 5 mm.  In addition there is a separate left upper lobe nodule that currently measures 8 mm noted previously been 6 mm in August 2023.  Of note otherwise his CT does not show evidence of progressive disease for his right upper lobe or in his mediastinal lymph nodes.  These changes noted the patient is seen to consider stereotactic body radiotherapy.    PREVIOUS RADIATION THERAPY: No   PAST  MEDICAL HISTORY:  Past Medical History:  Diagnosis Date   Arthritis    through out body   Chronic bronchitis (HCC)    Concussion    COPD (chronic obstructive pulmonary disease) ([place])    Diabetes mellitus without complication (HCC)    Dyspnea    Emphysema lung (HCC)    Fatigue    Frozen shoulder    LEFT   GERD (gastroesophageal reflux disease)    Hard of hearing    Headache    alot of days, related to spine issues   Hypertension    Lumbar radiculopathy 2013   nscl ca 04/2019   Peyronie's disease    Prostatitis 2011   Dr. Gaynelle Arabian   RLS (restless legs syndrome)    Shingles 06/13/2018   shingles on back, finished with prednisone, stills has scabs and pain   Thigh pain    Bilateral Thigh   Thigh pain 10/2011   bilateral   Tobacco abuse 2010   still uses electronic cig/ emphysema, SOB easily   Vertigo    per wifes update   Wears partial dentures    upper       PAST SURGICAL HISTORY: Past Surgical History:  Procedure Laterality Date   CATARACT EXTRACTION W/PHACO Right 07/08/2018   Procedure: CATARACT EXTRACTION PHACO AND INTRAOCULAR LENS PLACEMENT ([place]) RIGHT;  Surgeon: Leandrew Koyanagi, MD;  Location: [place];  Service: Ophthalmology;  Laterality: Right;   CATARACT EXTRACTION W/PHACO Left 07/29/2018   Procedure: CATARACT EXTRACTION PHACO AND INTRAOCULAR LENS  PLACEMENT ([place]) LEFT TORIC LENS;  Surgeon: Leandrew Koyanagi, MD;  Location: [place];  Service: Ophthalmology;  Laterality: Left;   CHEST TUBE INSERTION Right 07/01/2019   Procedure: CHEST TUBE INSERTION;  Surgeon: [name], MD;  Location: [place];  Service: Thoracic;  Laterality: Right;   COLONOSCOPY     EYE SURGERY     INTERCOSTAL NERVE BLOCK Right 06/17/2019   Procedure: INTERCOSTAL NERVE BLOCK;  Surgeon: [name], MD;  Location: [place];  Service: Thoracic;  Laterality: Right;   INTERCOSTAL NERVE BLOCK Right 07/01/2019   Procedure: INTERCOSTAL NERVE BLOCK;   Surgeon: [name], MD;  Location: [place];  Service: Thoracic;  Laterality: Right;   IR IMAGING GUIDED PORT INSERTION  02/21/2020   TONSILLECTOMY     VIDEO ASSISTED THORACOSCOPY Right 07/01/2019   Procedure: VIDEO ASSISTED THORACOSCOPY - REMOVAL OF RIGHT MIDDLE LOBE BLEBS;  Surgeon: [name], MD;  Location: [place];  Service: Thoracic;  Laterality: Right;   VIDEO BRONCHOSCOPY WITH INSERTION OF INTERBRONCHIAL VALVE (IBV) Right 06/25/2019   Procedure: VIDEO BRONCHOSCOPY WITH INSERTION OF INTERBRONCHIAL VALVE (IBV); Size 7 IBV into Right Loer Lobe (RLL) Superior Segment, Size 9 IBV into RLL Basilar Segment;  Surgeon: [name], MD;  Location: [facility];  Service: Thoracic;  Laterality: Right;   VIDEO BRONCHOSCOPY WITH INSERTION OF INTERBRONCHIAL VALVE (IBV) N/A 08/06/2019   Procedure: VIDEO BRONCHOSCOPY WITH REMOVAL OF INTERBRONCHIAL VALVE (IBV);  Surgeon: [name], MD;  Location: [facility] OR;  Service: Thoracic;  Laterality: N/A;     FAMILY HISTORY:  Family History  Problem Relation Age of Onset   Diabetes Mother    Hyperlipidemia Father    Hypertension Father      SOCIAL HISTORY:  reports that he quit smoking about 12 years ago. His smoking use included cigarettes. He has a 80.00 pack-year smoking history. He has quit using smokeless tobacco. He reports current alcohol use of about 12.0 standard drinks of alcohol per week. He reports that he does not use drugs.  Patient is married and lives in [place] he is accompanied  by his wife today.   ALLERGIES: Ativan [lorazepam], Doxycycline, and Tramadol   MEDICATIONS:  Current Outpatient Medications  Medication Sig Dispense Refill   acetaminophen (TYLENOL) 500 MG tablet Take 1,000 mg by mouth at bedtime as needed for moderate pain.     albuterol (VENTOLIN HFA) 108 (90 Base) MCG/ACT inhaler INHALE 1-2 PUFFS BY ORAL INHALATION EVERY 4 HOURS FOR CHRONIC OBSTRUCTIVE LUNG DISEASE BE SURE TO WASH  MOUTHPIECE WITH WARM WATER ONCE A WEEK     amLODipine (NORVASC) 5 MG tablet TAKE ONE-HALF TABLET BY MOUTH EVERY DAY FOR BLOOD PRESSURE. NOTE NEW MEDICINE     b complex vitamins capsule Take 1 capsule by mouth daily.     Cholecalciferol (VITAMIN D) 125 MCG (5000 UT) CAPS Take 5,000 Units by mouth daily.     citalopram (CELEXA) 20 MG tablet Take 1 tablet by mouth daily.     Continuous Blood Gluc Receiver ([place]) DEVI by Does not apply route.     Continuous Blood Gluc Sensor (DEXCOM G7 SENSOR) MISC by Does not apply route.     fluticasone-salmeterol (ADVAIR) 250-50 MCG/ACT AEPB 1 puff     glucose blood (ACCU-CHEK GUIDE) test strip See admin instructions.     insulin aspart (NOVOLOG FLEXPEN) 100 UNIT/ML FlexPen Max daily 30 units 15 mL 11   insulin glargine (LANTUS SOLOSTAR) 100 UNIT/ML Solostar  [name] Inject 10 Units into the skin daily. (Patient taking differently: Inject 8 Units into the skin daily.) 15 mL 6   Insulin [name] Needle 32G X 4 MM MISC 1 Device by Does not apply route in the morning, at noon, in the evening, and at bedtime. 400 each 3   lidocaine-prilocaine (EMLA) cream Apply 1 Application topically as needed. Apply 1 tsp over port site at least 45 minutes prior to lab appointment.Do not rub in cream. Cover with plastic wrap. 30 g 5   losartan (COZAAR) 50 MG tablet TAKE ONE TABLET BY MOUTH EVERY DAY FOR HYPERTENSION     Multiple Vitamin (MULTIVITAMIN) tablet Take 1 tablet by mouth 2 (two) times a week.     vitamin B-12 (CYANOCOBALAMIN) 500 MCG tablet Take 500 mcg by mouth daily.     No current facility-administered medications for this encounter.     REVIEW OF SYSTEMS: On review of systems, the patient reports that he is doing well overall. He does have shortness of breath that is alleviated by rest or rescue inhaler. He continues to vape but is not interested in quitting. He denies any chest pain. He's had an occasionally productive cough without hemoptysis. He has had about  20 pounds of unintended weight changes in the last 3 months, and is also very tired. No other complaints are verbalized.      PHYSICAL EXAM:  Wt Readings from Last 3 Encounters:  11/21/21 136 lb 6.4 oz (61.9 kg)  11/20/21 136 lb 14.4 oz (62.1 kg)  10/31/21 136 lb 6 oz (61.9 kg)   Temp Readings from Last 3 Encounters:  11/21/21 97.6 F (36.4 C)  11/20/21 97.9 F (36.6 C) (Oral)  10/31/21 97.7 F (36.5 C) (Oral)   BP Readings from Last 3 Encounters:  11/21/21 121/67  11/20/21 121/82  10/31/21 129/77   Pulse Readings from Last 3 Encounters:  11/21/21 71  11/20/21 75  10/31/21 85   Pain Assessment Pain Score: 2 /10  In general this is a well appearing Caucasian male in no acute distress. He's alert and oriented x4 and appropriate throughout the examination. Cardiopulmonary assessment is negative for acute distress and he exhibits normal effort.     ECOG = 1  0 - Asymptomatic (Fully active, able to carry on all predisease activities without restriction)  1 - Symptomatic but completely ambulatory (Restricted in physically strenuous activity but ambulatory and able to carry out work of a light or sedentary nature. For example, light housework, office work)  2 - Symptomatic, <50% in bed during the day (Ambulatory and capable of all self care but unable to carry out any work activities. Up and about more than 50% of waking hours)  3 - Symptomatic, >50% in bed, but not bedbound (Capable of only limited self-care, confined to bed or chair 50% or more of waking hours)  4 - Bedbound (Completely disabled. Cannot carry on any self-care. Totally confined to bed or chair)  5 - Death   Luis [name] MM, Creech RH, Tormey DC, et al. [phone]). "Toxicity and response criteria of the [facility]". [place] Oncol. 5 (6): 649-55    LABORATORY DATA:  Lab Results  Component Value Date   WBC 6.9 11/20/2021   HGB 14.6 11/20/2021   HCT 40.2 11/20/2021   MCV 87.4  11/20/2021   PLT 179 11/20/2021   Lab Results  Component Value Date   NA 132 (L) 11/20/2021   K 4.2 11/20/2021   CL 99  11/20/2021   CO2 29 11/20/2021   Lab Results  Component Value Date   ALT 13 11/20/2021   AST 15 11/20/2021   ALKPHOS 41 11/20/2021   BILITOT 0.6 11/20/2021      RADIOGRAPHY: CT CHEST ABDOMEN PELVIS W CONTRAST  Result Date: 11/19/2021 CLINICAL DATA:  Non-small cell right upper lobe lung cancer diagnosed April 2021 status post right upper lobectomy, chemotherapy and ongoing immunotherapy. Restaging. Patient reports right lower quadrant abdominal pain for 2 weeks. * Tracking Code: BO * EXAM: CT CHEST, ABDOMEN, AND PELVIS WITH CONTRAST TECHNIQUE: Multidetector CT imaging of the chest, abdomen and pelvis was performed following the standard protocol during bolus administration of intravenous contrast. RADIATION DOSE REDUCTION: This exam was performed according to the departmental dose-optimization program which includes automated exposure control, adjustment of the mA and/or kV according to patient size and/or use of iterative reconstruction technique. CONTRAST:  40mL OMNIPAQUE IOHEXOL 300 MG/ML  SOLN COMPARISON:  08/27/2021 CT chest, abdomen and pelvis. FINDINGS: CT CHEST FINDINGS Cardiovascular: Normal heart size. No significant pericardial effusion/thickening. Left anterior descending coronary atherosclerosis. Right internal jugular Port-A-Cath terminates at the cavoatrial junction. Atherosclerotic nonaneurysmal thoracic aorta. Normal caliber pulmonary arteries. No central pulmonary emboli. Mediastinum/Nodes: No significant thyroid nodules. Unremarkable esophagus. No pathologically enlarged axillary, mediastinal or hilar lymph nodes. Lungs/Pleura: No pneumothorax. Status post right upper lobectomy. Trace basilar right pleural effusion is stable. No left pleural effusion. Severe centrilobular emphysema. Subsolid 2.4 x 1.4 cm posterior left upper lobe pulmonary nodule abutting the  major fissure was 0.5 cm solid component (series 3/image 42), previously 2.4 x 1.3 cm with 0.5 cm solid component using similar measurement technique, stable. Irregular solid 0.8 x 0.6 cm posterior left upper lobe pulmonary nodule (series 3/image 55), increased from 0.6 x 0.3 cm. No additional significant pulmonary nodules. Scattered suture lines and mild curvilinear parenchymal bands in the right lung, stable and compatible with nonspecific scarring. Musculoskeletal: No aggressive appearing focal osseous lesions. Mild thoracic spondylosis. CT ABDOMEN PELVIS FINDINGS Hepatobiliary: Normal liver size. Stable simple 1.3 cm left liver dome cyst. Subcentimeter hypodense posterior right liver lesion is too small to characterize and is unchanged, presumably benign. No new liver lesions. Normal gallbladder with no radiopaque cholelithiasis. No biliary ductal dilatation. Pancreas: Normal, with no mass or duct dilation. Spleen: Normal size spleen. Stable granulomatous splenic calcifications. No splenic masses. Adrenals/Urinary Tract: Normal adrenals. Simple bilateral renal cysts, largest 5.1 cm in the medial lower right kidney, for which no follow-up imaging is recommended. No hydronephrosis. Normal bladder. Stomach/Bowel: Normal non-distended stomach. Normal caliber small bowel with no small bowel wall thickening. Normal appendix. Marked sigmoid diverticulosis with no acute large bowel wall thickening or acute pericolonic fat stranding. Vascular/Lymphatic: Atherosclerotic nonaneurysmal abdominal aorta. Patent portal, splenic, hepatic and renal veins. No pathologically enlarged lymph nodes in the abdomen or pelvis. Reproductive: Mild prostatomegaly. Other: No pneumoperitoneum, ascites or focal fluid collection. Musculoskeletal: No aggressive appearing focal osseous lesions. Minimal thoracic spondylosis. IMPRESSION: 1. Interval growth of irregular solid 0.8 cm posterior left upper lobe pulmonary nodule, suspicious for  pulmonary metastasis versus metachronous primary bronchogenic carcinoma. 2. Stable subsolid 2.4 cm posterior left upper lobe pulmonary nodule with 0.5 cm solid component, abutting the left major fissure, cannot exclude indolent adenocarcinoma. 3. No evidence of metastatic disease in the abdomen or pelvis. 4. Chronic findings include: Marked sigmoid diverticulosis without evidence of acute diverticulitis. Mild prostatomegaly. One vessel coronary atherosclerosis. Aortic Atherosclerosis (ICD10-I70.0) and Emphysema (ICD10-J43.9). Electronically Signed   By: Janina Mayo.D.  On: 11/19/2021 17:21       IMPRESSION/PLAN: 1. Progressive metastatic Stage IIB, cT1bN1M0, NSCLC, poorly differentiated carcinoma consistent with adenocarcinoma of the right upper lobe now involving the left upper lobe versus synchronous stage I non-small cell lung cancer in the left upper lobe.  Dr. Lisbeth Renshaw discusses the findings and work-up to date including his interval imaging.  He discusses concerns for either metastatic versus new primary in the left upper lobe nodule currently measuring 8 mm.  He recommends consideration of stereotactic body radiotherapy in 3-5 fractions to this location.  While the subsolid nodule in retrospect has been classified as somewhat increased in size, the nodular component remains stable and Dr. Lisbeth Renshaw favors following this closely with subsequent surveillance rather than treating at this time.  The patient and his wife are also in agreement with this at the conclusion of our discussion.  We discussed the risk, benefits, short and long-term effects of radiotherapy as well as collagen sticks and delivery of treatment.  Written consent is obtained and a copy was provided to the patient and scanned to his chart as well.  He will be contacted by simulation to proceed with treatment planning. 2. Subsolid nodule in the left upper lobe.  As per #1.  We will follow-up with his subsequent scans following radiation to  the smaller left upper lobe nodule but would have a low threshold to consider stereotactic body radiotherapy to the site as well if it did progress in size.  In a visit lasting 60 minutes, greater than 50% of the time was spent face to face discussing the patient's condition, in preparation for the discussion, and coordinating the patient's care.   The above documentation reflects my direct findings during this shared patient visit. Please see the separate note by Dr. Lisbeth Renshaw on this date for the remainder of the patient's plan of care.    Carola Rhine, [facility]   **Disclaimer: This note was dictated with voice recognition software. Similar sounding words can inadvertently be transcribed and this note may contain transcription errors which may not have been corrected upon publication of note.**

## 2021-11-22 ENCOUNTER — Other Ambulatory Visit: Payer: Self-pay

## 2021-11-22 DIAGNOSIS — Z87891 Personal history of nicotine dependence: Secondary | ICD-10-CM | POA: Diagnosis not present

## 2021-11-22 DIAGNOSIS — C3412 Malignant neoplasm of upper lobe, left bronchus or lung: Secondary | ICD-10-CM | POA: Diagnosis not present

## 2021-11-26 ENCOUNTER — Telehealth: Payer: Self-pay | Admitting: Internal Medicine

## 2021-11-26 NOTE — Telephone Encounter (Signed)
Called patient regarding all upcoming appointments, left a voicemail.

## 2021-11-27 ENCOUNTER — Other Ambulatory Visit: Payer: Self-pay

## 2021-11-27 ENCOUNTER — Ambulatory Visit
Admission: RE | Admit: 2021-11-27 | Discharge: 2021-11-27 | Disposition: A | Discharge status: Home / Self Care | Payer: Medicare Other | Source: Ambulatory Visit | Attending: Radiation Oncology | Admitting: Radiation Oncology

## 2021-11-27 DIAGNOSIS — Z510 Encounter for antineoplastic radiation therapy: Secondary | ICD-10-CM | POA: Diagnosis not present

## 2021-11-27 DIAGNOSIS — Z87891 Personal history of nicotine dependence: Secondary | ICD-10-CM | POA: Diagnosis not present

## 2021-11-27 DIAGNOSIS — C3412 Malignant neoplasm of upper lobe, left bronchus or lung: Secondary | ICD-10-CM | POA: Insufficient documentation

## 2021-12-05 DIAGNOSIS — Z510 Encounter for antineoplastic radiation therapy: Secondary | ICD-10-CM | POA: Diagnosis not present

## 2021-12-05 DIAGNOSIS — Z87891 Personal history of nicotine dependence: Secondary | ICD-10-CM | POA: Diagnosis not present

## 2021-12-05 DIAGNOSIS — C3412 Malignant neoplasm of upper lobe, left bronchus or lung: Secondary | ICD-10-CM | POA: Diagnosis not present

## 2021-12-07 NOTE — Progress Notes (Signed)
[place] [address]  DIAGNOSIS: Metastatic non-small cell lung cancer initially diagnosed as stage IIB (T1b, N1, M0) non-small cell lung cancer, invasive poorly differentiated carcinoma diagnosed in June 2021 with disease recurrence in November 2021 Molecular studies are negative for EGFR mutation as well as ALK gene translocation.  PD-L1 expression was 50-100%.  This is based on the report from the [facility] trial. Molecular studies by [facility]: Showed no actionable mutations and PD-L1 expression was 90%  PRIOR THERAPY: 1) Status post right upper lobectomy with lymph node dissection on June 17, 2019 under the care of Dr. Roxan Hockey. 2) Adjuvant systemic chemotherapy with cisplatin 75 mg/M2 and Alimta 500 mg/M2 every 3 weeks.  First dose September 02, 2019. Status post 3 cycles.  His treatment was discontinued secondary to intolerance.  CURRENT THERAPY:   1) First-line treatment with immunotherapy with Libtayo (Cempilimab) 350 mg IV every 3 weeks status post 32 cycles. First dose on 01/24/20.  2) SBRT to the left upper lobe nodule under the care of Dr. Lisbeth Renshaw. Last dose expected on 12/18/21.    INTERVAL HISTORY: Luis [name] 71 y.o. male returns to the clinic today for a follow-up visit. The patient is feeling fairly well today without any concerning complaints. At his last appointment, he had continued enlargement of the left upper lobe nodule. Therefore, he is currently undergoing SBRT to this area under the care of Dr. Lisbeth Renshaw. His last radiation is expected on 12/18/21.   For systemic treatment, he is currently on immunotherapy with Libtayo.  He is tolerating this well except he did develop type 1 diabetes for which he is seeing endocrinology and taking insulin. He has a portable device that tells him his BS in real time. [name] He states it spikes after he eats but then downtrends.   The patient sometimes may  develop a mild itching without rash with his treatment for which he will use hydrocortisone cream if needed. He states it is manageable and only bothers him if he itches himself while he sleeps.    Today, he states he is fairly well. He reports his baseline dyspnea exertion secondary to his COPD.  He sometimes has occasional chest discomfort along his prior surgical site in the right chest wall.  Denies any cough. Denies hemoptysis.  Denies any nausea, vomiting, or constipation. He has chronic intermittent diarrhea, which is stable. He cannot take imodium because even a small amount causes him to have severe constipation. The patient is here today for evaluation before undergoing cycle #33        MEDICAL HISTORY: Past Medical History:  Diagnosis Date   Arthritis    through out body   Chronic bronchitis (HCC)    Concussion    COPD (chronic obstructive pulmonary disease) ([facility])    Diabetes mellitus without complication (HCC)    Dyspnea    Emphysema lung (HCC)    Fatigue    Frozen shoulder    LEFT   GERD (gastroesophageal reflux disease)    Hard of hearing    Headache    alot of days, related to spine issues   Hypertension    Lumbar radiculopathy 2013   nscl ca 04/2019   Peyronie's disease    Prostatitis 2011   Dr. Gaynelle Arabian   RLS (restless legs syndrome)    Shingles 06/13/2018   shingles on back, finished with prednisone, stills has scabs and pain   Thigh  pain    Bilateral Thigh   Thigh pain 10/2011   bilateral   Tobacco abuse 2010   still uses electronic cig/ emphysema, SOB easily   Vertigo    per wifes update   Wears partial dentures    upper    ALLERGIES:  is allergic to ativan [lorazepam], doxycycline, and tramadol.  MEDICATIONS:  Current Outpatient Medications  Medication Sig Dispense Refill   acetaminophen (TYLENOL) 500 MG tablet Take 1,000 mg by mouth at bedtime as needed for moderate pain.     albuterol (VENTOLIN HFA) 108 (90 Base) MCG/ACT inhaler INHALE 1-2  PUFFS BY ORAL INHALATION EVERY 4 HOURS FOR CHRONIC OBSTRUCTIVE LUNG DISEASE BE SURE TO WASH MOUTHPIECE WITH WARM WATER ONCE A WEEK     amLODipine (NORVASC) 5 MG tablet TAKE ONE-HALF TABLET BY MOUTH EVERY DAY FOR BLOOD PRESSURE. NOTE NEW MEDICINE     b complex vitamins capsule Take 1 capsule by mouth daily.     Cholecalciferol (VITAMIN D) 125 MCG (5000 UT) CAPS Take 5,000 Units by mouth daily.     citalopram (CELEXA) 20 MG tablet Take 1 tablet by mouth daily.     Continuous Blood Gluc Receiver ([place]) DEVI by Does not apply route.     Continuous Blood Gluc Sensor (DEXCOM G7 SENSOR) MISC by Does not apply route.     fluticasone-salmeterol (ADVAIR) 250-50 MCG/ACT AEPB 1 puff     glucose blood (ACCU-CHEK GUIDE) test strip See admin instructions.     insulin aspart (NOVOLOG FLEXPEN) 100 UNIT/ML FlexPen Max daily 30 units 15 mL 11   insulin glargine (LANTUS SOLOSTAR) 100 UNIT/ML Solostar Pen Inject 10 Units into the skin daily. (Patient taking differently: Inject 8 Units into the skin daily.) 15 mL 6   Insulin Pen Needle 32G X 4 MM MISC 1 Device by Does not apply route in the morning, at noon, in the evening, and at bedtime. 400 each 3   lidocaine-prilocaine (EMLA) cream Apply 1 Application topically as needed. Apply 1 tsp over port site at least 45 minutes prior to lab appointment.Do not rub in cream. Cover with plastic wrap. 30 g 5   losartan (COZAAR) 50 MG tablet TAKE ONE TABLET BY MOUTH EVERY DAY FOR HYPERTENSION     Multiple Vitamin (MULTIVITAMIN) tablet Take 1 tablet by mouth 2 (two) times a week.     vitamin B-12 (CYANOCOBALAMIN) 500 MCG tablet Take 500 mcg by mouth daily.     No current facility-administered medications for this visit.    SURGICAL HISTORY:  Past Surgical History:  Procedure Laterality Date   CATARACT EXTRACTION W/PHACO Right 07/08/2018   Procedure: CATARACT EXTRACTION PHACO AND INTRAOCULAR LENS PLACEMENT ([facility]) RIGHT;  Surgeon: Leandrew Koyanagi, MD;   Location: [facility];  Service: Ophthalmology;  Laterality: Right;   CATARACT EXTRACTION W/PHACO Left 07/29/2018   Procedure: CATARACT EXTRACTION PHACO AND INTRAOCULAR LENS PLACEMENT ([facility]) LEFT TORIC LENS;  Surgeon: Leandrew Koyanagi, MD;  Location: [facility];  Service: Ophthalmology;  Laterality: Left;   CHEST TUBE INSERTION Right 07/01/2019   Procedure: CHEST TUBE INSERTION;  Surgeon: [name], MD;  Location: [facility];  Service: Thoracic;  Laterality: Right;   COLONOSCOPY     EYE SURGERY     INTERCOSTAL NERVE BLOCK Right 06/17/2019   Procedure: INTERCOSTAL NERVE BLOCK;  Surgeon: [name], MD;  Location: [facility];  Service: Thoracic;  Laterality: Right;   INTERCOSTAL NERVE BLOCK Right 07/01/2019   Procedure: INTERCOSTAL NERVE BLOCK;  Surgeon:  [name], MD;  Location: [facility];  Service: Thoracic;  Laterality: Right;   IR IMAGING GUIDED PORT INSERTION  02/21/2020   TONSILLECTOMY     VIDEO ASSISTED THORACOSCOPY Right 07/01/2019   Procedure: VIDEO ASSISTED THORACOSCOPY - REMOVAL OF RIGHT MIDDLE LOBE BLEBS;  Surgeon: [name], MD;  Location: [facility];  Service: Thoracic;  Laterality: Right;   VIDEO BRONCHOSCOPY WITH INSERTION OF INTERBRONCHIAL VALVE (IBV) Right 06/25/2019   Procedure: VIDEO BRONCHOSCOPY WITH INSERTION OF INTERBRONCHIAL VALVE (IBV); Size 7 IBV into Right Loer Lobe (RLL) Superior Segment, Size 9 IBV into RLL Basilar Segment;  Surgeon: [name], MD;  Location: [facility];  Service: Thoracic;  Laterality: Right;   VIDEO BRONCHOSCOPY WITH INSERTION OF INTERBRONCHIAL VALVE (IBV) N/A 08/06/2019   Procedure: VIDEO BRONCHOSCOPY WITH REMOVAL OF INTERBRONCHIAL VALVE (IBV);  Surgeon: [name], MD;  Location: [facility] OR;  Service: Thoracic;  Laterality: N/A;    REVIEW OF SYSTEMS:   Constitutional: Positive for few pound weight loss. Negative for appetite change, chills, fatigue, and fever.  HENT: Negative for mouth sores,  nosebleeds, sore throat and trouble swallowing.   Eyes: Negative for eye problems and icterus.  Respiratory: Positive for baseline dyspnea exertion.  Negative for cough, hemoptysis, and wheezing.   Cardiovascular: Negative for chest pain and leg swelling.  Gastrointestinal: Positive for baseline intermittent diarrhea.  No diarrhea at this time.  Negative for constipation, nausea and vomiting.  Genitourinary: Negative for bladder incontinence, difficulty urinating, dysuria, frequency and hematuria.   Musculoskeletal: Negative for back pain, gait problem, neck pain and neck stiffness.  Skin: Negative for itching and rash.  Neurological:  Negative for dizziness, headaches, extremity weakness, gait problem, light-headedness and seizures.  Hematological: Negative for adenopathy. Does not bruise/bleed easily.  Psychiatric/Behavioral: Negative for confusion, depression and sleep disturbance. The patient is not nervous/anxious.      PHYSICAL EXAMINATION:  Blood pressure (!) 122/90, pulse 84, temperature 98.4 F (36.9 C), temperature source Oral, resp. rate 17, weight 131 lb 11.2 oz (59.7 kg), SpO2 97 %.  ECOG PERFORMANCE STATUS: 1  Physical Exam  Constitutional: Oriented to person, place, and time and well-developed, well-nourished, and in no distress.  HENT:  Head: Normocephalic and atraumatic.  Mouth/Throat: Oropharynx is clear and moist. No oropharyngeal exudate.  Eyes: Conjunctivae are normal. Right eye exhibits no discharge. Left eye exhibits no discharge. No scleral icterus.  Neck: Normal range of motion. Neck supple.  Cardiovascular: Normal rate, regular rhythm, normal heart sounds and intact distal pulses.   Pulmonary/Chest: Effort normal. Positive for quiet breath sounds bilaterally. No respiratory distress. No wheezes. No rales.  Abdominal: Soft. Bowel sounds are normal. Exhibits no distension and no mass. There is no tenderness.  Musculoskeletal: Normal range of motion. Exhibits no  edema.  Lymphadenopathy:    No cervical adenopathy.  Neurological: Alert and oriented to person, place, and time. Exhibits normal muscle tone. Gait normal. Coordination normal.  Skin: Skin is warm and dry. No rash noted. Not diaphoretic. No erythema. No pallor.  Psychiatric: Mood, memory and judgment normal.  Vitals reviewed.    LABORATORY DATA: Lab Results  Component Value Date   WBC 8.1 12/12/2021   HGB 15.1 12/12/2021   HCT 42.2 12/12/2021   MCV 87.9 12/12/2021   PLT 214 12/12/2021      Chemistry      Component Value Date/Time   NA 132 (L) 11/20/2021 0925   K 4.2 11/20/2021 0925   CL 99 11/20/2021 0925  CO2 29 11/20/2021 0925   BUN 22 11/20/2021 0925   CREATININE 1.24 11/20/2021 0925      Component Value Date/Time   CALCIUM 8.9 11/20/2021 0925   ALKPHOS 41 11/20/2021 0925   AST 15 11/20/2021 0925   ALT 13 11/20/2021 0925   BILITOT 0.6 11/20/2021 0925       RADIOGRAPHIC STUDIES:  CT CHEST ABDOMEN PELVIS W CONTRAST  Result Date: 11/19/2021 CLINICAL DATA:  Non-small cell right upper lobe lung cancer diagnosed April 2021 status post right upper lobectomy, chemotherapy and ongoing immunotherapy. Restaging. Patient reports right lower quadrant abdominal pain for 2 weeks. * Tracking Code: BO * EXAM: CT CHEST, ABDOMEN, AND PELVIS WITH CONTRAST TECHNIQUE: Multidetector CT imaging of the chest, abdomen and pelvis was performed following the standard protocol during bolus administration of intravenous contrast. RADIATION DOSE REDUCTION: This exam was performed according to the departmental dose-optimization program which includes automated exposure control, adjustment of the mA and/or kV according to patient size and/or use of iterative reconstruction technique. CONTRAST:  20m OMNIPAQUE IOHEXOL 300 MG/ML  SOLN COMPARISON:  08/27/2021 CT chest, abdomen and pelvis. FINDINGS: CT CHEST FINDINGS Cardiovascular: Normal heart size. No significant pericardial effusion/thickening. Left  anterior descending coronary atherosclerosis. Right internal jugular Port-A-Cath terminates at the cavoatrial junction. Atherosclerotic nonaneurysmal thoracic aorta. Normal caliber pulmonary arteries. No central pulmonary emboli. Mediastinum/Nodes: No significant thyroid nodules. Unremarkable esophagus. No pathologically enlarged axillary, mediastinal or hilar lymph nodes. Lungs/Pleura: No pneumothorax. Status post right upper lobectomy. Trace basilar right pleural effusion is stable. No left pleural effusion. Severe centrilobular emphysema. Subsolid 2.4 x 1.4 cm posterior left upper lobe pulmonary nodule abutting the major fissure was 0.5 cm solid component (series 3/image 42), previously 2.4 x 1.3 cm with 0.5 cm solid component using similar measurement technique, stable. Irregular solid 0.8 x 0.6 cm posterior left upper lobe pulmonary nodule (series 3/image 55), increased from 0.6 x 0.3 cm. No additional significant pulmonary nodules. Scattered suture lines and mild curvilinear parenchymal bands in the right lung, stable and compatible with nonspecific scarring. Musculoskeletal: No aggressive appearing focal osseous lesions. Mild thoracic spondylosis. CT ABDOMEN PELVIS FINDINGS Hepatobiliary: Normal liver size. Stable simple 1.3 cm left liver dome cyst. Subcentimeter hypodense posterior right liver lesion is too small to characterize and is unchanged, presumably benign. No new liver lesions. Normal gallbladder with no radiopaque cholelithiasis. No biliary ductal dilatation. Pancreas: Normal, with no mass or duct dilation. Spleen: Normal size spleen. Stable granulomatous splenic calcifications. No splenic masses. Adrenals/Urinary Tract: Normal adrenals. Simple bilateral renal cysts, largest 5.1 cm in the medial lower right kidney, for which no follow-up imaging is recommended. No hydronephrosis. Normal bladder. Stomach/Bowel: Normal non-distended stomach. Normal caliber small bowel with no small bowel wall  thickening. Normal appendix. Marked sigmoid diverticulosis with no acute large bowel wall thickening or acute pericolonic fat stranding. Vascular/Lymphatic: Atherosclerotic nonaneurysmal abdominal aorta. Patent portal, splenic, hepatic and renal veins. No pathologically enlarged lymph nodes in the abdomen or pelvis. Reproductive: Mild prostatomegaly. Other: No pneumoperitoneum, ascites or focal fluid collection. Musculoskeletal: No aggressive appearing focal osseous lesions. Minimal thoracic spondylosis. IMPRESSION: 1. Interval growth of irregular solid 0.8 cm posterior left upper lobe pulmonary nodule, suspicious for pulmonary metastasis versus metachronous primary bronchogenic carcinoma. 2. Stable subsolid 2.4 cm posterior left upper lobe pulmonary nodule with 0.5 cm solid component, abutting the left major fissure, cannot exclude indolent adenocarcinoma. 3. No evidence of metastatic disease in the abdomen or pelvis. 4. Chronic findings include: Marked sigmoid diverticulosis without evidence of acute  diverticulitis. Mild prostatomegaly. One vessel coronary atherosclerosis. Aortic Atherosclerosis (ICD10-I70.0) and Emphysema (ICD10-J43.9). Electronically Signed   By: Ilona Sorrel M.D.   On: 11/19/2021 17:21     ASSESSMENT/PLAN:  This is a very pleasant 71 year old Caucasian male with metastatic lung cancer initially diagnosed as stage IIb (T1b, N1, M0) non-small cell lung cancer, adenocarcinoma.  He is status post a right upper lobe lobectomy with lymph node dissection under the care of Dr. Roxan Hockey.  This was performed on June 17, 2019.  He has no actionable mutations.  His PD-L1 expression is 90%. He had evidence for metastatic disease in November 2021 with small right pleural effusion as well as multiple right pleural implants.    The patient completed 3 of the 4 planned adjuvant chemotherapy cycles with cisplatin 75 mg per metered square and Alimta 500 mg/m. This was discontinued after cycle #3 due to  intolerance.   When the patient was found to have evidence for disease recurrence, the patient was started on immunotherapy with Libtayo (Cempilimab) 350 mg IV every 3 weeks status post 32 cycles. He is tolerating this well.   The patient had a restaging CT scan a few months ago which showed a slowly enlarging subsolid nodule which we are monitoring closely.  Dr. Julien Nordmann mention that if this continues to enlarge we would consider SBRT. This enlarged on his November 2023 imaging, and he is currently undergoing SBRT to this area under the care of Dr. Lisbeth Renshaw, his last dose is scheduled for 12/18/21.   Labs were reviewed, recommend he proceed with cycle #33 today as scheduled. I will see him back for a follow up visit in 3 weeks for evaluation before starting cycle #34.   His BS on his labs show glucose of 326. It is down trending and his blood sugar will bottom out if he takes additional insulin. He will continue to monitor and take his insulin as prescribed.   The patient was advised to call immediately if he has any concerning symptoms in the interval. The patient voices understanding of current disease status and treatment options and is in agreement with the current care plan. All questions were answered. The patient knows to call the clinic with any problems, questions or concerns. We can certainly see the patient much sooner if necessary           No orders of the defined types were placed in this encounter.    The total time spent in the appointment was 20-29 minutes  Sayed Apostol L Marcquis Ridlon, PA-C 12/12/21

## 2021-12-09 DIAGNOSIS — Z87891 Personal history of nicotine dependence: Secondary | ICD-10-CM | POA: Diagnosis not present

## 2021-12-09 DIAGNOSIS — C3412 Malignant neoplasm of upper lobe, left bronchus or lung: Secondary | ICD-10-CM | POA: Diagnosis not present

## 2021-12-11 ENCOUNTER — Other Ambulatory Visit: Payer: Self-pay

## 2021-12-11 ENCOUNTER — Ambulatory Visit
Admission: RE | Admit: 2021-12-11 | Discharge: 2021-12-11 | Disposition: A | Discharge status: Home / Self Care | Payer: Medicare Other | Source: Ambulatory Visit | Attending: Radiation Oncology | Admitting: Radiation Oncology

## 2021-12-11 DIAGNOSIS — Z510 Encounter for antineoplastic radiation therapy: Secondary | ICD-10-CM | POA: Diagnosis not present

## 2021-12-11 DIAGNOSIS — C3412 Malignant neoplasm of upper lobe, left bronchus or lung: Secondary | ICD-10-CM | POA: Diagnosis not present

## 2021-12-11 LAB — RAD ONC ARIA SESSION SUMMARY
Course Elapsed Days: 0
Plan Fractions Treated to Date: 1
Plan Prescribed Dose Per Fraction: 18 Gy
Plan Total Fractions Prescribed: 3
Plan Total Prescribed Dose: 54 Gy
Reference Point Dosage Given to Date: 18 Gy
Reference Point Session Dosage Given: 18 Gy
Session Number: 1

## 2021-12-12 ENCOUNTER — Other Ambulatory Visit: Payer: Medicare Other

## 2021-12-12 ENCOUNTER — Inpatient Hospital Stay (HOSPITAL_BASED_OUTPATIENT_CLINIC_OR_DEPARTMENT_OTHER): Payer: Medicare Other | Admitting: Physician Assistant

## 2021-12-12 ENCOUNTER — Ambulatory Visit: Payer: Medicare Other | Admitting: Internal Medicine

## 2021-12-12 ENCOUNTER — Ambulatory Visit: Payer: Medicare Other | Admitting: Radiation Oncology

## 2021-12-12 ENCOUNTER — Ambulatory Visit: Payer: Medicare Other | Admitting: Physician Assistant

## 2021-12-12 ENCOUNTER — Ambulatory Visit: Payer: Medicare Other

## 2021-12-12 ENCOUNTER — Inpatient Hospital Stay: Payer: Medicare Other

## 2021-12-12 VITALS — BP 122/90 | HR 84 | Temp 98.4°F | Resp 17 | Wt 131.7 lb

## 2021-12-12 DIAGNOSIS — C3491 Malignant neoplasm of unspecified part of right bronchus or lung: Secondary | ICD-10-CM

## 2021-12-12 DIAGNOSIS — C3411 Malignant neoplasm of upper lobe, right bronchus or lung: Secondary | ICD-10-CM | POA: Diagnosis not present

## 2021-12-12 DIAGNOSIS — E109 Type 1 diabetes mellitus without complications: Secondary | ICD-10-CM | POA: Diagnosis not present

## 2021-12-12 DIAGNOSIS — Z5112 Encounter for antineoplastic immunotherapy: Secondary | ICD-10-CM | POA: Diagnosis not present

## 2021-12-12 DIAGNOSIS — Z7951 Long term (current) use of inhaled steroids: Secondary | ICD-10-CM | POA: Diagnosis not present

## 2021-12-12 DIAGNOSIS — Z794 Long term (current) use of insulin: Secondary | ICD-10-CM | POA: Diagnosis not present

## 2021-12-12 DIAGNOSIS — Z5111 Encounter for antineoplastic chemotherapy: Secondary | ICD-10-CM

## 2021-12-12 DIAGNOSIS — J449 Chronic obstructive pulmonary disease, unspecified: Secondary | ICD-10-CM | POA: Diagnosis not present

## 2021-12-12 LAB — CMP (CANCER CENTER ONLY)
ALT: 16 U/L (ref 0–44)
AST: 17 U/L (ref 15–41)
Albumin: 3.8 g/dL (ref 3.5–5.0)
Alkaline Phosphatase: 47 U/L (ref 38–126)
Anion gap: 6 (ref 5–15)
BUN: 14 mg/dL (ref 8–23)
CO2: 28 mmol/L (ref 22–32)
Calcium: 9 mg/dL (ref 8.9–10.3)
Chloride: 101 mmol/L (ref 98–111)
Creatinine: 1.18 mg/dL (ref 0.61–1.24)
GFR, Estimated: >60 mL/min (ref >60)
Glucose, Bld: 326 mg/dL — ABNORMAL HIGH (ref 70–99)
Potassium: 4.3 mmol/L (ref 3.5–5.1)
Sodium: 135 mmol/L (ref 135–145)
Total Bilirubin: 0.6 mg/dL (ref 0.3–1.2)
Total Protein: 6.3 g/dL — ABNORMAL LOW (ref 6.5–8.1)

## 2021-12-12 LAB — CBC WITH DIFFERENTIAL (CANCER CENTER ONLY)
Abs Immature Granulocytes: 0.03 10*3/uL (ref 0.00–0.07)
Basophils Absolute: 0.1 10*3/uL (ref 0.0–0.1)
Basophils Relative: 1 %
Eosinophils Absolute: 0.3 10*3/uL (ref 0.0–0.5)
Eosinophils Relative: 4 %
HCT: 42.2 % (ref 39.0–52.0)
Hemoglobin: 15.1 g/dL (ref 13.0–17.0)
Immature Granulocytes: 0 %
Lymphocytes Relative: 18 %
Lymphs Abs: 1.4 10*3/uL (ref 0.7–4.0)
MCH: 31.5 pg (ref 26.0–34.0)
MCHC: 35.8 g/dL (ref 30.0–36.0)
MCV: 87.9 fL (ref 80.0–100.0)
Monocytes Absolute: 0.5 10*3/uL (ref 0.1–1.0)
Monocytes Relative: 6 %
Neutro Abs: 5.8 10*3/uL (ref 1.7–7.7)
Neutrophils Relative %: 71 %
Platelet Count: 214 10*3/uL (ref 150–400)
RBC: 4.8 MIL/uL (ref 4.22–5.81)
RDW: 12.8 % (ref 11.5–15.5)
WBC Count: 8.1 10*3/uL (ref 4.0–10.5)
nRBC: 0 % (ref 0.0–0.2)

## 2021-12-12 LAB — TSH: TSH: 0.953 u[IU]/mL (ref 0.350–4.500)

## 2021-12-12 MED ORDER — SODIUM CHLORIDE 0.9% FLUSH
10.0000 mL | INTRAVENOUS | Status: DC | PRN
  Administered 2021-12-12: 10 mL

## 2021-12-12 MED ORDER — SODIUM CHLORIDE 0.9 % IV SOLN: Freq: Once | INTRAVENOUS | Status: AC

## 2021-12-12 MED ORDER — SODIUM CHLORIDE 0.9 % IV SOLN
350.0000 mg | Freq: Once | INTRAVENOUS | Status: AC
  Administered 2021-12-12: 350 mg via INTRAVENOUS
  Filled 2021-12-12: qty 7

## 2021-12-12 MED ORDER — HEPARIN SOD (PORK) LOCK FLUSH 100 UNIT/ML IV SOLN
500.0000 [IU] | Freq: Once | INTRAVENOUS | Status: AC | PRN
  Administered 2021-12-12: 500 [IU]

## 2021-12-13 ENCOUNTER — Ambulatory Visit
Admission: RE | Admit: 2021-12-13 | Discharge: 2021-12-13 | Disposition: A | Discharge status: Home / Self Care | Payer: Medicare Other | Source: Ambulatory Visit | Attending: Radiation Oncology | Admitting: Radiation Oncology

## 2021-12-13 ENCOUNTER — Other Ambulatory Visit: Payer: Self-pay

## 2021-12-13 DIAGNOSIS — Z510 Encounter for antineoplastic radiation therapy: Secondary | ICD-10-CM | POA: Diagnosis not present

## 2021-12-13 DIAGNOSIS — C3412 Malignant neoplasm of upper lobe, left bronchus or lung: Secondary | ICD-10-CM | POA: Diagnosis not present

## 2021-12-13 LAB — RAD ONC ARIA SESSION SUMMARY
Course Elapsed Days: 2
Plan Fractions Treated to Date: 2
Plan Prescribed Dose Per Fraction: 18 Gy
Plan Total Fractions Prescribed: 3
Plan Total Prescribed Dose: 54 Gy
Reference Point Dosage Given to Date: 36 Gy
Reference Point Session Dosage Given: 18 Gy
Session Number: 2

## 2021-12-13 LAB — T4: T4, Total: 9.3 ug/dL (ref 4.5–12.0)

## 2021-12-18 ENCOUNTER — Ambulatory Visit
Admission: RE | Admit: 2021-12-18 | Discharge: 2021-12-18 | Disposition: A | Discharge status: Home / Self Care | Payer: Medicare Other | Source: Ambulatory Visit | Attending: Radiation Oncology | Admitting: Radiation Oncology

## 2021-12-18 ENCOUNTER — Other Ambulatory Visit: Payer: Self-pay

## 2021-12-18 DIAGNOSIS — Z510 Encounter for antineoplastic radiation therapy: Secondary | ICD-10-CM | POA: Diagnosis not present

## 2021-12-18 DIAGNOSIS — Z87891 Personal history of nicotine dependence: Secondary | ICD-10-CM | POA: Diagnosis not present

## 2021-12-18 DIAGNOSIS — C3412 Malignant neoplasm of upper lobe, left bronchus or lung: Secondary | ICD-10-CM | POA: Insufficient documentation

## 2021-12-18 LAB — RAD ONC ARIA SESSION SUMMARY
Course Elapsed Days: 7
Plan Fractions Treated to Date: 3
Plan Prescribed Dose Per Fraction: 18 Gy
Plan Total Fractions Prescribed: 3
Plan Total Prescribed Dose: 54 Gy
Reference Point Dosage Given to Date: 54 Gy
Reference Point Session Dosage Given: 18 Gy
Session Number: 3

## 2021-12-18 NOTE — Progress Notes (Signed)
  Radiation Oncology         [phone] ________________________________  Name: Luis [name] MRN: [id number]  Date: 12/18/2021  DOB: [birth year]  End of Treatment Note  Diagnosis:  Progressive metastatic Stage IIB, cT1bN1M0, NSCLC, poorly differentiated carcinoma consistent with adenocarcinoma of the right upper lobe now involving the left upper lobe versus synchronous stage I non-small cell lung cancer in the left upper lobe.    Indication for treatment:  Curative       Radiation treatment dates:  12/11/21-12/18/21  Site/dose:   The tumor in the LUL was treated with a course of stereotactic body radiation treatment. The patient received 54 Gy In 3 fractions at 18 G per fraction.  Narrative: The patient tolerated radiation treatment relatively well.   The patient did not have any signs of acute toxicity during treatment.  Plan: The patient will receive a call in about one month from the radiation oncology department. He will continue follow up with Dr. Julien Nordmann for surveillance imaging.     Carola Rhine, PAC

## 2021-12-21 ENCOUNTER — Other Ambulatory Visit: Payer: Self-pay

## 2021-12-28 NOTE — Progress Notes (Unsigned)
[place] [id number]  DIAGNOSIS: Metastatic non-small cell lung cancer initially diagnosed as stage IIB (T1b, N1, M0) non-small cell lung cancer, invasive poorly differentiated carcinoma diagnosed in June 2021 with disease recurrence in November 2021 Molecular studies are negative for EGFR mutation as well as ALK gene translocation.  PD-L1 expression was 50-100%.  This is based on the report from the [facility] trial. Molecular studies by [facility]: Showed no actionable mutations and PD-L1 expression was 90%  PRIOR THERAPY: 1) Status post right upper lobectomy with lymph node dissection on June 17, 2019 under the care of Dr. Roxan Hockey. 2) Adjuvant systemic chemotherapy with cisplatin 75 mg/M2 and Alimta 500 mg/M2 every 3 weeks.  First dose September 02, 2019. Status post 3 cycles.  His treatment was discontinued secondary to intolerance.  CURRENT THERAPY:  1) First-line treatment with immunotherapy with Libtayo (Cempilimab) 350 mg IV every 3 weeks status post 34 cycles. First dose on 01/24/20.  2) SBRT to the left upper lobe nodule under the care of Dr. Lisbeth Renshaw. Last dose expected on 12/18/21.    INTERVAL HISTORY: Luis [name] 71 y.o. male returns to the clinic today for a follow-up visit. The patient is feeling fairly well today without any concerning complaints. At his last appointment, he had continued enlargement of the left upper lobe nodule. Therefore, he recently completed SBRT to this area under the care of Dr. Lisbeth Renshaw. His last radiation on 12/18/21. He felt a little more tired and fatigued since that time. He is functional and can perform his ADLS. He also believes he may have a little bit more shortness of breath with exertion but states it is "not bad". He states radiation discussed with him these may be expected side effects.   For systemic treatment, he is currently on immunotherapy with Libtayo.  He is  tolerating this well except he did develop type 1 diabetes for which he is seeing endocrinology and taking insulin. He has a portable device that tells him his BS in real time. He states it spikes after he eats but then downtrends.    The patient sometimes may develop a mild itching without rash with his treatment for which he will use hydrocortisone cream if needed. He states it is manageable and only bothers him if he itches himself while he sleeps.   Today, he states he is fairly well. He sometimes has baseline occasional chest discomfort along his prior surgical site in the right chest wall.  Denies any cough. Denies hemoptysis.  Denies any nausea, vomiting, or constipation. He has chronic intermittent diarrhea, which is stable. He cannot take imodium because even a small amount causes him to have severe constipation. The patient is here today for evaluation before undergoing cycle #34.     MEDICAL HISTORY: Past Medical History:  Diagnosis Date   Arthritis    through out body   Chronic bronchitis (HCC)    Concussion    COPD (chronic obstructive pulmonary disease) (HCC)    Diabetes mellitus without complication (HCC)    Dyspnea    Emphysema lung (HCC)    Fatigue    Frozen shoulder    LEFT   GERD (gastroesophageal reflux disease)    Hard of hearing    Headache    alot of days, related to spine issues   Hypertension    Lumbar radiculopathy 2013   nscl ca 04/2019   Peyronie's disease  Prostatitis 2011   Dr. Gaynelle Arabian   RLS (restless legs syndrome)    Shingles 06/13/2018   shingles on back, finished with prednisone, stills has scabs and pain   Thigh pain    Bilateral Thigh   Thigh pain 10/2011   bilateral   Tobacco abuse 2010   still uses electronic cig/ emphysema, SOB easily   Vertigo    per wifes update   Wears partial dentures    upper    ALLERGIES:  is allergic to ativan [lorazepam], doxycycline, and tramadol.  MEDICATIONS:  Current Outpatient Medications   Medication Sig Dispense Refill   acetaminophen (TYLENOL) 500 MG tablet Take 1,000 mg by mouth at bedtime as needed for moderate pain.     albuterol (VENTOLIN HFA) 108 (90 Base) MCG/ACT inhaler INHALE 1-2 PUFFS BY ORAL INHALATION EVERY 4 HOURS FOR CHRONIC OBSTRUCTIVE LUNG DISEASE BE SURE TO WASH MOUTHPIECE WITH WARM WATER ONCE A WEEK     amLODipine (NORVASC) 5 MG tablet TAKE ONE-HALF TABLET BY MOUTH EVERY DAY FOR BLOOD PRESSURE. NOTE NEW MEDICINE     b complex vitamins capsule Take 1 capsule by mouth daily.     Cholecalciferol (VITAMIN D) 125 MCG (5000 UT) CAPS Take 5,000 Units by mouth daily.     citalopram (CELEXA) 20 MG tablet Take 1 tablet by mouth daily.     Continuous Blood Gluc Receiver ([place]) DEVI by Does not apply route.     Continuous Blood Gluc Sensor (DEXCOM G7 SENSOR) MISC by Does not apply route.     fluticasone-salmeterol (ADVAIR) 250-50 MCG/ACT AEPB 1 puff     glucose blood (ACCU-CHEK GUIDE) test strip See admin instructions.     insulin aspart (NOVOLOG FLEXPEN) 100 UNIT/ML FlexPen Max daily 30 units 15 mL 11   insulin glargine (LANTUS SOLOSTAR) 100 UNIT/ML Solostar Pen Inject 10 Units into the skin daily. (Patient taking differently: Inject 8 Units into the skin daily.) 15 mL 6   Insulin Pen Needle 32G X 4 MM MISC 1 Device by Does not apply route in the morning, at noon, in the evening, and at bedtime. 400 each 3   lidocaine-prilocaine (EMLA) cream Apply 1 Application topically as needed. Apply 1 tsp over port site at least 45 minutes prior to lab appointment.Do not rub in cream. Cover with plastic wrap. 30 g 5   losartan (COZAAR) 50 MG tablet TAKE ONE TABLET BY MOUTH EVERY DAY FOR HYPERTENSION     Multiple Vitamin (MULTIVITAMIN) tablet Take 1 tablet by mouth 2 (two) times a week.     vitamin B-12 (CYANOCOBALAMIN) 500 MCG tablet Take 500 mcg by mouth daily.     No current facility-administered medications for this visit.    SURGICAL HISTORY:  Past Surgical  History:  Procedure Laterality Date   CATARACT EXTRACTION W/PHACO Right 07/08/2018   Procedure: CATARACT EXTRACTION PHACO AND INTRAOCULAR LENS PLACEMENT ([facility]) RIGHT;  Surgeon: Leandrew Koyanagi, MD;  Location: [facility];  Service: Ophthalmology;  Laterality: Right;   CATARACT EXTRACTION W/PHACO Left 07/29/2018   Procedure: CATARACT EXTRACTION PHACO AND INTRAOCULAR LENS PLACEMENT ([facility]) LEFT TORIC LENS;  Surgeon: Leandrew Koyanagi, MD;  Location: [facility];  Service: Ophthalmology;  Laterality: Left;   CHEST TUBE INSERTION Right 07/01/2019   Procedure: CHEST TUBE INSERTION;  Surgeon: [name], MD;  Location: [facility];  Service: Thoracic;  Laterality: Right;   COLONOSCOPY     EYE SURGERY     INTERCOSTAL NERVE BLOCK Right 06/17/2019   Procedure: INTERCOSTAL  NERVE BLOCK;  Surgeon: [name], MD;  Location: [facility];  Service: Thoracic;  Laterality: Right;   INTERCOSTAL NERVE BLOCK Right 07/01/2019   Procedure: INTERCOSTAL NERVE BLOCK;  Surgeon: [name], MD;  Location: [facility];  Service: Thoracic;  Laterality: Right;   IR IMAGING GUIDED PORT INSERTION  02/21/2020   TONSILLECTOMY     VIDEO ASSISTED THORACOSCOPY Right 07/01/2019   Procedure: VIDEO ASSISTED THORACOSCOPY - REMOVAL OF RIGHT MIDDLE LOBE BLEBS;  Surgeon: [name], MD;  Location: [facility];  Service: Thoracic;  Laterality: Right;   VIDEO BRONCHOSCOPY WITH INSERTION OF INTERBRONCHIAL VALVE (IBV) Right 06/25/2019   Procedure: VIDEO BRONCHOSCOPY WITH INSERTION OF INTERBRONCHIAL VALVE (IBV); Size 7 IBV into Right Loer Lobe (RLL) Superior Segment, Size 9 IBV into RLL Basilar Segment;  Surgeon: [name], MD;  Location: [facility];  Service: Thoracic;  Laterality: Right;   VIDEO BRONCHOSCOPY WITH INSERTION OF INTERBRONCHIAL VALVE (IBV) N/A 08/06/2019   Procedure: VIDEO BRONCHOSCOPY WITH REMOVAL OF INTERBRONCHIAL VALVE (IBV);  Surgeon: [name], MD;  Location: [facility];   Service: Thoracic;  Laterality: N/A;    REVIEW OF SYSTEMS:   Review of Systems  Constitutional: Positive for fatigue. Negative for appetite change, chills, fatigue, fever and unexpected weight change.  HENT:   Negative for mouth sores, nosebleeds, sore throat and trouble swallowing.   Eyes: Negative for eye problems and icterus.  Respiratory: Positive for baseline dyspnea exertion.  Negative for cough, hemoptysis, and wheezing.   Cardiovascular: Negative for chest pain and leg swelling.  Gastrointestinal: Positive for baseline intermittent diarrhea.  No diarrhea at this time.  Negative for constipation, nausea and vomiting.  Genitourinary: Negative for bladder incontinence, difficulty urinating, dysuria, frequency and hematuria.   Musculoskeletal: Negative for back pain, gait problem, neck pain and neck stiffness.  Skin: Positive for intermittent itching and rash.  Neurological:  Negative for dizziness, headaches, extremity weakness, gait problem, light-headedness and seizures.  Hematological: Negative for adenopathy. Does not bruise/bleed easily.  Psychiatric/Behavioral: Negative for confusion, depression and sleep disturbance. The patient is not nervous/anxious.      PHYSICAL EXAMINATION:  Blood pressure 135/75, pulse 89, temperature 98.1 F (36.7 C), temperature source Temporal, resp. rate 15, weight 135 lb 14.4 oz (61.6 kg), SpO2 96 %.  ECOG PERFORMANCE STATUS: 1  Physical Exam  Constitutional: Oriented to person, place, and time and well-developed, well-nourished, and in no distress.  HENT:  Head: Normocephalic and atraumatic.  Mouth/Throat: Oropharynx is clear and moist. No oropharyngeal exudate.  Eyes: Conjunctivae are normal. Right eye exhibits no discharge. Left eye exhibits no discharge. No scleral icterus.  Neck: Normal range of motion. Neck supple.  Cardiovascular: Normal rate, regular rhythm, normal heart sounds and intact distal pulses.   Pulmonary/Chest: Effort normal.  Positive for quiet breath sounds bilaterally. No respiratory distress. No wheezes. No rales.  Abdominal: Soft. Bowel sounds are normal. Exhibits no distension and no mass. There is no tenderness.  Musculoskeletal: Normal range of motion. Exhibits no edema.  Lymphadenopathy:    No cervical adenopathy.  Neurological: Alert and oriented to person, place, and time. Exhibits normal muscle tone. Gait normal. Coordination normal.  Skin: Skin is warm and dry. Mild rash on eyebrows. Positive for scratches from his dog on left forearm. Not diaphoretic. No erythema. No pallor.  Psychiatric: Mood, memory and judgment normal.  Vitals reviewed.  LABORATORY DATA: Lab Results  Component Value Date   WBC 7.5 01/01/2022   HGB 14.6 01/01/2022   HCT  40.9 01/01/2022   MCV 87.2 01/01/2022   PLT 158 01/01/2022      Chemistry      Component Value Date/Time   NA 135 12/12/2021 1410   K 4.3 12/12/2021 1410   CL 101 12/12/2021 1410   CO2 28 12/12/2021 1410   BUN 14 12/12/2021 1410   CREATININE 1.18 12/12/2021 1410      Component Value Date/Time   CALCIUM 9.0 12/12/2021 1410   ALKPHOS 47 12/12/2021 1410   AST 17 12/12/2021 1410   ALT 16 12/12/2021 1410   BILITOT 0.6 12/12/2021 1410       RADIOGRAPHIC STUDIES:  No results found.   ASSESSMENT/PLAN:  This is a very pleasant 71 year old Caucasian male with metastatic lung cancer initially diagnosed as stage IIb (T1b, N1, M0) non-small cell lung cancer, adenocarcinoma.  He is status post a right upper lobe lobectomy with lymph node dissection under the care of Dr. Roxan Hockey.  This was performed on June 17, 2019.  He has no actionable mutations.  His PD-L1 expression is 90%. He had evidence for metastatic disease in November 2021 with small right pleural effusion as well as multiple right pleural implants.    The patient completed 3 of the 4 planned adjuvant chemotherapy cycles with cisplatin 75 mg per metered square and Alimta 500 mg/m. This was  discontinued after cycle #3 due to intolerance.   When the patient was found to have evidence for disease recurrence, the patient was started on immunotherapy with Libtayo (Cempilimab) 350 mg IV every 3 weeks status post 33 cycles. He is tolerating this well.   The patient had a restaging CT scan a few months ago which showed a slowly enlarging subsolid nodule which we are monitoring closely.  Dr. Julien Nordmann mention that if this continues to enlarge we would consider SBRT. This enlarged on his November 2023 imaging, and he is recently completed SBRT to this area under the care of Dr. Lisbeth Renshaw. His last dose was scheduled for 12/18/21.    Labs were reviewed, His CMP is pending. Recommend he proceed with cycle #34 today as scheduled as long as his CMP is in parameters. I will see him back for a follow up visit in 3 weeks for evaluation before starting cycle #35. We will discuss ordering restaging CT scan at his next appointment.     The patient was advised to call immediately if he has any concerning symptoms in the interval. The patient voices understanding of current disease status and treatment options and is in agreement with the current care plan. All questions were answered. The patient knows to call the clinic with any problems, questions or concerns. We can certainly see the patient much sooner if necessary    No orders of the defined types were placed in this encounter.   The total time spent in the appointment was 20-29 minutes.   Aldrin Engelhard L Savino Whisenant, PA-C 01/01/22

## 2022-01-01 ENCOUNTER — Inpatient Hospital Stay: Payer: Medicare Other

## 2022-01-01 ENCOUNTER — Inpatient Hospital Stay: Payer: Medicare Other | Attending: Internal Medicine

## 2022-01-01 ENCOUNTER — Inpatient Hospital Stay (HOSPITAL_BASED_OUTPATIENT_CLINIC_OR_DEPARTMENT_OTHER): Payer: Medicare Other | Admitting: Physician Assistant

## 2022-01-01 VITALS — BP 135/75 | HR 89 | Temp 98.1°F | Resp 15 | Wt 135.9 lb

## 2022-01-01 DIAGNOSIS — Z794 Long term (current) use of insulin: Secondary | ICD-10-CM | POA: Diagnosis not present

## 2022-01-01 DIAGNOSIS — C3491 Malignant neoplasm of unspecified part of right bronchus or lung: Secondary | ICD-10-CM

## 2022-01-01 DIAGNOSIS — C3412 Malignant neoplasm of upper lobe, left bronchus or lung: Secondary | ICD-10-CM | POA: Diagnosis not present

## 2022-01-01 DIAGNOSIS — E109 Type 1 diabetes mellitus without complications: Secondary | ICD-10-CM | POA: Insufficient documentation

## 2022-01-01 DIAGNOSIS — C3411 Malignant neoplasm of upper lobe, right bronchus or lung: Secondary | ICD-10-CM | POA: Diagnosis not present

## 2022-01-01 DIAGNOSIS — C782 Secondary malignant neoplasm of pleura: Secondary | ICD-10-CM | POA: Insufficient documentation

## 2022-01-01 DIAGNOSIS — Z5112 Encounter for antineoplastic immunotherapy: Secondary | ICD-10-CM

## 2022-01-01 DIAGNOSIS — Z923 Personal history of irradiation: Secondary | ICD-10-CM | POA: Diagnosis not present

## 2022-01-01 LAB — CMP (CANCER CENTER ONLY)
ALT: 18 U/L (ref 0–44)
AST: 18 U/L (ref 15–41)
Albumin: 4 g/dL (ref 3.5–5.0)
Alkaline Phosphatase: 47 U/L (ref 38–126)
Anion gap: 5 (ref 5–15)
BUN: 17 mg/dL (ref 8–23)
CO2: 28 mmol/L (ref 22–32)
Calcium: 8.7 mg/dL — ABNORMAL LOW (ref 8.9–10.3)
Chloride: 100 mmol/L (ref 98–111)
Creatinine: 1.27 mg/dL — ABNORMAL HIGH (ref 0.61–1.24)
GFR, Estimated: >60 mL/min (ref >60)
Glucose, Bld: 340 mg/dL — ABNORMAL HIGH (ref 70–99)
Potassium: 4.1 mmol/L (ref 3.5–5.1)
Sodium: 133 mmol/L — ABNORMAL LOW (ref 135–145)
Total Bilirubin: 0.6 mg/dL (ref 0.3–1.2)
Total Protein: 6.5 g/dL (ref 6.5–8.1)

## 2022-01-01 LAB — CBC WITH DIFFERENTIAL (CANCER CENTER ONLY)
Abs Immature Granulocytes: 0.02 10*3/uL (ref 0.00–0.07)
Basophils Absolute: 0.1 10*3/uL (ref 0.0–0.1)
Basophils Relative: 1 %
Eosinophils Absolute: 0.2 10*3/uL (ref 0.0–0.5)
Eosinophils Relative: 3 %
HCT: 40.9 % (ref 39.0–52.0)
Hemoglobin: 14.6 g/dL (ref 13.0–17.0)
Immature Granulocytes: 0 %
Lymphocytes Relative: 14 %
Lymphs Abs: 1.1 10*3/uL (ref 0.7–4.0)
MCH: 31.1 pg (ref 26.0–34.0)
MCHC: 35.7 g/dL (ref 30.0–36.0)
MCV: 87.2 fL (ref 80.0–100.0)
Monocytes Absolute: 0.5 10*3/uL (ref 0.1–1.0)
Monocytes Relative: 7 %
Neutro Abs: 5.7 10*3/uL (ref 1.7–7.7)
Neutrophils Relative %: 75 %
Platelet Count: 158 10*3/uL (ref 150–400)
RBC: 4.69 MIL/uL (ref 4.22–5.81)
RDW: 13.1 % (ref 11.5–15.5)
WBC Count: 7.5 10*3/uL (ref 4.0–10.5)
nRBC: 0 % (ref 0.0–0.2)

## 2022-01-01 LAB — TSH: TSH: 1.048 u[IU]/mL (ref 0.350–4.500)

## 2022-01-01 MED ORDER — HEPARIN SOD (PORK) LOCK FLUSH 100 UNIT/ML IV SOLN
500.0000 [IU] | Freq: Once | INTRAVENOUS | Status: AC | PRN
  Administered 2022-01-01: 500 [IU]

## 2022-01-01 MED ORDER — SODIUM CHLORIDE 0.9 % IV SOLN
350.0000 mg | Freq: Once | INTRAVENOUS | Status: AC
  Administered 2022-01-01: 350 mg via INTRAVENOUS
  Filled 2022-01-01: qty 7

## 2022-01-01 MED ORDER — SODIUM CHLORIDE 0.9% FLUSH
10.0000 mL | INTRAVENOUS | Status: DC | PRN
  Administered 2022-01-01: 10 mL

## 2022-01-01 MED ORDER — SODIUM CHLORIDE 0.9% FLUSH
10.0000 mL | Freq: Once | INTRAVENOUS | Status: AC
  Administered 2022-01-01: 10 mL via INTRAVENOUS

## 2022-01-01 MED ORDER — SODIUM CHLORIDE 0.9 % IV SOLN: Freq: Once | INTRAVENOUS | Status: AC

## 2022-01-01 NOTE — Patient Instructions (Signed)
[facility]  Discharge Instructions: Thank you for choosing [facility] to provide your oncology and hematology care.   If you have a lab appointment with the [facility], please go directly to the [facility] and check in at the registration area.   Wear comfortable clothing and clothing appropriate for easy access to any Portacath or PICC line.   We strive to give you quality time with your provider. You may need to reschedule your appointment if you arrive late (15 or more minutes).  Arriving late affects you and other patients whose appointments are after yours.  Also, if you miss three or more appointments without notifying the office, you may be dismissed from the clinic at the provider's discretion.      For prescription refill requests, have your pharmacy contact our office and allow 72 hours for refills to be completed.    Today you received the following chemotherapy and/or immunotherapy agents: Libtayo.       To help prevent nausea and vomiting after your treatment, we encourage you to take your nausea medication as directed.  BELOW ARE SYMPTOMS THAT SHOULD BE REPORTED IMMEDIATELY: *FEVER GREATER THAN 100.4 F (38 C) OR HIGHER *CHILLS OR SWEATING *NAUSEA AND VOMITING THAT IS NOT CONTROLLED WITH YOUR NAUSEA MEDICATION *UNUSUAL SHORTNESS OF BREATH *UNUSUAL BRUISING OR BLEEDING *URINARY PROBLEMS (pain or burning when urinating, or frequent urination) *BOWEL PROBLEMS (unusual diarrhea, constipation, pain near the anus) TENDERNESS IN MOUTH AND THROAT WITH OR WITHOUT PRESENCE OF ULCERS (sore throat, sores in mouth, or a toothache) UNUSUAL RASH, SWELLING OR PAIN  UNUSUAL VAGINAL DISCHARGE OR ITCHING   Items with * indicate a potential emergency and should be followed up as soon as possible or go to the Emergency Department if any problems should occur.  Please show the CHEMOTHERAPY ALERT CARD or IMMUNOTHERAPY ALERT CARD at check-in to  the Emergency Department and triage nurse.  Should you have questions after your visit or need to cancel or reschedule your appointment, please contact [facility]  Dept: [phone]  and follow the prompts.  Office hours are 8:00 a.m. to 4:30 p.m. Monday - Friday. Please note that voicemails left after 4:00 p.m. may not be returned until the following business day.  We are closed weekends and major holidays. You have access to a nurse at all times for urgent questions. Please call the main number to the clinic Dept: [phone] and follow the prompts.   For any non-urgent questions, you may also contact your provider using MyChart. We now offer e-Visits for anyone 59 and older to request care online for non-urgent symptoms. For details visit [email or web address].   Also download the MyChart app! Go to the app store, search "MyChart", open the app, select [facility], and log in with your MyChart username and password.  Masks are optional in the cancer centers. If you would like for your care team to wear a mask while they are taking care of you, please let them know. You may have one support person who is at least 70 years old accompany you for your appointments.

## 2022-01-02 LAB — T4: T4, Total: 7.7 ug/dL (ref 4.5–12.0)

## 2022-01-18 NOTE — Progress Notes (Unsigned)
[name] [id number]  DIAGNOSIS: Metastatic non-small cell lung cancer initially diagnosed as stage IIB (T1b, N1, M0) non-small cell lung cancer, invasive poorly differentiated carcinoma diagnosed in June 2021 with disease recurrence in November 2021 Molecular studies are negative for EGFR mutation as well as ALK gene translocation.  PD-L1 expression was 50-100%.  This is based on the report from the [facility] trial. Molecular studies by [facility]: Showed no actionable mutations and PD-L1 expression was 90%  PRIOR THERAPY: 1) Status post right upper lobectomy with lymph node dissection on June 17, 2019 under the care of Dr. Roxan Hockey. 2) Adjuvant systemic chemotherapy with cisplatin 75 mg/M2 and Alimta 500 mg/M2 every 3 weeks.  First dose September 02, 2019. Status post 3 cycles.  His treatment was discontinued secondary to intolerance. 3) 2) SBRT to the left upper lobe nodule under the care of Dr. Lisbeth Renshaw. Last dose expected on 12/18/21.    CURRENT THERAPY: 1) First-line treatment with immunotherapy with Libtayo (Cempilimab) 350 mg IV every 3 weeks status post 34 cycles. First dose on 01/24/20.    INTERVAL HISTORY: Luis [name] 72 y.o. male returns to the clinic today for a follow-up visit. The patient is feeling fairly well today without any concerning complaints.  For systemic treatment, he is currently on immunotherapy with Libtayo.  He is tolerating this well except he did develop type 1 diabetes for which he is seeing endocrinology and taking insulin. He has a portable device that tells him his BS in real time. He states it spikes after he eats but then downtrends.     Today, he states he is fairly well. He sometimes has baseline occasional chest discomfort along his prior surgical site in the right chest wall.  Denies any cough. Denies hemoptysis.  Denies any nausea, vomiting, or constipation. He has  chronic intermittent diarrhea, which is stable. He cannot take imodium because even a small amount causes him to have severe constipation. The patient sometimes may develop a mild itching without rash with his treatment for which he will use hydrocortisone cream if needed. He states it is manageable and only bothers him if he itches himself while he sleeps. The patient is here today for evaluation before undergoing cycle #35.        MEDICAL HISTORY: Past Medical History:  Diagnosis Date   Arthritis    through out body   Chronic bronchitis (HCC)    Concussion    COPD (chronic obstructive pulmonary disease) ([place])    Diabetes mellitus without complication (HCC)    Dyspnea    Emphysema lung (HCC)    Fatigue    Frozen shoulder    LEFT   GERD (gastroesophageal reflux disease)    Hard of hearing    Headache    alot of days, related to spine issues   Hypertension    Lumbar radiculopathy 2013   nscl ca 04/2019   Peyronie's disease    Prostatitis 2011   Dr. Gaynelle Arabian   RLS (restless legs syndrome)    Shingles 06/13/2018   shingles on back, finished with prednisone, stills has scabs and pain   Thigh pain    Bilateral Thigh   Thigh pain 10/2011   bilateral   Tobacco abuse 2010   still uses electronic cig/ emphysema, SOB easily   Vertigo    per wifes update   Wears partial dentures    upper  ALLERGIES:  is allergic to ativan [lorazepam], doxycycline, and tramadol.  MEDICATIONS:  Current Outpatient Medications  Medication Sig Dispense Refill   acetaminophen (TYLENOL) 500 MG tablet Take 1,000 mg by mouth at bedtime as needed for moderate pain.     albuterol (VENTOLIN HFA) 108 (90 Base) MCG/ACT inhaler INHALE 1-2 PUFFS BY ORAL INHALATION EVERY 4 HOURS FOR CHRONIC OBSTRUCTIVE LUNG DISEASE BE SURE TO WASH MOUTHPIECE WITH WARM WATER ONCE A WEEK     amLODipine (NORVASC) 5 MG tablet TAKE ONE-HALF TABLET BY MOUTH EVERY DAY FOR BLOOD PRESSURE. NOTE NEW MEDICINE     b complex vitamins  capsule Take 1 capsule by mouth daily.     Cholecalciferol (VITAMIN D) 125 MCG (5000 UT) CAPS Take 5,000 Units by mouth daily.     citalopram (CELEXA) 20 MG tablet Take 1 tablet by mouth daily.     Continuous Blood Gluc Receiver ([place]) DEVI by Does not apply route.     Continuous Blood Gluc Sensor (DEXCOM G7 SENSOR) MISC by Does not apply route.     fluticasone-salmeterol (ADVAIR) 250-50 MCG/ACT AEPB 1 puff     glucose blood (ACCU-CHEK GUIDE) test strip See admin instructions.     insulin aspart (NOVOLOG FLEXPEN) 100 UNIT/ML FlexPen Max daily 30 units 15 mL 11   insulin glargine (LANTUS SOLOSTAR) 100 UNIT/ML Solostar Pen Inject 10 Units into the skin daily. (Patient taking differently: Inject 8 Units into the skin daily.) 15 mL 6   Insulin Pen Needle 32G X 4 MM MISC 1 Device by Does not apply route in the morning, at noon, in the evening, and at bedtime. 400 each 3   lidocaine-prilocaine (EMLA) cream Apply 1 Application topically as needed. Apply 1 tsp over port site at least 45 minutes prior to lab appointment.Do not rub in cream. Cover with plastic wrap. 30 g 5   losartan (COZAAR) 50 MG tablet TAKE ONE TABLET BY MOUTH EVERY DAY FOR HYPERTENSION     Multiple Vitamin (MULTIVITAMIN) tablet Take 1 tablet by mouth 2 (two) times a week.     vitamin B-12 (CYANOCOBALAMIN) 500 MCG tablet Take 500 mcg by mouth daily.     No current facility-administered medications for this visit.    SURGICAL HISTORY:  Past Surgical History:  Procedure Laterality Date   CATARACT EXTRACTION W/PHACO Right 07/08/2018   Procedure: CATARACT EXTRACTION PHACO AND INTRAOCULAR LENS PLACEMENT ([place]) RIGHT;  Surgeon: Leandrew Koyanagi, MD;  Location: [facility];  Service: Ophthalmology;  Laterality: Right;   CATARACT EXTRACTION W/PHACO Left 07/29/2018   Procedure: CATARACT EXTRACTION PHACO AND INTRAOCULAR LENS PLACEMENT ([facility]) LEFT TORIC LENS;  Surgeon: Leandrew Koyanagi, MD;  Location: [facility];  Service: Ophthalmology;  Laterality: Left;   CHEST TUBE INSERTION Right 07/01/2019   Procedure: CHEST TUBE INSERTION;  Surgeon: [name], MD;  Location: [facility];  Service: Thoracic;  Laterality: Right;   COLONOSCOPY     EYE SURGERY     INTERCOSTAL NERVE BLOCK Right 06/17/2019   Procedure: INTERCOSTAL NERVE BLOCK;  Surgeon: [name], MD;  Location: [facility];  Service: Thoracic;  Laterality: Right;   INTERCOSTAL NERVE BLOCK Right 07/01/2019   Procedure: INTERCOSTAL NERVE BLOCK;  Surgeon: [name], MD;  Location: [facility];  Service: Thoracic;  Laterality: Right;   IR IMAGING GUIDED PORT INSERTION  02/21/2020   TONSILLECTOMY     VIDEO ASSISTED THORACOSCOPY Right 07/01/2019   Procedure: VIDEO ASSISTED THORACOSCOPY - REMOVAL OF RIGHT MIDDLE LOBE BLEBS;  Surgeon: [name], MD;  Location: [facility] OR;  Service: Thoracic;  Laterality: Right;   VIDEO BRONCHOSCOPY WITH INSERTION OF INTERBRONCHIAL VALVE (IBV) Right 06/25/2019   Procedure: VIDEO BRONCHOSCOPY WITH INSERTION OF INTERBRONCHIAL VALVE (IBV); Size 7 IBV into Right Loer Lobe (RLL) Superior Segment, Size 9 IBV into RLL Basilar Segment;  Surgeon: [name], MD;  Location: [facility] OR;  Service: Thoracic;  Laterality: Right;   VIDEO BRONCHOSCOPY WITH INSERTION OF INTERBRONCHIAL VALVE (IBV) N/A 08/06/2019   Procedure: VIDEO BRONCHOSCOPY WITH REMOVAL OF INTERBRONCHIAL VALVE (IBV);  Surgeon: [name], MD;  Location: [facility] OR;  Service: Thoracic;  Laterality: N/A;    REVIEW OF SYSTEMS:   Review of Systems  Constitutional: Positive for fatigue. Negative for appetite change, chills, fatigue, fever and unexpected weight change.  HENT: Negative for mouth sores, nosebleeds, sore throat and trouble swallowing.   Eyes: Negative for eye problems and icterus.  Respiratory: Positive for baseline dyspnea exertion.  Negative for cough, hemoptysis, and wheezing.   Cardiovascular: Negative for chest pain and leg  swelling.  Gastrointestinal: Positive for baseline intermittent diarrhea.  No diarrhea at this time.  Negative for constipation, nausea and vomiting.  Genitourinary: Negative for bladder incontinence, difficulty urinating, dysuria, frequency and hematuria.   Musculoskeletal: Negative for back pain, gait problem, neck pain and neck stiffness.  Skin: Positive for intermittent itching and rash.  Neurological:  Negative for dizziness, headaches, extremity weakness, gait problem, light-headedness and seizures.  Hematological: Negative for adenopathy. Does not bruise/bleed easily.  Psychiatric/Behavioral: Negative for confusion, depression and sleep disturbance. The patient is not nervous/anxious.   PHYSICAL EXAMINATION:  Blood pressure 128/73, pulse 82, temperature 98.1 F (36.7 C), temperature source Oral, resp. rate 14, weight 136 lb 4.8 oz (61.8 kg), SpO2 96 %.  ECOG PERFORMANCE STATUS: 1  Physical Exam  Constitutional: Oriented to person, place, and time and well-developed, well-nourished, and in no distress.  HENT:  Head: Normocephalic and atraumatic.  Mouth/Throat: Oropharynx is clear and moist. No oropharyngeal exudate.  Eyes: Conjunctivae are normal. Right eye exhibits no discharge. Left eye exhibits no discharge. No scleral icterus.  Neck: Normal range of motion. Neck supple.  Cardiovascular: Normal rate, regular rhythm, normal heart sounds and intact distal pulses.   Pulmonary/Chest: Effort normal. Positive for quiet breath sounds bilaterally. No respiratory distress. No wheezes. No rales.  Abdominal: Soft. Bowel sounds are normal. Exhibits no distension and no mass. There is no tenderness.  Musculoskeletal: Normal range of motion. Exhibits no edema.  Lymphadenopathy:    No cervical adenopathy.  Neurological: Alert and oriented to person, place, and time. Exhibits normal muscle tone. Gait normal. Coordination normal.  Skin: Skin is warm and dry. Mild rash on eyebrows. Positive for  scratches from his dog on left forearm. Not diaphoretic. No erythema. No pallor.  Psychiatric: Mood, memory and judgment normal.  Vitals reviewed.  LABORATORY DATA: Lab Results  Component Value Date   WBC 6.6 01/22/2022   HGB 15.0 01/22/2022   HCT 42.3 01/22/2022   MCV 87.6 01/22/2022   PLT 199 01/22/2022      Chemistry      Component Value Date/Time   NA 136 01/22/2022 1402   K 4.2 01/22/2022 1402   CL 102 01/22/2022 1402   CO2 29 01/22/2022 1402   BUN 16 01/22/2022 1402   CREATININE 1.16 01/22/2022 1402      Component Value Date/Time   CALCIUM 9.3 01/22/2022 1402   ALKPHOS 45 01/22/2022 1402   AST 14 (L)  01/22/2022 1402   ALT 14 01/22/2022 1402   BILITOT 0.7 01/22/2022 1402       RADIOGRAPHIC STUDIES:  No results found.   ASSESSMENT/PLAN:  This is a very pleasant 72 year old Caucasian male with metastatic lung cancer initially diagnosed as stage IIb (T1b, N1, M0) non-small cell lung cancer, adenocarcinoma.  He is status post a right upper lobe lobectomy with lymph node dissection under the care of Dr. Roxan Hockey.  This was performed on June 17, 2019.  He has no actionable mutations.  His PD-L1 expression is 90%. He had evidence for metastatic disease in November 2021 with small right pleural effusion as well as multiple right pleural implants.    The patient completed 3 of the 4 planned adjuvant chemotherapy cycles with cisplatin 75 mg per metered square and Alimta 500 mg/m. This was discontinued after cycle #3 due to intolerance.  When the patient was found to have evidence for disease recurrence, the patient was started on immunotherapy with Libtayo (Cempilimab) 350 mg IV every 3 weeks status post 34 cycles. He is tolerating this well.   The patient had a restaging CT scan a few months ago which showed a slowly enlarging subsolid nodule which we are monitoring closely. Dr. Julien Nordmann mention that if this continues to enlarge we would consider SBRT. This enlarged on his  November 2023 imaging, and he is recently completed SBRT to this area under the care of Dr. Lisbeth Renshaw. His last dose was scheduled for 12/18/21.   They will meet the patient's last cycle of Libtayo as he has completed 2 years.  We will arrange for restaging CT scan of the chest, abdomen, and pelvis.  We will see him back for follow-up visit in 3 weeks for evaluation to review his scan results and discuss next steps in his care.  Labs were reviewed.  He will proceed with cycle number 35 today as schedule.  The patient was advised to call immediately if she has any concerning symptoms in the interval. The patient voices understanding of current disease status and treatment options and is in agreement with the current care plan. All questions were answered. The patient knows to call the clinic with any problems, questions or concerns. We can certainly see the patient much sooner if necessary          Orders Placed This Encounter  Procedures   CT CHEST ABDOMEN PELVIS W CONTRAST    Standing Status:   Future    Standing Expiration Date:   01/23/2023    Order Specific Question:   If indicated for the ordered procedure, I authorize the administration of contrast media per Radiology protocol    Answer:   Yes    Order Specific Question:   Does the patient have a contrast media/X-ray dye allergy?    Answer:   No    Order Specific Question:   Preferred imaging location?    Answer:   [facility]    Order Specific Question:   Is Oral Contrast requested for this exam?    Answer:   Yes, Per Radiology protocol   CBC with Differential ([facility] Only)    Standing Status:   Future    Standing Expiration Date:   01/23/2023   CMP ([facility] only)    Standing Status:   Future    Standing Expiration Date:   01/23/2023      The total time spent in the appointment was 20-29 minutes.   Heide Brossart L Marcelle Hepner, PA-C 01/22/22

## 2022-01-21 ENCOUNTER — Telehealth: Payer: Self-pay | Admitting: Internal Medicine

## 2022-01-21 NOTE — Telephone Encounter (Signed)
Called patient regarding upcoming January appointments, left a voicemail.

## 2022-01-22 ENCOUNTER — Inpatient Hospital Stay: Payer: Medicare Other | Attending: Internal Medicine

## 2022-01-22 ENCOUNTER — Inpatient Hospital Stay: Payer: Medicare Other

## 2022-01-22 ENCOUNTER — Inpatient Hospital Stay (HOSPITAL_BASED_OUTPATIENT_CLINIC_OR_DEPARTMENT_OTHER): Payer: Medicare Other | Admitting: Physician Assistant

## 2022-01-22 ENCOUNTER — Other Ambulatory Visit: Payer: Self-pay

## 2022-01-22 VITALS — BP 128/73 | HR 82 | Temp 98.1°F | Resp 14 | Wt 136.3 lb

## 2022-01-22 DIAGNOSIS — C3412 Malignant neoplasm of upper lobe, left bronchus or lung: Secondary | ICD-10-CM | POA: Insufficient documentation

## 2022-01-22 DIAGNOSIS — Z923 Personal history of irradiation: Secondary | ICD-10-CM | POA: Diagnosis not present

## 2022-01-22 DIAGNOSIS — C3491 Malignant neoplasm of unspecified part of right bronchus or lung: Secondary | ICD-10-CM

## 2022-01-22 DIAGNOSIS — E109 Type 1 diabetes mellitus without complications: Secondary | ICD-10-CM | POA: Insufficient documentation

## 2022-01-22 DIAGNOSIS — Z5112 Encounter for antineoplastic immunotherapy: Secondary | ICD-10-CM | POA: Insufficient documentation

## 2022-01-22 DIAGNOSIS — Z9221 Personal history of antineoplastic chemotherapy: Secondary | ICD-10-CM | POA: Diagnosis not present

## 2022-01-22 DIAGNOSIS — Z794 Long term (current) use of insulin: Secondary | ICD-10-CM | POA: Insufficient documentation

## 2022-01-22 DIAGNOSIS — Z902 Acquired absence of lung [part of]: Secondary | ICD-10-CM | POA: Diagnosis not present

## 2022-01-22 DIAGNOSIS — C3411 Malignant neoplasm of upper lobe, right bronchus or lung: Secondary | ICD-10-CM | POA: Diagnosis not present

## 2022-01-22 DIAGNOSIS — Z7969 Long term (current) use of other immunomodulators and immunosuppressants: Secondary | ICD-10-CM | POA: Insufficient documentation

## 2022-01-22 DIAGNOSIS — C782 Secondary malignant neoplasm of pleura: Secondary | ICD-10-CM | POA: Insufficient documentation

## 2022-01-22 LAB — CMP (CANCER CENTER ONLY)
ALT: 14 U/L (ref 0–44)
AST: 14 U/L — ABNORMAL LOW (ref 15–41)
Albumin: 4.2 g/dL (ref 3.5–5.0)
Alkaline Phosphatase: 45 U/L (ref 38–126)
Anion gap: 5 (ref 5–15)
BUN: 16 mg/dL (ref 8–23)
CO2: 29 mmol/L (ref 22–32)
Calcium: 9.3 mg/dL (ref 8.9–10.3)
Chloride: 102 mmol/L (ref 98–111)
Creatinine: 1.16 mg/dL (ref 0.61–1.24)
GFR, Estimated: >60 mL/min (ref >60)
Glucose, Bld: 200 mg/dL — ABNORMAL HIGH (ref 70–99)
Potassium: 4.2 mmol/L (ref 3.5–5.1)
Sodium: 136 mmol/L (ref 135–145)
Total Bilirubin: 0.7 mg/dL (ref 0.3–1.2)
Total Protein: 6.3 g/dL — ABNORMAL LOW (ref 6.5–8.1)

## 2022-01-22 LAB — CBC WITH DIFFERENTIAL (CANCER CENTER ONLY)
Abs Immature Granulocytes: 0.02 10*3/uL (ref 0.00–0.07)
Basophils Absolute: 0.1 10*3/uL (ref 0.0–0.1)
Basophils Relative: 1 %
Eosinophils Absolute: 0.2 10*3/uL (ref 0.0–0.5)
Eosinophils Relative: 4 %
HCT: 42.3 % (ref 39.0–52.0)
Hemoglobin: 15 g/dL (ref 13.0–17.0)
Immature Granulocytes: 0 %
Lymphocytes Relative: 19 %
Lymphs Abs: 1.2 10*3/uL (ref 0.7–4.0)
MCH: 31.1 pg (ref 26.0–34.0)
MCHC: 35.5 g/dL (ref 30.0–36.0)
MCV: 87.6 fL (ref 80.0–100.0)
Monocytes Absolute: 0.5 10*3/uL (ref 0.1–1.0)
Monocytes Relative: 7 %
Neutro Abs: 4.6 10*3/uL (ref 1.7–7.7)
Neutrophils Relative %: 69 %
Platelet Count: 199 10*3/uL (ref 150–400)
RBC: 4.83 MIL/uL (ref 4.22–5.81)
RDW: 13 % (ref 11.5–15.5)
WBC Count: 6.6 10*3/uL (ref 4.0–10.5)
nRBC: 0 % (ref 0.0–0.2)

## 2022-01-22 LAB — TSH: TSH: 1.351 u[IU]/mL (ref 0.350–4.500)

## 2022-01-22 MED ORDER — SODIUM CHLORIDE 0.9% FLUSH
10.0000 mL | INTRAVENOUS | Status: DC | PRN
  Administered 2022-01-22: 10 mL

## 2022-01-22 MED ORDER — SODIUM CHLORIDE 0.9 % IV SOLN: Freq: Once | INTRAVENOUS | Status: AC

## 2022-01-22 MED ORDER — HEPARIN SOD (PORK) LOCK FLUSH 100 UNIT/ML IV SOLN
500.0000 [IU] | Freq: Once | INTRAVENOUS | Status: AC | PRN
  Administered 2022-01-22: 500 [IU]

## 2022-01-22 MED ORDER — SODIUM CHLORIDE 0.9 % IV SOLN
350.0000 mg | Freq: Once | INTRAVENOUS | Status: AC
  Administered 2022-01-22: 350 mg via INTRAVENOUS
  Filled 2022-01-22: qty 7

## 2022-01-22 NOTE — Patient Instructions (Signed)
[facility] ONCOLOGY  Discharge Instructions: Thank you for choosing [facility] to provide your oncology and hematology care.   If you have a lab appointment with the [facility], please go directly to the [facility] and check in at the registration area.   Wear comfortable clothing and clothing appropriate for easy access to any Portacath or PICC line.   We strive to give you quality time with your provider. You may need to reschedule your appointment if you arrive late (15 or more minutes).  Arriving late affects you and other patients whose appointments are after yours.  Also, if you miss three or more appointments without notifying the office, you may be dismissed from the clinic at the provider's discretion.      For prescription refill requests, have your pharmacy contact our office and allow 72 hours for refills to be completed.    Today you received the following chemotherapy and/or immunotherapy agents: Libtayo.       To help prevent nausea and vomiting after your treatment, we encourage you to take your nausea medication as directed.  BELOW ARE SYMPTOMS THAT SHOULD BE REPORTED IMMEDIATELY: *FEVER GREATER THAN 100.4 F (38 C) OR HIGHER *CHILLS OR SWEATING *NAUSEA AND VOMITING THAT IS NOT CONTROLLED WITH YOUR NAUSEA MEDICATION *UNUSUAL SHORTNESS OF BREATH *UNUSUAL BRUISING OR BLEEDING *URINARY PROBLEMS (pain or burning when urinating, or frequent urination) *BOWEL PROBLEMS (unusual diarrhea, constipation, pain near the anus) TENDERNESS IN MOUTH AND THROAT WITH OR WITHOUT PRESENCE OF ULCERS (sore throat, sores in mouth, or a toothache) UNUSUAL RASH, SWELLING OR PAIN  UNUSUAL VAGINAL DISCHARGE OR ITCHING   Items with * indicate a potential emergency and should be followed up as soon as possible or go to the Emergency Department if any problems should occur.  Please show the CHEMOTHERAPY ALERT CARD or IMMUNOTHERAPY ALERT CARD at check-in to  the Emergency Department and triage nurse.  Should you have questions after your visit or need to cancel or reschedule your appointment, please contact [facility]  Dept: [phone]  and follow the prompts.  Office hours are 8:00 a.m. to 4:30 p.m. Monday - Friday. Please note that voicemails left after 4:00 p.m. may not be returned until the following business day.  We are closed weekends and major holidays. You have access to a nurse at all times for urgent questions. Please call the main number to the clinic Dept: [phone] and follow the prompts.   For any non-urgent questions, you may also contact your provider using MyChart. We now offer e-Visits for anyone 40 and older to request care online for non-urgent symptoms. For details visit [email or web address].   Also download the MyChart app! Go to the app store, search "MyChart", open the app, select [facility], and log in with your MyChart username and password.  Masks are optional in the cancer centers. If you would like for your care team to wear a mask while they are taking care of you, please let them know. You may have one support person who is at least 71 years old accompany you for your appointments.

## 2022-01-23 LAB — T4: T4, Total: 7.8 ug/dL (ref 4.5–12.0)

## 2022-01-24 ENCOUNTER — Other Ambulatory Visit: Payer: Self-pay

## 2022-01-26 ENCOUNTER — Other Ambulatory Visit: Payer: Self-pay

## 2022-02-04 ENCOUNTER — Ambulatory Visit
Admission: RE | Admit: 2022-02-04 | Discharge: 2022-02-04 | Disposition: A | Discharge status: Home / Self Care | Payer: Medicare Other | Source: Ambulatory Visit | Attending: Radiation Oncology | Admitting: Radiation Oncology

## 2022-02-04 NOTE — Progress Notes (Signed)
  Radiation Oncology         [phone] ________________________________  Name: Luis [name] MRN: [id number]  Date of Service: 02/04/2022  DOB: Dec 10, 1950  Post Treatment Telephone Note  Diagnosis:  Progressive metastatic Stage IIB, cT1bN1M0, NSCLC, poorly differentiated carcinoma consistent with adenocarcinoma of the right upper lobe now involving the left upper lobe versus synchronous stage I non-small cell lung cancer in the left upper lobe. (as documented in provider EOT note)   The patient was not available for call today. A detailed voicemail was left.   The patient has not scheduled a follow up with his medical oncologist Dr. Arbutus Ped for ongoing care, and was encouraged to call if he develops concerns or questions regarding radiation. Dr. Arbutus Ped has been notified of this patient's need for follow-up.   Ruel Favors, LPN

## 2022-02-12 ENCOUNTER — Ambulatory Visit
Admission: RE | Admit: 2022-02-12 | Discharge: 2022-02-12 | Disposition: A | Discharge status: Home / Self Care | Payer: Medicare Other | Source: Ambulatory Visit | Attending: Physician Assistant | Admitting: Physician Assistant

## 2022-02-12 ENCOUNTER — Telehealth: Payer: Self-pay | Admitting: Internal Medicine

## 2022-02-12 DIAGNOSIS — C3491 Malignant neoplasm of unspecified part of right bronchus or lung: Secondary | ICD-10-CM | POA: Diagnosis not present

## 2022-02-12 DIAGNOSIS — C3411 Malignant neoplasm of upper lobe, right bronchus or lung: Secondary | ICD-10-CM | POA: Diagnosis not present

## 2022-02-12 DIAGNOSIS — S2242XA Multiple fractures of ribs, left side, initial encounter for closed fracture: Secondary | ICD-10-CM | POA: Diagnosis not present

## 2022-02-12 DIAGNOSIS — K7689 Other specified diseases of liver: Secondary | ICD-10-CM | POA: Diagnosis not present

## 2022-02-12 DIAGNOSIS — J929 Pleural plaque without asbestos: Secondary | ICD-10-CM | POA: Diagnosis not present

## 2022-02-12 DIAGNOSIS — N281 Cyst of kidney, acquired: Secondary | ICD-10-CM | POA: Diagnosis not present

## 2022-02-12 DIAGNOSIS — K5730 Diverticulosis of large intestine without perforation or abscess without bleeding: Secondary | ICD-10-CM | POA: Diagnosis not present

## 2022-02-12 DIAGNOSIS — J432 Centrilobular emphysema: Secondary | ICD-10-CM | POA: Diagnosis not present

## 2022-02-12 MED ORDER — IOHEXOL 300 MG/ML  SOLN
80.0000 mL | Freq: Once | INTRAMUSCULAR | Status: AC | PRN
  Administered 2022-02-12: 80 mL via INTRAVENOUS

## 2022-02-12 MED ORDER — HEPARIN SOD (PORK) LOCK FLUSH 100 UNIT/ML IV SOLN
500.0000 [IU] | Freq: Once | INTRAVENOUS | Status: AC
  Administered 2022-02-12: 500 [IU] via INTRAVENOUS

## 2022-02-12 NOTE — Progress Notes (Signed)
[name] [address]  DIAGNOSIS:  Metastatic non-small cell lung cancer initially diagnosed as stage IIB (T1b, N1, M0) non-small cell lung cancer, invasive poorly differentiated carcinoma diagnosed in June 2021 with disease recurrence in November 2021 Molecular studies are negative for EGFR mutation as well as ALK gene translocation.  PD-L1 expression was 50-100%.  This is based on the report from the [facility] trial. Molecular studies by [employer]: Showed no actionable mutations and PD-L1 expression was 90%  PRIOR THERAPY: 1) Status post right upper lobectomy with lymph node dissection on June 17, 2019 under the care of Dr. Roxan Hockey. 2) Adjuvant systemic chemotherapy with cisplatin 75 mg/M2 and Alimta 500 mg/M2 every 3 weeks.  First dose September 02, 2019. Status post 3 cycles.  His treatment was discontinued secondary to intolerance. 3) SBRT to the left upper lobe nodule under the care of Dr. Lisbeth Renshaw. Last dose expected on 12/18/21.  4) First-line treatment with immunotherapy with Libtayo (Cempilimab) 350 mg IV every 3 weeks status post 35 cycles. Last dose on 01/22/22.   CURRENT THERAPY: Observation   INTERVAL HISTORY: Luis [name] 72 y.o. male returns to the clinic today for a follow-up visit. The patient is feeling fairly well today without any concerning complaints.  For systemic treatment, he completed two years of immunotherapy with Libtayo at his last appointment on 01/22/22.  He tolerated this well except he did develop type 1 diabetes for which he is seeing endocrinology and taking insulin. He has a portable device that tells him his BS in real time. He states it spikes after he eats but then downtrends.   Today, he states he is fairly well. He did get night sweats a few nights ago after receiving contrast. This is unusual for him. Denies symptoms of infection. He sometimes has baseline occasional chest  discomfort along his prior surgical site in the right chest wall.  Denies any cough. Denies hemoptysis. He has stable dyspnea on exertion. Denies any nausea, vomiting, or constipation. He has chronic intermittent diarrhea, which is stable. He cannot take imodium because even a small amount causes him to have severe constipation. The patient sometimes may develop a mild itching without rash with his treatment for which he will use hydrocortisone cream if needed. He states it is manageable and only bothers him if he itches himself while he sleeps. He recently had a restaging CT scan performed. The patient is here today to review his scan results and for a more detailed discussion about his current condition.   MEDICAL HISTORY: Past Medical History:  Diagnosis Date   Arthritis    through out body   Chronic bronchitis (HCC)    Concussion    COPD (chronic obstructive pulmonary disease) (HCC)    Diabetes mellitus without complication (HCC)    Dyspnea    Emphysema lung (HCC)    Fatigue    Frozen shoulder    LEFT   GERD (gastroesophageal reflux disease)    Hard of hearing    Headache    alot of days, related to spine issues   Hypertension    Lumbar radiculopathy 2013   nscl ca 04/2019   Peyronie's disease    Prostatitis 2011   Dr. Gaynelle Arabian   RLS (restless legs syndrome)    Shingles 06/13/2018   shingles on back, finished with prednisone, stills has scabs and pain   Thigh pain    Bilateral Thigh  Thigh pain 10/2011   bilateral   Tobacco abuse 2010   still uses electronic cig/ emphysema, SOB easily   Vertigo    per wifes update   Wears partial dentures    upper    ALLERGIES:  is allergic to ativan [lorazepam], doxycycline, and tramadol.  MEDICATIONS:  Current Outpatient Medications  Medication Sig Dispense Refill   acetaminophen (TYLENOL) 500 MG tablet Take 1,000 mg by mouth at bedtime as needed for moderate pain.     albuterol (VENTOLIN HFA) 108 (90 Base) MCG/ACT inhaler  INHALE 1-2 PUFFS BY ORAL INHALATION EVERY 4 HOURS FOR CHRONIC OBSTRUCTIVE LUNG DISEASE BE SURE TO WASH MOUTHPIECE WITH WARM WATER ONCE A WEEK     amLODipine (NORVASC) 5 MG tablet TAKE ONE-HALF TABLET BY MOUTH EVERY DAY FOR BLOOD PRESSURE. NOTE NEW MEDICINE     b complex vitamins capsule Take 1 capsule by mouth daily.     Cholecalciferol (VITAMIN D) 125 MCG (5000 UT) CAPS Take 5,000 Units by mouth daily.     citalopram (CELEXA) 20 MG tablet Take 1 tablet by mouth daily.     Continuous Blood Gluc Receiver ([place]) DEVI by Does not apply route.     Continuous Blood Gluc Sensor (DEXCOM G7 SENSOR) MISC by Does not apply route.     fluticasone-salmeterol (ADVAIR) 250-50 MCG/ACT AEPB 1 puff     glucose blood (ACCU-CHEK GUIDE) test strip See admin instructions.     insulin aspart (NOVOLOG FLEXPEN) 100 UNIT/ML FlexPen Max daily 30 units 15 mL 11   insulin glargine (LANTUS SOLOSTAR) 100 UNIT/ML Solostar Pen Inject 10 Units into the skin daily. (Patient taking differently: Inject 8 Units into the skin daily.) 15 mL 6   Insulin Pen Needle 32G X 4 MM MISC 1 Device by Does not apply route in the morning, at noon, in the evening, and at bedtime. 400 each 3   lidocaine-prilocaine (EMLA) cream Apply 1 Application topically as needed. Apply 1 tsp over port site at least 45 minutes prior to lab appointment.Do not rub in cream. Cover with plastic wrap. 30 g 5   losartan (COZAAR) 50 MG tablet TAKE ONE TABLET BY MOUTH EVERY DAY FOR HYPERTENSION     Multiple Vitamin (MULTIVITAMIN) tablet Take 1 tablet by mouth 2 (two) times a week.     vitamin B-12 (CYANOCOBALAMIN) 500 MCG tablet Take 500 mcg by mouth daily.     No current facility-administered medications for this visit.    SURGICAL HISTORY:  Past Surgical History:  Procedure Laterality Date   CATARACT EXTRACTION W/PHACO Right 07/08/2018   Procedure: CATARACT EXTRACTION PHACO AND INTRAOCULAR LENS PLACEMENT ([place]) RIGHT;  Surgeon: Leandrew Koyanagi,  MD;  Location: [facility];  Service: Ophthalmology;  Laterality: Right;   CATARACT EXTRACTION W/PHACO Left 07/29/2018   Procedure: CATARACT EXTRACTION PHACO AND INTRAOCULAR LENS PLACEMENT ([place]) LEFT TORIC LENS;  Surgeon: Leandrew Koyanagi, MD;  Location: [facility];  Service: Ophthalmology;  Laterality: Left;   CHEST TUBE INSERTION Right 07/01/2019   Procedure: CHEST TUBE INSERTION;  Surgeon: [name], MD;  Location: [facility];  Service: Thoracic;  Laterality: Right;   COLONOSCOPY     EYE SURGERY     INTERCOSTAL NERVE BLOCK Right 06/17/2019   Procedure: INTERCOSTAL NERVE BLOCK;  Surgeon: [name], MD;  Location: [facility];  Service: Thoracic;  Laterality: Right;   INTERCOSTAL NERVE BLOCK Right 07/01/2019   Procedure: INTERCOSTAL NERVE BLOCK;  Surgeon: [name], MD;  Location: [facility];  Service: Thoracic;  Laterality: Right;   IR IMAGING GUIDED PORT INSERTION  02/21/2020   TONSILLECTOMY     VIDEO ASSISTED THORACOSCOPY Right 07/01/2019   Procedure: VIDEO ASSISTED THORACOSCOPY - REMOVAL OF RIGHT MIDDLE LOBE BLEBS;  Surgeon: [name], MD;  Location: [facility];  Service: Thoracic;  Laterality: Right;   VIDEO BRONCHOSCOPY WITH INSERTION OF INTERBRONCHIAL VALVE (IBV) Right 06/25/2019   Procedure: VIDEO BRONCHOSCOPY WITH INSERTION OF INTERBRONCHIAL VALVE (IBV); Size 7 IBV into Right Loer Lobe (RLL) Superior Segment, Size 9 IBV into RLL Basilar Segment;  Surgeon: [name], MD;  Location: [facility];  Service: Thoracic;  Laterality: Right;   VIDEO BRONCHOSCOPY WITH INSERTION OF INTERBRONCHIAL VALVE (IBV) N/A 08/06/2019   Procedure: VIDEO BRONCHOSCOPY WITH REMOVAL OF INTERBRONCHIAL VALVE (IBV);  Surgeon: [name], MD;  Location[facility];  Service: Thoracic;  Laterality: N/A;    REVIEW OF SYSTEMS:   Review of Systems  Constitutional: Positive for fatigue. Negative for appetite change, chills, fatigue, fever and unexpected weight change.   HENT: Negative for mouth sores, nosebleeds, sore throat and trouble swallowing.   Eyes: Negative for eye problems and icterus.  Respiratory: Positive for baseline dyspnea exertion.  Negative for cough, hemoptysis, and wheezing.   Cardiovascular: Negative for chest pain and leg swelling.  Gastrointestinal: Positive for baseline intermittent diarrhea.  No diarrhea at this time.  Negative for constipation, nausea and vomiting.  Genitourinary: Negative for bladder incontinence, difficulty urinating, dysuria, frequency and hematuria.   Musculoskeletal: Negative for back pain, gait problem, neck pain and neck stiffness.  Skin: Positive for intermittent itching and rash.  Neurological:  Negative for dizziness, headaches, extremity weakness, gait problem, light-headedness and seizures.  Hematological: Negative for adenopathy. Does not bruise/bleed easily.  Psychiatric/Behavioral: Negative for confusion, depression and sleep disturbance. The patient is not nervous/anxious.   PHYSICAL EXAMINATION:  Blood pressure 134/72, pulse 82, temperature 97.6 F (36.4 C), temperature source Oral, resp. rate 14, weight 135 lb 11.2 oz (61.6 kg), SpO2 97 %.  ECOG PERFORMANCE STATUS: 1  Physical Exam  Constitutional: Oriented to person, place, and time and well-developed, well-nourished, and in no distress.  HENT:  Head: Normocephalic and atraumatic.  Mouth/Throat: Oropharynx is clear and moist. No oropharyngeal exudate.  Eyes: Conjunctivae are normal. Right eye exhibits no discharge. Left eye exhibits no discharge. No scleral icterus.  Neck: Normal range of motion. Neck supple.  Cardiovascular: Normal rate, regular rhythm, normal heart sounds and intact distal pulses.   Pulmonary/Chest: Effort normal. Positive for quiet breath sounds bilaterally. No respiratory distress. No wheezes. No rales.  Abdominal: Soft. Bowel sounds are normal. Exhibits no distension and no mass. There is no tenderness.   Musculoskeletal: Normal range of motion. Exhibits no edema.  Lymphadenopathy:    No cervical adenopathy.  Neurological: Alert and oriented to person, place, and time. Exhibits normal muscle tone. Gait normal. Coordination normal.  Skin: Skin is warm and dry. Mild rash on eyebrows. Positive for scratches from his dog on left forearm. Not diaphoretic. No erythema. No pallor.  Psychiatric: Mood, memory and judgment normal.  Vitals reviewed.  LABORATORY DATA: Lab Results  Component Value Date   WBC 6.0 02/14/2022   HGB 14.9 02/14/2022   HCT 41.0 02/14/2022   MCV 86.7 02/14/2022   PLT 180 02/14/2022      Chemistry      Component Value Date/Time   NA 135 02/14/2022 0915   K 4.3 02/14/2022 0915   CL 100 02/14/2022 0915   CO2  29 02/14/2022 0915   BUN 22 02/14/2022 0915   CREATININE 1.02 02/14/2022 0915      Component Value Date/Time   CALCIUM 9.2 02/14/2022 0915   ALKPHOS 48 02/14/2022 0915   AST 13 (L) 02/14/2022 0915   ALT 15 02/14/2022 0915   BILITOT 0.8 02/14/2022 0915       RADIOGRAPHIC STUDIES:  CT CHEST ABDOMEN PELVIS W CONTRAST  Result Date: 02/12/2022 CLINICAL DATA:  Restaging of right upper lobe lung cancer diagnosed in 2021. Right upper lobectomy. Severe right lower quadrant pain for 2 weeks. Status post chemotherapy and immunotherapy. SBRT to left upper lobe lung nodule 12/18/2021. * Tracking Code: BO * EXAM: CT CHEST, ABDOMEN, AND PELVIS WITH CONTRAST TECHNIQUE: Multidetector CT imaging of the chest, abdomen and pelvis was performed following the standard protocol during bolus administration of intravenous contrast. RADIATION DOSE REDUCTION: This exam was performed according to the departmental dose-optimization program which includes automated exposure control, adjustment of the mA and/or kV according to patient size and/or use of iterative reconstruction technique. CONTRAST:  12mL OMNIPAQUE IOHEXOL 300 MG/ML  SOLN COMPARISON:  11/16/2021 FINDINGS: CT CHEST FINDINGS  Cardiovascular: Aortic atherosclerosis. Right Port-A-Cath tip high right atrium. Normal heart size, without pericardial effusion. Lad coronary artery calcification. No central pulmonary embolism, on this non-dedicated study. Mediastinum/Nodes: No supraclavicular adenopathy. No mediastinal or hilar adenopathy. Lungs/Pleura: Similar mild right pleural thickening. Advanced centrilobular emphysema. Right upper lobectomy. Right lower lobe scarring. 4 mm right lower lobe pulmonary nodule on 61/3 is similar to on the prior, new since 04/03/2021. Posterior left upper lobe mixed attenuation nodule of 2.1 x 1.3 cm is similar to 2.4 x 1.4 cm on the prior. The dependent solid component is similar at 7 mm. The solid left upper lobe pulmonary nodule has responded to therapy, 4 mm on 53/3 versus 8 mm on the prior. Musculoskeletal: No acute osseous abnormality. Remote anterolateral upper left rib fractures. CT ABDOMEN PELVIS FINDINGS Hepatobiliary: Bilateral hepatic cysts of up to 1.4 cm are unchanged. Possible low-density gallstone without acute cholecystitis or biliary duct dilatation. Pancreas: Normal, without mass or ductal dilatation. Spleen: Normal in size, without focal abnormality. Adrenals/Urinary Tract: Normal adrenal glands. Right larger than left renal cysts including at up to 4.1 cm . In the absence of clinically indicated signs/symptoms require(s) no independent follow-up. No hydronephrosis. Normal urinary bladder. Stomach/Bowel: Gastric antral underdistention. Scattered colonic diverticula. Colonic stool burden suggests constipation. Normal terminal ileum. Normal small bowel. Vascular/Lymphatic: Aortic atherosclerosis. No abdominopelvic adenopathy. Reproductive: Mild prostatomegaly. Other: No significant free fluid. No evidence of omental or peritoneal disease. Musculoskeletal: No acute osseous abnormality. IMPRESSION: 1. Status post right upper lobectomy. 2. Response to therapy of lateral left upper lobe pulmonary  nodule. 3. No thoracic adenopathy. 4. Similar mixed attenuation left upper lobe pulmonary nodule. A 4 mm solid right lower lobe pulmonary nodule is similar to the prior but new since March. Recommend attention on follow-up. 5. No acute process or evidence of metastatic disease in the abdomen or pelvis. 6. Aortic atherosclerosis (ICD10-I70.0), coronary artery atherosclerosis and emphysema (ICD10-J43.9). 7.  Possible constipation. Electronically Signed   By: Abigail Miyamoto M.D.   On: 02/12/2022 12:18     ASSESSMENT/PLAN:  This is a very pleasant 72 year old Caucasian male with metastatic lung cancer initially diagnosed as stage IIb (T1b, N1, M0) non-small cell lung cancer, adenocarcinoma.  He is status post a right upper lobe lobectomy with lymph node dissection under the care of Dr. Roxan Hockey.  This was performed on June 17, 2019.  He has no actionable mutations.  His PD-L1 expression is 90%. He had evidence for metastatic disease in November 2021 with small right pleural effusion as well as multiple right pleural implants.    The patient completed 3 of the 4 planned adjuvant chemotherapy cycles with cisplatin 75 mg per metered square and Alimta 500 mg/m. This was discontinued after cycle #3 due to intolerance.  When the patient was found to have evidence for disease recurrence, the patient was started on immunotherapy with Libtayo (Cempilimab) 350 mg IV every 3 weeks status post 35 cycles. He recently completed his two years of treatment on 01/22/22.   During the course of his immunotherapy, he did have enlarging nodule for which he received SBRT under the care of Dr. Lisbeth Renshaw on 12/18/21.   The patient recently had a restaging CT scan. Dr. Julien Nordmann personally and independently reviewed the scan and discussed the results with the patient. The scan showed no evidence of disease progression. There is a stable left upper lob nodule which we will continue to monitor. Dr. Julien Nordmann recommends the patient continue on  observation with repeat imaging in 3 months.   I will arrange port-flush appointments very 6-8 weeks.   We will see him back for a follow up visit at that time for evaluation and to review his scan results.   I printed out patient information for our support services at the cancer center for the patient's wife.   The patient was advised to call immediately if she has any concerning symptoms in the interval. The patient voices understanding of current disease status and treatment options and is in agreement with the current care plan. All questions were answered. The patient knows to call the clinic with any problems, questions or concerns. We can certainly see the patient much sooner if necessary        Orders Placed This Encounter  Procedures   CT Chest W Contrast    Standing Status:   Future    Standing Expiration Date:   02/14/2023    Order Specific Question:   If indicated for the ordered procedure, I authorize the administration of contrast media per Radiology protocol    Answer:   Yes    Order Specific Question:   Does the patient have a contrast media/X-ray dye allergy?    Answer:   No    Order Specific Question:   Preferred imaging location?    Answer:   [facility]   CT Abdomen Pelvis W Contrast    Standing Status:   Future    Standing Expiration Date:   02/14/2023    Order Specific Question:   If indicated for the ordered procedure, I authorize the administration of contrast media per Radiology protocol    Answer:   Yes    Order Specific Question:   Does the patient have a contrast media/X-ray dye allergy?    Answer:   No    Order Specific Question:   Preferred imaging location?    Answer:   [facility]    Order Specific Question:   Is Oral Contrast requested for this exam?    Answer:   Yes, Per Radiology protocol   CBC with Differential ([place] Only)    Standing Status:   Future    Standing Expiration Date:   02/15/2023   CMP ([place]  only)    Standing Status:   Future    Standing Expiration Date:   02/15/2023  Alexei Ey L Shaun Runyon, PA-C 02/14/22  ADDENDUM: Hematology/Oncology Attending: I had a face-to-face encounter with the patient today.  I reviewed his record, lab, scan and recommended his care plan.  This is a very pleasant 72 years old white male with metastatic non-small cell lung cancer that was initially diagnosed as stage IIb invasive poorly differentiated carcinoma in June 2021 with disease recurrence in November 2021.  His molecular studies were negative for actionable mutations and he had PD-L1 expression of 90%. The patient is status post several treatment including right upper lobectomy with lymph node dissection as well as adjuvant systemic chemotherapy with cisplatin and Alimta. When he had disease recurrence he was treated with immunotherapy with Libtayo (Cempilimab) 350 Mg IV every 3 weeks status post 2 years of treatment. The patient has tolerated his previous treatment fairly well except for the immunotherapy mediated type 1 diabetes mellitus and he is currently on insulin by endocrinology. He had repeat CT scan of the chest, abdomen and pelvis performed recently.  I personally and independently reviewed the scan and discussed the result with the patient and his wife. His scan showed no concerning findings for disease progression. I recommended for the patient to continue on observation from now on since he completed 2 years of treatment with immunotherapy. The patient and his wife are in agreement with the current plan. If he develop any disease progression in the future, I will discuss with the patient other alternative treatment options. The patient was advised to call immediately if he has any other concerning symptoms in the interval. The total time spent in the appointment was 30 minutes. Disclaimer: This note was dictated with voice recognition software. Similar sounding words can  inadvertently be transcribed and may be missed upon review. Eilleen Kempf, MD

## 2022-02-12 NOTE — Telephone Encounter (Signed)
Called patient regarding upcoming February appointments, patient is notified.

## 2022-02-14 ENCOUNTER — Encounter: Payer: Self-pay | Admitting: Internal Medicine

## 2022-02-14 ENCOUNTER — Ambulatory Visit: Payer: Medicare Other | Admitting: Physician Assistant

## 2022-02-14 ENCOUNTER — Other Ambulatory Visit: Payer: Medicare Other

## 2022-02-14 ENCOUNTER — Inpatient Hospital Stay (HOSPITAL_BASED_OUTPATIENT_CLINIC_OR_DEPARTMENT_OTHER): Payer: Medicare Other | Admitting: Physician Assistant

## 2022-02-14 ENCOUNTER — Inpatient Hospital Stay: Payer: Medicare Other | Attending: Internal Medicine

## 2022-02-14 VITALS — BP 134/72 | HR 82 | Temp 97.6°F | Resp 14 | Wt 135.7 lb

## 2022-02-14 DIAGNOSIS — Z7951 Long term (current) use of inhaled steroids: Secondary | ICD-10-CM | POA: Diagnosis not present

## 2022-02-14 DIAGNOSIS — Z794 Long term (current) use of insulin: Secondary | ICD-10-CM | POA: Insufficient documentation

## 2022-02-14 DIAGNOSIS — C3491 Malignant neoplasm of unspecified part of right bronchus or lung: Secondary | ICD-10-CM

## 2022-02-14 DIAGNOSIS — E109 Type 1 diabetes mellitus without complications: Secondary | ICD-10-CM | POA: Diagnosis not present

## 2022-02-14 DIAGNOSIS — C3411 Malignant neoplasm of upper lobe, right bronchus or lung: Secondary | ICD-10-CM | POA: Insufficient documentation

## 2022-02-14 DIAGNOSIS — R5383 Other fatigue: Secondary | ICD-10-CM

## 2022-02-14 DIAGNOSIS — Z79899 Other long term (current) drug therapy: Secondary | ICD-10-CM | POA: Diagnosis not present

## 2022-02-14 DIAGNOSIS — I10 Essential (primary) hypertension: Secondary | ICD-10-CM | POA: Diagnosis not present

## 2022-02-14 DIAGNOSIS — Z5111 Encounter for antineoplastic chemotherapy: Secondary | ICD-10-CM

## 2022-02-14 DIAGNOSIS — Z452 Encounter for adjustment and management of vascular access device: Secondary | ICD-10-CM | POA: Insufficient documentation

## 2022-02-14 DIAGNOSIS — Z902 Acquired absence of lung [part of]: Secondary | ICD-10-CM | POA: Diagnosis not present

## 2022-02-14 DIAGNOSIS — C3490 Malignant neoplasm of unspecified part of unspecified bronchus or lung: Secondary | ICD-10-CM

## 2022-02-14 DIAGNOSIS — Z9221 Personal history of antineoplastic chemotherapy: Secondary | ICD-10-CM | POA: Insufficient documentation

## 2022-02-14 DIAGNOSIS — Z95828 Presence of other vascular implants and grafts: Secondary | ICD-10-CM | POA: Insufficient documentation

## 2022-02-14 LAB — CBC WITH DIFFERENTIAL (CANCER CENTER ONLY)
Abs Immature Granulocytes: 0.02 10*3/uL (ref 0.00–0.07)
Basophils Absolute: 0.1 10*3/uL (ref 0.0–0.1)
Basophils Relative: 1 %
Eosinophils Absolute: 0.3 10*3/uL (ref 0.0–0.5)
Eosinophils Relative: 5 %
HCT: 41 % (ref 39.0–52.0)
Hemoglobin: 14.9 g/dL (ref 13.0–17.0)
Immature Granulocytes: 0 %
Lymphocytes Relative: 17 %
Lymphs Abs: 1 10*3/uL (ref 0.7–4.0)
MCH: 31.5 pg (ref 26.0–34.0)
MCHC: 36.3 g/dL — ABNORMAL HIGH (ref 30.0–36.0)
MCV: 86.7 fL (ref 80.0–100.0)
Monocytes Absolute: 0.5 10*3/uL (ref 0.1–1.0)
Monocytes Relative: 8 %
Neutro Abs: 4.2 10*3/uL (ref 1.7–7.7)
Neutrophils Relative %: 69 %
Platelet Count: 180 10*3/uL (ref 150–400)
RBC: 4.73 MIL/uL (ref 4.22–5.81)
RDW: 13 % (ref 11.5–15.5)
WBC Count: 6 10*3/uL (ref 4.0–10.5)
nRBC: 0 % (ref 0.0–0.2)

## 2022-02-14 LAB — CMP (CANCER CENTER ONLY)
ALT: 15 U/L (ref 0–44)
AST: 13 U/L — ABNORMAL LOW (ref 15–41)
Albumin: 4 g/dL (ref 3.5–5.0)
Alkaline Phosphatase: 48 U/L (ref 38–126)
Anion gap: 6 (ref 5–15)
BUN: 22 mg/dL (ref 8–23)
CO2: 29 mmol/L (ref 22–32)
Calcium: 9.2 mg/dL (ref 8.9–10.3)
Chloride: 100 mmol/L (ref 98–111)
Creatinine: 1.02 mg/dL (ref 0.61–1.24)
GFR, Estimated: >60 mL/min (ref >60)
Glucose, Bld: 242 mg/dL — ABNORMAL HIGH (ref 70–99)
Potassium: 4.3 mmol/L (ref 3.5–5.1)
Sodium: 135 mmol/L (ref 135–145)
Total Bilirubin: 0.8 mg/dL (ref 0.3–1.2)
Total Protein: 6.2 g/dL — ABNORMAL LOW (ref 6.5–8.1)

## 2022-02-14 LAB — TSH: TSH: 1.825 u[IU]/mL (ref 0.350–4.500)

## 2022-02-14 MED ORDER — HEPARIN SOD (PORK) LOCK FLUSH 100 UNIT/ML IV SOLN
500.0000 [IU] | Freq: Once | INTRAVENOUS | Status: AC
  Administered 2022-02-14: 500 [IU]

## 2022-02-14 MED ORDER — SODIUM CHLORIDE 0.9% FLUSH
10.0000 mL | Freq: Once | INTRAVENOUS | Status: AC
  Administered 2022-02-14: 10 mL

## 2022-02-15 ENCOUNTER — Encounter: Payer: Self-pay | Admitting: Internal Medicine

## 2022-03-07 ENCOUNTER — Telehealth: Payer: Self-pay | Admitting: Radiation Oncology

## 2022-03-07 NOTE — Telephone Encounter (Signed)
I called to let the patient know Dr. Lisbeth Renshaw had reviewed his most recent CT scan and that he favors following the nodule in the LUL, but had to leave him a message.

## 2022-03-29 ENCOUNTER — Other Ambulatory Visit: Payer: Self-pay

## 2022-03-29 ENCOUNTER — Inpatient Hospital Stay: Payer: Medicare Other | Attending: Internal Medicine

## 2022-03-29 ENCOUNTER — Encounter: Payer: Self-pay | Admitting: Internal Medicine

## 2022-03-29 DIAGNOSIS — C3411 Malignant neoplasm of upper lobe, right bronchus or lung: Secondary | ICD-10-CM | POA: Insufficient documentation

## 2022-03-29 DIAGNOSIS — Z5111 Encounter for antineoplastic chemotherapy: Secondary | ICD-10-CM

## 2022-03-29 DIAGNOSIS — Z95828 Presence of other vascular implants and grafts: Secondary | ICD-10-CM

## 2022-03-29 DIAGNOSIS — Z452 Encounter for adjustment and management of vascular access device: Secondary | ICD-10-CM | POA: Diagnosis not present

## 2022-03-29 MED ORDER — SODIUM CHLORIDE 0.9% FLUSH
10.0000 mL | Freq: Once | INTRAVENOUS | Status: AC
  Administered 2022-03-29: 10 mL

## 2022-03-29 MED ORDER — HEPARIN SOD (PORK) LOCK FLUSH 100 UNIT/ML IV SOLN
500.0000 [IU] | Freq: Once | INTRAVENOUS | Status: AC
  Administered 2022-03-29: 500 [IU]

## 2022-04-10 ENCOUNTER — Telehealth: Payer: Self-pay | Admitting: Internal Medicine

## 2022-04-10 NOTE — Telephone Encounter (Signed)
Called patient regarding May appointments, patient is notified.

## 2022-04-29 ENCOUNTER — Other Ambulatory Visit: Payer: Medicare Other

## 2022-05-10 ENCOUNTER — Ambulatory Visit
Admission: RE | Admit: 2022-05-10 | Discharge: 2022-05-10 | Disposition: A | Discharge status: Home / Self Care | Payer: Medicare Other | Source: Ambulatory Visit | Attending: Physician Assistant | Admitting: Physician Assistant

## 2022-05-10 DIAGNOSIS — C3490 Malignant neoplasm of unspecified part of unspecified bronchus or lung: Secondary | ICD-10-CM

## 2022-05-10 DIAGNOSIS — N281 Cyst of kidney, acquired: Secondary | ICD-10-CM | POA: Diagnosis not present

## 2022-05-10 DIAGNOSIS — C801 Malignant (primary) neoplasm, unspecified: Secondary | ICD-10-CM | POA: Diagnosis not present

## 2022-05-10 DIAGNOSIS — J439 Emphysema, unspecified: Secondary | ICD-10-CM | POA: Diagnosis not present

## 2022-05-10 DIAGNOSIS — C7800 Secondary malignant neoplasm of unspecified lung: Secondary | ICD-10-CM | POA: Diagnosis not present

## 2022-05-10 MED ORDER — HEPARIN SOD (PORK) LOCK FLUSH 100 UNIT/ML IV SOLN
500.0000 [IU] | Freq: Once | INTRAVENOUS | Status: DC
  Filled 2022-05-10: qty 5

## 2022-05-10 MED ORDER — IOHEXOL 300 MG/ML  SOLN
100.0000 mL | Freq: Once | INTRAMUSCULAR | Status: AC | PRN
  Administered 2022-05-10: 100 mL via INTRAVENOUS

## 2022-05-10 MED ORDER — HEPARIN SOD (PORK) LOCK FLUSH 100 UNIT/ML IV SOLN
INTRAVENOUS | Status: AC
  Filled 2022-05-10: qty 5

## 2022-05-13 ENCOUNTER — Encounter: Payer: Self-pay | Admitting: Internal Medicine

## 2022-05-14 ENCOUNTER — Inpatient Hospital Stay (HOSPITAL_BASED_OUTPATIENT_CLINIC_OR_DEPARTMENT_OTHER): Payer: Medicare Other | Admitting: Internal Medicine

## 2022-05-14 ENCOUNTER — Other Ambulatory Visit: Payer: Self-pay

## 2022-05-14 ENCOUNTER — Inpatient Hospital Stay: Payer: Medicare Other | Attending: Internal Medicine

## 2022-05-14 VITALS — BP 122/78 | HR 88 | Temp 97.7°F | Resp 16 | Wt 130.3 lb

## 2022-05-14 DIAGNOSIS — Z79899 Other long term (current) drug therapy: Secondary | ICD-10-CM | POA: Diagnosis not present

## 2022-05-14 DIAGNOSIS — C3491 Malignant neoplasm of unspecified part of right bronchus or lung: Secondary | ICD-10-CM

## 2022-05-14 DIAGNOSIS — Z902 Acquired absence of lung [part of]: Secondary | ICD-10-CM | POA: Insufficient documentation

## 2022-05-14 DIAGNOSIS — E109 Type 1 diabetes mellitus without complications: Secondary | ICD-10-CM | POA: Diagnosis not present

## 2022-05-14 DIAGNOSIS — Z95828 Presence of other vascular implants and grafts: Secondary | ICD-10-CM

## 2022-05-14 DIAGNOSIS — C3411 Malignant neoplasm of upper lobe, right bronchus or lung: Secondary | ICD-10-CM | POA: Insufficient documentation

## 2022-05-14 DIAGNOSIS — Z452 Encounter for adjustment and management of vascular access device: Secondary | ICD-10-CM | POA: Insufficient documentation

## 2022-05-14 DIAGNOSIS — Z9221 Personal history of antineoplastic chemotherapy: Secondary | ICD-10-CM | POA: Diagnosis not present

## 2022-05-14 DIAGNOSIS — Z794 Long term (current) use of insulin: Secondary | ICD-10-CM | POA: Diagnosis not present

## 2022-05-14 DIAGNOSIS — C3490 Malignant neoplasm of unspecified part of unspecified bronchus or lung: Secondary | ICD-10-CM | POA: Diagnosis not present

## 2022-05-14 DIAGNOSIS — Z5111 Encounter for antineoplastic chemotherapy: Secondary | ICD-10-CM

## 2022-05-14 LAB — CBC WITH DIFFERENTIAL (CANCER CENTER ONLY)
Abs Immature Granulocytes: 0.03 10*3/uL (ref 0.00–0.07)
Basophils Absolute: 0.1 10*3/uL (ref 0.0–0.1)
Basophils Relative: 1 %
Eosinophils Absolute: 0.3 10*3/uL (ref 0.0–0.5)
Eosinophils Relative: 4 %
HCT: 40.9 % (ref 39.0–52.0)
Hemoglobin: 14.5 g/dL (ref 13.0–17.0)
Immature Granulocytes: 0 %
Lymphocytes Relative: 18 %
Lymphs Abs: 1.3 10*3/uL (ref 0.7–4.0)
MCH: 31.9 pg (ref 26.0–34.0)
MCHC: 35.5 g/dL (ref 30.0–36.0)
MCV: 90.1 fL (ref 80.0–100.0)
Monocytes Absolute: 0.7 10*3/uL (ref 0.1–1.0)
Monocytes Relative: 9 %
Neutro Abs: 5.2 10*3/uL (ref 1.7–7.7)
Neutrophils Relative %: 68 %
Platelet Count: 203 10*3/uL (ref 150–400)
RBC: 4.54 MIL/uL (ref 4.22–5.81)
RDW: 12.9 % (ref 11.5–15.5)
WBC Count: 7.6 10*3/uL (ref 4.0–10.5)
nRBC: 0 % (ref 0.0–0.2)

## 2022-05-14 LAB — CMP (CANCER CENTER ONLY)
ALT: 17 U/L (ref 0–44)
AST: 16 U/L (ref 15–41)
Albumin: 4 g/dL (ref 3.5–5.0)
Alkaline Phosphatase: 56 U/L (ref 38–126)
Anion gap: 7 (ref 5–15)
BUN: 20 mg/dL (ref 8–23)
CO2: 28 mmol/L (ref 22–32)
Calcium: 9.1 mg/dL (ref 8.9–10.3)
Chloride: 100 mmol/L (ref 98–111)
Creatinine: 1.32 mg/dL — ABNORMAL HIGH (ref 0.61–1.24)
GFR, Estimated: 58 mL/min — ABNORMAL LOW (ref >60)
Glucose, Bld: 297 mg/dL — ABNORMAL HIGH (ref 70–99)
Potassium: 4.3 mmol/L (ref 3.5–5.1)
Sodium: 135 mmol/L (ref 135–145)
Total Bilirubin: 0.5 mg/dL (ref 0.3–1.2)
Total Protein: 5.9 g/dL — ABNORMAL LOW (ref 6.5–8.1)

## 2022-05-14 MED ORDER — SODIUM CHLORIDE 0.9% FLUSH
10.0000 mL | INTRAVENOUS | Status: DC | PRN
  Administered 2022-05-14: 10 mL

## 2022-05-14 MED ORDER — HEPARIN SOD (PORK) LOCK FLUSH 100 UNIT/ML IV SOLN
500.0000 [IU] | Freq: Once | INTRAVENOUS | Status: AC
  Administered 2022-05-14: 500 [IU]

## 2022-05-14 NOTE — Progress Notes (Signed)
[facility] [phone]   [phone]  OFFICE PROGRESS NOTE  Center, [facility] [address] [place] [address]  DIAGNOSIS:  1) Metastatic non-small cell lung cancer initially diagnosed as stage IIB (T1b, N1, M0) non-small cell lung cancer, invasive poorly differentiated carcinoma diagnosed in June 2021 with disease recurrence in November 2021 Molecular studies are negative for EGFR mutation as well as ALK gene translocation.  PD-L1 expression was 50-100%.  This is based on the report from the [employer] trial. Molecular studies by [employer]: Showed no actionable mutations and PD-L1 expression was 90% 2) immunotherapy induced type 1 diabetes mellitus.  PRIOR THERAPY:  1) Status post right upper lobectomy with lymph node dissection on June 17, 2019 under the care of Dr. Dorris Fetch. 2) Adjuvant systemic chemotherapy with cisplatin 75 mg/M2 and Alimta 500 mg/M2 every 3 weeks.  First dose September 02, 2019. Status post 3 cycles.  His treatment was discontinued secondary to intolerance. 3) First-line treatment with immunotherapy with Libtayo (Cempilimab) 350 mg IV every 3 weeks status post 35 cycles.  CURRENT THERAPY: Observation  INTERVAL HISTORY: Luis [name] 72 y.o. male returns to the clinic today for follow-up visit accompanied by his wife.  The patient is feeling fine today with no concerning complaints except for mild fatigue.  He denied any chest pain, shortness of breath, cough or hemoptysis.  He has no nausea, vomiting, diarrhea or constipation.  He has no headache or visual changes.  He denied having any recent weight loss or night sweats.  He is currently on observation and feeling fine.  He had repeat CT scan of the chest performed recently and he is here for evaluation and discussion of his scan results.  MEDICAL HISTORY: Past Medical History:  Diagnosis Date   Arthritis    through out body   Chronic bronchitis (HCC)    Concussion     COPD (chronic obstructive pulmonary disease) (HCC)    Diabetes mellitus without complication (HCC)    Dyspnea    Emphysema lung (HCC)    Fatigue    Frozen shoulder    LEFT   GERD (gastroesophageal reflux disease)    Hard of hearing    Headache    alot of days, related to spine issues   Hypertension    Lumbar radiculopathy 2013   nscl ca 04/2019   Peyronie's disease    Prostatitis 2011   Dr. Patsi Sears   RLS (restless legs syndrome)    Shingles 06/13/2018   shingles on back, finished with prednisone, stills has scabs and pain   Thigh pain    Bilateral Thigh   Thigh pain 10/2011   bilateral   Tobacco abuse 2010   still uses electronic cig/ emphysema, SOB easily   Vertigo    per wifes update   Wears partial dentures    upper    ALLERGIES:  is allergic to ativan [lorazepam], doxycycline, and tramadol.  MEDICATIONS:  Current Outpatient Medications  Medication Sig Dispense Refill   acetaminophen (TYLENOL) 500 MG tablet Take 1,000 mg by mouth at bedtime as needed for moderate pain.     albuterol (VENTOLIN HFA) 108 (90 Base) MCG/ACT inhaler INHALE 1-2 PUFFS BY ORAL INHALATION EVERY 4 HOURS FOR CHRONIC OBSTRUCTIVE LUNG DISEASE BE SURE TO WASH MOUTHPIECE WITH WARM WATER ONCE A WEEK     amLODipine (NORVASC) 5 MG tablet TAKE ONE-HALF TABLET BY MOUTH EVERY DAY FOR BLOOD PRESSURE. NOTE NEW MEDICINE  b complex vitamins capsule Take 1 capsule by mouth daily.     Cholecalciferol (VITAMIN D) 125 MCG (5000 UT) CAPS Take 5,000 Units by mouth daily.     citalopram (CELEXA) 20 MG tablet Take 1 tablet by mouth daily.     Continuous Blood Gluc Receiver (DEXCOM G7 RECEIVER) DEVI by Does not apply route.     Continuous Blood Gluc Sensor (DEXCOM G7 SENSOR) MISC by Does not apply route.     fluticasone-salmeterol (ADVAIR) 250-50 MCG/ACT AEPB 1 puff     glucose blood (ACCU-CHEK GUIDE) test strip See admin instructions.     insulin aspart (NOVOLOG FLEXPEN) 100 UNIT/ML FlexPen Max daily 30  units 15 mL 11   insulin glargine (LANTUS SOLOSTAR) 100 UNIT/ML Solostar Pen Inject 10 Units into the skin daily. (Patient taking differently: Inject 8 Units into the skin daily.) 15 mL 6   Insulin Pen Needle 32G X 4 MM MISC 1 Device by Does not apply route in the morning, at noon, in the evening, and at bedtime. 400 each 3   lidocaine-prilocaine (EMLA) cream Apply 1 Application topically as needed. Apply 1 tsp over port site at least 45 minutes prior to lab appointment.Do not rub in cream. Cover with plastic wrap. 30 g 5   losartan (COZAAR) 50 MG tablet TAKE ONE TABLET BY MOUTH EVERY DAY FOR HYPERTENSION     Multiple Vitamin (MULTIVITAMIN) tablet Take 1 tablet by mouth 2 (two) times a week.     vitamin B-12 (CYANOCOBALAMIN) 500 MCG tablet Take 500 mcg by mouth daily.     No current facility-administered medications for this visit.    SURGICAL HISTORY:  Past Surgical History:  Procedure Laterality Date   CATARACT EXTRACTION W/PHACO Right 07/08/2018   Procedure: CATARACT EXTRACTION PHACO AND INTRAOCULAR LENS PLACEMENT (IOC) RIGHT;  Surgeon: Lockie Mola, MD;  Location: [facility];  Service: Ophthalmology;  Laterality: Right;   CATARACT EXTRACTION W/PHACO Left 07/29/2018   Procedure: CATARACT EXTRACTION PHACO AND INTRAOCULAR LENS PLACEMENT (IOC) LEFT TORIC LENS;  Surgeon: Lockie Mola, MD;  Location: [facility];  Service: Ophthalmology;  Laterality: Left;   CHEST TUBE INSERTION Right 07/01/2019   Procedure: CHEST TUBE INSERTION;  Surgeon: Loreli Slot, MD;  Location: [facility] OR;  Service: Thoracic;  Laterality: Right;   COLONOSCOPY     EYE SURGERY     INTERCOSTAL NERVE BLOCK Right 06/17/2019   Procedure: INTERCOSTAL NERVE BLOCK;  Surgeon: Loreli Slot, MD;  Location: [facility] OR;  Service: Thoracic;  Laterality: Right;   INTERCOSTAL NERVE BLOCK Right 07/01/2019   Procedure: INTERCOSTAL NERVE BLOCK;  Surgeon: Loreli Slot, MD;  Location: [facility] OR;   Service: Thoracic;  Laterality: Right;   IR IMAGING GUIDED PORT INSERTION  02/21/2020   TONSILLECTOMY     VIDEO ASSISTED THORACOSCOPY Right 07/01/2019   Procedure: VIDEO ASSISTED THORACOSCOPY - REMOVAL OF RIGHT MIDDLE LOBE BLEBS;  Surgeon: Loreli Slot, MD;  Location: [facility] OR;  Service: Thoracic;  Laterality: Right;   VIDEO BRONCHOSCOPY WITH INSERTION OF INTERBRONCHIAL VALVE (IBV) Right 06/25/2019   Procedure: VIDEO BRONCHOSCOPY WITH INSERTION OF INTERBRONCHIAL VALVE (IBV); Size 7 IBV into Right Loer Lobe (RLL) Superior Segment, Size 9 IBV into RLL Basilar Segment;  Surgeon: Loreli Slot, MD;  Location: [facility] OR;  Service: Thoracic;  Laterality: Right;   VIDEO BRONCHOSCOPY WITH INSERTION OF INTERBRONCHIAL VALVE (IBV) N/A 08/06/2019   Procedure: VIDEO BRONCHOSCOPY WITH REMOVAL OF INTERBRONCHIAL VALVE (IBV);  Surgeon: Loreli Slot, MD;  Location: [facility]  OR;  Service: Thoracic;  Laterality: N/A;    REVIEW OF SYSTEMS:  Constitutional: positive for fatigue Eyes: negative Ears, nose, mouth, throat, and face: negative Respiratory: negative Cardiovascular: negative Gastrointestinal: negative Genitourinary:negative Integument/breast: negative Hematologic/lymphatic: negative Musculoskeletal:negative Neurological: negative Behavioral/Psych: negative Endocrine: negative Allergic/Immunologic: negative   PHYSICAL EXAMINATION: General appearance: alert, cooperative, and no distress Head: Normocephalic, without obvious abnormality, atraumatic Neck: no adenopathy, no JVD, supple, symmetrical, trachea midline, and thyroid not enlarged, symmetric, no tenderness/mass/nodules Lymph nodes: Cervical, supraclavicular, and axillary nodes normal. Resp: clear to auscultation bilaterally Back: symmetric, no curvature. ROM normal. No CVA tenderness. Cardio: regular rate and rhythm, S1, S2 normal, no murmur, click, rub or gallop GI: soft, non-tender; bowel sounds normal; no masses,  no  organomegaly Extremities: extremities normal, atraumatic, no cyanosis or edema Neurologic: Alert and oriented X 3, normal strength and tone. Normal symmetric reflexes. Normal coordination and gait  ECOG PERFORMANCE STATUS: 1 - Symptomatic but completely ambulatory  Blood pressure 122/78, pulse 88, temperature 97.7 F (36.5 C), temperature source Temporal, resp. rate 16, weight 130 lb 4.8 oz (59.1 kg), SpO2 96 %.  LABORATORY DATA: Lab Results  Component Value Date   WBC 7.6 05/14/2022   HGB 14.5 05/14/2022   HCT 40.9 05/14/2022   MCV 90.1 05/14/2022   PLT 203 05/14/2022      Chemistry      Component Value Date/Time   NA 135 02/14/2022 0915   K 4.3 02/14/2022 0915   CL 100 02/14/2022 0915   CO2 29 02/14/2022 0915   BUN 22 02/14/2022 0915   CREATININE 1.02 02/14/2022 0915      Component Value Date/Time   CALCIUM 9.2 02/14/2022 0915   ALKPHOS 48 02/14/2022 0915   AST 13 (L) 02/14/2022 0915   ALT 15 02/14/2022 0915   BILITOT 0.8 02/14/2022 0915       RADIOGRAPHIC STUDIES: CT Chest W Contrast  Result Date: 05/14/2022 CLINICAL DATA:  Metastatic disease non-small-cell lung cancer. * Tracking Code: BO * EXAM: CT CHEST, ABDOMEN, AND PELVIS WITH CONTRAST TECHNIQUE: Multidetector CT imaging of the chest, abdomen and pelvis was performed following the standard protocol during bolus administration of intravenous contrast. RADIATION DOSE REDUCTION: This exam was performed according to the departmental dose-optimization program which includes automated exposure control, adjustment of the mA and/or kV according to patient size and/or use of iterative reconstruction technique. CONTRAST:  OMNIPAQUE IOHEXOL 300 MG/ML  SOLN COMPARISON:  CT 02/12/2022 and older FINDINGS: CT CHEST FINDINGS Cardiovascular: Heart is nonenlarged. No pericardial effusion. The thoracic aorta has a normal course and caliber with scattered vascular calcifications. Right upper chest port. The port is accessed.  Mediastinum/Nodes: Normal caliber thoracic esophagus. Preserved thyroid gland. No specific abnormal lymph node enlargement identified in the axillary regions, hilum or mediastinum. Lungs/Pleura: Emphysematous lung changes are identified. The left lung is without consolidation, pneumothorax or effusion. There is some dependent left upper lobe opacities identified with some interstitial septal thickening, slightly increased from previous. However previous confluent ill-defined areas in the left upper lobe which measured 2.1 x 1.3 cm, today measures 1.9 by 1.1 cm. There is a new 3 mm nodule left upper lobe series 3, image 52. Surgical changes on the right side from lobectomy. Areas of scarring and atelectatic changes. Associated pleural thickening of the right lung base with a tiny component of the effusion. No significant change. The small nodule in the right lower lobe measuring 3 mm is stable today on series 3, image 58. Musculoskeletal: Scattered degenerative changes  along the spine. CT ABDOMEN PELVIS FINDINGS Hepatobiliary: Stable dome segment 2 hepatic cyst. Smaller focus as well medial right lower lobe on series 2, image 61 is stable. Patent portal vein. Gallbladder is nondilated. Pancreas: Unremarkable. No pancreatic ductal dilatation or surrounding inflammatory changes. Spleen: Normal in size without focal abnormality. Adrenals/Urinary Tract: Multiple splenic granulomas. The adrenal glands are slightly thickened bilaterally and unchanged from previous. Bilateral stable Bosniak 1 renal cysts. No specific imaging follow-up. No collecting system dilatation or enhancing renal mass. Preserved contours of the urinary bladder. Stomach/Bowel: Sigmoid colon diverticulosis. Moderate colonic stool. Stomach and small bowel are nondilated. No free air or free fluid. Vascular/Lymphatic: Scattered vascular calcifications. Normal caliber aorta and IVC. No specific abnormal lymph node enlargement identified in the abdomen and  pelvis. Reproductive: Mildly enlarged prostate. Mass effect along the bladder. Other: No free air or free fluid.  Trace anasarca. Musculoskeletal: No acute or significant osseous findings. IMPRESSION: Surgical changes of right upper lobectomy. Slight increase in ill-defined thickening and ground-glass in the dependent left upper lobe with new tiny lung nodule. Recommend follow up in 3 months. Other areas are similar to previous. No developing new mass lesion or lymph node enlargement in the chest, abdomen and pelvis. Electronically Signed   By: Karen Kays M.D.   On: 05/14/2022 11:43   CT Abdomen Pelvis W Contrast  Result Date: 05/14/2022 CLINICAL DATA:  Metastatic disease non-small-cell lung cancer. * Tracking Code: BO * EXAM: CT CHEST, ABDOMEN, AND PELVIS WITH CONTRAST TECHNIQUE: Multidetector CT imaging of the chest, abdomen and pelvis was performed following the standard protocol during bolus administration of intravenous contrast. RADIATION DOSE REDUCTION: This exam was performed according to the departmental dose-optimization program which includes automated exposure control, adjustment of the mA and/or kV according to patient size and/or use of iterative reconstruction technique. CONTRAST:  OMNIPAQUE IOHEXOL 300 MG/ML  SOLN COMPARISON:  CT 02/12/2022 and older FINDINGS: CT CHEST FINDINGS Cardiovascular: Heart is nonenlarged. No pericardial effusion. The thoracic aorta has a normal course and caliber with scattered vascular calcifications. Right upper chest port. The port is accessed. Mediastinum/Nodes: Normal caliber thoracic esophagus. Preserved thyroid gland. No specific abnormal lymph node enlargement identified in the axillary regions, hilum or mediastinum. Lungs/Pleura: Emphysematous lung changes are identified. The left lung is without consolidation, pneumothorax or effusion. There is some dependent left upper lobe opacities identified with some interstitial septal thickening, slightly  increased from previous. However previous confluent ill-defined areas in the left upper lobe which measured 2.1 x 1.3 cm, today measures 1.9 by 1.1 cm. There is a new 3 mm nodule left upper lobe series 3, image 52. Surgical changes on the right side from lobectomy. Areas of scarring and atelectatic changes. Associated pleural thickening of the right lung base with a tiny component of the effusion. No significant change. The small nodule in the right lower lobe measuring 3 mm is stable today on series 3, image 58. Musculoskeletal: Scattered degenerative changes along the spine. CT ABDOMEN PELVIS FINDINGS Hepatobiliary: Stable dome segment 2 hepatic cyst. Smaller focus as well medial right lower lobe on series 2, image 61 is stable. Patent portal vein. Gallbladder is nondilated. Pancreas: Unremarkable. No pancreatic ductal dilatation or surrounding inflammatory changes. Spleen: Normal in size without focal abnormality. Adrenals/Urinary Tract: Multiple splenic granulomas. The adrenal glands are slightly thickened bilaterally and unchanged from previous. Bilateral stable Bosniak 1 renal cysts. No specific imaging follow-up. No collecting system dilatation or enhancing renal mass. Preserved contours of the urinary bladder. Stomach/Bowel:  Sigmoid colon diverticulosis. Moderate colonic stool. Stomach and small bowel are nondilated. No free air or free fluid. Vascular/Lymphatic: Scattered vascular calcifications. Normal caliber aorta and IVC. No specific abnormal lymph node enlargement identified in the abdomen and pelvis. Reproductive: Mildly enlarged prostate. Mass effect along the bladder. Other: No free air or free fluid.  Trace anasarca. Musculoskeletal: No acute or significant osseous findings. IMPRESSION: Surgical changes of right upper lobectomy. Slight increase in ill-defined thickening and ground-glass in the dependent left upper lobe with new tiny lung nodule. Recommend follow up in 3 months. Other areas are  similar to previous. No developing new mass lesion or lymph node enlargement in the chest, abdomen and pelvis. Electronically Signed   By: Karen Kays M.D.   On: 05/14/2022 11:43    ASSESSMENT AND PLAN: This is a very pleasant 72 years old white male recently diagnosed with stage IIb (T1b, N1, M0) non-small cell lung cancer, adenocarcinoma status post right upper lobectomy with lymph node dissection under the care of Dr. Dorris Fetch on June 17, 2019. The patient had molecular studies on the [employer] clinical trial that showed negative for EGFR mutation as well as ALK gene translocation but PD-L1 expression in the range of 50-100%. The patient underwent standard adjuvant systemic chemotherapy with cisplatin 75 mg/M2 and Alimta 500 mg/M2 every 3 weeks status post 3 cycles.  His treatment was discontinued secondary to intolerance. Unfortunately repeat CT scan as well as PET scan after completion of the adjuvant chemotherapy showed concerning findings for disease recurrence with small right pleural effusion as well as multiple pleural implants consistent with metastatic disease which was confirmed with repeat biopsy. He had molecular studies by [employer] and the blood test showed no actionable mutation.  His PD-L1 expression was 90%. The patient underwent treatment with immunotherapy with Libtayo (Cempilimab) 350 mg IV every 3 weeks status post 35 cycles. He tolerated his treatment fairly well with no concerning adverse effects. The patient is currently on observation and feeling fine except for mild fatigue. He had repeat CT scan of the chest performed recently.  I personally and independently reviewed the scan and discussed results with the patient and his wife. His scan showed no concerning findings for disease progression except for slight increase in the ill-defined thickening and groundglass in the dependent left upper lobe with new tiny lung nodule. I recommended for the patient to continue on  observation with repeat CT scan of the chest in 3 months. For the immunotherapy mediated type 1 diabetes mellitus, he is followed by endocrinology and currently on treatment with insulin. The patient was advised to call immediately if he has any concerning symptoms in the interval.  The patient voices understanding of current disease status and treatment options and is in agreement with the current care plan.  All questions were answered. The patient knows to call the clinic with any problems, questions or concerns. We can certainly see the patient much sooner if necessary.  Disclaimer: This note was dictated with voice recognition software. Similar sounding words can inadvertently be transcribed and may not be corrected upon review.

## 2022-05-15 ENCOUNTER — Inpatient Hospital Stay: Payer: Medicare Other

## 2022-05-17 DIAGNOSIS — Z961 Presence of intraocular lens: Secondary | ICD-10-CM | POA: Diagnosis not present

## 2022-05-17 DIAGNOSIS — H209 Unspecified iridocyclitis: Secondary | ICD-10-CM | POA: Diagnosis not present

## 2022-05-17 DIAGNOSIS — E109 Type 1 diabetes mellitus without complications: Secondary | ICD-10-CM | POA: Diagnosis not present

## 2022-05-20 ENCOUNTER — Ambulatory Visit: Payer: Medicare Other | Admitting: Internal Medicine

## 2022-06-09 ENCOUNTER — Ambulatory Visit
Admission: RE | Admit: 2022-06-09 | Discharge: 2022-06-09 | Disposition: A | Discharge status: Home / Self Care | Payer: Medicare Other | Source: Ambulatory Visit | Attending: Emergency Medicine | Admitting: Emergency Medicine

## 2022-06-09 VITALS — BP 115/78 | HR 95 | Temp 98.5°F | Resp 22 | Wt 128.0 lb

## 2022-06-09 DIAGNOSIS — J441 Chronic obstructive pulmonary disease with (acute) exacerbation: Secondary | ICD-10-CM

## 2022-06-09 DIAGNOSIS — J209 Acute bronchitis, unspecified: Secondary | ICD-10-CM | POA: Diagnosis not present

## 2022-06-09 MED ORDER — AMOXICILLIN-POT CLAVULANATE 875-125 MG PO TABS: 1.0000 | ORAL_TABLET | Freq: Two times a day (BID) | ORAL | 0 refills | Status: DC

## 2022-06-09 MED ORDER — AEROCHAMBER MV MISC: 1 refills | Status: DC

## 2022-06-09 MED ORDER — FLUTICASONE PROPIONATE 50 MCG/ACT NA SUSP: 2.0000 | Freq: Every day | NASAL | 0 refills | Status: DC

## 2022-06-09 NOTE — ED Triage Notes (Signed)
Sore throat x 4 days, coughing up yellow sputum...feels bad, taking mucinex. Hx of lung cancer

## 2022-06-09 NOTE — ED Provider Notes (Signed)
HPI  SUBJECTIVE:  Luis [name] is a 72 y.o. male who presents with cough productive of thick, foul tasting yellow mucus for the past week.  He states that this is new and different for him.  He reports nasal congestion, rhinorrhea, postnasal drip, intermittent sore throat, wheezing, worsening shortness of breath and dyspnea on exertion.  No fevers, body aches, headaches, nausea, vomiting, diarrhea, abdominal pain.  He is able to sleep at night without waking up coughing frequently.  No known COVID or flu exposure.  He got 2 doses of the COVID-vaccine and this years flu vaccine.  He states this is identical to previous episodes of bronchitis.  No he has tried rest, Tylenol, Mucinex and albuterol with improvement in his symptoms.  No aggravating factors.  No antibiotics in the past 3 months.  No antipyretic in the past 6 hours.  He has a medical history of type 1 diabetes, COPD, lung cancer status postresection and chemotherapy, hypertension.  Denies history of chronic kidney disease.  [facility].   Past Medical History:  Diagnosis Date   Arthritis    through out body   Chronic bronchitis (HCC)    Concussion    COPD (chronic obstructive pulmonary disease) (HCC)    Diabetes mellitus without complication (HCC)    Dyspnea    Emphysema lung (HCC)    Fatigue    Frozen shoulder    LEFT   GERD (gastroesophageal reflux disease)    Hard of hearing    Headache    alot of days, related to spine issues   Hypertension    Lumbar radiculopathy 2013   nscl ca 04/2019   Peyronie's disease    Prostatitis 2011   Dr. Patsi Sears   RLS (restless legs syndrome)    Shingles 06/13/2018   shingles on back, finished with prednisone, stills has scabs and pain   Thigh pain    Bilateral Thigh   Thigh pain 10/2011   bilateral   Tobacco abuse 2010   still uses electronic cig/ emphysema, SOB easily   Vertigo    per wifes update   Wears partial dentures    upper    Past Surgical History:  Procedure  Laterality Date   CATARACT EXTRACTION W/PHACO Right 07/08/2018   Procedure: CATARACT EXTRACTION PHACO AND INTRAOCULAR LENS PLACEMENT (IOC) RIGHT;  Surgeon: Lockie Mola, MD;  Location: [facility];  Service: Ophthalmology;  Laterality: Right;   CATARACT EXTRACTION W/PHACO Left 07/29/2018   Procedure: CATARACT EXTRACTION PHACO AND INTRAOCULAR LENS PLACEMENT (IOC) LEFT TORIC LENS;  Surgeon: Lockie Mola, MD;  Location: [facility];  Service: Ophthalmology;  Laterality: Left;   CHEST TUBE INSERTION Right 07/01/2019   Procedure: CHEST TUBE INSERTION;  Surgeon: Loreli Slot, MD;  Location: [facility] OR;  Service: Thoracic;  Laterality: Right;   COLONOSCOPY     EYE SURGERY     INTERCOSTAL NERVE BLOCK Right 06/17/2019   Procedure: INTERCOSTAL NERVE BLOCK;  Surgeon: Loreli Slot, MD;  Location: [facility] OR;  Service: Thoracic;  Laterality: Right;   INTERCOSTAL NERVE BLOCK Right 07/01/2019   Procedure: INTERCOSTAL NERVE BLOCK;  Surgeon: Loreli Slot, MD;  Location: [facility] OR;  Service: Thoracic;  Laterality: Right;   IR IMAGING GUIDED PORT INSERTION  02/21/2020   TONSILLECTOMY     VIDEO ASSISTED THORACOSCOPY Right 07/01/2019   Procedure: VIDEO ASSISTED THORACOSCOPY - REMOVAL OF RIGHT MIDDLE LOBE BLEBS;  Surgeon: Loreli Slot, MD;  Location: [facility] OR;  Service: Thoracic;  Laterality: Right;  VIDEO BRONCHOSCOPY WITH INSERTION OF INTERBRONCHIAL VALVE (IBV) Right 06/25/2019   Procedure: VIDEO BRONCHOSCOPY WITH INSERTION OF INTERBRONCHIAL VALVE (IBV); Size 7 IBV into Right Loer Lobe (RLL) Superior Segment, Size 9 IBV into RLL Basilar Segment;  Surgeon: Loreli Slot, MD;  Location: [facility] OR;  Service: Thoracic;  Laterality: Right;   VIDEO BRONCHOSCOPY WITH INSERTION OF INTERBRONCHIAL VALVE (IBV) N/A 08/06/2019   Procedure: VIDEO BRONCHOSCOPY WITH REMOVAL OF INTERBRONCHIAL VALVE (IBV);  Surgeon: Loreli Slot, MD;  Location: [facility] OR;  Service: Thoracic;   Laterality: N/A;    Family History  Problem Relation Age of Onset   Diabetes Mother    Hyperlipidemia Father    Hypertension Father     Social History   Tobacco Use   Smoking status: Former    Packs/day: 2.00    Years: 40.00    Additional pack years: 0.00    Total pack years: 80.00    Types: Cigarettes    Quit date: 01/14/2009    Years since quitting: 13.4   Smokeless tobacco: Former  Building services engineer Use: Every day   Start date: 01/15/1999   Substances: Nicotine, Flavoring  Substance Use Topics   Alcohol use: Yes    Alcohol/week: 12.0 standard drinks of alcohol    Types: 12 Cans of beer per week    Comment: on the week-ends   Drug use: No    No current facility-administered medications for this encounter.  Current Outpatient Medications:    acetaminophen (TYLENOL) 500 MG tablet, Take 1,000 mg by mouth at bedtime as needed for moderate pain., Disp: , Rfl:    albuterol (VENTOLIN HFA) 108 (90 Base) MCG/ACT inhaler, INHALE 1-2 PUFFS BY ORAL INHALATION EVERY 4 HOURS FOR CHRONIC OBSTRUCTIVE LUNG DISEASE BE SURE TO WASH MOUTHPIECE WITH WARM WATER ONCE A WEEK, Disp: , Rfl:    amLODipine (NORVASC) 5 MG tablet, TAKE ONE-HALF TABLET BY MOUTH EVERY DAY FOR BLOOD PRESSURE. NOTE NEW MEDICINE, Disp: , Rfl:    amoxicillin-clavulanate (AUGMENTIN) 875-125 MG tablet, Take 1 tablet by mouth every 12 (twelve) hours., Disp: 14 tablet, Rfl: 0   b complex vitamins capsule, Take 1 capsule by mouth daily., Disp: , Rfl:    Cholecalciferol (VITAMIN D) 125 MCG (5000 UT) CAPS, Take 5,000 Units by mouth daily., Disp: , Rfl:    citalopram (CELEXA) 20 MG tablet, Take 1 tablet by mouth daily., Disp: , Rfl:    Continuous Blood Gluc Receiver (DEXCOM G7 RECEIVER) DEVI, by Does not apply route., Disp: , Rfl:    Continuous Blood Gluc Sensor (DEXCOM G7 SENSOR) MISC, by Does not apply route., Disp: , Rfl:    fluticasone (FLONASE) 50 MCG/ACT nasal spray, Place 2 sprays into both nostrils daily., Disp: 16 g, Rfl:  0   fluticasone-salmeterol (ADVAIR) 250-50 MCG/ACT AEPB, 1 puff, Disp: , Rfl:    glucose blood (ACCU-CHEK GUIDE) test strip, See admin instructions., Disp: , Rfl:    insulin aspart (NOVOLOG FLEXPEN) 100 UNIT/ML FlexPen, Max daily 30 units, Disp: 15 mL, Rfl: 11   insulin glargine (LANTUS SOLOSTAR) 100 UNIT/ML Solostar Pen, Inject 10 Units into the skin daily. (Patient taking differently: Inject 8 Units into the skin daily.), Disp: 15 mL, Rfl: 6   Insulin Pen Needle 32G X 4 MM MISC, 1 Device by Does not apply route in the morning, at noon, in the evening, and at bedtime., Disp: 400 each, Rfl: 3   lidocaine-prilocaine (EMLA) cream, Apply 1 Application topically as needed. Apply 1 tsp over  port site at least 45 minutes prior to lab appointment.Do not rub in cream. Cover with plastic wrap., Disp: 30 g, Rfl: 5   losartan (COZAAR) 50 MG tablet, TAKE ONE TABLET BY MOUTH EVERY DAY FOR HYPERTENSION, Disp: , Rfl:    Multiple Vitamin (MULTIVITAMIN) tablet, Take 1 tablet by mouth 2 (two) times a week., Disp: , Rfl:    Spacer/Aero-Holding Chambers (AEROCHAMBER MV) inhaler, Use as instructed, Disp: 1 each, Rfl: 1   vitamin B-12 (CYANOCOBALAMIN) 500 MCG tablet, Take 500 mcg by mouth daily., Disp: , Rfl:   Allergies  Allergen Reactions   Ativan [Lorazepam] Other (See Comments)    hallucinations   Doxycycline Itching and Other (See Comments)    Itching/ redness   Tramadol Other (See Comments)    Hallucination     ROS  As noted in HPI.   Physical Exam  BP 115/78 (BP Location: Left Arm)   Pulse 95   Temp 98.5 F (36.9 C) (Oral)   Resp (!) 22   Wt 58.1 kg   SpO2 94%   BMI 20.66 kg/m   Constitutional: Well developed, well nourished, no acute distress Eyes:  EOMI, conjunctiva normal bilaterally HENT: Normocephalic, atraumatic,mucus membranes moist.  Mild nasal congestion.  No maxillary, frontal sinus tenderness. Respiratory: Normal inspiratory effort, lungs clear bilaterally, good air  movement. Cardiovascular: Normal rate, regular rhythm, no murmurs rubs or gallops GI: nondistended skin: No rash, skin intact Musculoskeletal: no deformities Neurologic: Alert & oriented x 3, no focal neuro deficits Psychiatric: Speech and behavior appropriate   ED Course   Medications - No data to display  No orders of the defined types were placed in this encounter.   No results found for this or any previous visit (from the past 24 hour(s)). No results found.  ED Clinical Impression  1. COPD exacerbation (HCC)   2. Acute bronchitis, unspecified organism      ED Assessment/Plan    Patient presents with a COPD exacerbation/bronchitis.  Home with Augmentin, regularly scheduled albuterol with a spacer for the next 4 days, and then as needed thereafter.  Will prescribe a spacer for his albuterol inhaler.  He has enough albuterol at home and does not need a prescription.  Deferring oral steroids secondary to the diabetes type 1 and his lungs are clear.  Patient declined cough syrup.  He also declined COVID testing.  Continue Mucinex, saline nasal irrigation, Flonase.  Follow-up with PCP as needed.  Discussed  MDM, treatment plan, and plan for follow-up with patient. . patient agrees with plan.   Meds ordered this encounter  Medications   amoxicillin-clavulanate (AUGMENTIN) 875-125 MG tablet    Sig: Take 1 tablet by mouth every 12 (twelve) hours.    Dispense:  14 tablet    Refill:  0   Spacer/Aero-Holding Chambers (AEROCHAMBER MV) inhaler    Sig: Use as instructed    Dispense:  1 each    Refill:  1   fluticasone (FLONASE) 50 MCG/ACT nasal spray    Sig: Place 2 sprays into both nostrils daily.    Dispense:  16 g    Refill:  0      *This clinic note was created using Scientist, clinical (histocompatibility and immunogenetics). Therefore, there may be occasional mistakes despite careful proofreading.  ?    Domenick Gong, MD 06/09/22 1203

## 2022-06-09 NOTE — Discharge Instructions (Addendum)
Finish the Augmentin, even if you feel better.  2 puffs from your albuterol inhaler using your spacer every 4 hours for 2 days, then every 6 hours for 2 days, then as needed.  You can back off on the albuterol if you start to improve sooner.  If the spacer is too expensive, you can order one off of [employer] for about $20.  Continue Mucinex, start Flonase.  Can try saline nasal irrigation with a Lloyd Huger Med rinse and distilled water as often as you want for nasal congestion and postnasal drip.  Follow-up with your primary care provider at the [facility] if you are not getting any better, go to the ER if you get worse.

## 2022-07-23 ENCOUNTER — Encounter: Payer: Self-pay | Admitting: Internal Medicine

## 2022-07-25 ENCOUNTER — Telehealth: Payer: Self-pay | Admitting: Medical Oncology

## 2022-07-25 NOTE — Telephone Encounter (Signed)
CT angio done at [facility] on Monday -Wife concerned about " 10 mm nodule " result on CT angio report . I reviewed the report on Care Everywhere and told her there is no mention of a 10 mm nodule. She said her dtr gave her this information.  I will cancel CT chest.  CT C/A/P scheduled for 7/26- I told her to keep that appt and f/u with [facility].

## 2022-07-26 ENCOUNTER — Telehealth: Payer: Self-pay | Admitting: Medical Oncology

## 2022-07-27 ENCOUNTER — Ambulatory Visit
Admission: RE | Admit: 2022-07-27 | Discharge: 2022-07-27 | Disposition: A | Discharge status: Home / Self Care | Payer: Medicare Other | Source: Ambulatory Visit | Attending: Emergency Medicine | Admitting: Emergency Medicine

## 2022-07-27 ENCOUNTER — Ambulatory Visit (INDEPENDENT_AMBULATORY_CARE_PROVIDER_SITE_OTHER): Payer: Medicare Other

## 2022-07-27 VITALS — BP 108/68 | HR 90 | Temp 98.1°F | Resp 16 | Ht 66.0 in | Wt 121.0 lb

## 2022-07-27 DIAGNOSIS — J984 Other disorders of lung: Secondary | ICD-10-CM | POA: Diagnosis not present

## 2022-07-27 DIAGNOSIS — R059 Cough, unspecified: Secondary | ICD-10-CM | POA: Diagnosis not present

## 2022-07-27 DIAGNOSIS — R0602 Shortness of breath: Secondary | ICD-10-CM

## 2022-07-27 MED ORDER — PREDNISONE 20 MG PO TABS: 60.0000 mg | ORAL_TABLET | Freq: Every day | ORAL | 0 refills | Status: AC

## 2022-07-27 MED ORDER — LEVOFLOXACIN 500 MG PO TABS: 500.0000 mg | ORAL_TABLET | Freq: Every day | ORAL | 0 refills | Status: DC

## 2022-07-27 NOTE — ED Provider Notes (Signed)
[facility]    CSN: [id number] Arrival date & time: 07/27/22  1143      History   Chief Complaint Chief Complaint  Patient presents with   Medication Refill    Appt    HPI Luis [name] is a 72 y.o. male.   HPI  72 year old male with a past medical history significant for lung cancer, COPD Gold 3 with reversibility, type 1 diabetes, hypertension, GERD, and RLS presents for evaluation of 6 weeks worth of shortness of breath with a nonproductive cough.  He states in the last 2 to 3 weeks it has worsened.  He was evaluated in this urgent care on 06/09/2022 and diagnosed with a COPD exacerbation and treated with Augmentin.  He was subsequently seen in the emergency department at the [facility] on 07/22/2022 where he had a CT scan which showed groundglass opacities in the left upper lobe which may represent a new neoplasm or atypical infection.  He was discharged home on a second round of Augmentin with added azithromycin.  He reports that his symptoms have not improved.  He is also been experiencing night sweats and has had a 10 pound weight loss in the last 2 weeks.  Past Medical History:  Diagnosis Date   Arthritis    through out body   Chronic bronchitis (HCC)    Concussion    COPD (chronic obstructive pulmonary disease) (HCC)    Diabetes mellitus without complication (HCC)    Dyspnea    Emphysema lung (HCC)    Fatigue    Frozen shoulder    LEFT   GERD (gastroesophageal reflux disease)    Hard of hearing    Headache    alot of days, related to spine issues   Hypertension    Lumbar radiculopathy 2013   nscl ca 04/2019   Peyronie's disease    Prostatitis 2011   Dr. Patsi Sears   RLS (restless legs syndrome)    Shingles 06/13/2018   shingles on back, finished with prednisone, stills has scabs and pain   Thigh pain    Bilateral Thigh   Thigh pain 10/2011   bilateral   Tobacco abuse 2010   still uses electronic cig/ emphysema, SOB easily   Vertigo    per  wifes update   Wears partial dentures    upper    Patient Active Problem List   Diagnosis Date Noted   Port-A-Cath in place 02/14/2022   Type 1 diabetes mellitus with hyperglycemia (HCC) 04/26/2021   Type 1 diabetes mellitus with diabetic polyneuropathy (HCC) 04/26/2021   Right hip pain 11/29/2020   Diarrhea 07/25/2020   Dehydration 07/25/2020   Encounter for antineoplastic immunotherapy 01/17/2020   Encounter for antineoplastic chemotherapy 08/26/2019   Recurrent adenocarcinoma of lung (HCC) 08/03/2019   Goals of care, counseling/discussion 08/03/2019   S/P lobectomy of lung 06/17/2019   COPD GOLD 3 with reversibility 05/06/2019   Solitary pulmonary nodule on lung CT 05/06/2019   Atherosclerosis of native arteries of the extremities with intermittent claudication 07/13/2012   Pain in limb-Bilateral thigh 07/13/2012   Tingling-Bilateral leg 07/13/2012    Past Surgical History:  Procedure Laterality Date   CATARACT EXTRACTION W/PHACO Right 07/08/2018   Procedure: CATARACT EXTRACTION PHACO AND INTRAOCULAR LENS PLACEMENT (IOC) RIGHT;  Surgeon: Lockie Mola, MD;  Location: [facility];  Service: Ophthalmology;  Laterality: Right;   CATARACT EXTRACTION W/PHACO Left 07/29/2018   Procedure: CATARACT EXTRACTION PHACO AND INTRAOCULAR LENS PLACEMENT (IOC) LEFT TORIC LENS;  Surgeon:  Lockie Mola, MD;  Location: [facility];  Service: Ophthalmology;  Laterality: Left;   CHEST TUBE INSERTION Right 07/01/2019   Procedure: CHEST TUBE INSERTION;  Surgeon: Loreli Slot, MD;  Location: [facility] OR;  Service: Thoracic;  Laterality: Right;   COLONOSCOPY     EYE SURGERY     INTERCOSTAL NERVE BLOCK Right 06/17/2019   Procedure: INTERCOSTAL NERVE BLOCK;  Surgeon: Loreli Slot, MD;  Location: [facility] OR;  Service: Thoracic;  Laterality: Right;   INTERCOSTAL NERVE BLOCK Right 07/01/2019   Procedure: INTERCOSTAL NERVE BLOCK;  Surgeon: Loreli Slot, MD;  Location:  [facility] OR;  Service: Thoracic;  Laterality: Right;   IR IMAGING GUIDED PORT INSERTION  02/21/2020   TONSILLECTOMY     VIDEO ASSISTED THORACOSCOPY Right 07/01/2019   Procedure: VIDEO ASSISTED THORACOSCOPY - REMOVAL OF RIGHT MIDDLE LOBE BLEBS;  Surgeon: Loreli Slot, MD;  Location: [facility] OR;  Service: Thoracic;  Laterality: Right;   VIDEO BRONCHOSCOPY WITH INSERTION OF INTERBRONCHIAL VALVE (IBV) Right 06/25/2019   Procedure: VIDEO BRONCHOSCOPY WITH INSERTION OF INTERBRONCHIAL VALVE (IBV); Size 7 IBV into Right Loer Lobe (RLL) Superior Segment, Size 9 IBV into RLL Basilar Segment;  Surgeon: Loreli Slot, MD;  Location: [facility] OR;  Service: Thoracic;  Laterality: Right;   VIDEO BRONCHOSCOPY WITH INSERTION OF INTERBRONCHIAL VALVE (IBV) N/A 08/06/2019   Procedure: VIDEO BRONCHOSCOPY WITH REMOVAL OF INTERBRONCHIAL VALVE (IBV);  Surgeon: Loreli Slot, MD;  Location: [facility] OR;  Service: Thoracic;  Laterality: N/A;       Home Medications    Prior to Admission medications   Medication Sig Start Date End Date Taking? Authorizing Provider  acetaminophen (TYLENOL) 500 MG tablet Take 1,000 mg by mouth at bedtime as needed for moderate pain.   Yes [provider]  albuterol (VENTOLIN HFA) 108 (90 Base) MCG/ACT inhaler INHALE 1-2 PUFFS BY ORAL INHALATION EVERY 4 HOURS FOR CHRONIC OBSTRUCTIVE LUNG DISEASE BE SURE TO WASH MOUTHPIECE WITH WARM WATER ONCE A WEEK 05/23/21  Yes [provider]  amLODipine (NORVASC) 5 MG tablet TAKE ONE-HALF TABLET BY MOUTH EVERY DAY FOR BLOOD PRESSURE. NOTE NEW MEDICINE 05/29/21  Yes [provider]  amoxicillin-clavulanate (AUGMENTIN) 875-125 MG tablet Take 1 tablet by mouth every 12 (twelve) hours. 06/09/22  Yes Domenick Gong, MD  b complex vitamins capsule Take 1 capsule by mouth daily.   Yes [provider]  Cholecalciferol (VITAMIN D) 125 MCG (5000 UT) CAPS Take 5,000 Units by mouth daily.   Yes [provider]  citalopram  (CELEXA) 20 MG tablet Take 1 tablet by mouth daily.   Yes [provider]  Continuous Blood Gluc Receiver (DEXCOM G7 RECEIVER) DEVI by Does not apply route.   Yes [provider]  Continuous Blood Gluc Sensor (DEXCOM G7 SENSOR) MISC by Does not apply route.   Yes [provider]  fluticasone (FLONASE) 50 MCG/ACT nasal spray Place 2 sprays into both nostrils daily. 06/09/22  Yes Domenick Gong, MD  fluticasone-salmeterol (ADVAIR) 250-50 MCG/ACT AEPB 1 puff   Yes [provider]  glucose blood (ACCU-CHEK GUIDE) test strip See admin instructions. 04/25/21  Yes [provider]  insulin aspart (NOVOLOG FLEXPEN) 100 UNIT/ML FlexPen Max daily 30 units 08/06/21  Yes Shamleffer, Konrad Dolores, MD  insulin glargine (LANTUS SOLOSTAR) 100 UNIT/ML Solostar Pen Inject 10 Units into the skin daily. Patient taking differently: Inject 8 Units into the skin daily. 04/26/21  Yes Shamleffer, Konrad Dolores, MD  Insulin Pen Needle 32G X 4  MM MISC 1 Device by Does not apply route in the morning, at noon, in the evening, and at bedtime. 04/26/21  Yes Shamleffer, Konrad Dolores, MD  levofloxacin (LEVAQUIN) 500 MG tablet Take 1 tablet (500 mg total) by mouth daily. 07/27/22  Yes Becky Augusta, NP  lidocaine-prilocaine (EMLA) cream Apply 1 Application topically as needed. Apply 1 tsp over port site at least 45 minutes prior to lab appointment.Do not rub in cream. Cover with plastic wrap. 08/29/21  Yes Heilingoetter, Cassandra L, PA-C  losartan (COZAAR) 50 MG tablet TAKE ONE TABLET BY MOUTH EVERY DAY FOR HYPERTENSION 05/23/21  Yes [provider]  Multiple Vitamin (MULTIVITAMIN) tablet Take 1 tablet by mouth 2 (two) times a week.   Yes [provider]  predniSONE (DELTASONE) 20 MG tablet Take 3 tablets (60 mg total) by mouth daily with breakfast for 3 days. 3 tablets daily for 5 days. 07/27/22 07/30/22 Yes Becky Augusta, NP  Spacer/Aero-Holding Deretha Emory (AEROCHAMBER MV)  inhaler Use as instructed 06/09/22  Yes Domenick Gong, MD  vitamin B-12 (CYANOCOBALAMIN) 500 MCG tablet Take 500 mcg by mouth daily. 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 Negative for fever.  Respiratory:  Positive for cough and shortness of breath. Negative for wheezing.      Physical Exam Triage Vital Signs ED Triage Vitals  Encounter Vitals Group     BP 07/27/22 1150 108/68     Systolic BP Percentile --      Diastolic BP Percentile --      Pulse Rate 07/27/22 1150 90     Resp 07/27/22 1150 16     Temp 07/27/22 1150 98.1 F (36.7 C)     Temp Source 07/27/22 1150 Oral     SpO2 07/27/22 1150 95 %     Weight 07/27/22 1150 121 lb (54.9 kg)     Height 07/27/22 1150 5\' 6"  (1.676 m)     Head Circumference --      Peak Flow --      Pain Score 07/27/22 1153 0     Pain Loc --      Pain Education --      Exclude from Growth Chart --    No data found.  Updated Vital Signs BP 108/68 (BP Location: Left Arm)   Pulse 90   Temp 98.1 F (36.7 C) (Oral)   Resp 16   Ht 5\' 6"  (1.676 m)   Wt 121 lb (54.9 kg)  SpO2 95%   BMI 19.53 kg/m   Visual Acuity Right Eye Distance:   Left Eye Distance:   Bilateral Distance:    Right Eye Near:   Left Eye Near:    Bilateral Near:     Physical Exam Vitals and nursing note reviewed.  Constitutional:      Appearance: Normal appearance. He is ill-appearing.  HENT:     Head: Normocephalic and atraumatic.  Cardiovascular:     Rate and Rhythm: Normal rate and regular rhythm.     Pulses: Normal pulses.     Heart sounds: Normal heart sounds. No murmur heard.    No friction rub. No gallop.  Pulmonary:     Effort: Respiratory distress present.     Breath sounds: No wheezing, rhonchi or rales.     Comments: Patient has a slightly increased work of breathing.  Lung sounds are clear to auscultation. Neurological:     Mental Status: He is alert.      UC Treatments / Results  Labs (all labs ordered are listed, but only abnormal results are displayed) Labs Reviewed - No data to display  EKG   Radiology DG Chest 2 View  Result Date: 07/27/2022 CLINICAL DATA:  Nonproductive cough and shortness of breath. EXAM: CHEST - 2 VIEW COMPARISON:  12/23/2020 and chest CT 02/12/2022 FINDINGS: Again noted is hyperinflation and emphysematous disease. Again noted are post surgical changes in the right lung. New wispy densities in the left upper lung that could represent a focus of inflammation or infection. Nodular density in the left mid lung region on the frontal view probably represents a nipple shadow based on the lateral view. Blunting at the right costophrenic angle is compatible with postsurgical changes and not significantly changed. Heart size is stable. Right jugular Port-A-Cath with the tip near the superior cavoatrial junction. No acute bone abnormality. IMPRESSION: 1. New densities in the left upper lung. Findings are nonspecific but could represent a focus of inflammation or infection. Followup PA and lateral chest X-ray is recommended in 3-4 weeks  following trial of antibiotic therapy to ensure resolution and exclude underlying malignancy. 2. Severe emphysematous disease. 3. Postsurgical changes in the right lung. Electronically Signed   By: Richarda Overlie M.D.   On: 07/27/2022 13:02    Procedures Procedures (including critical care time)  Medications Ordered in UC Medications - No data to display  Initial Impression / Assessment and Plan / UC Course  I have reviewed the triage vital signs and the nursing notes.  Pertinent labs & imaging results that were available during my care of the patient were reviewed by me and considered in my medical decision making (see chart for details).   Patient is a pleasant, though ill-appearing 72 year old male with a history of lung cancer with resection of the right upper lobe as well as wedge resection of the right middle and right lower lobe who presents for continued shortness of breath and nonproductive cough with associated weight loss and night sweats.  He is followed by oncology at the [facility] and had a CT scan on 07/22/2022 which showed groundglass opacities in the left upper lobe along with a nodular configuration that was previously known and stable.  He was advised to come back to the urgent care today for reevaluation and possibly be prescribed more antibiotics.  He has had 3 rounds of antibiotics without any improvement of his symptoms.  No fever.  He has been using his albuterol inhaler and Advair as prescribed without  improvement of symptoms.  He does have a mild increased work of breathing but he is able to speak in full sentences.  His room air oxygen saturation is 95% and his respiratory rate is 16 at triage.  I do not suspect that the patient is suspecting from a bacterial process as he has had 3 rounds of antibiotics without improvement.  He does have a history of lung cancer with previous resection and given his B symptoms of weight loss and night sweats there is concern that this is a result of his  cancer.  Note from oncology at [facility] states that they did not see any mets on the CT scan.  The patient is a type I diabetic and currently his blood sugar is running 283.  I will obtain a two-view chest to evaluate for any pneumonia or lesion with a plan to have patient follow-up with oncology.  He is on an inhaled steroid without improvement of symptoms so I may also discharge him home on a short course of prednisone to see if this improves his breathing.  Chest x-ray reveals open PowerPort present in the right upper chest with the tip resting in the caval atrial junction.  Wedge resection in the right middle lobe and right lung base present.  Questionable effusion in the left lung base.  Radiology overread is pending. Radiology impression states there are new densities in the left upper lung that are nonspecific but could represent focus of inflammation or infection.  Recommending follow-up in lateral chest in 3 to 4 weeks.  Secondary finding of severe emphysematous disease.  Postsurgical changes in the right lung.  Given that patient has had 3 rounds of antibiotics I suspect that these lesions are cancer in origin and not infectious.  Though, if patient's infection is Pseudomonas and has not been well covered by the existing antibiotic so I will do a 7-day course of Levaquin 500 mg.  I will have patient follow-up with his oncologist, continue his albuterol and Advair, and I will plan on a 3-day burst dose of prednisone 60 mg to see if this improves his breathing.   Final Clinical Impressions(s) / UC Diagnoses   Final diagnoses:  Shortness of breath     Discharge Instructions      Take the Levaquin 500 tablets once daily for 7 days for additional antibiotic therapy to treat the possible infection in your left upper lobe.  Take the prednisone 60 mg daily for 3 days to cover for inflammation and see if this helps with your breathing.  Follow-up with oncology and she will need repeat imaging  to ensure the resolution of the infection or to explore if the lesion in your left upper lobe is a new malignancy.  If you develop any increasing shortness of breath, chest pain, or continue to have weight loss and night sweats I recommend going to the ER.     ED Prescriptions     Medication Sig Dispense Auth. Provider   levofloxacin (LEVAQUIN) 500 MG tablet Take 1 tablet (500 mg total) by mouth daily. 7 tablet Becky Augusta, NP   predniSONE (DELTASONE) 20 MG tablet Take 3 tablets (60 mg total) by mouth daily with breakfast for 3 days. 3 tablets daily for 5 days. 9 tablet Becky Augusta, NP      PDMP not reviewed this encounter.   Becky Augusta, NP 07/27/22 1312

## 2022-07-27 NOTE — Discharge Instructions (Addendum)
Take the Levaquin 500 tablets once daily for 7 days for additional antibiotic therapy to treat the possible infection in your left upper lobe.  Take the prednisone 60 mg daily for 3 days to cover for inflammation and see if this helps with your breathing.  Follow-up with oncology and she will need repeat imaging to ensure the resolution of the infection or to explore if the lesion in your left upper lobe is a new malignancy.  If you develop any increasing shortness of breath, chest pain, or continue to have weight loss and night sweats I recommend going to the ER.

## 2022-07-27 NOTE — ED Triage Notes (Signed)
Pt presents to UC stating he was there a couple weeks ago for bronchitis.  [facility] visit CT scan showed ground glass opacities..no lung mets but Pneumonia .Oncology just sent message to have COVID test done and more drugs...breathing has changed last 2 wks was reason for [facility] visit. - Entered by patient

## 2022-07-29 ENCOUNTER — Encounter: Payer: Self-pay | Admitting: Internal Medicine

## 2022-08-08 ENCOUNTER — Telehealth: Payer: Self-pay | Admitting: Medical Oncology

## 2022-08-08 ENCOUNTER — Encounter: Payer: Self-pay | Admitting: Internal Medicine

## 2022-08-08 NOTE — Telephone Encounter (Signed)
LVM to confirm appts tomorrow.

## 2022-08-08 NOTE — Telephone Encounter (Signed)
err

## 2022-08-09 ENCOUNTER — Ambulatory Visit (HOSPITAL_COMMUNITY)
Admission: RE | Admit: 2022-08-09 | Discharge: 2022-08-09 | Disposition: A | Discharge status: Home / Self Care | Payer: Medicare Other | Source: Ambulatory Visit | Attending: Internal Medicine | Admitting: Internal Medicine

## 2022-08-09 ENCOUNTER — Other Ambulatory Visit: Payer: Self-pay

## 2022-08-09 ENCOUNTER — Inpatient Hospital Stay: Payer: Medicare Other | Attending: Internal Medicine

## 2022-08-09 ENCOUNTER — Inpatient Hospital Stay: Payer: Medicare Other

## 2022-08-09 DIAGNOSIS — C3490 Malignant neoplasm of unspecified part of unspecified bronchus or lung: Secondary | ICD-10-CM

## 2022-08-09 DIAGNOSIS — Z5111 Encounter for antineoplastic chemotherapy: Secondary | ICD-10-CM

## 2022-08-09 DIAGNOSIS — Z79899 Other long term (current) drug therapy: Secondary | ICD-10-CM | POA: Insufficient documentation

## 2022-08-09 DIAGNOSIS — Z85118 Personal history of other malignant neoplasm of bronchus and lung: Secondary | ICD-10-CM | POA: Insufficient documentation

## 2022-08-09 DIAGNOSIS — K5790 Diverticulosis of intestine, part unspecified, without perforation or abscess without bleeding: Secondary | ICD-10-CM | POA: Diagnosis not present

## 2022-08-09 DIAGNOSIS — Z902 Acquired absence of lung [part of]: Secondary | ICD-10-CM | POA: Insufficient documentation

## 2022-08-09 DIAGNOSIS — Z9221 Personal history of antineoplastic chemotherapy: Secondary | ICD-10-CM | POA: Insufficient documentation

## 2022-08-09 DIAGNOSIS — Z7951 Long term (current) use of inhaled steroids: Secondary | ICD-10-CM | POA: Insufficient documentation

## 2022-08-09 DIAGNOSIS — N281 Cyst of kidney, acquired: Secondary | ICD-10-CM | POA: Diagnosis not present

## 2022-08-09 DIAGNOSIS — Z794 Long term (current) use of insulin: Secondary | ICD-10-CM | POA: Insufficient documentation

## 2022-08-09 DIAGNOSIS — E109 Type 1 diabetes mellitus without complications: Secondary | ICD-10-CM | POA: Insufficient documentation

## 2022-08-09 DIAGNOSIS — Z95828 Presence of other vascular implants and grafts: Secondary | ICD-10-CM

## 2022-08-09 LAB — CMP (CANCER CENTER ONLY)
ALT: 12 U/L (ref 0–44)
AST: 15 U/L (ref 15–41)
Albumin: 3.8 g/dL (ref 3.5–5.0)
Alkaline Phosphatase: 65 U/L (ref 38–126)
Anion gap: 5 (ref 5–15)
BUN: 18 mg/dL (ref 8–23)
CO2: 30 mmol/L (ref 22–32)
Calcium: 9.1 mg/dL (ref 8.9–10.3)
Chloride: 101 mmol/L (ref 98–111)
Creatinine: 1.15 mg/dL (ref 0.61–1.24)
GFR, Estimated: >60 mL/min (ref >60)
Glucose, Bld: 166 mg/dL — ABNORMAL HIGH (ref 70–99)
Potassium: 4.3 mmol/L (ref 3.5–5.1)
Sodium: 136 mmol/L (ref 135–145)
Total Bilirubin: 0.7 mg/dL (ref 0.3–1.2)
Total Protein: 5.8 g/dL — ABNORMAL LOW (ref 6.5–8.1)

## 2022-08-09 LAB — CBC WITH DIFFERENTIAL (CANCER CENTER ONLY)
Abs Immature Granulocytes: 0.04 10*3/uL (ref 0.00–0.07)
Basophils Absolute: 0.1 10*3/uL (ref 0.0–0.1)
Basophils Relative: 1 %
Eosinophils Absolute: 0.3 10*3/uL (ref 0.0–0.5)
Eosinophils Relative: 3 %
HCT: 42.2 % (ref 39.0–52.0)
Hemoglobin: 15.1 g/dL (ref 13.0–17.0)
Immature Granulocytes: 1 %
Lymphocytes Relative: 15 %
Lymphs Abs: 1.2 10*3/uL (ref 0.7–4.0)
MCH: 31.5 pg (ref 26.0–34.0)
MCHC: 35.8 g/dL (ref 30.0–36.0)
MCV: 88.1 fL (ref 80.0–100.0)
Monocytes Absolute: 0.6 10*3/uL (ref 0.1–1.0)
Monocytes Relative: 7 %
Neutro Abs: 5.7 10*3/uL (ref 1.7–7.7)
Neutrophils Relative %: 73 %
Platelet Count: 189 10*3/uL (ref 150–400)
RBC: 4.79 MIL/uL (ref 4.22–5.81)
RDW: 12.3 % (ref 11.5–15.5)
WBC Count: 7.8 10*3/uL (ref 4.0–10.5)
nRBC: 0 % (ref 0.0–0.2)

## 2022-08-09 MED ORDER — SODIUM CHLORIDE (PF) 0.9 % IJ SOLN
INTRAMUSCULAR | Status: AC
  Filled 2022-08-09: qty 50

## 2022-08-09 MED ORDER — SODIUM CHLORIDE 0.9% FLUSH
10.0000 mL | INTRAVENOUS | Status: DC | PRN
  Administered 2022-08-09: 10 mL

## 2022-08-09 MED ORDER — IOHEXOL 300 MG/ML  SOLN
80.0000 mL | Freq: Once | INTRAMUSCULAR | Status: AC | PRN
  Administered 2022-08-09: 80 mL via INTRAVENOUS

## 2022-08-09 MED ORDER — HEPARIN SOD (PORK) LOCK FLUSH 100 UNIT/ML IV SOLN
INTRAVENOUS | Status: AC
  Filled 2022-08-09: qty 5

## 2022-08-09 MED ORDER — HEPARIN SOD (PORK) LOCK FLUSH 100 UNIT/ML IV SOLN
500.0000 [IU] | Freq: Once | INTRAVENOUS | Status: AC
  Administered 2022-08-09: 500 [IU] via INTRAVENOUS

## 2022-08-13 ENCOUNTER — Inpatient Hospital Stay: Payer: Medicare Other | Admitting: Internal Medicine

## 2022-08-13 VITALS — BP 105/71 | HR 90 | Temp 97.7°F | Resp 18 | Wt 124.9 lb

## 2022-08-13 DIAGNOSIS — Z79899 Other long term (current) drug therapy: Secondary | ICD-10-CM | POA: Diagnosis not present

## 2022-08-13 DIAGNOSIS — Z9221 Personal history of antineoplastic chemotherapy: Secondary | ICD-10-CM | POA: Diagnosis not present

## 2022-08-13 DIAGNOSIS — C3490 Malignant neoplasm of unspecified part of unspecified bronchus or lung: Secondary | ICD-10-CM

## 2022-08-13 DIAGNOSIS — Z7951 Long term (current) use of inhaled steroids: Secondary | ICD-10-CM | POA: Diagnosis not present

## 2022-08-13 DIAGNOSIS — Z794 Long term (current) use of insulin: Secondary | ICD-10-CM | POA: Diagnosis not present

## 2022-08-13 DIAGNOSIS — Z902 Acquired absence of lung [part of]: Secondary | ICD-10-CM | POA: Diagnosis not present

## 2022-08-13 DIAGNOSIS — E109 Type 1 diabetes mellitus without complications: Secondary | ICD-10-CM | POA: Diagnosis not present

## 2022-08-13 DIAGNOSIS — Z85118 Personal history of other malignant neoplasm of bronchus and lung: Secondary | ICD-10-CM | POA: Diagnosis not present

## 2022-08-13 NOTE — Progress Notes (Signed)
[facility] [phone]   [phone]  OFFICE PROGRESS NOTE  Center, [facility] [address] [place] [address]  DIAGNOSIS:  1) Metastatic non-small cell lung cancer initially diagnosed as stage IIB (T1b, N1, M0) non-small cell lung cancer, invasive poorly differentiated carcinoma diagnosed in June 2021 with disease recurrence in November 2021 Molecular studies are negative for EGFR mutation as well as ALK gene translocation.  PD-L1 expression was 50-100%.  This is based on the report from the [employer] trial. Molecular studies by [employer]: Showed no actionable mutations and PD-L1 expression was 90% 2) immunotherapy induced type 1 diabetes mellitus.  PRIOR THERAPY:  1) Status post right upper lobectomy with lymph node dissection on June 17, 2019 under the care of Dr. Dorris Fetch. 2) Adjuvant systemic chemotherapy with cisplatin 75 mg/M2 and Alimta 500 mg/M2 every 3 weeks.  First dose September 02, 2019. Status post 3 cycles.  His treatment was discontinued secondary to intolerance. 3) First-line treatment with immunotherapy with Libtayo (Cempilimab) 350 mg IV every 3 weeks status post 35 cycles.  CURRENT THERAPY: Observation  INTERVAL HISTORY: Luis [name] 72 y.o. male returns to the clinic today for follow-up visit accompanied by his wife.  The patient is feeling fine today with no concerning complaints except for the mild fatigue.  [facility]  He was treated initially with Augmentin and Z-Pak with no improvement.  He was treated again with Levaquin and felt much better.  He had CT scan of the chest at that time that showed groundglass opacities but no clear evidence for disease progression.  He continues to have mild shortness of breath but no significant chest pain or hemoptysis.  He has no nausea, vomiting, diarrhea or constipation.  He is here today for evaluation with repeat CT scan of the  abdomen and pelvis to complete the staging workup.  MEDICAL HISTORY: Past Medical History:  Diagnosis Date   Arthritis    through out body   Chronic bronchitis (HCC)    Concussion    COPD (chronic obstructive pulmonary disease) (HCC)    Diabetes mellitus without complication (HCC)    Dyspnea    Emphysema lung (HCC)    Fatigue    Frozen shoulder    LEFT   GERD (gastroesophageal reflux disease)    Hard of hearing    Headache    alot of days, related to spine issues   Hypertension    Lumbar radiculopathy 2013   nscl ca 04/2019   Peyronie's disease    Prostatitis 2011   Dr. Patsi Sears   RLS (restless legs syndrome)    Shingles 06/13/2018   shingles on back, finished with prednisone, stills has scabs and pain   Thigh pain    Bilateral Thigh   Thigh pain 10/2011   bilateral   Tobacco abuse 2010   still uses electronic cig/ emphysema, SOB easily   Vertigo    per wifes update   Wears partial dentures    upper    ALLERGIES:  is allergic to ativan [lorazepam], doxycycline, and tramadol.  MEDICATIONS:  Current Outpatient Medications  Medication Sig Dispense Refill   acetaminophen (TYLENOL) 500 MG tablet Take 1,000 mg by mouth at bedtime as needed for moderate pain.     albuterol (VENTOLIN HFA) 108 (90 Base) MCG/ACT inhaler INHALE 1-2 PUFFS BY ORAL INHALATION EVERY 4 HOURS FOR CHRONIC OBSTRUCTIVE LUNG DISEASE BE SURE TO WASH MOUTHPIECE  WITH WARM WATER ONCE A WEEK     amLODipine (NORVASC) 5 MG tablet TAKE ONE-HALF TABLET BY MOUTH EVERY DAY FOR BLOOD PRESSURE. NOTE NEW MEDICINE     amoxicillin-clavulanate (AUGMENTIN) 875-125 MG tablet Take 1 tablet by mouth every 12 (twelve) hours. 14 tablet 0   b complex vitamins capsule Take 1 capsule by mouth daily.     Cholecalciferol (VITAMIN D) 125 MCG (5000 UT) CAPS Take 5,000 Units by mouth daily.     citalopram (CELEXA) 20 MG tablet Take 1 tablet by mouth daily.     Continuous Blood Gluc Receiver (DEXCOM G7 RECEIVER) DEVI by Does not  apply route.     Continuous Blood Gluc Sensor (DEXCOM G7 SENSOR) MISC by Does not apply route.     fluticasone (FLONASE) 50 MCG/ACT nasal spray Place 2 sprays into both nostrils daily. 16 g 0   fluticasone-salmeterol (ADVAIR) 250-50 MCG/ACT AEPB 1 puff     glucose blood (ACCU-CHEK GUIDE) test strip See admin instructions.     insulin aspart (NOVOLOG FLEXPEN) 100 UNIT/ML FlexPen Max daily 30 units 15 mL 11   insulin glargine (LANTUS SOLOSTAR) 100 UNIT/ML Solostar Pen Inject 10 Units into the skin daily. (Patient taking differently: Inject 8 Units into the skin daily.) 15 mL 6   Insulin Pen Needle 32G X 4 MM MISC 1 Device by Does not apply route in the morning, at noon, in the evening, and at bedtime. 400 each 3   levofloxacin (LEVAQUIN) 500 MG tablet Take 1 tablet (500 mg total) by mouth daily. 7 tablet 0   lidocaine-prilocaine (EMLA) cream Apply 1 Application topically as needed. Apply 1 tsp over port site at least 45 minutes prior to lab appointment.Do not rub in cream. Cover with plastic wrap. 30 g 5   losartan (COZAAR) 50 MG tablet TAKE ONE TABLET BY MOUTH EVERY DAY FOR HYPERTENSION     Multiple Vitamin (MULTIVITAMIN) tablet Take 1 tablet by mouth 2 (two) times a week.     Spacer/Aero-Holding Chambers (AEROCHAMBER MV) inhaler Use as instructed 1 each 1   vitamin B-12 (CYANOCOBALAMIN) 500 MCG tablet Take 500 mcg by mouth daily.     No current facility-administered medications for this visit.    SURGICAL HISTORY:  Past Surgical History:  Procedure Laterality Date   CATARACT EXTRACTION W/PHACO Right 07/08/2018   Procedure: CATARACT EXTRACTION PHACO AND INTRAOCULAR LENS PLACEMENT (IOC) RIGHT;  Surgeon: Lockie Mola, MD;  Location: [facility];  Service: Ophthalmology;  Laterality: Right;   CATARACT EXTRACTION W/PHACO Left 07/29/2018   Procedure: CATARACT EXTRACTION PHACO AND INTRAOCULAR LENS PLACEMENT (IOC) LEFT TORIC LENS;  Surgeon: Lockie Mola, MD;  Location: [facility];  Service: Ophthalmology;  Laterality: Left;   CHEST TUBE INSERTION Right 07/01/2019   Procedure: CHEST TUBE INSERTION;  Surgeon: Loreli Slot, MD;  Location: [facility] OR;  Service: Thoracic;  Laterality: Right;   COLONOSCOPY     EYE SURGERY     INTERCOSTAL NERVE BLOCK Right 06/17/2019   Procedure: INTERCOSTAL NERVE BLOCK;  Surgeon: Loreli Slot, MD;  Location: [facility] OR;  Service: Thoracic;  Laterality: Right;   INTERCOSTAL NERVE BLOCK Right 07/01/2019   Procedure: INTERCOSTAL NERVE BLOCK;  Surgeon: Loreli Slot, MD;  Location: [facility] OR;  Service: Thoracic;  Laterality: Right;   IR IMAGING GUIDED PORT INSERTION  02/21/2020   TONSILLECTOMY     VIDEO ASSISTED THORACOSCOPY Right 07/01/2019   Procedure: VIDEO ASSISTED THORACOSCOPY - REMOVAL OF RIGHT MIDDLE LOBE BLEBS;  Surgeon: Dorris Fetch,  Salvatore Decent, MD;  Location: [facility] OR;  Service: Thoracic;  Laterality: Right;   VIDEO BRONCHOSCOPY WITH INSERTION OF INTERBRONCHIAL VALVE (IBV) Right 06/25/2019   Procedure: VIDEO BRONCHOSCOPY WITH INSERTION OF INTERBRONCHIAL VALVE (IBV); Size 7 IBV into Right Loer Lobe (RLL) Superior Segment, Size 9 IBV into RLL Basilar Segment;  Surgeon: Loreli Slot, MD;  Location: [facility] OR;  Service: Thoracic;  Laterality: Right;   VIDEO BRONCHOSCOPY WITH INSERTION OF INTERBRONCHIAL VALVE (IBV) N/A 08/06/2019   Procedure: VIDEO BRONCHOSCOPY WITH REMOVAL OF INTERBRONCHIAL VALVE (IBV);  Surgeon: Loreli Slot, MD;  Location: [facility] OR;  Service: Thoracic;  Laterality: N/A;    REVIEW OF SYSTEMS:  A comprehensive review of systems was negative except for: Constitutional: positive for fatigue Respiratory: positive for dyspnea on exertion   PHYSICAL EXAMINATION: General appearance: alert, cooperative, fatigued, and no distress Head: Normocephalic, without obvious abnormality, atraumatic Neck: no adenopathy, no JVD, supple, symmetrical, trachea midline, and thyroid not enlarged, symmetric, no  tenderness/mass/nodules Lymph nodes: Cervical, supraclavicular, and axillary nodes normal. Resp: clear to auscultation bilaterally Back: symmetric, no curvature. ROM normal. No CVA tenderness. Cardio: regular rate and rhythm, S1, S2 normal, no murmur, click, rub or gallop GI: soft, non-tender; bowel sounds normal; no masses,  no organomegaly Extremities: extremities normal, atraumatic, no cyanosis or edema  ECOG PERFORMANCE STATUS: 1 - Symptomatic but completely ambulatory  Blood pressure 105/71, pulse 90, temperature 97.7 F (36.5 C), temperature source Temporal, resp. rate 18, weight 124 lb 14.4 oz (56.7 kg), SpO2 95%.  LABORATORY DATA: Lab Results  Component Value Date   WBC 7.8 08/09/2022   HGB 15.1 08/09/2022   HCT 42.2 08/09/2022   MCV 88.1 08/09/2022   PLT 189 08/09/2022      Chemistry      Component Value Date/Time   NA 136 08/09/2022 1055   K 4.3 08/09/2022 1055   CL 101 08/09/2022 1055   CO2 30 08/09/2022 1055   BUN 18 08/09/2022 1055   CREATININE 1.15 08/09/2022 1055      Component Value Date/Time   CALCIUM 9.1 08/09/2022 1055   ALKPHOS 65 08/09/2022 1055   AST 15 08/09/2022 1055   ALT 12 08/09/2022 1055   BILITOT 0.7 08/09/2022 1055       RADIOGRAPHIC STUDIES: CT Abdomen Pelvis W Contrast  Result Date: 08/12/2022 CLINICAL DATA:  Non-small cell lung cancer staging; * Tracking Code: BO * EXAM: CT ABDOMEN AND PELVIS WITH CONTRAST TECHNIQUE: Multidetector CT imaging of the abdomen and pelvis was performed using the standard protocol following bolus administration of intravenous contrast. RADIATION DOSE REDUCTION: This exam was performed according to the departmental dose-optimization program which includes automated exposure control, adjustment of the mA and/or kV according to patient size and/or use of iterative reconstruction technique. CONTRAST:  80mL OMNIPAQUE IOHEXOL 300 MG/ML  SOLN COMPARISON:  CT chest, abdomen and pelvis dated May 10, 2022 FINDINGS:  Lower chest: Emphysema. Trace right pleural effusion and right pleural thickening, unchanged when compared with prior exam. Hepatobiliary: Stable scattered low-attenuation liver lesions which are likely simple cysts. No suspicious liver lesion. Gallbladder is unremarkable. No biliary ductal dilation. Pancreas: Unremarkable. No pancreatic ductal dilatation or surrounding inflammatory changes. Spleen: Splenic calcifications, likely sequela of prior granulomatous infection. Adrenals/Urinary Tract: Bilateral adrenal glands are unremarkable. No hydronephrosis or nephrolithiasis. Unchanged bilateral renal cysts. Bladder is unremarkable. Stomach/Bowel: Stomach is within normal limits. Appendix appears normal. Diverticulosis. No evidence of bowel wall thickening, distention, or inflammatory changes. Vascular/Lymphatic: Aortic atherosclerosis. No enlarged abdominal or pelvic  lymph nodes. Reproductive: Prostatomegaly Other: No abdominal wall hernia or abnormality. No abdominopelvic ascites. Musculoskeletal: No aggressive appearing osseous lesions. IMPRESSION: 1. No evidence of metastatic disease in the abdomen or pelvis. 2. Aortic Atherosclerosis (ICD10-I70.0) and Emphysema (ICD10-J43.9). Electronically Signed   By: Allegra Lai M.D.   On: 08/12/2022 16:22   DG Chest 2 View  Result Date: 07/27/2022 CLINICAL DATA:  Nonproductive cough and shortness of breath. EXAM: CHEST - 2 VIEW COMPARISON:  12/23/2020 and chest CT 02/12/2022 FINDINGS: Again noted is hyperinflation and emphysematous disease. Again noted are post surgical changes in the right lung. New wispy densities in the left upper lung that could represent a focus of inflammation or infection. Nodular density in the left mid lung region on the frontal view probably represents a nipple shadow based on the lateral view. Blunting at the right costophrenic angle is compatible with postsurgical changes and not significantly changed. Heart size is stable. Right jugular  Port-A-Cath with the tip near the superior cavoatrial junction. No acute bone abnormality. IMPRESSION: 1. New densities in the left upper lung. Findings are nonspecific but could represent a focus of inflammation or infection. Followup PA and lateral chest X-ray is recommended in 3-4 weeks following trial of antibiotic therapy to ensure resolution and exclude underlying malignancy. 2. Severe emphysematous disease. 3. Postsurgical changes in the right lung. Electronically Signed   By: Richarda Overlie M.D.   On: 07/27/2022 13:02    ASSESSMENT AND PLAN: This is a very pleasant 72 years old white male recently diagnosed with stage IIb (T1b, N1, M0) non-small cell lung cancer, adenocarcinoma status post right upper lobectomy with lymph node dissection under the care of Dr. Dorris Fetch on June 17, 2019. The patient had molecular studies on the [employer] clinical trial that showed negative for EGFR mutation as well as ALK gene translocation but PD-L1 expression in the range of 50-100%. The patient underwent standard adjuvant systemic chemotherapy with cisplatin 75 mg/M2 and Alimta 500 mg/M2 every 3 weeks status post 3 cycles.  His treatment was discontinued secondary to intolerance. Unfortunately repeat CT scan as well as PET scan after completion of the adjuvant chemotherapy showed concerning findings for disease recurrence with small right pleural effusion as well as multiple pleural implants consistent with metastatic disease which was confirmed with repeat biopsy. He had molecular studies by [employer] and the blood test showed no actionable mutation.  His PD-L1 expression was 90%. The patient underwent treatment with immunotherapy with Libtayo (Cempilimab) 350 mg IV every 3 weeks status post 35 cycles. He tolerated his treatment fairly well with no concerning adverse effects. The patient is currently on observation and he is feeling fine with no concerning complaints except for the mild residual shortness of  breath after his diagnosis with pneumonia few weeks ago. He had CT scan of the abdomen and pelvis to complete the staging workup. I personally and independently reviewed the scan and discussed the result with the patient today. His scan showed no concerning findings for disease metastasis. I recommended for him to continue on observation with repeat CT scan of the chest, abdomen and pelvis in 3 months. For the immunotherapy mediated type 1 diabetes mellitus, he is followed by endocrinology and currently on treatment with insulin. He will continue to have Port-A-Cath flush every 6 weeks. The patient was advised to call immediately if he has any other concerning symptoms in the interval.  The patient voices understanding of current disease status and treatment options and is in agreement with  the current care plan.  All questions were answered. The patient knows to call the clinic with any problems, questions or concerns. We can certainly see the patient much sooner if necessary.  Disclaimer: This note was dictated with voice recognition software. Similar sounding words can inadvertently be transcribed and may not be corrected upon review.

## 2022-08-24 ENCOUNTER — Encounter: Payer: Self-pay | Admitting: Internal Medicine

## 2022-09-23 ENCOUNTER — Encounter: Payer: Self-pay | Admitting: Internal Medicine

## 2022-09-24 ENCOUNTER — Inpatient Hospital Stay: Payer: Medicare Other

## 2022-09-27 ENCOUNTER — Telehealth: Payer: Self-pay | Admitting: Radiation Oncology

## 2022-09-27 NOTE — Telephone Encounter (Signed)
Sent MRR from [facility] [place] to dosimetry 9/13 with 2 emails for DICOM records

## 2022-10-07 ENCOUNTER — Encounter: Payer: Self-pay | Admitting: Internal Medicine

## 2022-10-08 ENCOUNTER — Other Ambulatory Visit: Payer: Self-pay | Admitting: Physician Assistant

## 2022-10-15 DEATH — deceased

## 2022-11-06 ENCOUNTER — Other Ambulatory Visit: Payer: Medicare Other

## 2022-11-12 ENCOUNTER — Ambulatory Visit: Payer: Medicare Other | Admitting: Internal Medicine

## 2022-12-04 NOTE — Telephone Encounter (Signed)
Telephone call
